# Patient Record
Sex: Female | Born: 1935 | Race: Black or African American | Hispanic: No | Marital: Married | Smoking: Never smoker
Health system: Southern US, Community
[De-identification: ages and names within clinical notes are randomized; demographics above are authoritative.]

## PROBLEM LIST (undated history)

## (undated) DIAGNOSIS — D649 Anemia, unspecified: Secondary | ICD-10-CM

## (undated) DIAGNOSIS — I10 Essential (primary) hypertension: Secondary | ICD-10-CM

## (undated) DIAGNOSIS — N289 Disorder of kidney and ureter, unspecified: Secondary | ICD-10-CM

## (undated) DIAGNOSIS — K219 Gastro-esophageal reflux disease without esophagitis: Secondary | ICD-10-CM

## (undated) DIAGNOSIS — E7800 Pure hypercholesterolemia, unspecified: Secondary | ICD-10-CM

## (undated) HISTORY — PX: TUBAL LIGATION: SHX77

## (undated) HISTORY — DX: Pure hypercholesterolemia, unspecified: E78.00

## (undated) HISTORY — DX: Anemia, unspecified: D64.9

## (undated) HISTORY — PX: TONSILLECTOMY: SUR1361

## (undated) HISTORY — DX: Gastro-esophageal reflux disease without esophagitis: K21.9

## (undated) HISTORY — DX: Disorder of kidney and ureter, unspecified: N28.9

---

## 2009-07-24 ENCOUNTER — Ambulatory Visit: Payer: Self-pay | Admitting: Family Medicine

## 2015-02-02 ENCOUNTER — Encounter: Payer: Self-pay | Admitting: Emergency Medicine

## 2015-02-02 ENCOUNTER — Ambulatory Visit
Admission: EM | Admit: 2015-02-02 | Discharge: 2015-02-02 | Disposition: A | Discharge status: Home / Self Care | Payer: Medicare HMO | Attending: Internal Medicine | Admitting: Internal Medicine

## 2015-02-02 DIAGNOSIS — B372 Candidiasis of skin and nail: Secondary | ICD-10-CM

## 2015-02-02 DIAGNOSIS — J069 Acute upper respiratory infection, unspecified: Secondary | ICD-10-CM | POA: Diagnosis not present

## 2015-02-02 HISTORY — DX: Essential (primary) hypertension: I10

## 2015-02-02 MED ORDER — CLOTRIMAZOLE 1 % EX CREA: TOPICAL_CREAM | CUTANEOUS | Status: DC

## 2015-02-02 MED ORDER — TRIAMCINOLONE ACETONIDE 55 MCG/ACT NA AERO: 2.0000 | INHALATION_SPRAY | Freq: Every day | NASAL | Status: DC

## 2015-02-02 NOTE — Discharge Instructions (Signed)
Prescription for nasacort spray (to help with congestion) and clotrimazole cream (to help with itching/redness under R breast) [facility] [place]. Cough should slowly improve over the next 2 weeks or so. Please be rechecked for new fever >100.5, increasing phlegm production. Rest and push fluids.    Upper Respiratory Infection, Adult An upper respiratory infection (URI) is also known as the common cold. It is often caused by a type of germ (virus). Colds are easily spread (contagious). You can pass it to others by kissing, coughing, sneezing, or drinking out of the same glass. Usually, you get better in 1 or 2 weeks.  HOME CARE   Only take medicine as told by your doctor.  Use a warm mist humidifier or breathe in steam from a hot shower.  Drink enough water and fluids to keep your pee (urine) clear or pale yellow.  Get plenty of rest.  Return to work when your temperature is back to normal or as told by your doctor. You may use a face mask and wash your hands to stop your cold from spreading. GET HELP RIGHT AWAY IF:   After the first few days, you feel you are getting worse.  You have questions about your medicine.  You have chills, shortness of breath, or brown or red spit (mucus).  You have yellow or brown snot (nasal discharge) or pain in the face, especially when you bend forward.  You have a fever, puffy (swollen) neck, pain when you swallow, or white spots in the back of your throat.  You have a bad headache, ear pain, sinus pain, or chest pain.  You have a high-pitched whistling sound when you breathe in and out (wheezing).  You have a lasting cough or cough up blood.  You have sore muscles or a stiff neck. MAKE SURE YOU:   Understand these instructions.  Will watch your condition.  Will get help right away if you are not doing well or get worse. Document Released: 12/31/2007 Document Revised: 10/06/2011 Document Reviewed: 10/19/2013 [facility] Patient  Information 2015 [place], [place]. This information is not intended to replace advice given to you by your health care provider. Make sure you discuss any questions you have with your health care provider.

## 2015-02-02 NOTE — ED Notes (Signed)
Patient c/o cough and chest congestion since Monday.  

## 2015-02-02 NOTE — ED Provider Notes (Signed)
CSN: [id number]     Arrival date & time 02/02/15  5993 History   First MD Initiated Contact with Patient 02/02/15 [phone]     Chief Complaint  Patient presents with  . Cough   HPI  Patient is a 81 year old lady who presents with a 4 day history of cough, runny/congested nose. Cough is occasionally productive, and seems to be improving. She is taking Delsym cough syrup. She has malaise. No nausea, no vomiting. No diarrhea. Not achy. Just doesn't feel good. No smoking history.  Past Medical History  Diagnosis Date  . Hypertension    Past Surgical History  Procedure Laterality Date  . Tubal ligation    . Tonsillectomy     History reviewed. No pertinent family history. History  Substance Use Topics  . Smoking status: Never Smoker   . Smokeless tobacco: Never Used  . Alcohol Use: No    Review of Systems  All other systems reviewed and are negative.   Allergies  Review of patient's allergies indicates no known allergies.  Home Medications   Prior to Admission medications   Medication Sig Start Date End Date Taking? Authorizing Provider  clonazePAM (KLONOPIN) 0.5 MG tablet Take 0.5 mg by mouth 2 (two) times daily as needed for anxiety.   Yes Historical Provider, MD  fenofibrate (TRICOR) 48 MG tablet Take 48 mg by mouth daily.   Yes Historical Provider, MD  hydrochlorothiazide (MICROZIDE) 12.5 MG capsule Take 12.5 mg by mouth daily.   Yes Historical Provider, MD  lisinopril (PRINIVIL,ZESTRIL) 10 MG tablet Take 10 mg by mouth daily.   Yes Historical Provider, MD  Multiple Vitamin (MULTIVITAMIN) tablet Take 1 tablet by mouth daily.   Yes Historical Provider, MD                 BP 159/79 mmHg  Pulse 82  Temp(Src) 96.9 F (36.1 C) (Tympanic)  Resp 16  Ht 5\' 3"  (1.6 m)  Wt 147 lb (66.679 kg)  BMI 26.05 kg/m2  SpO2 100% Physical Exam  Constitutional: She is oriented to person, place, and time. No distress.  Alert, nicely groomed  HENT:  Head: Atraumatic.  Bilateral TMs are  moderately dull, right ear canal is waxy. Marked nasal congestion on the left, and moderate congestion on the right. Throat is little bit red, with postnasal drainage evident  Eyes:  Conjugate gaze, no eye redness/drainage   Neck: Neck supple.  Cardiovascular: Normal rate and regular rhythm.   Pulmonary/Chest: Effort normal. No respiratory distress. She has no wheezes. She has no rales.  Breath sounds slightly coarse, symmetric breath sounds  Abdominal: She exhibits no distension.  Musculoskeletal: Normal range of motion.  No leg swelling  Neurological: She is alert and oriented to person, place, and time.  Skin: Skin is warm and dry.  No cyanosis Patient calls to my attention an itchy red patch, slightly indurated, about 4 x 6", under the right breast. She says it's been there a couple of weeks, and has been expanding since she started using hydrocortisone cream on it. It is not sore. There are some tiny satellite papules, no vesicles.  Nursing note and vitals reviewed.   ED Course  Procedures  No procedures at the urgent care today  MDM   1. Upper respiratory infection   2. Candidal intertrigo    Continue Delsym. [facility] [place] Anticipate gradual improvement in the cough over the next couple of weeks, patient to recheck if she  is having increasing phlegm production, or new fever greater than 100.5. Prescription for clotrimazole cream was sent to the pharmacy for Candida under the right breast.    Sherlene Shams, MD 02/02/15 [phone]

## 2015-11-17 DIAGNOSIS — F419 Anxiety disorder, unspecified: Secondary | ICD-10-CM | POA: Insufficient documentation

## 2015-11-17 DIAGNOSIS — E7800 Pure hypercholesterolemia, unspecified: Secondary | ICD-10-CM | POA: Insufficient documentation

## 2015-11-17 DIAGNOSIS — K219 Gastro-esophageal reflux disease without esophagitis: Secondary | ICD-10-CM | POA: Insufficient documentation

## 2015-11-17 DIAGNOSIS — R0602 Shortness of breath: Secondary | ICD-10-CM | POA: Insufficient documentation

## 2015-11-17 DIAGNOSIS — I10 Essential (primary) hypertension: Secondary | ICD-10-CM | POA: Insufficient documentation

## 2018-08-28 DIAGNOSIS — N289 Disorder of kidney and ureter, unspecified: Secondary | ICD-10-CM | POA: Insufficient documentation

## 2020-09-25 DIAGNOSIS — Z20822 Contact with and (suspected) exposure to covid-19: Secondary | ICD-10-CM | POA: Diagnosis not present

## 2020-09-25 DIAGNOSIS — Z03818 Encounter for observation for suspected exposure to other biological agents ruled out: Secondary | ICD-10-CM | POA: Diagnosis not present

## 2020-09-26 DIAGNOSIS — Z20822 Contact with and (suspected) exposure to covid-19: Secondary | ICD-10-CM | POA: Diagnosis not present

## 2020-09-26 DIAGNOSIS — Z03818 Encounter for observation for suspected exposure to other biological agents ruled out: Secondary | ICD-10-CM | POA: Diagnosis not present

## 2020-10-08 DIAGNOSIS — E538 Deficiency of other specified B group vitamins: Secondary | ICD-10-CM | POA: Diagnosis not present

## 2020-10-08 DIAGNOSIS — J309 Allergic rhinitis, unspecified: Secondary | ICD-10-CM | POA: Diagnosis not present

## 2020-10-08 DIAGNOSIS — Z0001 Encounter for general adult medical examination with abnormal findings: Secondary | ICD-10-CM | POA: Diagnosis not present

## 2020-10-08 DIAGNOSIS — N289 Disorder of kidney and ureter, unspecified: Secondary | ICD-10-CM | POA: Diagnosis not present

## 2020-10-08 DIAGNOSIS — Z13220 Encounter for screening for lipoid disorders: Secondary | ICD-10-CM | POA: Diagnosis not present

## 2020-10-08 DIAGNOSIS — E039 Hypothyroidism, unspecified: Secondary | ICD-10-CM | POA: Diagnosis not present

## 2020-10-08 DIAGNOSIS — R4589 Other symptoms and signs involving emotional state: Secondary | ICD-10-CM | POA: Diagnosis not present

## 2020-10-08 DIAGNOSIS — I10 Essential (primary) hypertension: Secondary | ICD-10-CM | POA: Diagnosis not present

## 2020-11-26 DIAGNOSIS — H40003 Preglaucoma, unspecified, bilateral: Secondary | ICD-10-CM | POA: Diagnosis not present

## 2020-11-26 DIAGNOSIS — H2513 Age-related nuclear cataract, bilateral: Secondary | ICD-10-CM | POA: Diagnosis not present

## 2021-06-24 ENCOUNTER — Ambulatory Visit (INDEPENDENT_AMBULATORY_CARE_PROVIDER_SITE_OTHER): Payer: Medicare Other | Admitting: Internal Medicine

## 2021-06-24 ENCOUNTER — Other Ambulatory Visit: Payer: Self-pay

## 2021-06-24 ENCOUNTER — Encounter: Payer: Self-pay | Admitting: Internal Medicine

## 2021-06-24 VITALS — BP 138/84 | HR 66 | Temp 97.5°F | Resp 16 | Ht 61.5 in | Wt 153.0 lb

## 2021-06-24 DIAGNOSIS — M545 Low back pain, unspecified: Secondary | ICD-10-CM

## 2021-06-24 DIAGNOSIS — E7800 Pure hypercholesterolemia, unspecified: Secondary | ICD-10-CM

## 2021-06-24 DIAGNOSIS — Z86010 Personal history of colonic polyps: Secondary | ICD-10-CM

## 2021-06-24 DIAGNOSIS — I10 Essential (primary) hypertension: Secondary | ICD-10-CM

## 2021-06-24 DIAGNOSIS — D649 Anemia, unspecified: Secondary | ICD-10-CM

## 2021-06-24 DIAGNOSIS — Z862 Personal history of diseases of the blood and blood-forming organs and certain disorders involving the immune mechanism: Secondary | ICD-10-CM | POA: Diagnosis not present

## 2021-06-24 DIAGNOSIS — Z8659 Personal history of other mental and behavioral disorders: Secondary | ICD-10-CM

## 2021-06-24 NOTE — Progress Notes (Addendum)
Patient ID: MACKAYLA MULLINS, female   DOB: 11-14-1933, 87 y.o.   MRN: [id number]   Subjective:    Patient ID: Arnaldo Natal, female    DOB: 1934-07-21, 87 y.o.   MRN: [id number]  This visit occurred during the SARS-CoV-2 public health emergency.  Safety protocols were in place, including screening questions prior to the visit, additional usage of staff PPE, and extensive cleaning of exam room while observing appropriate contact time as indicated for disinfecting solutions.   Patient here for to establish care.    Chief Complaint  Patient presents with   Establish Care   .   HPI She is accompanied by her daughter Sharyn Lull.  History obtained from both of them.  Has been seeing Dr Maeola Sarah.  Lives with another daughter.  Able to do her ADLs. Does report low back discomfort - arthritis.  No chest pain reported.  Breathing stable.  No abdominal pain.  Blood pressures 130s/80s.     Past Medical History:  Diagnosis Date   Anemia    GERD (gastroesophageal reflux disease)    Hypercholesterolemia    Hypertension    Renal insufficiency    Past Surgical History:  Procedure Laterality Date   TONSILLECTOMY     TUBAL LIGATION     Family History  Problem Relation Age of Onset   Hypertension Mother    Arthritis Mother    Social History   Socioeconomic History   Marital status: Married    Spouse name: Not on file   Number of children: Not on file   Years of education: Not on file   Highest education level: Not on file  Occupational History   Not on file  Tobacco Use   Smoking status: Never   Smokeless tobacco: Never  Substance and Sexual Activity   Alcohol use: No   Drug use: No   Sexual activity: Not on file  Other Topics Concern   Not on file  Social History Narrative   Not on file   Social Determinants of Health   Financial Resource Strain: Not on file  Food Insecurity: Not on file  Transportation Needs: Not on file  Physical Activity: Not on file  Stress: Not on file   Social Connections: Not on file     Review of Systems  Constitutional:  Negative for appetite change and unexpected weight change.  HENT:  Negative for congestion and sinus pressure.   Respiratory:  Negative for cough and chest tightness.        Breathing stable.   Cardiovascular:  Negative for chest pain, palpitations and leg swelling.  Gastrointestinal:  Negative for abdominal pain, diarrhea, nausea and vomiting.  Genitourinary:  Negative for difficulty urinating and dysuria.  Musculoskeletal:  Positive for back pain. Negative for joint swelling and myalgias.  Skin:  Negative for color change and rash.  Neurological:  Negative for dizziness, light-headedness and headaches.  Psychiatric/Behavioral:  Negative for agitation and dysphoric mood.       Objective:     BP 138/84   Pulse 66   Temp (!) 97.5 F (36.4 C)   Resp 16   Ht 5' 1.5" (1.562 m)   Wt 153 lb (69.4 kg)   SpO2 98%   BMI 28.44 kg/m  Wt Readings from Last 3 Encounters:  06/24/21 153 lb (69.4 kg)  02/02/15 147 lb (66.7 kg)    Physical Exam Vitals reviewed.  Constitutional:      General: She is not in acute distress.  Appearance: Normal appearance.  HENT:     Head: Normocephalic and atraumatic.     Right Ear: External ear normal.     Left Ear: External ear normal.  Eyes:     General: No scleral icterus.       Right eye: No discharge.        Left eye: No discharge.     Conjunctiva/sclera: Conjunctivae normal.  Neck:     Thyroid: No thyromegaly.  Cardiovascular:     Rate and Rhythm: Normal rate and regular rhythm.  Pulmonary:     Effort: No respiratory distress.     Breath sounds: Normal breath sounds. No wheezing.  Abdominal:     General: Bowel sounds are normal.     Palpations: Abdomen is soft.     Tenderness: There is no abdominal tenderness.  Musculoskeletal:        General: No swelling or tenderness.     Cervical back: Neck supple. No tenderness.  Lymphadenopathy:     Cervical: No  cervical adenopathy.  Skin:    Findings: No erythema or rash.  Neurological:     Mental Status: She is alert.  Psychiatric:        Mood and Affect: Mood normal.        Behavior: Behavior normal.     Outpatient Encounter Medications as of 06/24/2021  Medication Sig   amLODipine (NORVASC) 2.5 MG tablet Take 10 mg by mouth daily.   ascorbic acid (VITAMIN C) 500 MG tablet Take 500 mg by mouth daily.   aspirin 81 MG EC tablet Take 81 mg by mouth daily.   Calcium Carbonate-Vitamin D (OYSTER SHELL CALCIUM/D) 500-5 MG-MCG TABS Take by mouth.   clonazePAM (KLONOPIN) 0.5 MG tablet Take 0.5 mg by mouth 2 (two) times daily as needed for anxiety.   clotrimazole (LOTRIMIN) 1 % cream Apply to affected area 2 times daily   donepezil (ARICEPT) 10 MG tablet Take 10 mg by mouth daily.   fenofibrate (TRICOR) 48 MG tablet Take 48 mg by mouth daily.   ferrous gluconate (FERGON) 324 MG tablet ferrous gluconate 324 mg (38 mg iron) tablet  TAKE 1 TABLET BY MOUTH THREE TIMES A DAY AS DIRECTED   fexofenadine (ALLEGRA) 180 MG tablet Take by mouth.   hydrochlorothiazide (MICROZIDE) 12.5 MG capsule Take 12.5 mg by mouth daily.   losartan (COZAAR) 100 MG tablet Take 100 mg by mouth daily.   memantine (NAMENDA) 5 MG tablet Take 10 mg by mouth 2 (two) times daily.   metoprolol succinate (TOPROL-XL) 25 MG 24 hr tablet Take 50 mg by mouth daily.   Multiple Vitamin (MULTIVITAMIN) tablet Take 1 tablet by mouth daily.   omeprazole (PRILOSEC) 20 MG capsule Take 20 mg by mouth daily.   [DISCONTINUED] lisinopril (PRINIVIL,ZESTRIL) 10 MG tablet Take 10 mg by mouth daily.   [DISCONTINUED] triamcinolone (NASACORT AQ) 55 MCG/ACT AERO nasal inhaler Place 2 sprays into the nose daily.   No facility-administered encounter medications on file as of 06/24/2021.     No results found.     Assessment & Plan:   Problem List Items Addressed This Visit     History of anemia    Check cbc today.  Recent labs revealed decreased  hgb.        Relevant Orders   CBC with Differential/Platelet (Completed)   Basic Metabolic Panel (BMET) (Completed)   History of colon polyps    Has had colonoscopy.  States was told did not need f/u colonoscopy.  History of dementia    On aricept and namenda.  Daughter - request neurology evaluation (formal evaluation).        Relevant Orders   Ambulatory referral to Neurology   Hypercholesterolemia    On tricor.  Check lipid panel.        Relevant Medications   amLODipine (NORVASC) 2.5 MG tablet   aspirin 81 MG EC tablet   losartan (COZAAR) 100 MG tablet   metoprolol succinate (TOPROL-XL) 25 MG 24 hr tablet   Other Relevant Orders   Basic Metabolic Panel (BMET) (Completed)   Lipid panel (Completed)   TSH (Completed)   Hepatic function panel (Completed)   Hypertension, essential    Blood pressure as outlined - outside readings averaging 130s/80s.  Continue toprol, amlodipine, microzide and losartan.  Continue current medication regimen.  Follow pressures.  Follow metabolic panel.       Relevant Medications   amLODipine (NORVASC) 2.5 MG tablet   aspirin 81 MG EC tablet   losartan (COZAAR) 100 MG tablet   metoprolol succinate (TOPROL-XL) 25 MG 24 hr tablet   Other Relevant Orders   Basic Metabolic Panel (BMET) (Completed)   Low back pain    Low back pain as outlined. Appears to be worse when first gets up.  Better as moving.  Follow.       Relevant Medications   aspirin 81 MG EC tablet   Other Visit Diagnoses     Anemia, unspecified type    -  Primary   Relevant Medications   ferrous gluconate (FERGON) 324 MG tablet   Other Relevant Orders   IBC + Ferritin   Basic Metabolic Panel (BMET) (Completed)   Vitamin B12 (Completed)   Ferritin (Completed)   Iron Binding Cap (TIBC)([facility]) (Completed)        Einar Pheasant, MD

## 2021-06-25 LAB — CBC WITH DIFFERENTIAL/PLATELET
Basophils Absolute: 0 10*3/uL (ref 0.0–0.1)
Basophils Relative: 1 % (ref 0.0–3.0)
Eosinophils Absolute: 0.1 10*3/uL (ref 0.0–0.7)
Eosinophils Relative: 1.4 % (ref 0.0–5.0)
HCT: 24.4 % — ABNORMAL LOW (ref 36.0–46.0)
Hemoglobin: 8.1 g/dL — ABNORMAL LOW (ref 12.0–15.0)
Lymphocytes Relative: 31.1 % (ref 12.0–46.0)
Lymphs Abs: 1.3 10*3/uL (ref 0.7–4.0)
MCHC: 33.4 g/dL (ref 30.0–36.0)
MCV: 95.9 fl (ref 78.0–100.0)
Monocytes Absolute: 0.3 10*3/uL (ref 0.1–1.0)
Monocytes Relative: 7.5 % (ref 3.0–12.0)
Neutro Abs: 2.5 10*3/uL (ref 1.4–7.7)
Neutrophils Relative %: 59 % (ref 43.0–77.0)
Platelets: 191 10*3/uL (ref 150.0–400.0)
RBC: 2.55 Mil/uL — ABNORMAL LOW (ref 3.87–5.11)
RDW: 15.7 % — ABNORMAL HIGH (ref 11.5–15.5)
WBC: 4.2 10*3/uL (ref 4.0–10.5)

## 2021-06-26 ENCOUNTER — Telehealth: Payer: Self-pay | Admitting: *Deleted

## 2021-06-26 NOTE — Telephone Encounter (Signed)
My apologies for the late message, We received a call from [facility] lab notifying us that there was a clotting/coagulation issue with her serum & that she needs to be recollected.   I have corrected the labs so that the patient can have them drawn at her nearest outpatient [place] location tomorrow.

## 2021-06-27 ENCOUNTER — Other Ambulatory Visit
Admission: RE | Admit: 2021-06-27 | Discharge: 2021-06-27 | Disposition: A | Discharge status: Home / Self Care | Payer: Medicare Other | Attending: Internal Medicine | Admitting: Internal Medicine

## 2021-06-27 ENCOUNTER — Other Ambulatory Visit: Payer: Self-pay

## 2021-06-27 DIAGNOSIS — E7800 Pure hypercholesterolemia, unspecified: Secondary | ICD-10-CM | POA: Diagnosis not present

## 2021-06-27 DIAGNOSIS — Z862 Personal history of diseases of the blood and blood-forming organs and certain disorders involving the immune mechanism: Secondary | ICD-10-CM | POA: Diagnosis not present

## 2021-06-27 DIAGNOSIS — I10 Essential (primary) hypertension: Secondary | ICD-10-CM | POA: Diagnosis not present

## 2021-06-27 DIAGNOSIS — D649 Anemia, unspecified: Secondary | ICD-10-CM | POA: Insufficient documentation

## 2021-06-27 LAB — HEPATIC FUNCTION PANEL
ALT: 13 U/L (ref 0–44)
AST: 20 U/L (ref 15–41)
Albumin: 2.8 g/dL — ABNORMAL LOW (ref 3.5–5.0)
Alkaline Phosphatase: 39 U/L (ref 38–126)
Bilirubin, Direct: <0.1 mg/dL (ref 0.0–0.2)
Total Bilirubin: 0.5 mg/dL (ref 0.3–1.2)
Total Protein: 9.8 g/dL — ABNORMAL HIGH (ref 6.5–8.1)

## 2021-06-27 LAB — IRON AND TIBC
Iron: 58 ug/dL (ref 28–170)
Saturation Ratios: 18 % (ref 10.4–31.8)
TIBC: 321 ug/dL (ref 250–450)
UIBC: 263 ug/dL

## 2021-06-27 LAB — LIPID PANEL
Cholesterol: 115 mg/dL (ref 0–200)
HDL: 51 mg/dL (ref >40)
LDL Cholesterol: 57 mg/dL (ref 0–99)
Total CHOL/HDL Ratio: 2.3 RATIO
Triglycerides: 34 mg/dL (ref <150)
VLDL: 7 mg/dL (ref 0–40)

## 2021-06-27 LAB — BASIC METABOLIC PANEL
Anion gap: 11 (ref 5–15)
BUN: 19 mg/dL (ref 8–23)
CO2: 25 mmol/L (ref 22–32)
Calcium: 10.1 mg/dL (ref 8.9–10.3)
Chloride: 106 mmol/L (ref 98–111)
Creatinine, Ser: 1.2 mg/dL — ABNORMAL HIGH (ref 0.44–1.00)
GFR, Estimated: 44 mL/min — ABNORMAL LOW (ref >60)
Glucose, Bld: 90 mg/dL (ref 70–99)
Potassium: 3.8 mmol/L (ref 3.5–5.1)
Sodium: 142 mmol/L (ref 135–145)

## 2021-06-27 LAB — FERRITIN: Ferritin: 93 ng/mL (ref 11–307)

## 2021-06-27 LAB — VITAMIN B12: Vitamin B-12: 307 pg/mL (ref 180–914)

## 2021-06-27 LAB — TSH: TSH: 0.784 u[IU]/mL (ref 0.350–4.500)

## 2021-06-29 ENCOUNTER — Encounter: Payer: Self-pay | Admitting: Internal Medicine

## 2021-06-29 DIAGNOSIS — M545 Low back pain, unspecified: Secondary | ICD-10-CM | POA: Insufficient documentation

## 2021-06-29 DIAGNOSIS — Z8659 Personal history of other mental and behavioral disorders: Secondary | ICD-10-CM | POA: Insufficient documentation

## 2021-06-29 DIAGNOSIS — Z86010 Personal history of colonic polyps: Secondary | ICD-10-CM | POA: Insufficient documentation

## 2021-06-29 NOTE — Assessment & Plan Note (Signed)
Check cbc today.  Recent labs revealed decreased hgb.

## 2021-06-29 NOTE — Assessment & Plan Note (Signed)
Blood pressure as outlined - outside readings averaging 130s/80s.  Continue toprol, amlodipine, microzide and losartan.  Continue current medication regimen.  Follow pressures.  Follow metabolic panel.

## 2021-06-29 NOTE — Assessment & Plan Note (Signed)
On tricor.  Check lipid panel.   

## 2021-06-29 NOTE — Assessment & Plan Note (Signed)
On aricept and namenda.  Daughter - request neurology evaluation (formal evaluation).

## 2021-06-29 NOTE — Assessment & Plan Note (Signed)
Low back pain as outlined. Appears to be worse when first gets up.  Better as moving.  Follow.

## 2021-06-29 NOTE — Addendum Note (Signed)
Addended by: Alisa Graff on: 06/29/2021 08:04 PM   Modules accepted: Orders

## 2021-06-29 NOTE — Assessment & Plan Note (Signed)
Has had colonoscopy.  States was told did not need f/u colonoscopy.

## 2021-07-01 ENCOUNTER — Other Ambulatory Visit: Payer: Medicare Other

## 2021-07-04 ENCOUNTER — Other Ambulatory Visit: Payer: Medicare Other

## 2021-07-10 ENCOUNTER — Other Ambulatory Visit: Payer: Self-pay | Admitting: Internal Medicine

## 2021-07-10 DIAGNOSIS — D649 Anemia, unspecified: Secondary | ICD-10-CM

## 2021-07-10 DIAGNOSIS — R944 Abnormal results of kidney function studies: Secondary | ICD-10-CM

## 2021-07-10 NOTE — Progress Notes (Signed)
Order placed for f/u lab.   

## 2021-07-15 ENCOUNTER — Telehealth: Payer: Self-pay | Admitting: *Deleted

## 2021-07-15 ENCOUNTER — Other Ambulatory Visit: Payer: Self-pay | Admitting: Internal Medicine

## 2021-07-15 ENCOUNTER — Telehealth: Payer: Self-pay | Admitting: Internal Medicine

## 2021-07-15 ENCOUNTER — Other Ambulatory Visit (INDEPENDENT_AMBULATORY_CARE_PROVIDER_SITE_OTHER): Payer: Medicare Other

## 2021-07-15 ENCOUNTER — Other Ambulatory Visit: Payer: Self-pay

## 2021-07-15 DIAGNOSIS — D649 Anemia, unspecified: Secondary | ICD-10-CM | POA: Diagnosis not present

## 2021-07-15 DIAGNOSIS — R944 Abnormal results of kidney function studies: Secondary | ICD-10-CM | POA: Diagnosis not present

## 2021-07-15 DIAGNOSIS — N289 Disorder of kidney and ureter, unspecified: Secondary | ICD-10-CM

## 2021-07-15 LAB — BASIC METABOLIC PANEL
BUN: 16 mg/dL (ref 6–23)
CO2: 24 mEq/L (ref 19–32)
Calcium: 10.2 mg/dL (ref 8.4–10.5)
Chloride: 103 mEq/L (ref 96–112)
Creatinine, Ser: 1.09 mg/dL (ref 0.40–1.20)
GFR: 46.32 mL/min — ABNORMAL LOW (ref >60.00)
Glucose, Bld: 84 mg/dL (ref 70–99)
Potassium: 3.6 mEq/L (ref 3.5–5.1)
Sodium: 143 mEq/L (ref 135–145)

## 2021-07-15 LAB — CBC WITH DIFFERENTIAL/PLATELET
Basophils Absolute: 0 10*3/uL (ref 0.0–0.1)
Basophils Relative: 0.8 % (ref 0.0–3.0)
Eosinophils Absolute: 0.1 10*3/uL (ref 0.0–0.7)
Eosinophils Relative: 2 % (ref 0.0–5.0)
HCT: 22.9 % — CL (ref 36.0–46.0)
Hemoglobin: 7.7 g/dL — CL (ref 12.0–15.0)
Lymphocytes Relative: 33.6 % (ref 12.0–46.0)
Lymphs Abs: 1.2 10*3/uL (ref 0.7–4.0)
MCHC: 33.5 g/dL (ref 30.0–36.0)
MCV: 96.8 fl (ref 78.0–100.0)
Monocytes Absolute: 0.3 10*3/uL (ref 0.1–1.0)
Monocytes Relative: 8.1 % (ref 3.0–12.0)
Neutro Abs: 2 10*3/uL (ref 1.4–7.7)
Neutrophils Relative %: 55.5 % (ref 43.0–77.0)
Platelets: 166 10*3/uL (ref 150.0–400.0)
RBC: 2.37 Mil/uL — ABNORMAL LOW (ref 3.87–5.11)
RDW: 16.5 % — ABNORMAL HIGH (ref 11.5–15.5)
WBC: 3.6 10*3/uL — ABNORMAL LOW (ref 4.0–10.5)

## 2021-07-15 NOTE — Telephone Encounter (Signed)
Called and spoke to Ms Castello and her daughter Levada Dy.  Notified of lab results and need for further w/up and evaluation.  Discussed the need for hematology referral.  Agreeable to referral.  Denies any acute symptoms.  Specifically denies any sob, chest pain, or other acute symptoms.  Order placed for referral.  Hematology to contact for work in appt.

## 2021-07-15 NOTE — Telephone Encounter (Signed)
CRITICAL VALUE STICKER  CRITICAL VALUE: HGB 7.7  RECEIVER (on-site recipient of call): Sharee Pimple  DATE & TIME NOTIFIED: 07/15/2021 2:29pm  MESSENGER (representative from lab): Santiago Glad  MD NOTIFIED: Dr.Scott  TIME OF NOTIFICATION: 2:30pm  RESPONSE:

## 2021-07-15 NOTE — Telephone Encounter (Signed)
Lidia Collum to Ms Bossard and her daughter Levada Dy.  Discussed labs.  Explained need for further w/up.  Discussed referral to hematology.  They are agreeable.  Order placed for referral.  Discussed with Dr Tasia Catchings.  Hematology to call pt with appt.  They will follow up with labs through hematology.  No need to reschedule here.

## 2021-07-15 NOTE — Addendum Note (Signed)
Addended by: Leeanne Rio on: 07/15/2021 01:31 PM   Modules accepted: Orders

## 2021-07-15 NOTE — Progress Notes (Signed)
Order placed for hematology referral.  

## 2021-07-15 NOTE — Telephone Encounter (Signed)
Per reviewing chart, do you want to reorder everything that we drew here since was abnormal?

## 2021-07-15 NOTE — Telephone Encounter (Signed)
Pt came in this morning for labs. Three tubes were collected however, the tube for the Multiple Myeloma will not separate the serum after three attempts. Her blood hemolyzed and will need to be recollected. The other two test will be ran at [facility] and should result today if they have no problems.

## 2021-07-15 NOTE — Telephone Encounter (Signed)
Will need to be recollected.  See me if any problems.

## 2021-07-16 NOTE — Telephone Encounter (Signed)
Noted  

## 2021-07-24 ENCOUNTER — Inpatient Hospital Stay: Payer: Medicare Other | Attending: Oncology | Admitting: Oncology

## 2021-07-24 ENCOUNTER — Other Ambulatory Visit: Payer: Self-pay

## 2021-07-24 ENCOUNTER — Encounter: Payer: Self-pay | Admitting: Oncology

## 2021-07-24 ENCOUNTER — Inpatient Hospital Stay (HOSPITAL_BASED_OUTPATIENT_CLINIC_OR_DEPARTMENT_OTHER): Payer: Medicare Other | Admitting: Oncology

## 2021-07-24 VITALS — BP 160/72 | HR 68 | Temp 98.3°F | Resp 18 | Wt 147.0 lb

## 2021-07-24 DIAGNOSIS — D631 Anemia in chronic kidney disease: Secondary | ICD-10-CM | POA: Diagnosis not present

## 2021-07-24 DIAGNOSIS — D649 Anemia, unspecified: Secondary | ICD-10-CM

## 2021-07-24 DIAGNOSIS — E7800 Pure hypercholesterolemia, unspecified: Secondary | ICD-10-CM | POA: Diagnosis not present

## 2021-07-24 DIAGNOSIS — Z823 Family history of stroke: Secondary | ICD-10-CM

## 2021-07-24 DIAGNOSIS — Z79899 Other long term (current) drug therapy: Secondary | ICD-10-CM | POA: Diagnosis not present

## 2021-07-24 DIAGNOSIS — Z803 Family history of malignant neoplasm of breast: Secondary | ICD-10-CM

## 2021-07-24 DIAGNOSIS — R779 Abnormality of plasma protein, unspecified: Secondary | ICD-10-CM

## 2021-07-24 DIAGNOSIS — Z8261 Family history of arthritis: Secondary | ICD-10-CM

## 2021-07-24 DIAGNOSIS — I129 Hypertensive chronic kidney disease with stage 1 through stage 4 chronic kidney disease, or unspecified chronic kidney disease: Secondary | ICD-10-CM | POA: Insufficient documentation

## 2021-07-24 DIAGNOSIS — Z8249 Family history of ischemic heart disease and other diseases of the circulatory system: Secondary | ICD-10-CM | POA: Diagnosis not present

## 2021-07-24 DIAGNOSIS — N1831 Chronic kidney disease, stage 3a: Secondary | ICD-10-CM | POA: Diagnosis not present

## 2021-07-24 DIAGNOSIS — R778 Other specified abnormalities of plasma proteins: Secondary | ICD-10-CM

## 2021-07-24 LAB — COMPREHENSIVE METABOLIC PANEL
ALT: 11 U/L (ref 0–44)
AST: 20 U/L (ref 15–41)
Albumin: 2.8 g/dL — ABNORMAL LOW (ref 3.5–5.0)
Alkaline Phosphatase: 51 U/L (ref 38–126)
Anion gap: 17 — ABNORMAL HIGH (ref 5–15)
BUN: 23 mg/dL (ref 8–23)
CO2: 23 mmol/L (ref 22–32)
Calcium: 10.2 mg/dL (ref 8.9–10.3)
Chloride: 101 mmol/L (ref 98–111)
Creatinine, Ser: 1.13 mg/dL — ABNORMAL HIGH (ref 0.44–1.00)
GFR, Estimated: 48 mL/min — ABNORMAL LOW (ref >60)
Glucose, Bld: 88 mg/dL (ref 70–99)
Potassium: 3.5 mmol/L (ref 3.5–5.1)
Sodium: 141 mmol/L (ref 135–145)
Total Bilirubin: 0.2 mg/dL — ABNORMAL LOW (ref 0.3–1.2)
Total Protein: 10.2 g/dL — ABNORMAL HIGH (ref 6.5–8.1)

## 2021-07-24 LAB — CBC WITH DIFFERENTIAL/PLATELET
Abs Immature Granulocytes: 0.01 10*3/uL (ref 0.00–0.07)
Basophils Absolute: 0 10*3/uL (ref 0.0–0.1)
Basophils Relative: 1 %
Eosinophils Absolute: 0.1 10*3/uL (ref 0.0–0.5)
Eosinophils Relative: 2 %
HCT: 23.5 % — ABNORMAL LOW (ref 36.0–46.0)
Hemoglobin: 7.4 g/dL — ABNORMAL LOW (ref 12.0–15.0)
Immature Granulocytes: 0 %
Lymphocytes Relative: 31 %
Lymphs Abs: 1.3 10*3/uL (ref 0.7–4.0)
MCH: 31 pg (ref 26.0–34.0)
MCHC: 31.5 g/dL (ref 30.0–36.0)
MCV: 98.3 fL (ref 80.0–100.0)
Monocytes Absolute: 0.3 10*3/uL (ref 0.1–1.0)
Monocytes Relative: 7 %
Neutro Abs: 2.3 10*3/uL (ref 1.7–7.7)
Neutrophils Relative %: 59 %
Platelets: 175 10*3/uL (ref 150–400)
RBC: 2.39 MIL/uL — ABNORMAL LOW (ref 3.87–5.11)
RDW: 16.4 % — ABNORMAL HIGH (ref 11.5–15.5)
WBC: 4 10*3/uL (ref 4.0–10.5)
nRBC: 0 % (ref 0.0–0.2)

## 2021-07-24 LAB — IRON AND TIBC
Iron: 54 ug/dL (ref 28–170)
Saturation Ratios: 20 % (ref 10.4–31.8)
TIBC: 277 ug/dL (ref 250–450)
UIBC: 223 ug/dL

## 2021-07-24 LAB — RETIC PANEL
Immature Retic Fract: 11.1 % (ref 2.3–15.9)
RBC.: 2.39 MIL/uL — ABNORMAL LOW (ref 3.87–5.11)
Retic Count, Absolute: 19.8 10*3/uL (ref 19.0–186.0)
Retic Ct Pct: 0.8 % (ref 0.4–3.1)
Reticulocyte Hemoglobin: 33.6 pg (ref >27.9)

## 2021-07-24 LAB — SAMPLE TO BLOOD BANK

## 2021-07-24 LAB — PATHOLOGIST SMEAR REVIEW

## 2021-07-24 LAB — LACTATE DEHYDROGENASE: LDH: 118 U/L (ref 98–192)

## 2021-07-24 LAB — FOLATE: Folate: 16.8 ng/mL (ref >5.9)

## 2021-07-24 LAB — TSH: TSH: 0.346 u[IU]/mL — ABNORMAL LOW (ref 0.350–4.500)

## 2021-07-24 LAB — FERRITIN: Ferritin: 93 ng/mL (ref 11–307)

## 2021-07-24 LAB — ABO/RH: ABO/RH(D): O POS

## 2021-07-24 NOTE — Progress Notes (Signed)
Hematology/Oncology Consult note [phone] [phone]      Patient Care Team: Einar Pheasant, MD as PCP - General (Internal Medicine)  REFERRING PROVIDER: Einar Pheasant, MD  CHIEF COMPLAINTS/REASON FOR VISIT:  Evaluation of anemia  HISTORY OF PRESENTING ILLNESS:   Stephanie Delacruz is a  87 y.o.  female with PMH listed below was seen in consultation at the request of  Einar Pheasant, MD  for evaluation of anemia 07/15/2021, patient had CBC checked at the primary care provider's office.  CBC showed hemoglobin 7.7, hematocrit 23.9, white count 3.6, RDW 16.5.  Platelet count 166. Reviewed previous lab records.  Patient has chronic anemia dating back at least 2016. Her baseline prior to 2018 was about 10.   Hemoglobin was 9.1 in March 2022, and further dropped to 8.1 in October 2022, then November dropped to 7.7. Patient denies being significantly affected, tired, lightheadedness, shortness of breath.  Appetite is fair.  Denies any black or bloody stool.   Denies any history of hemoglobinopathy  Review of Systems  Constitutional:  Negative for appetite change, chills, fatigue and fever.  HENT:   Negative for hearing loss and voice change.   Eyes:  Negative for eye problems.  Respiratory:  Negative for chest tightness and cough.   Cardiovascular:  Negative for chest pain.  Gastrointestinal:  Negative for abdominal distention, abdominal pain and blood in stool.  Endocrine: Negative for hot flashes.  Genitourinary:  Negative for difficulty urinating and frequency.   Musculoskeletal:  Negative for arthralgias.  Skin:  Negative for itching and rash.  Neurological:  Negative for extremity weakness.  Hematological:  Negative for adenopathy.  Psychiatric/Behavioral:  Negative for confusion.    MEDICAL HISTORY:  Past Medical History:  Diagnosis Date   Anemia    GERD (gastroesophageal reflux disease)    Hypercholesterolemia    Hypertension    Renal insufficiency      SURGICAL HISTORY: Past Surgical History:  Procedure Laterality Date   TONSILLECTOMY     TUBAL LIGATION      SOCIAL HISTORY: Social History   Socioeconomic History   Marital status: Married    Spouse name: Not on file   Number of children: Not on file   Years of education: Not on file   Highest education level: Not on file  Occupational History   Not on file  Tobacco Use   Smoking status: Never   Smokeless tobacco: Never  Substance and Sexual Activity   Alcohol use: No   Drug use: No   Sexual activity: Not on file  Other Topics Concern   Not on file  Social History Narrative   Not on file   Social Determinants of Health   Financial Resource Strain: Not on file  Food Insecurity: Not on file  Transportation Needs: Not on file  Physical Activity: Not on file  Stress: Not on file  Social Connections: Not on file  Intimate Partner Violence: Not on file    FAMILY HISTORY: Family History  Problem Relation Age of Onset   Hypertension Mother    Arthritis Mother    Hypertension Father    Stroke Father    Breast cancer Niece     ALLERGIES:  has No Known Allergies.  MEDICATIONS:  Current Outpatient Medications  Medication Sig Dispense Refill   amLODipine (NORVASC) 2.5 MG tablet Take 10 mg by mouth daily.     ascorbic acid (VITAMIN C) 500 MG tablet Take 500 mg by mouth daily.  aspirin 81 MG EC tablet Take 81 mg by mouth daily.     Calcium Carbonate-Vitamin D (OYSTER SHELL CALCIUM/D) 500-5 MG-MCG TABS Take by mouth.     clonazePAM (KLONOPIN) 0.5 MG tablet Take 0.5 mg by mouth 2 (two) times daily as needed for anxiety.     donepezil (ARICEPT) 10 MG tablet Take 10 mg by mouth daily.     fenofibrate (TRICOR) 48 MG tablet Take 48 mg by mouth daily.     ferrous gluconate (FERGON) 324 MG tablet ferrous gluconate 324 mg (38 mg iron) tablet  TAKE 1 TABLET BY MOUTH THREE TIMES A DAY AS DIRECTED     fexofenadine (ALLEGRA) 180 MG tablet Take by mouth.     fluticasone  (FLONASE) 50 MCG/ACT nasal spray Place into both nostrils daily.     hydrochlorothiazide (MICROZIDE) 12.5 MG capsule Take 12.5 mg by mouth daily.     losartan (COZAAR) 100 MG tablet Take 100 mg by mouth daily.     memantine (NAMENDA) 5 MG tablet Take 10 mg by mouth 2 (two) times daily.     metoprolol succinate (TOPROL-XL) 25 MG 24 hr tablet Take 50 mg by mouth daily.     Multiple Vitamin (MULTIVITAMIN) tablet Take 1 tablet by mouth daily.     omeprazole (PRILOSEC) 20 MG capsule Take 20 mg by mouth daily.     No current facility-administered medications for this visit.     PHYSICAL EXAMINATION: ECOG PERFORMANCE STATUS: 1 - Symptomatic but completely ambulatory Vitals:   07/24/21 1115  BP: (!) 160/72  Pulse: 68  Resp: 18  Temp: 98.3 F (36.8 C)   Filed Weights   07/24/21 1115  Weight: 147 lb (66.7 kg)    Physical Exam Constitutional:      General: She is not in acute distress. HENT:     Head: Normocephalic and atraumatic.  Eyes:     General: No scleral icterus. Cardiovascular:     Rate and Rhythm: Normal rate and regular rhythm.     Heart sounds: Normal heart sounds.  Pulmonary:     Effort: Pulmonary effort is normal. No respiratory distress.     Breath sounds: No wheezing.  Abdominal:     General: Bowel sounds are normal. There is no distension.     Palpations: Abdomen is soft.  Musculoskeletal:        General: No deformity. Normal range of motion.     Cervical back: Normal range of motion and neck supple.  Skin:    General: Skin is warm and dry.     Findings: No erythema or rash.  Neurological:     Mental Status: She is alert and oriented to person, place, and time. Mental status is at baseline.     Cranial Nerves: No cranial nerve deficit.     Coordination: Coordination normal.  Psychiatric:        Mood and Affect: Mood normal.    LABORATORY DATA:  I have reviewed the data as listed Lab Results  Component Value Date   WBC 4.0 07/24/2021   HGB 7.4 (L)  07/24/2021   HCT 23.5 (L) 07/24/2021   MCV 98.3 07/24/2021   PLT 175 07/24/2021   Recent Labs    06/27/21 0940 07/15/21 1117 07/24/21 1212  NA 142 143 141  K 3.8 3.6 3.5  CL 106 103 101  CO2 '25 24 23  ' GLUCOSE 90 84 88  BUN '19 16 23  ' CREATININE 1.20* 1.09 1.13*  CALCIUM 10.1 10.2 10.2  GFRNONAA 44*  --  48*  PROT 9.8*  --  10.2*  ALBUMIN 2.8*  --  2.8*  AST 20  --  20  ALT 13  --  11  ALKPHOS 39  --  51  BILITOT 0.5  --  0.2*  BILIDIR <0.1  --   --   IBILI NOT CALCULATED  --   --    Iron/TIBC/Ferritin/ %Sat    Component Value Date/Time   IRON 54 07/24/2021 1212   TIBC 277 07/24/2021 1212   FERRITIN 93 07/24/2021 1212   IRONPCTSAT 20 07/24/2021 1212      RADIOGRAPHIC STUDIES: I have personally reviewed the radiological images as listed and agreed with the findings in the report. No results found.    ASSESSMENT & PLAN:  1. Normocytic anemia   2. Elevated total protein   3. Anemia in stage 3a chronic kidney disease ([place])   4. Hypercalcemia    #Acute on chronic anemia. Labs are reviewed and discussed with patient. Recommend to check anemia work-up. CBC, reticulocyte panel, pathological smear review, hold tube, haptoglobin, MMA, LDH, multiple myeloma panel, light chain ratio, TSH, iron TIBC ferritin folate, CMP  Some labs are resulted at the time of dictation.  Patient has hemoglobin of 7.7, creatinine 1.13, EGFR 48, consistent with chronic kidney disease stage III.  Increased total protein level. Pathology review showed marked rouleaux formation, no immature granulocyte and blood*identified. Multiple myeloma work-up is pending Calcium level is at 10.2, patient has a decreased albumin level.  Corrected calcium is 11.2.  Hypercalcemia. Will advise patient to stop calcium supplementation.  Suspect plasma cell disorders. We will check skeletal survey.  Orders Placed This Encounter  Procedures   CBC with Differential/Platelet    Standing Status:   Future     Number of Occurrences:   1    Standing Expiration Date:   07/24/2022   Comprehensive metabolic panel    Standing Status:   Future    Number of Occurrences:   1    Standing Expiration Date:   07/24/2022   Folate    Standing Status:   Future    Number of Occurrences:   1    Standing Expiration Date:   07/24/2022   Ferritin    Standing Status:   Future    Number of Occurrences:   1    Standing Expiration Date:   01/22/2022   Iron and TIBC    Standing Status:   Future    Number of Occurrences:   1    Standing Expiration Date:   07/24/2022   TSH    Standing Status:   Future    Number of Occurrences:   1    Standing Expiration Date:   07/24/2022   Pathologist smear review    Standing Status:   Future    Number of Occurrences:   1    Standing Expiration Date:   07/24/2022   Retic Panel    Standing Status:   Future    Number of Occurrences:   1    Standing Expiration Date:   07/24/2022   Multiple Myeloma Panel (SPEP&IFE w/QIG)    Standing Status:   Future    Number of Occurrences:   1    Standing Expiration Date:   07/24/2022   Kappa/lambda light chains    Standing Status:   Future    Number of Occurrences:   1    Standing Expiration Date:   07/24/2022   Lactate  dehydrogenase    Standing Status:   Future    Number of Occurrences:   1    Standing Expiration Date:   07/24/2022   Methylmalonic acid, serum    Standing Status:   Future    Number of Occurrences:   1    Standing Expiration Date:   07/24/2022   Haptoglobin    Standing Status:   Future    Number of Occurrences:   1    Standing Expiration Date:   07/24/2022   Hold Tube- Blood Bank    Standing Status:   Future    Number of Occurrences:   1    Standing Expiration Date:   07/24/2022   ABO/Rh    Standing Status:   Future    Number of Occurrences:   1    Standing Expiration Date:   07/24/2022    All questions were answered. The patient knows to call the clinic with any problems questions or concerns.  cc Einar Pheasant, MD    Return of visit: January 2023 to go over results. Thank you for this kind referral and the opportunity to participate in the care of this patient. A copy of today's note is routed to referring provider   Earlie Server, MD, PhD [facility] Hematology Oncology 07/24/2021

## 2021-07-24 NOTE — Progress Notes (Signed)
Pt here to establish care for anemia.

## 2021-07-25 ENCOUNTER — Telehealth: Payer: Self-pay

## 2021-07-25 LAB — KAPPA/LAMBDA LIGHT CHAINS
Kappa free light chain: 89.2 mg/L — ABNORMAL HIGH (ref 3.3–19.4)
Kappa, lambda light chain ratio: 13.31 — ABNORMAL HIGH (ref 0.26–1.65)
Lambda free light chains: 6.7 mg/L (ref 5.7–26.3)

## 2021-07-25 LAB — HAPTOGLOBIN: Haptoglobin: 119 mg/dL (ref 41–333)

## 2021-07-25 NOTE — Telephone Encounter (Signed)
-----   Message from Earlie Server, MD sent at 07/24/2021 10:05 PM EST ----- Please let patient and her daughter know that her calculated calcium level is high.  I recommend patient to stop calcium supplementation.  Please arrange patient to get skeletal survey. Many labs are still pending at this point.  I will keep them updated if any immediate intervention is needed.  Otherwise I will discuss results at the next visit.

## 2021-07-25 NOTE — Telephone Encounter (Signed)
Spoke to patient and her daughter Levada Dy regarding MD recommendations. They voiced understanding. Order for skeletal survey entered.

## 2021-07-26 ENCOUNTER — Ambulatory Visit
Admission: RE | Admit: 2021-07-26 | Discharge: 2021-07-26 | Disposition: A | Discharge status: Home / Self Care | Payer: Medicare Other | Attending: Oncology | Admitting: Oncology

## 2021-07-26 ENCOUNTER — Ambulatory Visit
Admission: RE | Admit: 2021-07-26 | Discharge: 2021-07-26 | Disposition: A | Discharge status: Home / Self Care | Payer: Medicare Other | Source: Ambulatory Visit | Attending: Oncology | Admitting: Oncology

## 2021-07-26 LAB — METHYLMALONIC ACID, SERUM: Methylmalonic Acid, Quantitative: 137 nmol/L (ref 0–378)

## 2021-07-30 LAB — MULTIPLE MYELOMA PANEL, SERUM
Albumin SerPl Elph-Mcnc: 3.3 g/dL (ref 2.9–4.4)
Albumin/Glob SerPl: 0.5 — ABNORMAL LOW (ref 0.7–1.7)
Alpha 1: 0.3 g/dL (ref 0.0–0.4)
Alpha2 Glob SerPl Elph-Mcnc: 0.7 g/dL (ref 0.4–1.0)
B-Globulin SerPl Elph-Mcnc: 5.4 g/dL — ABNORMAL HIGH (ref 0.7–1.3)
Gamma Glob SerPl Elph-Mcnc: 0.6 g/dL (ref 0.4–1.8)
Globulin, Total: 6.9 g/dL — ABNORMAL HIGH (ref 2.2–3.9)
IgA: 4327 mg/dL — ABNORMAL HIGH (ref 64–422)
IgG (Immunoglobin G), Serum: 478 mg/dL — ABNORMAL LOW (ref 586–1602)
IgM (Immunoglobulin M), Srm: 11 mg/dL — ABNORMAL LOW (ref 26–217)
M Protein SerPl Elph-Mcnc: 5.2 g/dL — ABNORMAL HIGH
Total Protein ELP: 10.2 g/dL — ABNORMAL HIGH (ref 6.0–8.5)

## 2021-08-06 ENCOUNTER — Telehealth: Payer: Self-pay

## 2021-08-06 DIAGNOSIS — C9000 Multiple myeloma not having achieved remission: Secondary | ICD-10-CM

## 2021-08-06 NOTE — Telephone Encounter (Signed)
-----  Message from Earlie Server, MD sent at 08/05/2021 10:29 PM EST ----- Please arrange him to get STAT bone marrow biopsy, reason is multiple myeloma.  Also get PET scan whole body ASAP, same reason. Keep same follow up appt.

## 2021-08-06 NOTE — Telephone Encounter (Signed)
Pt scheduled for Bmbx on 1/12 @ 9am. Pt and daughter Levada Dy informed of Bmbx and PET scan appts.

## 2021-08-06 NOTE — Telephone Encounter (Signed)
Orders in for PET scan and BMB. Faxed BMB form over to get patient scheduled stat.   A[place], please schedule patient PET scan asap. Thanks

## 2021-08-06 NOTE — Progress Notes (Signed)
Phone note created 

## 2021-08-07 ENCOUNTER — Other Ambulatory Visit: Payer: Self-pay | Admitting: Radiology

## 2021-08-07 NOTE — Progress Notes (Signed)
Called patient at [phone] on 08/07/2021 at 2:25pm and spoke with patient and daughter, Levada Dy regarding scheduled procedure of bone marrow biopsy Thursday 08/08/2021. Discussed: Arrival time of 0800 to [facility] registration in the [place] and patient and daughter verbalized arrival time of 0800; Medication details: patient states she takes all her medications in the evening and will take them tonight, NPO after midnight tonight 08/07/2021, Need of responsible driver to drive home (driver will be daughter Levada Dy) and husband is also accompanying her, No valuables, Comfortable clothing and patient verbalized again the arrival time of 0800, and verbalized understanding of instructions.  Questions answered.

## 2021-08-08 ENCOUNTER — Ambulatory Visit
Admission: RE | Admit: 2021-08-08 | Discharge: 2021-08-08 | Disposition: A | Discharge status: Home / Self Care | Payer: Medicare Other | Source: Ambulatory Visit | Attending: Oncology | Admitting: Oncology

## 2021-08-08 ENCOUNTER — Other Ambulatory Visit: Payer: Self-pay

## 2021-08-08 ENCOUNTER — Telehealth: Payer: Self-pay | Admitting: *Deleted

## 2021-08-08 DIAGNOSIS — F0390 Unspecified dementia without behavioral disturbance: Secondary | ICD-10-CM | POA: Insufficient documentation

## 2021-08-08 DIAGNOSIS — D649 Anemia, unspecified: Secondary | ICD-10-CM | POA: Insufficient documentation

## 2021-08-08 DIAGNOSIS — N289 Disorder of kidney and ureter, unspecified: Secondary | ICD-10-CM | POA: Diagnosis not present

## 2021-08-08 DIAGNOSIS — D72819 Decreased white blood cell count, unspecified: Secondary | ICD-10-CM | POA: Diagnosis not present

## 2021-08-08 DIAGNOSIS — Q973 Female with 46, XY karyotype: Secondary | ICD-10-CM | POA: Insufficient documentation

## 2021-08-08 DIAGNOSIS — I10 Essential (primary) hypertension: Secondary | ICD-10-CM | POA: Insufficient documentation

## 2021-08-08 DIAGNOSIS — C9000 Multiple myeloma not having achieved remission: Secondary | ICD-10-CM | POA: Insufficient documentation

## 2021-08-08 LAB — CBC WITH DIFFERENTIAL/PLATELET
Abs Immature Granulocytes: 0 10*3/uL (ref 0.00–0.07)
Basophils Absolute: 0 10*3/uL (ref 0.0–0.1)
Basophils Relative: 1 %
Eosinophils Absolute: 0.1 10*3/uL (ref 0.0–0.5)
Eosinophils Relative: 3 %
HCT: 25.6 % — ABNORMAL LOW (ref 36.0–46.0)
Hemoglobin: 8.1 g/dL — ABNORMAL LOW (ref 12.0–15.0)
Immature Granulocytes: 0 %
Lymphocytes Relative: 33 %
Lymphs Abs: 1.3 10*3/uL (ref 0.7–4.0)
MCH: 30.9 pg (ref 26.0–34.0)
MCHC: 31.6 g/dL (ref 30.0–36.0)
MCV: 97.7 fL (ref 80.0–100.0)
Monocytes Absolute: 0.3 10*3/uL (ref 0.1–1.0)
Monocytes Relative: 7 %
Neutro Abs: 2.2 10*3/uL (ref 1.7–7.7)
Neutrophils Relative %: 56 %
Platelets: 196 10*3/uL (ref 150–400)
RBC: 2.62 MIL/uL — ABNORMAL LOW (ref 3.87–5.11)
RDW: 16.7 % — ABNORMAL HIGH (ref 11.5–15.5)
WBC: 3.8 10*3/uL — ABNORMAL LOW (ref 4.0–10.5)
nRBC: 0 % (ref 0.0–0.2)

## 2021-08-08 MED ORDER — FENTANYL CITRATE (PF) 100 MCG/2ML IJ SOLN
INTRAMUSCULAR | Status: AC | PRN
Start: 2021-08-08 — End: 2021-08-08
  Administered 2021-08-08: 25 ug via INTRAVENOUS

## 2021-08-08 MED ORDER — MIDAZOLAM HCL 2 MG/2ML IJ SOLN
INTRAMUSCULAR | Status: AC
  Filled 2021-08-08: qty 2

## 2021-08-08 MED ORDER — HEPARIN SOD (PORK) LOCK FLUSH 100 UNIT/ML IV SOLN
INTRAVENOUS | Status: AC
  Filled 2021-08-08: qty 5

## 2021-08-08 MED ORDER — SODIUM CHLORIDE 0.9 % IV SOLN: INTRAVENOUS | Status: DC

## 2021-08-08 MED ORDER — MIDAZOLAM HCL 2 MG/2ML IJ SOLN
INTRAMUSCULAR | Status: AC | PRN
Start: 2021-08-08 — End: 2021-08-08
  Administered 2021-08-08: .5 mg via INTRAVENOUS

## 2021-08-08 MED ORDER — FENTANYL CITRATE (PF) 100 MCG/2ML IJ SOLN
INTRAMUSCULAR | Status: AC
  Filled 2021-08-08: qty 2

## 2021-08-08 NOTE — Progress Notes (Signed)
Patient clinically stable post BMB per Dr Anselm Pancoast, tolerated well. Vitals stable pre and post procedure. Report given to [facility] [place] post procedure in specials. Received Versed 0.5 mg along with Fentanyl 25 mcg IV for procedure.

## 2021-08-08 NOTE — H&P (Signed)
Chief Complaint: Patient was seen in consultation today for bone marrow biopsy with aspiration   Referring Physician(s): Yu,Zhou  Supervising Physician: Markus Daft  Patient Status: [facility] - Out-pt  History of Present Illness: Stephanie Delacruz is an 88 y.o. female with a medical history significant for HTN, renal insufficiency, dementia and chronic anemia with a baseline hemoglobin of 10. Lab work obtained over the past few months has shown a gradual decline in this value and the patient was referred to oncology for further work up. There is concern for multiple myeloma and Interventional Radiology has been asked to perform a bone marrow biopsy with aspiration for further evaluation.    Past Medical History:  Diagnosis Date   Anemia    GERD (gastroesophageal reflux disease)    Hypercholesterolemia    Hypertension    Renal insufficiency     Past Surgical History:  Procedure Laterality Date   TONSILLECTOMY     TUBAL LIGATION      Allergies: Patient has no known allergies.  Medications: Prior to Admission medications   Medication Sig Start Date End Date Taking? Authorizing Provider  amLODipine (NORVASC) 2.5 MG tablet Take 10 mg by mouth daily. 05/17/21  Yes [provider]  ascorbic acid (VITAMIN C) 500 MG tablet Take 500 mg by mouth daily.   Yes [provider]  aspirin 81 MG EC tablet Take 81 mg by mouth daily.   Yes [provider]  donepezil (ARICEPT) 10 MG tablet Take 10 mg by mouth daily. 05/04/21  Yes [provider]  fenofibrate (TRICOR) 48 MG tablet Take 48 mg by mouth daily.   Yes [provider]  ferrous gluconate (FERGON) 324 MG tablet ferrous gluconate 324 mg (38 mg iron) tablet  TAKE 1 TABLET BY MOUTH THREE TIMES A DAY AS DIRECTED   Yes [provider]  fexofenadine (ALLEGRA) 180 MG tablet Take by mouth.   Yes [provider]  fluticasone (FLONASE) 50 MCG/ACT nasal spray Place into both nostrils daily.    Yes [provider]  hydrochlorothiazide (MICROZIDE) 12.5 MG capsule Take 12.5 mg by mouth daily.   Yes [provider]  losartan (COZAAR) 100 MG tablet Take 100 mg by mouth daily. 05/08/21  Yes [provider]  memantine (NAMENDA) 5 MG tablet Take 10 mg by mouth 2 (two) times daily. 05/07/21  Yes [provider]  metoprolol succinate (TOPROL-XL) 25 MG 24 hr tablet Take 50 mg by mouth daily. 03/30/21  Yes [provider]  Multiple Vitamin (MULTIVITAMIN) tablet Take 1 tablet by mouth daily.   Yes [provider]  omeprazole (PRILOSEC) 20 MG capsule Take 20 mg by mouth daily. 05/31/21  Yes [provider]  Calcium Carbonate-Vitamin D (OYSTER SHELL CALCIUM/D) 500-5 MG-MCG TABS Take by mouth.    [provider]  clonazePAM (KLONOPIN) 0.5 MG tablet Take 0.5 mg by mouth 2 (two) times daily as needed for anxiety.    [provider]     Family History  Problem Relation Age of Onset   Hypertension Mother    Arthritis Mother    Hypertension Father    Stroke Father    Breast cancer Niece     Social History   Socioeconomic History   Marital status: Married    Spouse name: Not on file   Number of children: Not on file   Years of education: Not on file   Highest education level: Not on file  Occupational History   Not on  file  Tobacco Use   Smoking status: Never   Smokeless tobacco: Never  Substance and Sexual Activity   Alcohol use: No   Drug use: No   Sexual activity: Not on file  Other Topics Concern   Not on file  Social History Narrative   Not on file   Social Determinants of Health   Financial Resource Strain: Not on file  Food Insecurity: Not on file  Transportation Needs: Not on file  Physical Activity: Not on file  Stress: Not on file  Social Connections: Not on file    Review of Systems: A 12 point ROS discussed and pertinent positives are indicated in the HPI above.  All other systems are  negative.  Review of Systems  Constitutional:  Negative for appetite change and fatigue.  Respiratory:  Negative for cough and shortness of breath.   Cardiovascular:  Positive for leg swelling. Negative for chest pain.  Gastrointestinal:  Negative for diarrhea, nausea and vomiting.  Neurological:  Negative for dizziness and headaches.  Hematological:  Does not bruise/bleed easily.   Vital Signs: BP (!) 159/82    Pulse 66    Temp 97.9 F (36.6 C)    Resp 18    Ht _0  (1.626 m)    Wt 156 lb (70.8 kg)    SpO2 100%    BMI 26.78 kg/m   Physical Exam Constitutional:      General: She is not in acute distress.    Appearance: She is not ill-appearing.  HENT:     Mouth/Throat:     Mouth: Mucous membranes are moist.     Pharynx: Oropharynx is clear.  Cardiovascular:     Rate and Rhythm: Normal rate and regular rhythm.     Pulses: Normal pulses.     Heart sounds: Normal heart sounds.  Pulmonary:     Effort: Pulmonary effort is normal.     Breath sounds: Normal breath sounds.  Abdominal:     General: Bowel sounds are normal.     Palpations: Abdomen is soft.  Musculoskeletal:     Right lower leg: No edema.     Left lower leg: No edema.  Skin:    General: Skin is warm and dry.  Neurological:     Mental Status: She is alert.     Comments: Oriented to person, place and situation but not time.     Imaging: DG Bone Survey Met  Result Date: 07/29/2021 CLINICAL DATA:  Hypercalcemia, screening for metastatic disease EXAM: METASTATIC BONE SURVEY COMPARISON:  None. FINDINGS: No focal lytic lesions are seen in the bony structures. Osteopenia is seen in bony structures. Degenerative changes are noted in thoracic and lumbar spine. Dextroscoliosis is seen in the thoracic spine. There is decrease in height of few lower thoracic vertebral bodies. Levoscoliosis is seen in the lumbar spine. There is decrease in height of few lumbar vertebral bodies. Degenerative changes are noted in cervical spine.  Transverse diameter of heart is increased. No focal pulmonary infiltrates are seen. IMPRESSION: No focal lytic lesions are seen. Osteopenia. Decrease in height of few thoracic and lumbar vertebral bodies may be residual from previous injury. Other findings as described in the body of the report. Electronically Signed   By: Elmer Picker M.D.   On: 07/29/2021 14:35    Labs:  CBC: Recent Labs    06/24/21 1509 07/15/21 1117 07/24/21 1212 08/08/21 0840  WBC 4.2 3.6* 4.0 3.8*  HGB 8.1* 7.7 Repeated and verified X2.* 7.4*  8.1*  HCT 24.4* 22.9 Repeated and verified X2.* 23.5* 25.6*  PLT 191.0 166.0 175 196    COAGS: No results for input(s): INR, APTT in the last 8760 hours.  BMP: Recent Labs    06/27/21 0940 07/15/21 1117 07/24/21 1212  NA 142 143 141  K 3.8 3.6 3.5  CL 106 103 101  CO2 _0 GLUCOSE 90 84 88  BUN _1 CALCIUM 10.1 10.2 10.2  CREATININE 1.20* 1.09 1.13*  GFRNONAA 44*  --  48*    LIVER FUNCTION TESTS: Recent Labs    06/27/21 0940 07/24/21 1212  BILITOT 0.5 0.2*  AST 20 20  ALT 13 11  ALKPHOS 39 51  PROT 9.8* 10.2*  ALBUMIN 2.8* 2.8*    TUMOR MARKERS: No results for input(s): AFPTM, CEA, CA199, CHROMGRNA in the last 8760 hours.  Assessment and Plan:  Anemia; suspected multiple myeloma: Kaydie L. Buckalew, 88 year old female, presents today to the [facility] Interventional Radiology department for an image-guided bone marrow biopsy with aspiration.  Risks and benefits of this procedure were discussed with the patient and/or patient's family including, but not limited to bleeding, infection, damage to adjacent structures or low yield requiring additional tests.  All of the questions were answered and there is agreement to proceed. She has been NPO. Labs and vitals have been reviewed.   Consent signed and in chart.  Thank you for this interesting consult.  I greatly enjoyed meeting Jasiya L Madera and look forward to  participating in their care.  A copy of this report was sent to the requesting provider on this date.  Electronically Signed: Soyla Dryer, AGACNP-BC [phone] 08/08/2021, 8:55 AM   I spent a total of  30 Minutes   in face to face in clinical consultation, greater than 50% of which was counseling/coordinating care for bone marrow biopsy with aspiration.

## 2021-08-08 NOTE — Telephone Encounter (Signed)
Called report  Study Result  Narrative & Impression  INDICATION: Anemia and myeloma workup.   EXAM: CT GUIDED BONE MARROW ASPIRATES AND BIOPSY   Physician: Stephan Minister. Henn, MD   MEDICATIONS: Moderate sedation   ANESTHESIA/SEDATION: Moderate (conscious) sedation was employed during this procedure. A total of Versed 0.$RemoveBef'5mg'[id number]$  and fentanyl 25 mcg was administered intravenously at the order of the provider performing the procedure.   Total intra-service moderate sedation time: 12 minutes.   Patient's level of consciousness and vital signs were monitored continuously by radiology nurse throughout the procedure under the supervision of the provider performing the procedure.   COMPLICATIONS: None immediate.   PROCEDURE: The procedure was explained to the patient and patient's family. The risks and benefits of the procedure were discussed and the patient's questions were addressed. Informed consent was obtained from the patient. The patient was placed prone on CT table. Images of the pelvis were obtained. The right side of back was prepped and draped in sterile fashion. The skin and right posterior ilium were anesthetized with 1% lidocaine. 11 gauge bone needle was directed into the right ilium with CT guidance. Two aspirates and one core biopsy were obtained. Bandage placed over the puncture site.   FINDINGS: CT imaging through the pelvis demonstrates a low-density and slightly expansile lesion at the left iliac wing that measures up to 2.0 cm. This is seen on sequence 2 image 10.   Biopsy needle was directed in the posterior right ilium.   IMPRESSION: 1. CT-guided bone marrow biopsy and aspiration. 2. Suspicious bone lesion in the left iliac wing. This lesion could be associated with myeloma.   These results will be called to the ordering clinician or representative by the Radiologist Assistant, and communication documented in the PACS or [employer].      Electronically Signed   By: Markus Daft M.D.   On: 08/08/2021 13:19

## 2021-08-08 NOTE — Procedures (Signed)
Interventional Radiology Procedure:   Indications: Anemia and myeloma work up.  Procedure: CT guided bone marrow biopsy  Findings: 2 aspirates and 1 core from right ilium  Complications: None     EBL: Minimal, less than 10 ml  Plan: Discharge to home in one hour.   Salimatou Simone R. Anselm Pancoast, MD  Pager: [phone]

## 2021-08-12 LAB — SURGICAL PATHOLOGY

## 2021-08-20 ENCOUNTER — Encounter (HOSPITAL_COMMUNITY): Payer: Self-pay | Admitting: Oncology

## 2021-08-21 ENCOUNTER — Other Ambulatory Visit: Payer: Medicare Other

## 2021-08-22 ENCOUNTER — Encounter: Payer: Self-pay | Admitting: Oncology

## 2021-08-22 ENCOUNTER — Other Ambulatory Visit: Payer: Self-pay

## 2021-08-22 ENCOUNTER — Other Ambulatory Visit: Payer: Self-pay | Admitting: Oncology

## 2021-08-22 ENCOUNTER — Telehealth: Payer: Self-pay | Admitting: *Deleted

## 2021-08-22 ENCOUNTER — Inpatient Hospital Stay: Payer: Medicare Other | Attending: Oncology | Admitting: Oncology

## 2021-08-22 VITALS — BP 156/71 | HR 62 | Temp 98.0°F | Resp 16 | Wt 142.0 lb

## 2021-08-22 DIAGNOSIS — Z7952 Long term (current) use of systemic steroids: Secondary | ICD-10-CM | POA: Insufficient documentation

## 2021-08-22 DIAGNOSIS — E7800 Pure hypercholesterolemia, unspecified: Secondary | ICD-10-CM | POA: Diagnosis not present

## 2021-08-22 DIAGNOSIS — M8580 Other specified disorders of bone density and structure, unspecified site: Secondary | ICD-10-CM | POA: Diagnosis not present

## 2021-08-22 DIAGNOSIS — Z8261 Family history of arthritis: Secondary | ICD-10-CM | POA: Insufficient documentation

## 2021-08-22 DIAGNOSIS — Z823 Family history of stroke: Secondary | ICD-10-CM | POA: Insufficient documentation

## 2021-08-22 DIAGNOSIS — C9000 Multiple myeloma not having achieved remission: Secondary | ICD-10-CM | POA: Insufficient documentation

## 2021-08-22 DIAGNOSIS — Z8249 Family history of ischemic heart disease and other diseases of the circulatory system: Secondary | ICD-10-CM | POA: Diagnosis not present

## 2021-08-22 DIAGNOSIS — N189 Chronic kidney disease, unspecified: Secondary | ICD-10-CM | POA: Insufficient documentation

## 2021-08-22 DIAGNOSIS — D649 Anemia, unspecified: Secondary | ICD-10-CM

## 2021-08-22 DIAGNOSIS — Z79899 Other long term (current) drug therapy: Secondary | ICD-10-CM | POA: Diagnosis not present

## 2021-08-22 DIAGNOSIS — D638 Anemia in other chronic diseases classified elsewhere: Secondary | ICD-10-CM | POA: Insufficient documentation

## 2021-08-22 DIAGNOSIS — M899 Disorder of bone, unspecified: Secondary | ICD-10-CM | POA: Insufficient documentation

## 2021-08-22 DIAGNOSIS — Z7189 Other specified counseling: Secondary | ICD-10-CM | POA: Diagnosis not present

## 2021-08-22 DIAGNOSIS — M47812 Spondylosis without myelopathy or radiculopathy, cervical region: Secondary | ICD-10-CM | POA: Insufficient documentation

## 2021-08-22 DIAGNOSIS — R5383 Other fatigue: Secondary | ICD-10-CM | POA: Insufficient documentation

## 2021-08-22 DIAGNOSIS — M47816 Spondylosis without myelopathy or radiculopathy, lumbar region: Secondary | ICD-10-CM | POA: Insufficient documentation

## 2021-08-22 DIAGNOSIS — Z803 Family history of malignant neoplasm of breast: Secondary | ICD-10-CM | POA: Insufficient documentation

## 2021-08-22 DIAGNOSIS — I129 Hypertensive chronic kidney disease with stage 1 through stage 4 chronic kidney disease, or unspecified chronic kidney disease: Secondary | ICD-10-CM | POA: Insufficient documentation

## 2021-08-22 DIAGNOSIS — M47814 Spondylosis without myelopathy or radiculopathy, thoracic region: Secondary | ICD-10-CM | POA: Insufficient documentation

## 2021-08-22 MED ORDER — PROCHLORPERAZINE MALEATE 10 MG PO TABS: 10.0000 mg | ORAL_TABLET | Freq: Four times a day (QID) | ORAL | 1 refills | Status: DC | PRN

## 2021-08-22 MED ORDER — ACYCLOVIR 400 MG PO TABS: 400.0000 mg | ORAL_TABLET | Freq: Two times a day (BID) | ORAL | 11 refills | Status: DC

## 2021-08-22 MED ORDER — DEXAMETHASONE 4 MG PO TABS: 20.0000 mg | ORAL_TABLET | Freq: Two times a day (BID) | ORAL | 0 refills | Status: DC

## 2021-08-22 NOTE — Progress Notes (Signed)
Pt in for follow up, denies any difficulties or concerns today. 

## 2021-08-22 NOTE — Telephone Encounter (Signed)
[facility] was sent to [facility] instead of preferred pharmacy, [facility] in [place]. Called [facility] and cancelled, reordered and sent to [facility].

## 2021-08-22 NOTE — Progress Notes (Signed)
START ON PATHWAY REGIMEN - Multiple Myeloma and Other Plasma Cell Dyscrasias     Cycle 1 and 2: A cycle is every 28 days:     Lenalidomide      Dexamethasone      Daratumumab    Cycles 3 through 6: A cycle is every 28 days:     Lenalidomide      Dexamethasone      Daratumumab    Cycles 7 and beyond: A cycle is every 28 days:     Lenalidomide      Dexamethasone      Daratumumab   **Always confirm dose/schedule in your pharmacy ordering system**  Patient Characteristics: Multiple Myeloma, Newly Diagnosed, Transplant Ineligible or Refused, High Risk Disease Classification: Multiple Myeloma R-ISS Staging: Unknown Therapeutic Status: Newly Diagnosed Is Patient Eligible for Transplant<= Transplant Ineligible or Refused Risk Status: High Risk Intent of Therapy: Non-Curative / Palliative Intent, Discussed with Patient

## 2021-08-22 NOTE — Progress Notes (Signed)
Hematology/Oncology Progress note [phone] [phone]      Patient Care Team: Einar Pheasant, MD as PCP - General (Internal Medicine)  REFERRING PROVIDER: Einar Pheasant, MD  CHIEF COMPLAINTS/REASON FOR VISIT:  Follow-up for multiple myeloma  HISTORY OF PRESENTING ILLNESS:   Stephanie Delacruz is a  88 y.o.  female with PMH listed below was seen in consultation at the request of  Einar Pheasant, MD  for evaluation of anemia 07/15/2021, patient had CBC checked at the primary care provider's office.  CBC showed hemoglobin 7.7, hematocrit 23.9, white count 3.6, RDW 16.5.  Platelet count 166. Reviewed previous lab records.  Patient has chronic anemia dating back at least 2016. Her baseline prior to 2018 was about 10.   Hemoglobin was 9.1 in March 2022, and further dropped to 8.1 in October 2022, then November dropped to 7.7. Patient denies being significantly affected, tired, lightheadedness, shortness of breath.  Appetite is fair.  Denies any black or bloody stool.   Denies any history of hemoglobinopathy  INTERVAL HISTORY Stephanie Delacruz is a 88 y.o. female who has above history reviewed by me today presents for follow up visit for management of multiple myeloma 08/26/2021 bone marrow biopsy showed hypercellular marrow with plasma cell neoplasm.  67% plasma cells in the aspirate.  Cytogenetics showed complex hyperplasia, gain of 1 q. Patient was accompanied by her daughter Helene Kelp to review results and management plan.  Review of Systems  Constitutional:  Positive for fatigue. Negative for appetite change, chills and fever.  HENT:   Negative for hearing loss and voice change.   Eyes:  Negative for eye problems.  Respiratory:  Negative for chest tightness and cough.   Cardiovascular:  Negative for chest pain.  Gastrointestinal:  Negative for abdominal distention, abdominal pain and blood in stool.  Endocrine: Negative for hot flashes.  Genitourinary:  Negative for  difficulty urinating and frequency.   Musculoskeletal:  Negative for arthralgias.  Skin:  Negative for itching and rash.  Neurological:  Negative for extremity weakness.  Hematological:  Negative for adenopathy.  Psychiatric/Behavioral:  Negative for confusion.    MEDICAL HISTORY:  Past Medical History:  Diagnosis Date   Anemia    GERD (gastroesophageal reflux disease)    Hypercholesterolemia    Hypertension    Renal insufficiency     SURGICAL HISTORY: Past Surgical History:  Procedure Laterality Date   TONSILLECTOMY     TUBAL LIGATION      SOCIAL HISTORY: Social History   Socioeconomic History   Marital status: Married    Spouse name: Not on file   Number of children: Not on file   Years of education: Not on file   Highest education level: Not on file  Occupational History   Not on file  Tobacco Use   Smoking status: Never   Smokeless tobacco: Never  Substance and Sexual Activity   Alcohol use: No   Drug use: No   Sexual activity: Not on file  Other Topics Concern   Not on file  Social History Narrative   Not on file   Social Determinants of Health   Financial Resource Strain: Not on file  Food Insecurity: Not on file  Transportation Needs: Not on file  Physical Activity: Not on file  Stress: Not on file  Social Connections: Not on file  Intimate Partner Violence: Not on file    FAMILY HISTORY: Family History  Problem Relation Age of Onset   Hypertension Mother    Arthritis  Mother    Hypertension Father    Stroke Father    Breast cancer Niece     ALLERGIES:  has No Known Allergies.  MEDICATIONS:  Current Outpatient Medications  Medication Sig Dispense Refill   amLODipine (NORVASC) 2.5 MG tablet Take 10 mg by mouth daily.     ascorbic acid (VITAMIN C) 500 MG tablet Take 500 mg by mouth daily.     aspirin 81 MG EC tablet Take 81 mg by mouth daily.     Calcium Carbonate-Vitamin D (OYSTER SHELL CALCIUM/D) 500-5 MG-MCG TABS Take by mouth.      donepezil (ARICEPT) 10 MG tablet Take 10 mg by mouth daily.     fenofibrate (TRICOR) 48 MG tablet Take 48 mg by mouth daily.     hydrochlorothiazide (MICROZIDE) 12.5 MG capsule Take 12.5 mg by mouth daily.     losartan (COZAAR) 100 MG tablet Take 100 mg by mouth daily.     memantine (NAMENDA) 5 MG tablet Take 10 mg by mouth 2 (two) times daily.     metoprolol succinate (TOPROL-XL) 25 MG 24 hr tablet Take 50 mg by mouth daily.     Multiple Vitamin (MULTIVITAMIN) tablet Take 1 tablet by mouth daily.     omeprazole (PRILOSEC) 20 MG capsule Take 20 mg by mouth daily.     clonazePAM (KLONOPIN) 0.5 MG tablet Take 0.5 mg by mouth 2 (two) times daily as needed for anxiety. (Patient not taking: Reported on 08/22/2021)     dexamethasone (DECADRON) 4 MG tablet Take 5 tablets (20 mg total) by mouth 2 (two) times daily with a meal. 5 tablet 0   fexofenadine (ALLEGRA) 180 MG tablet Take by mouth. (Patient not taking: Reported on 08/22/2021)     fluticasone (FLONASE) 50 MCG/ACT nasal spray Place into both nostrils daily. (Patient not taking: Reported on 08/22/2021)     No current facility-administered medications for this visit.     PHYSICAL EXAMINATION: ECOG PERFORMANCE STATUS: 1 - Symptomatic but completely ambulatory Vitals:   08/22/21 1041  BP: (!) 156/71  Pulse: 62  Resp: 16  Temp: 98 F (36.7 C)  SpO2: 100%   Filed Weights   08/22/21 1041  Weight: 142 lb (64.4 kg)    Physical Exam Constitutional:      General: She is not in acute distress. HENT:     Head: Normocephalic and atraumatic.  Eyes:     General: No scleral icterus. Cardiovascular:     Rate and Rhythm: Normal rate and regular rhythm.     Heart sounds: Normal heart sounds.  Pulmonary:     Effort: Pulmonary effort is normal. No respiratory distress.     Breath sounds: No wheezing.  Abdominal:     General: Bowel sounds are normal. There is no distension.     Palpations: Abdomen is soft.  Musculoskeletal:        General: No  deformity. Normal range of motion.     Cervical back: Normal range of motion and neck supple.  Skin:    General: Skin is warm and dry.     Findings: No erythema or rash.  Neurological:     Mental Status: She is alert and oriented to person, place, and time. Mental status is at baseline.     Cranial Nerves: No cranial nerve deficit.     Coordination: Coordination normal.  Psychiatric:        Mood and Affect: Mood normal.    LABORATORY DATA:  I have reviewed the data  as listed Lab Results  Component Value Date   WBC 3.8 (L) 08/08/2021   HGB 8.1 (L) 08/08/2021   HCT 25.6 (L) 08/08/2021   MCV 97.7 08/08/2021   PLT 196 08/08/2021   Recent Labs    06/27/21 0940 07/15/21 1117 07/24/21 1212  NA 142 143 141  K 3.8 3.6 3.5  CL 106 103 101  CO2 _0 GLUCOSE 90 84 88  BUN _1 CREATININE 1.20* 1.09 1.13*  CALCIUM 10.1 10.2 10.2  GFRNONAA 44*  --  48*  PROT 9.8*  --  10.2*  ALBUMIN 2.8*  --  2.8*  AST 20  --  20  ALT 13  --  11  ALKPHOS 39  --  51  BILITOT 0.5  --  0.2*  BILIDIR <0.1  --   --   IBILI NOT CALCULATED  --   --     Iron/TIBC/Ferritin/ %Sat    Component Value Date/Time   IRON 54 07/24/2021 1212   TIBC 277 07/24/2021 1212   FERRITIN 93 07/24/2021 1212   IRONPCTSAT 20 07/24/2021 1212       RADIOGRAPHIC STUDIES: I have personally reviewed the radiological images as listed and agreed with the findings in the report. DG Bone Survey Met  Result Date: 07/29/2021 CLINICAL DATA:  Hypercalcemia, screening for metastatic disease EXAM: METASTATIC BONE SURVEY COMPARISON:  None. FINDINGS: No focal lytic lesions are seen in the bony structures. Osteopenia is seen in bony structures. Degenerative changes are noted in thoracic and lumbar spine. Dextroscoliosis is seen in the thoracic spine. There is decrease in height of few lower thoracic vertebral bodies. Levoscoliosis is seen in the lumbar spine. There is decrease in height of few lumbar vertebral bodies.  Degenerative changes are noted in cervical spine. Transverse diameter of heart is increased. No focal pulmonary infiltrates are seen. IMPRESSION: No focal lytic lesions are seen. Osteopenia. Decrease in height of few thoracic and lumbar vertebral bodies may be residual from previous injury. Other findings as described in the body of the report. Electronically Signed   By: Elmer Picker M.D.   On: 07/29/2021 14:35   CT BONE MARROW BIOPSY & ASPIRATION  Result Date: 08/08/2021 INDICATION: Anemia and myeloma workup. EXAM: CT GUIDED BONE MARROW ASPIRATES AND BIOPSY Physician: Stephan Minister. Henn, MD MEDICATIONS: Moderate sedation ANESTHESIA/SEDATION: Moderate (conscious) sedation was employed during this procedure. A total of Versed 0.91m and fentanyl 25 mcg was administered intravenously at the order of the provider performing the procedure. Total intra-service moderate sedation time: 12 minutes. Patient's level of consciousness and vital signs were monitored continuously by radiology nurse throughout the procedure under the supervision of the provider performing the procedure. COMPLICATIONS: None immediate. PROCEDURE: The procedure was explained to the patient and patient's family. The risks and benefits of the procedure were discussed and the patient's questions were addressed. Informed consent was obtained from the patient. The patient was placed prone on CT table. Images of the pelvis were obtained. The right side of back was prepped and draped in sterile fashion. The skin and right posterior ilium were anesthetized with 1% lidocaine. 11 gauge bone needle was directed into the right ilium with CT guidance. Two aspirates and one core biopsy were obtained. Bandage placed over the puncture site. FINDINGS: CT imaging through the pelvis demonstrates a low-density and slightly expansile lesion at the left iliac wing that measures up to 2.0 cm. This is seen on sequence 2 image 10. Biopsy needle was directed  in the  posterior right ilium. IMPRESSION: 1. CT-guided bone marrow biopsy and aspiration. 2. Suspicious bone lesion in the left iliac wing. This lesion could be associated with myeloma. These results will be called to the ordering clinician or representative by the Radiologist Assistant, and communication documented in the PACS or [employer]. Electronically Signed   By: Markus Daft M.D.   On: 08/08/2021 13:19      ASSESSMENT & PLAN:  1. Multiple myeloma not having achieved remission ([place])   2. Goals of care, counseling/discussion   3. Bone lesion   4. Normocytic anemia    Cancer Staging  Multiple myeloma ([place]) Staging form: Plasma Cell Myeloma and Plasma Cell Disorders, AJCC 8th Edition - Clinical stage from 08/22/2021: Albumin (g/dL): 2.8, ISS: Stage II, High-risk cytogenetics: Absent, LDH: Normal - Signed by Earlie Server, MD on 08/22/2021    #Multiple myeloma, IgA kappa showed M protein of 5.2, corrected calcium of 11.2, severe anemia 7.7.  Creatinine 1.13. Bone marrow biopsy results was reviewed and discussed with patient. Consistent with multiple myeloma diagnosis. The diagnosis and care plan were discussed with patient in detail.    The goal of treatment which is to palliate disease, disease related symptoms, improve quality of life and hopefully prolong life was highlighted in our discussion.  I explained to the patient the risks and benefits of chemotherapy Daratumumab/Revlimid/dexamethasone including all but not limited to hair loss, mouth sore, nausea, vomiting, diarrhea, low blood counts, bleeding, neuropathy and risk of life threatening infection and even death, secondary malignancy etc.  . Patient voices understanding and willing to proceed chemotherapy.  While working on getting chemotherapy regimen approved, recommend patient to take dexamethasone 20 mg x 1 today.  Prescription sent to pharmacy.  # Chemotherapy education; Antiemetics-Compazine sent to pharmacy, acyclovir 400 mg twice  daily. #Bone lesion obtain PET scan for evaluation.  Patient has bone lesion on CT.  Recommend patient to get a dental clearance for future bisphosphonate treatments.  #Anemia, secondary to multiple myeloma. Most recent hemoglobin 8.1.  Close monitor. #Chronic kidney disease, recommend patient to avoid nephrotoxin.  Encourage oral hydration.  Supportive care measures are necessary for patient well-being and will be provided as necessary. We spent sufficient time to discuss many aspect of care, questions were answered to patient's satisfaction.     All questions were answered. The patient knows to call the clinic with any problems questions or concerns.  cc Einar Pheasant, MD    Return of visit: 1 to 2 weeks to start treatment.    Earlie Server, MD, PhD [facility] Hematology Oncology 08/22/2021

## 2021-08-22 NOTE — Telephone Encounter (Signed)
Pharmacy called asking for clarification as directions do no match quantity ordered on Dexamethasone prescription sent. Please return call to [place]

## 2021-08-22 NOTE — Progress Notes (Signed)
DISCONTINUE ON PATHWAY REGIMEN - Multiple Myeloma and Other Plasma Cell Dyscrasias     Cycle 1 and 2: A cycle is every 28 days:     Lenalidomide      Dexamethasone      Daratumumab    Cycles 3 through 6: A cycle is every 28 days:     Lenalidomide      Dexamethasone      Daratumumab    Cycles 7 and beyond: A cycle is every 28 days:     Lenalidomide      Dexamethasone      Daratumumab   **Always confirm dose/schedule in your pharmacy ordering system**  REASON: Other Reason PRIOR TREATMENT: [id number]: DaraRd (Daratumumab 16 mg/kg IV + Lenalidomide + Dexamethasone 40 mg PO/IV) q28 Days Until Progression or Unacceptable Toxicity TREATMENT RESPONSE: Unable to Evaluate  START ON PATHWAY REGIMEN - Multiple Myeloma and Other Plasma Cell Dyscrasias     Cycles 1 and 2: A cycle is every 28 days:     Lenalidomide      Dexamethasone      Daratumumab and hyaluronidase-fihj    Cycles 3 through 6: A cycle is every 28 days:     Lenalidomide      Dexamethasone      Daratumumab and hyaluronidase-fihj    Cycles 7 and beyond: A cycle is every 28 days:     Lenalidomide      Dexamethasone      Daratumumab and hyaluronidase-fihj   **Always confirm dose/schedule in your pharmacy ordering system**  Patient Characteristics: Multiple Myeloma, Newly Diagnosed, Transplant Ineligible or Refused, High Risk Disease Classification: Multiple Myeloma R-ISS Staging: II Therapeutic Status: Newly Diagnosed Is Patient Eligible for Transplant<= Transplant Ineligible or Refused Risk Status: High Risk Intent of Therapy: Non-Curative / Palliative Intent, Discussed with Patient

## 2021-08-22 NOTE — Progress Notes (Signed)
Patient on plan of care prior to pathways. 

## 2021-08-23 ENCOUNTER — Encounter: Payer: Self-pay | Admitting: Oncology

## 2021-08-23 ENCOUNTER — Telehealth: Payer: Self-pay | Admitting: *Deleted

## 2021-08-23 ENCOUNTER — Telehealth: Payer: Self-pay

## 2021-08-23 ENCOUNTER — Other Ambulatory Visit: Payer: Self-pay | Admitting: Oncology

## 2021-08-23 MED ORDER — DEXAMETHASONE 4 MG PO TABS: 20.0000 mg | ORAL_TABLET | Freq: Once | ORAL | 0 refills | Status: AC

## 2021-08-23 NOTE — Telephone Encounter (Signed)
Spoke with pharmacy to clarify dosing. Dr. Tasia Catchings had entered dosing wrong. Patient to take 1 tablet daily for 5 days. Spoke with pharmacy to clear up confusion. Also, called pharmacy to clarify. Informed patient's daughter, Levada Dy to inform her prescription has been fixed and will be ready for pickup today. Levada Dy verbalized understanding.

## 2021-08-23 NOTE — Telephone Encounter (Signed)
-----   Message from Earlie Server, MD sent at 08/22/2021  8:50 PM EST ----- [name] Please set patient up for chemotherapy class for Daratumumab subcutaneous. Please arrange patient to get lab MD Daratumumab subcutaneous injection next week.  Alyson Angiulli, I plan to start patient on daratumumab/Revlimid/dexamethasone.  Please check Revlimid 10 mg 21/28 schedule coverage and if covered, please deliver.  I plan to start daratumumab treatment first while waiting for Revlimid coverage. Thank you

## 2021-08-23 NOTE — Telephone Encounter (Signed)
Please fax the dental clearance request form to their office [phone]

## 2021-08-23 NOTE — Progress Notes (Signed)
Pharmacist Chemotherapy Monitoring - Initial Assessment    Anticipated start date: 08/30/21   The following has been reviewed per standard work regarding the patient's treatment regimen: The patient's diagnosis, treatment plan and drug doses, and organ/hematologic function Lab orders and baseline tests specific to treatment regimen  The treatment plan start date, drug sequencing, and pre-medications Prior authorization status  Patient's documented medication list, including drug-drug interaction screen and prescriptions for anti-emetics and supportive care specific to the treatment regimen The drug concentrations, fluid compatibility, administration routes, and timing of the medications to be used The patient's access for treatment and lifetime cumulative dose history, if applicable  The patient's medication allergies and previous infusion related reactions, if applicable   Changes made to treatment plan:  treatment plan date  Follow up needed:  signing treatment plan   Adelina Mings, [place], 08/23/2021  10:06 AM

## 2021-08-23 NOTE — Telephone Encounter (Signed)
[place], please add Daratumumab subq to chemo class appt already scheduled.  Lab/MD/Dara injection next week. Please notify patient of appt. Thanks

## 2021-08-26 ENCOUNTER — Encounter: Payer: Self-pay | Admitting: Internal Medicine

## 2021-08-26 ENCOUNTER — Other Ambulatory Visit (HOSPITAL_COMMUNITY): Payer: Self-pay

## 2021-08-26 ENCOUNTER — Telehealth: Payer: Self-pay | Admitting: Pharmacist

## 2021-08-26 ENCOUNTER — Other Ambulatory Visit: Payer: Self-pay

## 2021-08-26 ENCOUNTER — Encounter: Payer: Self-pay | Admitting: Oncology

## 2021-08-26 ENCOUNTER — Ambulatory Visit (INDEPENDENT_AMBULATORY_CARE_PROVIDER_SITE_OTHER): Payer: Medicare Other | Admitting: Internal Medicine

## 2021-08-26 ENCOUNTER — Telehealth: Payer: Self-pay | Admitting: Pharmacy Technician

## 2021-08-26 VITALS — BP 132/70 | HR 60 | Temp 97.7°F | Resp 16 | Ht 62.0 in | Wt 145.4 lb

## 2021-08-26 DIAGNOSIS — R051 Acute cough: Secondary | ICD-10-CM | POA: Diagnosis not present

## 2021-08-26 DIAGNOSIS — I10 Essential (primary) hypertension: Secondary | ICD-10-CM | POA: Diagnosis not present

## 2021-08-26 DIAGNOSIS — N1831 Chronic kidney disease, stage 3a: Secondary | ICD-10-CM

## 2021-08-26 DIAGNOSIS — C9000 Multiple myeloma not having achieved remission: Secondary | ICD-10-CM | POA: Diagnosis not present

## 2021-08-26 DIAGNOSIS — Z8659 Personal history of other mental and behavioral disorders: Secondary | ICD-10-CM

## 2021-08-26 DIAGNOSIS — E7800 Pure hypercholesterolemia, unspecified: Secondary | ICD-10-CM

## 2021-08-26 DIAGNOSIS — Z23 Encounter for immunization: Secondary | ICD-10-CM | POA: Diagnosis not present

## 2021-08-26 DIAGNOSIS — R059 Cough, unspecified: Secondary | ICD-10-CM | POA: Insufficient documentation

## 2021-08-26 MED ORDER — LENALIDOMIDE 10 MG PO CAPS: 10.0000 mg | ORAL_CAPSULE | Freq: Every day | ORAL | 0 refills | Status: DC

## 2021-08-26 MED ORDER — CEFDINIR 300 MG PO CAPS: 300.0000 mg | ORAL_CAPSULE | Freq: Two times a day (BID) | ORAL | 0 refills | Status: DC

## 2021-08-26 NOTE — Telephone Encounter (Signed)
Oral Oncology Patient Advocate Encounter  Prior Authorization for Revlimid has been approved.    PA# [id number] Effective dates: 08/26/21 through 08/26/22  Patients co-pay is $3342.70  Oral Oncology Clinic will continue to follow.   [place] Patient [place] Phone [phone] Fax [phone] 08/26/2021 2:09 PM

## 2021-08-26 NOTE — Telephone Encounter (Signed)
Stephanie Delacruz will be *new*

## 2021-08-26 NOTE — Telephone Encounter (Signed)
Oral Oncology Patient Advocate Encounter   Received notification from [facility] that prior authorization for Revlimid is required.   PA submitted on CoverMyMeds Key [id number] Status is pending   Oral Oncology Clinic will continue to follow.  [place] Patient [place] Phone [phone] Fax [phone] 08/26/2021 12:23 PM

## 2021-08-26 NOTE — Telephone Encounter (Signed)
Oral Oncology Pharmacist Encounter  Received new prescription for Revlimid (lenalidomide) for the treatment of newly diagnosed multiple myeloma in conjunction with daratumumab and dexamethasone, planned duration until disease control or unacceptable drug toxicity.  CMP from 07/24/22 assessed, SCr slightly elevated at 1.13, CrCl ~37 mL/min. Prescription dose and frequency assessed. Lenalidomide dose appropriately renally dose adjusted.   Current medication list in Epic reviewed, no DDIs with lenalidomide identified.  Evaluated chart and no patient barriers to medication adherence identified.   Prescription has been e-scribed to the [facility] for benefits analysis and approval.  Oral Oncology Clinic will continue to follow for insurance authorization, copayment issues, initial counseling and start date.   Darl Pikes, PharmD, BCPS, BCOP, CPP Hematology/Oncology Clinical Pharmacist Practitioner [place]/AP [facility] [phone]  08/26/2021 12:03 PM

## 2021-08-26 NOTE — Telephone Encounter (Signed)
Dental clearance form faxed.

## 2021-08-26 NOTE — Patient Instructions (Signed)
Robitussin DM - twice a day as needed for cough and congestion.   Saline nasal spray - flush nose at least 2x/day  Probiotic (or yogurt) - daily while on the antibiotic and for two weeks after completing the antibiotic.   Examples of probiotics:  align, culturelle or florastor.

## 2021-08-26 NOTE — Progress Notes (Signed)
Patient ID: Stephanie Delacruz, female   DOB: [birth year], 88 y.o.   MRN: [id number]   Subjective:    Patient ID: Stephanie Delacruz, female    DOB: Jan 09, 1934, 88 y.o.   MRN: [id number]  This visit occurred during the SARS-CoV-2 public health emergency.  Safety protocols were in place, including screening questions prior to the visit, additional usage of staff PPE, and extensive cleaning of exam room while observing appropriate contact time as indicated for disinfecting solutions.   Patient here for a scheduled follow up.   Chief Complaint  Patient presents with   Hypertension   .   HPI She is accompanied by her daughter.  History obtained from both of them.  Recently diagnosed with multiple myeloma.  Seeing hematology - see note for treatment plan.  Reports noticing incresed sinus/nasal congestion and cough.  Mostly clear mucus, but persistent symptoms.  Daughter (she lives with) - similar symptoms.  Eating.  No nausea or vomiting.  No abdominal pain.  Fatigue.     Past Medical History:  Diagnosis Date   Anemia    GERD (gastroesophageal reflux disease)    Hypercholesterolemia    Hypertension    Renal insufficiency    Past Surgical History:  Procedure Laterality Date   TONSILLECTOMY     TUBAL LIGATION     Family History  Problem Relation Age of Onset   Hypertension Mother    Arthritis Mother    Hypertension Father    Stroke Father    Breast cancer Niece    Social History   Socioeconomic History   Marital status: Married    Spouse name: Not on file   Number of children: Not on file   Years of education: Not on file   Highest education level: Not on file  Occupational History   Not on file  Tobacco Use   Smoking status: Never   Smokeless tobacco: Never  Substance and Sexual Activity   Alcohol use: No   Drug use: No   Sexual activity: Not on file  Other Topics Concern   Not on file  Social History Narrative   Not on file   Social Determinants of Health   Financial Resource  Strain: Not on file  Food Insecurity: Not on file  Transportation Needs: Not on file  Physical Activity: Not on file  Stress: Not on file  Social Connections: Not on file     Review of Systems  Constitutional:  Positive for fatigue. Negative for appetite change and unexpected weight change.  HENT:  Positive for congestion and sinus pressure.   Respiratory:  Positive for cough. Negative for chest tightness and shortness of breath.   Cardiovascular:  Negative for chest pain, palpitations and leg swelling.  Gastrointestinal:  Negative for abdominal pain, diarrhea, nausea and vomiting.  Genitourinary:  Negative for difficulty urinating and dysuria.  Musculoskeletal:  Negative for joint swelling and myalgias.  Skin:  Negative for color change and rash.  Neurological:  Negative for dizziness, light-headedness and headaches.  Psychiatric/Behavioral:  Negative for agitation and dysphoric mood.       Objective:     BP 132/70    Pulse 60    Temp 97.7 F (36.5 C)    Resp 16    Ht $R'5\' 2"'DQ$  (1.575 m)    Wt 145 lb 6.4 oz (66 kg)    SpO2 99%    BMI 26.59 kg/m  Wt Readings from Last 3 Encounters:  09/06/21 143 lb 9.6 oz (65.1  kg)  08/30/21 143 lb (64.9 kg)  08/26/21 145 lb 6.4 oz (66 kg)    Physical Exam Vitals reviewed.  Constitutional:      General: She is not in acute distress.    Appearance: Normal appearance.  HENT:     Head: Normocephalic and atraumatic.     Right Ear: External ear normal.     Left Ear: External ear normal.  Eyes:     General: No scleral icterus.       Right eye: No discharge.        Left eye: No discharge.     Conjunctiva/sclera: Conjunctivae normal.  Neck:     Thyroid: No thyromegaly.  Cardiovascular:     Rate and Rhythm: Normal rate and regular rhythm.  Pulmonary:     Effort: No respiratory distress.     Breath sounds: Normal breath sounds. No wheezing.  Abdominal:     General: Bowel sounds are normal.     Palpations: Abdomen is soft.     Tenderness:  There is no abdominal tenderness.  Musculoskeletal:        General: No swelling or tenderness.     Cervical back: Neck supple. No tenderness.  Lymphadenopathy:     Cervical: No cervical adenopathy.  Skin:    Findings: No erythema or rash.  Neurological:     Mental Status: She is alert.  Psychiatric:        Mood and Affect: Mood normal.        Behavior: Behavior normal.     Outpatient Encounter Medications as of 08/26/2021  Medication Sig   [DISCONTINUED] cefdinir (OMNICEF) 300 MG capsule Take 1 capsule (300 mg total) by mouth 2 (two) times daily.   acyclovir (ZOVIRAX) 400 MG tablet Take 1 tablet (400 mg total) by mouth 2 (two) times daily.   amLODipine (NORVASC) 2.5 MG tablet Take 10 mg by mouth daily.   ascorbic acid (VITAMIN C) 500 MG tablet Take 500 mg by mouth daily.   aspirin 81 MG EC tablet Take 81 mg by mouth daily.   donepezil (ARICEPT) 10 MG tablet Take 10 mg by mouth daily.   fenofibrate (TRICOR) 48 MG tablet Take 48 mg by mouth daily.   fexofenadine (ALLEGRA) 180 MG tablet Take by mouth.   hydrochlorothiazide (MICROZIDE) 12.5 MG capsule Take 12.5 mg by mouth daily.   losartan (COZAAR) 100 MG tablet Take 100 mg by mouth daily.   memantine (NAMENDA) 5 MG tablet Take 10 mg by mouth 2 (two) times daily.   metoprolol succinate (TOPROL-XL) 25 MG 24 hr tablet Take 50 mg by mouth daily.   omeprazole (PRILOSEC) 20 MG capsule Take 20 mg by mouth daily.   prochlorperazine (COMPAZINE) 10 MG tablet Take 1 tablet (10 mg total) by mouth every 6 (six) hours as needed (Nausea or vomiting).   [DISCONTINUED] Calcium Carbonate-Vitamin D (OYSTER SHELL CALCIUM/D) 500-5 MG-MCG TABS Take by mouth.   [DISCONTINUED] clonazePAM (KLONOPIN) 0.5 MG tablet Take 0.5 mg by mouth 2 (two) times daily as needed for anxiety. (Patient not taking: Reported on 08/22/2021)   [DISCONTINUED] fluticasone (FLONASE) 50 MCG/ACT nasal spray Place into both nostrils daily. (Patient not taking: Reported on 08/22/2021)    [DISCONTINUED] Multiple Vitamin (MULTIVITAMIN) tablet Take 1 tablet by mouth daily.   No facility-administered encounter medications on file as of 08/26/2021.     Lab Results  Component Value Date   WBC 3.6 (L) 09/06/2021   HGB 7.8 (L) 09/06/2021   HCT 25.3 (L) 09/06/2021  PLT 182 09/06/2021   GLUCOSE 90 09/06/2021   CHOL 115 06/27/2021   TRIG 34 06/27/2021   HDL 51 06/27/2021   LDLCALC 57 06/27/2021   ALT 15 09/06/2021   AST 24 09/06/2021   NA 139 09/06/2021   K 4.0 09/06/2021   CL 104 09/06/2021   CREATININE 1.15 (H) 09/06/2021   BUN 23 09/06/2021   CO2 23 09/06/2021   TSH 0.346 (L) 07/24/2021    CT BONE MARROW BIOPSY & ASPIRATION  Result Date: 08/08/2021 INDICATION: Anemia and myeloma workup. EXAM: CT GUIDED BONE MARROW ASPIRATES AND BIOPSY Physician: Stephan Minister. Henn, MD MEDICATIONS: Moderate sedation ANESTHESIA/SEDATION: Moderate (conscious) sedation was employed during this procedure. A total of Versed 0.$RemoveBef'5mg'[id number]$  and fentanyl 25 mcg was administered intravenously at the order of the provider performing the procedure. Total intra-service moderate sedation time: 12 minutes. Patient's level of consciousness and vital signs were monitored continuously by radiology nurse throughout the procedure under the supervision of the provider performing the procedure. COMPLICATIONS: None immediate. PROCEDURE: The procedure was explained to the patient and patient's family. The risks and benefits of the procedure were discussed and the patient's questions were addressed. Informed consent was obtained from the patient. The patient was placed prone on CT table. Images of the pelvis were obtained. The right side of back was prepped and draped in sterile fashion. The skin and right posterior ilium were anesthetized with 1% lidocaine. 11 gauge bone needle was directed into the right ilium with CT guidance. Two aspirates and one core biopsy were obtained. Bandage placed over the puncture site. FINDINGS: CT  imaging through the pelvis demonstrates a low-density and slightly expansile lesion at the left iliac wing that measures up to 2.0 cm. This is seen on sequence 2 image 10. Biopsy needle was directed in the posterior right ilium. IMPRESSION: 1. CT-guided bone marrow biopsy and aspiration. 2. Suspicious bone lesion in the left iliac wing. This lesion could be associated with myeloma. These results will be called to the ordering clinician or representative by the Radiologist Assistant, and communication documented in the PACS or [employer]. Electronically Signed   By: Markus Daft M.D.   On: 08/08/2021 13:19       Assessment & Plan:   Problem List Items Addressed This Visit     Cough    Cough and congestion as outlined.  omnicef as directed.  Saline nasal spray and steroid nasal spray as directed.  Robitussin DM as directed.        Relevant Orders   COVID-19, Flu A+B and RSV (Completed)   History of dementia    On aricept and namenda.  Daughter had requested referral to neurology - appt has been scheduled.        Hypercholesterolemia    On tricor.  Check lipid panel.        Hypertension, essential    Blood pressure as outlined - outside readings averaging 130s/80s.  Continue toprol, amlodipine, microzide and losartan.  Continue current medication regimen.  Follow pressures.  Follow metabolic panel.       Multiple myeloma ([place])    Followed by hematology.  Treatment planned - per hematology       Stage 3a chronic kidney disease (HCC)    Avoid antiinflammatories.  Stay hydrated.  Follow renal function. Continue losartan.       Other Visit Diagnoses     Encounter for immunization    -  Primary   Relevant Orders   Flu Vaccine MDCK  QUAD PF (Completed)        Einar Pheasant, MD

## 2021-08-27 ENCOUNTER — Encounter: Payer: Self-pay | Admitting: Oncology

## 2021-08-27 ENCOUNTER — Telehealth: Payer: Self-pay | Admitting: Pharmacy Technician

## 2021-08-27 ENCOUNTER — Ambulatory Visit
Admission: RE | Admit: 2021-08-27 | Discharge: 2021-08-27 | Disposition: A | Discharge status: Home / Self Care | Payer: Medicare Other | Source: Ambulatory Visit | Attending: Oncology | Admitting: Oncology

## 2021-08-27 DIAGNOSIS — Z131 Encounter for screening for diabetes mellitus: Secondary | ICD-10-CM | POA: Diagnosis not present

## 2021-08-27 DIAGNOSIS — M8458XA Pathological fracture in neoplastic disease, other specified site, initial encounter for fracture: Secondary | ICD-10-CM | POA: Diagnosis not present

## 2021-08-27 DIAGNOSIS — C9000 Multiple myeloma not having achieved remission: Secondary | ICD-10-CM | POA: Insufficient documentation

## 2021-08-27 LAB — GLUCOSE, CAPILLARY: Glucose-Capillary: 79 mg/dL (ref 70–99)

## 2021-08-27 MED ORDER — LENALIDOMIDE 10 MG PO CAPS: 10.0000 mg | ORAL_CAPSULE | Freq: Every day | ORAL | 0 refills | Status: DC

## 2021-08-27 MED ORDER — FLUDEOXYGLUCOSE F - 18 (FDG) INJECTION
7.5000 | Freq: Once | INTRAVENOUS | Status: AC | PRN
  Administered 2021-08-27: 7.88 via INTRAVENOUS

## 2021-08-28 ENCOUNTER — Telehealth: Payer: Self-pay | Admitting: *Deleted

## 2021-08-28 LAB — COVID-19, FLU A+B AND RSV
Influenza A, NAA: NOT DETECTED
Influenza B, NAA: NOT DETECTED
RSV, NAA: NOT DETECTED
SARS-CoV-2, NAA: NOT DETECTED

## 2021-08-28 NOTE — Telephone Encounter (Signed)
This maybe an old message, I sent this on Friday and we already received their response. Document scanned in Media.

## 2021-08-28 NOTE — Telephone Encounter (Signed)
-----   Message from Einar Pheasant, MD sent at 08/28/2021  5:54 AM EST ----- Please call and notify nasal swab negative for flu, covid and RSV.

## 2021-08-28 NOTE — Telephone Encounter (Signed)
Patient daughter Teddie Curd called in. I advised what the notes stated that   nasal swab negative for flu, covid and RSV

## 2021-08-28 NOTE — Telephone Encounter (Signed)
Call from [facility] at Dr Rosana Hoes office asking for a clearance form be faxed to them for what we are asking for [phone]

## 2021-08-28 NOTE — Telephone Encounter (Signed)
Please advised of below message.

## 2021-08-29 ENCOUNTER — Other Ambulatory Visit (HOSPITAL_COMMUNITY): Payer: Self-pay

## 2021-08-29 ENCOUNTER — Inpatient Hospital Stay: Payer: Medicare Other | Attending: Oncology

## 2021-08-29 ENCOUNTER — Other Ambulatory Visit: Payer: Self-pay

## 2021-08-29 ENCOUNTER — Inpatient Hospital Stay: Payer: Medicare Other | Admitting: Pharmacist

## 2021-08-29 DIAGNOSIS — K5730 Diverticulosis of large intestine without perforation or abscess without bleeding: Secondary | ICD-10-CM | POA: Insufficient documentation

## 2021-08-29 DIAGNOSIS — Z79899 Other long term (current) drug therapy: Secondary | ICD-10-CM | POA: Insufficient documentation

## 2021-08-29 DIAGNOSIS — R5383 Other fatigue: Secondary | ICD-10-CM | POA: Insufficient documentation

## 2021-08-29 DIAGNOSIS — Z5112 Encounter for antineoplastic immunotherapy: Secondary | ICD-10-CM | POA: Insufficient documentation

## 2021-08-29 DIAGNOSIS — I700 Atherosclerosis of aorta: Secondary | ICD-10-CM | POA: Insufficient documentation

## 2021-08-29 DIAGNOSIS — Z8261 Family history of arthritis: Secondary | ICD-10-CM | POA: Insufficient documentation

## 2021-08-29 DIAGNOSIS — I129 Hypertensive chronic kidney disease with stage 1 through stage 4 chronic kidney disease, or unspecified chronic kidney disease: Secondary | ICD-10-CM | POA: Insufficient documentation

## 2021-08-29 DIAGNOSIS — N1831 Chronic kidney disease, stage 3a: Secondary | ICD-10-CM | POA: Insufficient documentation

## 2021-08-29 DIAGNOSIS — Z7961 Long term (current) use of immunomodulator: Secondary | ICD-10-CM | POA: Insufficient documentation

## 2021-08-29 DIAGNOSIS — C9000 Multiple myeloma not having achieved remission: Secondary | ICD-10-CM

## 2021-08-29 DIAGNOSIS — Z823 Family history of stroke: Secondary | ICD-10-CM | POA: Insufficient documentation

## 2021-08-29 DIAGNOSIS — E7800 Pure hypercholesterolemia, unspecified: Secondary | ICD-10-CM | POA: Insufficient documentation

## 2021-08-29 DIAGNOSIS — E46 Unspecified protein-calorie malnutrition: Secondary | ICD-10-CM | POA: Insufficient documentation

## 2021-08-29 DIAGNOSIS — Z8249 Family history of ischemic heart disease and other diseases of the circulatory system: Secondary | ICD-10-CM | POA: Insufficient documentation

## 2021-08-29 DIAGNOSIS — D649 Anemia, unspecified: Secondary | ICD-10-CM | POA: Insufficient documentation

## 2021-08-29 DIAGNOSIS — E8809 Other disorders of plasma-protein metabolism, not elsewhere classified: Secondary | ICD-10-CM | POA: Insufficient documentation

## 2021-08-29 DIAGNOSIS — Z803 Family history of malignant neoplasm of breast: Secondary | ICD-10-CM | POA: Insufficient documentation

## 2021-08-29 DIAGNOSIS — R0981 Nasal congestion: Secondary | ICD-10-CM | POA: Insufficient documentation

## 2021-08-29 NOTE — Progress Notes (Signed)
[place]  [phone] [phone]  Patient Care Team: Einar Pheasant, MD as PCP - General (Internal Medicine)   Name of the patient: Stephanie Delacruz  [id number]  07/22/1934   Date of visit: 08/29/21  HPI: Patient is a 88 y.o. female with newly diagnosed multiple myeloma. Planned treatment with daratumumab, lenalidomide (Revlimid), and dexamethasone. Treatment to be started on 08/30/21.  Reason for Consult: Lenalidomide oral chemotherapy education.   PAST MEDICAL HISTORY: Past Medical History:  Diagnosis Date   Anemia    GERD (gastroesophageal reflux disease)    Hypercholesterolemia    Hypertension    Renal insufficiency     HEMATOLOGY/ONCOLOGY HISTORY:  Oncology History  Multiple myeloma ([place])  08/22/2021 Initial Diagnosis   Multiple myeloma ([place])   08/22/2021 - 08/22/2021 Chemotherapy   Patient is on Treatment Plan : MYELOMA NEWLY DIAGNOSED Daratumumab + Lenalidomide + Dexamethasone Weekly (DaraRd) q28d     08/22/2021 Cancer Staging   Staging form: Plasma Cell Myeloma and Plasma Cell Disorders, AJCC 8th Edition - Clinical stage from 08/22/2021: Albumin (g/dL): 2.8, ISS: Stage II, High-risk cytogenetics: Absent, LDH: Normal - Signed by Earlie Server, MD on 08/22/2021 Stage prefix: Initial diagnosis Albumin range (g/dL): Less than 3.5 Cytogenetics: 1q addition    08/30/2021 -  Chemotherapy   Patient is on Treatment Plan : MYELOMA Daratumumab and Hyaluronidase-fihj SQ q28d       ALLERGIES:  has No Known Allergies.  MEDICATIONS:  Current Outpatient Medications  Medication Sig Dispense Refill   acyclovir (ZOVIRAX) 400 MG tablet Take 1 tablet (400 mg total) by mouth 2 (two) times daily. 60 tablet 11   amLODipine (NORVASC) 2.5 MG tablet Take 10 mg by mouth daily.     ascorbic acid (VITAMIN C) 500 MG tablet Take 500 mg by mouth daily.     aspirin 81 MG EC tablet Take 81 mg by mouth daily.     cefdinir (OMNICEF) 300 MG  capsule Take 1 capsule (300 mg total) by mouth 2 (two) times daily. 20 capsule 0   donepezil (ARICEPT) 10 MG tablet Take 10 mg by mouth daily.     fenofibrate (TRICOR) 48 MG tablet Take 48 mg by mouth daily.     fexofenadine (ALLEGRA) 180 MG tablet Take by mouth. (Patient not taking: Reported on 08/22/2021)     hydrochlorothiazide (MICROZIDE) 12.5 MG capsule Take 12.5 mg by mouth daily.     lenalidomide (REVLIMID) 10 MG capsule Take 1 capsule (10 mg total) by mouth daily. Take for 21 days, then hold for 7 days. Repeat every 28 days. 21 capsule 0   losartan (COZAAR) 100 MG tablet Take 100 mg by mouth daily.     memantine (NAMENDA) 5 MG tablet Take 10 mg by mouth 2 (two) times daily.     metoprolol succinate (TOPROL-XL) 25 MG 24 hr tablet Take 50 mg by mouth daily.     Multiple Vitamin (MULTIVITAMIN) tablet Take 1 tablet by mouth daily.     omeprazole (PRILOSEC) 20 MG capsule Take 20 mg by mouth daily.     prochlorperazine (COMPAZINE) 10 MG tablet Take 1 tablet (10 mg total) by mouth every 6 (six) hours as needed (Nausea or vomiting). 30 tablet 1   No current facility-administered medications for this visit.    VITAL SIGNS: There were no vitals taken for this visit. There were no vitals filed for this visit.  Estimated body mass index is 26.59 kg/m as calculated from the following:  Height as of 08/26/21: _0  (1.575 m).   Weight as of 08/26/21: 66 kg (145 lb 6.4 oz).  LABS: CBC:    Component Value Date/Time   WBC 3.8 (L) 08/08/2021 0840   HGB 8.1 (L) 08/08/2021 0840   HCT 25.6 (L) 08/08/2021 0840   PLT 196 08/08/2021 0840   MCV 97.7 08/08/2021 0840   NEUTROABS 2.2 08/08/2021 0840   LYMPHSABS 1.3 08/08/2021 0840   MONOABS 0.3 08/08/2021 0840   EOSABS 0.1 08/08/2021 0840   BASOSABS 0.0 08/08/2021 0840   Comprehensive Metabolic Panel:    Component Value Date/Time   NA 141 07/24/2021 1212   K 3.5 07/24/2021 1212   CL 101 07/24/2021 1212   CO2 23 07/24/2021 1212   BUN 23  07/24/2021 1212   CREATININE 1.13 (H) 07/24/2021 1212   GLUCOSE 88 07/24/2021 1212   CALCIUM 10.2 07/24/2021 1212   AST 20 07/24/2021 1212   ALT 11 07/24/2021 1212   ALKPHOS 51 07/24/2021 1212   BILITOT 0.2 (L) 07/24/2021 1212   PROT 10.2 (H) 07/24/2021 1212   ALBUMIN 2.8 (L) 07/24/2021 1212     Present during today's visit: patient's daughters Clarene Critchley and Levada Dy  Lenalidomide start plan: Planned start on 08/30/21, pending lenalidomide delivery   Patient Education I spoke with patient for overview of new oral chemotherapy medication: lenalidomide   Administration: Counseled patient on administration, dosing, side effects, monitoring, drug-food interactions, safe handling, storage, and disposal. Patient will take lenalidomide 1 capsule (10 mg total) by mouth daily. Take for 21 days, then hold for 7 days. Repeat every 28 days.  She will also be taking:  Aspirin: Take 1 tablet (81 mg) by mouth daily. Acyclovir: Take 1 tablet (400 mg total) by mouth 2 (two) times daily. Prochlorperazine: 1 tablet (10 mg total) by mouth every 6 (six) hours as needed (Nausea or vomiting).  Side Effects: Side effects include but not limited to: rash, constipation or diarrhea, decreased wbc/hgb/plt, nausea.    Drug-drug Interactions (DDI): No current DDIs  Adherence: After discussion with patient no patient barriers to medication adherence identified.  Reviewed with patient importance of keeping a medication schedule and plan for any missed doses.  Clarene Critchley and Levada Dy voiced understanding and appreciation. All questions answered. Medication handout provided.  Provided patient with [facility] phone number. Patient knows to call the office with questions or concerns. [facility] will continue to follow.  Patient expressed understanding and was in agreement with this plan. She also understands that She can call clinic at any time with any questions,  concerns, or complaints.   Medication Access Issues: Pending [employer] delivery  Follow-up plan: RTC tomorrow  Thank you for allowing me to participate in the care of this patient.   Time Total: 30 mins  Visit consisted of counseling and education on dealing with issues of symptom management in the setting of serious and potentially life-threatening illness.Greater than 50%  of this time was spent counseling and coordinating care related to the above assessment and plan.  Signed by: Darl Pikes, PharmD, BCPS, Salley Slaughter, CPP Hematology/Oncology Clinical Pharmacist Practitioner [facility]/DB/AP [facility] [phone]  08/29/2021 11:35 AM

## 2021-08-30 ENCOUNTER — Inpatient Hospital Stay (HOSPITAL_BASED_OUTPATIENT_CLINIC_OR_DEPARTMENT_OTHER): Payer: Medicare Other | Admitting: Oncology

## 2021-08-30 ENCOUNTER — Other Ambulatory Visit: Payer: Self-pay

## 2021-08-30 ENCOUNTER — Inpatient Hospital Stay: Payer: Medicare Other

## 2021-08-30 ENCOUNTER — Encounter: Payer: Self-pay | Admitting: Oncology

## 2021-08-30 VITALS — BP 164/68 | HR 59 | Temp 97.4°F | Resp 18 | Wt 143.0 lb

## 2021-08-30 DIAGNOSIS — D649 Anemia, unspecified: Secondary | ICD-10-CM | POA: Insufficient documentation

## 2021-08-30 DIAGNOSIS — E8809 Other disorders of plasma-protein metabolism, not elsewhere classified: Secondary | ICD-10-CM | POA: Diagnosis not present

## 2021-08-30 DIAGNOSIS — C9000 Multiple myeloma not having achieved remission: Secondary | ICD-10-CM

## 2021-08-30 DIAGNOSIS — Z7961 Long term (current) use of immunomodulator: Secondary | ICD-10-CM | POA: Diagnosis not present

## 2021-08-30 DIAGNOSIS — N1831 Chronic kidney disease, stage 3a: Secondary | ICD-10-CM | POA: Diagnosis not present

## 2021-08-30 DIAGNOSIS — K5730 Diverticulosis of large intestine without perforation or abscess without bleeding: Secondary | ICD-10-CM | POA: Diagnosis not present

## 2021-08-30 DIAGNOSIS — Z5111 Encounter for antineoplastic chemotherapy: Secondary | ICD-10-CM | POA: Insufficient documentation

## 2021-08-30 DIAGNOSIS — Z5112 Encounter for antineoplastic immunotherapy: Secondary | ICD-10-CM | POA: Diagnosis not present

## 2021-08-30 DIAGNOSIS — E46 Unspecified protein-calorie malnutrition: Secondary | ICD-10-CM | POA: Diagnosis not present

## 2021-08-30 DIAGNOSIS — Z79899 Other long term (current) drug therapy: Secondary | ICD-10-CM | POA: Diagnosis not present

## 2021-08-30 DIAGNOSIS — Z8261 Family history of arthritis: Secondary | ICD-10-CM | POA: Diagnosis not present

## 2021-08-30 DIAGNOSIS — Z8249 Family history of ischemic heart disease and other diseases of the circulatory system: Secondary | ICD-10-CM | POA: Diagnosis not present

## 2021-08-30 DIAGNOSIS — E7800 Pure hypercholesterolemia, unspecified: Secondary | ICD-10-CM | POA: Diagnosis not present

## 2021-08-30 DIAGNOSIS — I129 Hypertensive chronic kidney disease with stage 1 through stage 4 chronic kidney disease, or unspecified chronic kidney disease: Secondary | ICD-10-CM | POA: Diagnosis not present

## 2021-08-30 DIAGNOSIS — I700 Atherosclerosis of aorta: Secondary | ICD-10-CM | POA: Diagnosis not present

## 2021-08-30 DIAGNOSIS — Z823 Family history of stroke: Secondary | ICD-10-CM | POA: Diagnosis not present

## 2021-08-30 DIAGNOSIS — Z803 Family history of malignant neoplasm of breast: Secondary | ICD-10-CM | POA: Diagnosis not present

## 2021-08-30 DIAGNOSIS — R5383 Other fatigue: Secondary | ICD-10-CM | POA: Diagnosis not present

## 2021-08-30 DIAGNOSIS — R0981 Nasal congestion: Secondary | ICD-10-CM | POA: Diagnosis not present

## 2021-08-30 LAB — TYPE AND SCREEN
ABO/RH(D): O POS
Antibody Screen: NEGATIVE

## 2021-08-30 LAB — COMPREHENSIVE METABOLIC PANEL
ALT: 12 U/L (ref 0–44)
AST: 20 U/L (ref 15–41)
Albumin: 2.8 g/dL — ABNORMAL LOW (ref 3.5–5.0)
Alkaline Phosphatase: 39 U/L (ref 38–126)
Anion gap: 14 (ref 5–15)
BUN: 21 mg/dL (ref 8–23)
CO2: 24 mmol/L (ref 22–32)
Calcium: 9.8 mg/dL (ref 8.9–10.3)
Chloride: 101 mmol/L (ref 98–111)
Creatinine, Ser: 1.19 mg/dL — ABNORMAL HIGH (ref 0.44–1.00)
GFR, Estimated: 45 mL/min — ABNORMAL LOW (ref >60)
Glucose, Bld: 113 mg/dL — ABNORMAL HIGH (ref 70–99)
Potassium: 3.3 mmol/L — ABNORMAL LOW (ref 3.5–5.1)
Sodium: 139 mmol/L (ref 135–145)
Total Bilirubin: 0.3 mg/dL (ref 0.3–1.2)
Total Protein: 10.1 g/dL — ABNORMAL HIGH (ref 6.5–8.1)

## 2021-08-30 LAB — PRETREATMENT RBC PHENOTYPE: DAT, IgG: NEGATIVE

## 2021-08-30 LAB — CBC WITH DIFFERENTIAL/PLATELET
Abs Immature Granulocytes: 0.01 10*3/uL (ref 0.00–0.07)
Basophils Absolute: 0 10*3/uL (ref 0.0–0.1)
Basophils Relative: 1 %
Eosinophils Absolute: 0.1 10*3/uL (ref 0.0–0.5)
Eosinophils Relative: 3 %
HCT: 24.7 % — ABNORMAL LOW (ref 36.0–46.0)
Hemoglobin: 7.7 g/dL — ABNORMAL LOW (ref 12.0–15.0)
Immature Granulocytes: 0 %
Lymphocytes Relative: 21 %
Lymphs Abs: 0.9 10*3/uL (ref 0.7–4.0)
MCH: 30.8 pg (ref 26.0–34.0)
MCHC: 31.2 g/dL (ref 30.0–36.0)
MCV: 98.8 fL (ref 80.0–100.0)
Monocytes Absolute: 0.3 10*3/uL (ref 0.1–1.0)
Monocytes Relative: 6 %
Neutro Abs: 3 10*3/uL (ref 1.7–7.7)
Neutrophils Relative %: 69 %
Platelets: 219 10*3/uL (ref 150–400)
RBC: 2.5 MIL/uL — ABNORMAL LOW (ref 3.87–5.11)
RDW: 17.2 % — ABNORMAL HIGH (ref 11.5–15.5)
WBC: 4.3 10*3/uL (ref 4.0–10.5)
nRBC: 0 % (ref 0.0–0.2)

## 2021-08-30 MED ORDER — DARATUMUMAB-HYALURONIDASE-FIHJ 1800-30000 MG-UT/15ML ~~LOC~~ SOLN
1800.0000 mg | Freq: Once | SUBCUTANEOUS | Status: AC
  Administered 2021-08-30: 1800 mg via SUBCUTANEOUS
  Filled 2021-08-30: qty 15

## 2021-08-30 MED ORDER — ACETAMINOPHEN 325 MG PO TABS
650.0000 mg | ORAL_TABLET | Freq: Once | ORAL | Status: AC
  Administered 2021-08-30: 650 mg via ORAL
  Filled 2021-08-30: qty 2

## 2021-08-30 MED ORDER — DIPHENHYDRAMINE HCL 25 MG PO CAPS
50.0000 mg | ORAL_CAPSULE | Freq: Once | ORAL | Status: AC
  Administered 2021-08-30: 50 mg via ORAL
  Filled 2021-08-30: qty 2

## 2021-08-30 MED ORDER — POTASSIUM CHLORIDE CRYS ER 10 MEQ PO TBCR: 10.0000 meq | EXTENDED_RELEASE_TABLET | Freq: Every day | ORAL | 0 refills | Status: DC

## 2021-08-30 MED ORDER — MONTELUKAST SODIUM 10 MG PO TABS
10.0000 mg | ORAL_TABLET | Freq: Once | ORAL | Status: AC
  Administered 2021-08-30: 10 mg via ORAL
  Filled 2021-08-30: qty 1

## 2021-08-30 MED ORDER — DEXAMETHASONE 4 MG PO TABS
20.0000 mg | ORAL_TABLET | Freq: Once | ORAL | Status: AC
  Administered 2021-08-30: 20 mg via ORAL
  Filled 2021-08-30: qty 5

## 2021-08-30 MED ORDER — MONTELUKAST SODIUM 10 MG PO TABS: 10.0000 mg | ORAL_TABLET | Freq: Every day | ORAL | 0 refills | Status: DC

## 2021-08-30 NOTE — Progress Notes (Signed)
Hematology/Oncology Progress note [phone] [phone]      Patient Care Team: Einar Pheasant, MD as PCP - General (Internal Medicine)  REFERRING PROVIDER: Einar Pheasant, MD  CHIEF COMPLAINTS/REASON FOR VISIT:  Follow-up for multiple myeloma  HISTORY OF PRESENTING ILLNESS:   Stephanie Delacruz is a  88 y.o.  female with PMH listed below was seen in consultation at the request of  Einar Pheasant, MD  for evaluation of anemia 07/15/2021, patient had CBC checked at the primary care provider's office.  CBC showed hemoglobin 7.7, hematocrit 23.9, white count 3.6, RDW 16.5.  Platelet count 166. Reviewed previous lab records.  Patient has chronic anemia dating back at least 2016. Her baseline prior to 2018 was about 10.   Hemoglobin was 9.1 in March 2022, and further dropped to 8.1 in October 2022, then November dropped to 7.7. Patient denies being significantly affected, tired, lightheadedness, shortness of breath.  Appetite is fair.  Denies any black or bloody stool.   Denies any history of hemoglobinopathy  08/26/2021 bone marrow biopsy showed hypercellular marrow with plasma cell neoplasm.  67% plasma cells in the aspirate.  Cytogenetics showed complex hyperplasia, gain of 1 q.  INTERVAL HISTORY Stephanie Delacruz is a 88 y.o. female who has above history reviewed by me today presents for follow up visit for management of multiple myeloma Patient took a dose of dexamethasone 20 mg last week.  Tolerates well. Patient was accompanied by her daughter Helene Kelp for evaluation of first cycle of daratumumab. Revlimid was approved and will be delivered to her today. Patient also had nasal congestion, mild nonproductive cough about 1-1/2 weeks ago.  She was seen by primary care provider and was prescribed an empiric course of Omnicef.  No fever, chills, nausea vomiting diarrhea. Chronic fatigue, not significantly worse.  Review of Systems  Constitutional:  Positive for fatigue.  Negative for appetite change, chills and fever.  HENT:   Negative for hearing loss and voice change.   Eyes:  Negative for eye problems.  Respiratory:  Negative for chest tightness and cough.   Cardiovascular:  Negative for chest pain.  Gastrointestinal:  Negative for abdominal distention, abdominal pain and blood in stool.  Endocrine: Negative for hot flashes.  Genitourinary:  Negative for difficulty urinating and frequency.   Musculoskeletal:  Negative for arthralgias.  Skin:  Negative for itching and rash.  Neurological:  Negative for extremity weakness.  Hematological:  Negative for adenopathy.  Psychiatric/Behavioral:  Negative for confusion.    MEDICAL HISTORY:  Past Medical History:  Diagnosis Date   Anemia    GERD (gastroesophageal reflux disease)    Hypercholesterolemia    Hypertension    Renal insufficiency     SURGICAL HISTORY: Past Surgical History:  Procedure Laterality Date   TONSILLECTOMY     TUBAL LIGATION      SOCIAL HISTORY: Social History   Socioeconomic History   Marital status: Married    Spouse name: Not on file   Number of children: Not on file   Years of education: Not on file   Highest education level: Not on file  Occupational History   Not on file  Tobacco Use   Smoking status: Never   Smokeless tobacco: Never  Substance and Sexual Activity   Alcohol use: No   Drug use: No   Sexual activity: Not on file  Other Topics Concern   Not on file  Social History Narrative   Not on file   Social Determinants of Health  Financial Resource Strain: Not on file  Food Insecurity: Not on file  Transportation Needs: Not on file  Physical Activity: Not on file  Stress: Not on file  Social Connections: Not on file  Intimate Partner Violence: Not on file    FAMILY HISTORY: Family History  Problem Relation Age of Onset   Hypertension Mother    Arthritis Mother    Hypertension Father    Stroke Father    Breast cancer Niece      ALLERGIES:  has No Known Allergies.  MEDICATIONS:  Current Outpatient Medications  Medication Sig Dispense Refill   amLODipine (NORVASC) 2.5 MG tablet Take 10 mg by mouth daily.     ascorbic acid (VITAMIN C) 500 MG tablet Take 500 mg by mouth daily.     aspirin 81 MG EC tablet Take 81 mg by mouth daily.     cefdinir (OMNICEF) 300 MG capsule Take 1 capsule (300 mg total) by mouth 2 (two) times daily. 20 capsule 0   donepezil (ARICEPT) 10 MG tablet Take 10 mg by mouth daily.     fenofibrate (TRICOR) 48 MG tablet Take 48 mg by mouth daily.     fexofenadine (ALLEGRA) 180 MG tablet Take by mouth.     hydrochlorothiazide (MICROZIDE) 12.5 MG capsule Take 12.5 mg by mouth daily.     losartan (COZAAR) 100 MG tablet Take 100 mg by mouth daily.     memantine (NAMENDA) 5 MG tablet Take 10 mg by mouth 2 (two) times daily.     metoprolol succinate (TOPROL-XL) 25 MG 24 hr tablet Take 50 mg by mouth daily.     montelukast (SINGULAIR) 10 MG tablet Take 1 tablet (10 mg total) by mouth daily. 30 tablet 0   Multiple Vitamin (MULTIVITAMIN) tablet Take 1 tablet by mouth daily.     omeprazole (PRILOSEC) 20 MG capsule Take 20 mg by mouth daily.     potassium chloride (KLOR-CON M) 10 MEQ tablet Take 1 tablet (10 mEq total) by mouth daily. 7 tablet 0   acyclovir (ZOVIRAX) 400 MG tablet Take 1 tablet (400 mg total) by mouth 2 (two) times daily. (Patient not taking: Reported on 08/30/2021) 60 tablet 11   lenalidomide (REVLIMID) 10 MG capsule Take 1 capsule (10 mg total) by mouth daily. Take for 21 days, then hold for 7 days. Repeat every 28 days. (Patient not taking: Reported on 08/30/2021) 21 capsule 0   prochlorperazine (COMPAZINE) 10 MG tablet Take 1 tablet (10 mg total) by mouth every 6 (six) hours as needed (Nausea or vomiting). (Patient not taking: Reported on 08/30/2021) 30 tablet 1   No current facility-administered medications for this visit.     PHYSICAL EXAMINATION: ECOG PERFORMANCE STATUS: 1 -  Symptomatic but completely ambulatory Vitals:   08/30/21 0932  BP: (!) 164/68  Pulse: (!) 59  Resp: 18  Temp: (!) 97.4 F (36.3 C)   Filed Weights   08/30/21 0932  Weight: 143 lb (64.9 kg)    Physical Exam Constitutional:      General: She is not in acute distress. HENT:     Head: Normocephalic and atraumatic.  Eyes:     General: No scleral icterus. Cardiovascular:     Rate and Rhythm: Normal rate and regular rhythm.     Heart sounds: Normal heart sounds.  Pulmonary:     Effort: Pulmonary effort is normal. No respiratory distress.     Breath sounds: No wheezing.  Abdominal:     General: Bowel sounds are  normal. There is no distension.     Palpations: Abdomen is soft.  Musculoskeletal:        General: No deformity. Normal range of motion.     Cervical back: Normal range of motion and neck supple.  Skin:    General: Skin is warm and dry.     Findings: No erythema or rash.  Neurological:     Mental Status: She is alert and oriented to person, place, and time. Mental status is at baseline.     Cranial Nerves: No cranial nerve deficit.     Coordination: Coordination normal.  Psychiatric:        Mood and Affect: Mood normal.    LABORATORY DATA:  I have reviewed the data as listed Lab Results  Component Value Date   WBC 4.3 08/30/2021   HGB 7.7 (L) 08/30/2021   HCT 24.7 (L) 08/30/2021   MCV 98.8 08/30/2021   PLT 219 08/30/2021   Recent Labs    06/27/21 0940 07/15/21 1117 07/24/21 1212 08/30/21 0901  NA 142 143 141 139  K 3.8 3.6 3.5 3.3*  CL 106 103 101 101  CO2 _0 GLUCOSE 90 84 88 113*  BUN _1 CREATININE 1.20* 1.09 1.13* 1.19*  CALCIUM 10.1 10.2 10.2 9.8  GFRNONAA 44*  --  48* 45*  PROT 9.8*  --  10.2* 10.1*  ALBUMIN 2.8*  --  2.8* 2.8*  AST 20  --  20 20  ALT 13  --  11 12  ALKPHOS 39  --  51 39  BILITOT 0.5  --  0.2* 0.3  BILIDIR <0.1  --   --   --   IBILI NOT CALCULATED  --   --   --     Iron/TIBC/Ferritin/ %Sat     Component Value Date/Time   IRON 54 07/24/2021 1212   TIBC 277 07/24/2021 1212   FERRITIN 93 07/24/2021 1212   IRONPCTSAT 20 07/24/2021 1212       RADIOGRAPHIC STUDIES: I have personally reviewed the radiological images as listed and agreed with the findings in the report. NM PET Image Initial (PI) Whole Body  Result Date: 08/28/2021 CLINICAL DATA:  Initial treatment strategy for multiple myeloma. EXAM: NUCLEAR MEDICINE PET WHOLE BODY TECHNIQUE: 7.88 mCi F-18 FDG was injected intravenously. Full-ring PET imaging was performed from the head to foot after the radiotracer. CT data was obtained and used for attenuation correction and anatomic localization. Fasting blood glucose: 79 mg/dl COMPARISON:  None. FINDINGS: Mediastinal blood pool activity: SUV max 2.49 HEAD/NECK: No hypermetabolic activity in the scalp. No hypermetabolic cervical lymph nodes. Incidental CT findings: none CHEST: No tracer avid supraclavicular axillary, mediastinal, or hilar lymph nodes. No suspicious pulmonary nodule. Incidental CT findings: Aortic atherosclerosis and coronary artery calcifications. Mild cardiac enlargement. ABDOMEN/PELVIS: No abnormal hypermetabolic activity within the liver, pancreas, adrenal glands, or spleen. No hypermetabolic lymph nodes in the abdomen or pelvis. Incidental CT findings: Aortic atherosclerosis. No aneurysm. Sigmoid diverticulosis. No signs of acute diverticulitis. SKELETON: Multifocal tracer avid lucent bone lesions are identified within the axial and proximal appendicular skeleton compatible with metabolically active lesions of multiple myeloma: -Index lesion within the L3 vertebral body has an SUV max 6.68. -Lesion with pathologic fracture involving the T11 vertebra has an SUV max of 4.92. -2 cm lucent lesion within the left iliac wing has an SUV max of 3.42, image 212/4. Incidental CT findings: none EXTREMITIES: -Tracer avid lesion within the proximal right  femur at the level of the lesser  trochanter has an SUV max of 4.63, image 266/4. -mild focal tracer uptake is identified within the proximal diaphysis of the left humerus with SUV max of 2.3. Incidental CT findings: none IMPRESSION: 1. Examination is positive for multifocal tracer avid bone lesions compatible with metabolically active lesions of multiple myeloma. 2. No signs of tracer avid adenopathy or mass. Electronically Signed   By: Kerby Moors M.D.   On: 08/28/2021 14:54   CT BONE MARROW BIOPSY & ASPIRATION  Result Date: 08/08/2021 INDICATION: Anemia and myeloma workup. EXAM: CT GUIDED BONE MARROW ASPIRATES AND BIOPSY Physician: Stephan Minister. Henn, MD MEDICATIONS: Moderate sedation ANESTHESIA/SEDATION: Moderate (conscious) sedation was employed during this procedure. A total of Versed 0.55m and fentanyl 25 mcg was administered intravenously at the order of the provider performing the procedure. Total intra-service moderate sedation time: 12 minutes. Patient's level of consciousness and vital signs were monitored continuously by radiology nurse throughout the procedure under the supervision of the provider performing the procedure. COMPLICATIONS: None immediate. PROCEDURE: The procedure was explained to the patient and patient's family. The risks and benefits of the procedure were discussed and the patient's questions were addressed. Informed consent was obtained from the patient. The patient was placed prone on CT table. Images of the pelvis were obtained. The right side of back was prepped and draped in sterile fashion. The skin and right posterior ilium were anesthetized with 1% lidocaine. 11 gauge bone needle was directed into the right ilium with CT guidance. Two aspirates and one core biopsy were obtained. Bandage placed over the puncture site. FINDINGS: CT imaging through the pelvis demonstrates a low-density and slightly expansile lesion at the left iliac wing that measures up to 2.0 cm. This is seen on sequence 2 image 10. Biopsy  needle was directed in the posterior right ilium. IMPRESSION: 1. CT-guided bone marrow biopsy and aspiration. 2. Suspicious bone lesion in the left iliac wing. This lesion could be associated with myeloma. These results will be called to the ordering clinician or representative by the Radiologist Assistant, and communication documented in the PACS or [employer] Electronically Signed   By: AMarkus DaftM.D.   On: 08/08/2021 13:19      ASSESSMENT & PLAN:  1. Multiple [place] not having achieved remission ([place]   2. Normocytic anemia   3. Stage 3a chronic kidney disease ([place]   4. Encounter for antineoplastic chemotherapy    Cancer Staging  [place] Staging form: Plasma Cell Myeloma and Plasma Cell Disorders, AJCC 8th Edition - Clinical stage from 08/22/2021: Albumin (g/dL): 2.8, ISS: Stage II, High-risk cytogenetics: Absent, LDH: Normal - Signed by YEarlie Server MD on 08/22/2021    #Multiple myeloma, IgA kappa M protein of 5.2, corrected calcium of 11.2, severe anemia 7.7.  Creatinine 1.13. Labs reviewed and discussed with patient. Proceed with cycle 1 day 1 daratumumab injection.  Dexamethasone 20 mg x 1 today. Patient to take Singulair 10 mg daily to next visit. Revlimid will be delivered today.  I asked patient to hold off starting due to cytopenia.  #Multiple bone lesions identified on PET scan.  No signs of adenopathy or mass. #Anemia, secondary to multiple myeloma. Patient does not feel more fatigued than her chronic baseline.  Close monitor.  #Chronic kidney disease, recommend patient to avoid nephrotoxin.  Encourage oral hydration.  Supportive care measures are necessary for patient well-being and will be provided as necessary. We spent sufficient time to discuss many  aspect of care, questions were answered to patient's satisfaction.     All questions were answered. The patient knows to call the clinic with any problems questions or concerns.  cc Einar Pheasant,  MD    Return of visit: 1week for lab md chemo  Earlie Server, MD, PhD [facility] Hematology Oncology 08/30/2021

## 2021-08-30 NOTE — Patient Instructions (Signed)
[facility]  Discharge Instructions: ?Thank you for choosing [facility] to provide your oncology and hematology care.  ?If you have a lab appointment with the Cancer Center, please go directly to the Cancer Center and check in at the registration area. ? ?Wear comfortable clothing and clothing appropriate for easy access to any Portacath or PICC line.  ? ?We strive to give you quality time with your provider. You may need to reschedule your appointment if you arrive late (15 or more minutes).  Arriving late affects you and other patients whose appointments are after yours.  Also, if you miss three or more appointments without notifying the office, you may be dismissed from the clinic at the provider?s discretion.    ?  ?For prescription refill requests, have your pharmacy contact our office and allow 72 hours for refills to be completed.   ? ?Today you received the following chemotherapy and/or immunotherapy agents DARZALEX    ?  ?To help prevent nausea and vomiting after your treatment, we encourage you to take your nausea medication as directed. ? ?BELOW ARE SYMPTOMS THAT SHOULD BE REPORTED IMMEDIATELY: ?*FEVER GREATER THAN 100.4 F (38 ?C) OR HIGHER ?*CHILLS OR SWEATING ?*NAUSEA AND VOMITING THAT IS NOT CONTROLLED WITH YOUR NAUSEA MEDICATION ?*UNUSUAL SHORTNESS OF BREATH ?*UNUSUAL BRUISING OR BLEEDING ?*URINARY PROBLEMS (pain or burning when urinating, or frequent urination) ?*BOWEL PROBLEMS (unusual diarrhea, constipation, pain near the anus) ?TENDERNESS IN MOUTH AND THROAT WITH OR WITHOUT PRESENCE OF ULCERS (sore throat, sores in mouth, or a toothache) ?UNUSUAL RASH, SWELLING OR PAIN  ?UNUSUAL VAGINAL DISCHARGE OR ITCHING  ? ?Items with * indicate a potential emergency and should be followed up as soon as possible or go to the Emergency Department if any problems should occur. ? ?Please show the CHEMOTHERAPY ALERT CARD or IMMUNOTHERAPY ALERT CARD at check-in to  the Emergency Department and triage nurse. ? ?Should you have questions after your visit or need to cancel or reschedule your appointment, please contact [facility]  [phone] and follow the prompts.  Office hours are 8:00 a.m. to 4:30 p.m. Monday - Friday. Please note that voicemails left after 4:00 p.m. may not be returned until the following business day.  We are closed weekends and major holidays. You have access to a nurse at all times for urgent questions. Please call the main number to the clinic [phone] and follow the prompts. ? ?For any non-urgent questions, you may also contact your provider using MyChart. We now offer e-Visits for anyone 18 and older to request care online for non-urgent symptoms. For details visit [email or web address]. ?  ?Also download the MyChart app! Go to the app store, search "MyChart", open the app, select , and log in with your MyChart username and password. ? ?Due to Covid, a mask is required upon entering the hospital/clinic. If you do not have a mask, one will be given to you upon arrival. For doctor visits, patients may have 1 support person aged 18 or older with them. For treatment visits, patients cannot have anyone with them due to current Covid guidelines and our immunocompromised population.  ? ?Daratumumab injection ?What is this medication? ?DARATUMUMAB (dar a toom ue mab) is a monoclonal antibody. It is used to treat multiple myeloma. ?This medicine may be used for other purposes; ask your health care provider or pharmacist if you have questions. ?COMMON BRAND NAME(S): DARZALEX ?What should I tell my care team before I   take this medication? ?They need to know if you have any of these conditions: ?hereditary fructose intolerance ?infection (especially a virus infection such as chickenpox, herpes, or hepatitis B virus) ?lung or breathing disease (asthma, COPD) ?an unusual or allergic reaction to daratumumab,  sorbitol, other medicines, foods, dyes, or preservatives ?pregnant or trying to get pregnant ?breast-feeding ?How should I use this medication? ?This medicine is for infusion into a vein. It is given by a health care professional in a hospital or clinic setting. ?Talk to your pediatrician regarding the use of this medicine in children. Special care may be needed. ?Overdosage: If you think you have taken too much of this medicine contact a poison control center or emergency room at once. ?NOTE: This medicine is only for you. Do not share this medicine with others. ?What if I miss a dose? ?Keep appointments for follow-up doses as directed. It is important not to miss your dose. Call your doctor or health care professional if you are unable to keep an appointment. ?What may interact with this medication? ?Interactions have not been studied. ?This list may not describe all possible interactions. Give your health care provider a list of all the medicines, herbs, non-prescription drugs, or dietary supplements you use. Also tell them if you smoke, drink alcohol, or use illegal drugs. Some items may interact with your medicine. ?What should I watch for while using this medication? ?Your condition will be monitored carefully while you are receiving this medicine. ?This medicine can cause serious allergic reactions. To reduce your risk, your health care provider may give you other medicine to take before receiving this one. Be sure to follow the directions from your health care provider. ?This medicine can affect the results of blood tests to match your blood type. These changes can last for up to 6 months after the final dose. Your healthcare provider will do blood tests to match your blood type before you start treatment. Tell all of your healthcare providers that you are being treated with this medicine before receiving a blood transfusion. ?This medicine can affect the results of some tests used to determine treatment  response; extra tests may be needed to evaluate response. ?Do not become pregnant while taking this medicine or for 3 months after stopping it. Women should inform their health care provider if they wish to become pregnant or think they might be pregnant. There is a potential for serious side effects to an unborn child. Talk to your health care provider for more information. Do not breast-feed an infant while taking this medicine. ?What side effects may I notice from receiving this medication? ?Side effects that you should report to your care team as soon as possible: ?Allergic reactions--skin rash, itching or hives, swelling of the face, lips, or tongue ?Blurred vision ?Infection--fever, chills, cough, sore throat, pain or trouble passing urine ?Infusion reactions--dizziness, fast heartbeat, feeling faint or lightheaded, falls, headache, increase in blood pressure, nausea, vomiting, or wheezing or trouble breathing with loud or whistling sounds ?Unusual bleeding or bruising ?Side effects that usually do not require medical attention (report to your care team if they continue or are bothersome): ?Constipation ?Diarrhea ?Pain, tingling, numbness in the hands or feet ?Swelling of the ankles, feet, hands ?Tiredness ?This list may not describe all possible side effects. Call your doctor for medical advice about side effects. You may report side effects to FDA at 1-800-FDA-1088. ?Where should I keep my medication? ?This drug is given in a hospital or clinic and will not   be stored at home. ?NOTE: This sheet is a summary. It may not cover all possible information. If you have questions about this medicine, talk to your doctor, pharmacist, or health care provider. ?? 2022 Elsevier/Gold Standard (2020-12-20 00:00:00) ? ?

## 2021-08-30 NOTE — Progress Notes (Signed)
Patient here for new tx Sub Q Dara. Pt was started on antibiotics earlier this week for URI, antibiotic prescribed for 10 days. Per Helene Kelp, daughter, patient was tested for flu, RSV and covid and results were negative.

## 2021-08-31 ENCOUNTER — Encounter: Payer: Self-pay | Admitting: Oncology

## 2021-08-31 LAB — BETA 2 MICROGLOBULIN, SERUM: Beta-2 Microglobulin: 4.1 mg/L — ABNORMAL HIGH (ref 0.6–2.4)

## 2021-09-02 LAB — KAPPA/LAMBDA LIGHT CHAINS
Kappa free light chain: 93.4 mg/L — ABNORMAL HIGH (ref 3.3–19.4)
Kappa, lambda light chain ratio: 16.1 — ABNORMAL HIGH (ref 0.26–1.65)
Lambda free light chains: 5.8 mg/L (ref 5.7–26.3)

## 2021-09-02 NOTE — Telephone Encounter (Signed)
Oral Oncology Patient Advocate Encounter  Was successful in securing patient a $12000 grant from [facility] to provide copayment coverage for Revlimid.  This will keep the out of pocket expense at $0.     [employer] ID: [id number]  I have spoken with the patient.   The billing information is as follows and has been shared with [employer].    RxBin: [id number] PCN: [id number] Member ID: [id number] Group ID: [id number] Dates of Eligibility: 07/28/21 through 07/27/22  Fund:  [place] [facility] [place] Phone [phone] Fax [phone] 09/02/2021 1:24 PM

## 2021-09-03 ENCOUNTER — Other Ambulatory Visit: Payer: Self-pay | Admitting: Oncology

## 2021-09-03 LAB — MULTIPLE MYELOMA PANEL, SERUM
Albumin SerPl Elph-Mcnc: 3.2 g/dL (ref 2.9–4.4)
Albumin/Glob SerPl: 0.5 — ABNORMAL LOW (ref 0.7–1.7)
Alpha 1: 0.3 g/dL (ref 0.0–0.4)
Alpha2 Glob SerPl Elph-Mcnc: 0.6 g/dL (ref 0.4–1.0)
B-Globulin SerPl Elph-Mcnc: 5.1 g/dL — ABNORMAL HIGH (ref 0.7–1.3)
Gamma Glob SerPl Elph-Mcnc: 0.6 g/dL (ref 0.4–1.8)
Globulin, Total: 6.5 g/dL — ABNORMAL HIGH (ref 2.2–3.9)
IgA: 4239 mg/dL — ABNORMAL HIGH (ref 64–422)
IgG (Immunoglobin G), Serum: 451 mg/dL — ABNORMAL LOW (ref 586–1602)
IgM (Immunoglobulin M), Srm: 11 mg/dL — ABNORMAL LOW (ref 26–217)
M Protein SerPl Elph-Mcnc: 4.9 g/dL — ABNORMAL HIGH
Total Protein ELP: 9.7 g/dL — ABNORMAL HIGH (ref 6.0–8.5)

## 2021-09-03 NOTE — Telephone Encounter (Signed)
°  Component Ref Range & Units 4 d ago (08/30/21) 1 mo ago (07/24/21) 1 mo ago (07/15/21) 2 mo ago (06/27/21) 2 mo ago (06/27/21)  Potassium 3.5 - 5.1 mmol/L 3.3 Low   3.5  3.6 R  3.8

## 2021-09-06 ENCOUNTER — Inpatient Hospital Stay (HOSPITAL_BASED_OUTPATIENT_CLINIC_OR_DEPARTMENT_OTHER): Payer: Medicare Other | Admitting: Oncology

## 2021-09-06 ENCOUNTER — Other Ambulatory Visit: Payer: Self-pay | Admitting: Oncology

## 2021-09-06 ENCOUNTER — Inpatient Hospital Stay: Payer: Medicare Other

## 2021-09-06 ENCOUNTER — Encounter: Payer: Self-pay | Admitting: Internal Medicine

## 2021-09-06 ENCOUNTER — Encounter: Payer: Self-pay | Admitting: Pharmacist

## 2021-09-06 ENCOUNTER — Encounter: Payer: Self-pay | Admitting: Oncology

## 2021-09-06 ENCOUNTER — Other Ambulatory Visit: Payer: Self-pay

## 2021-09-06 VITALS — BP 158/66 | HR 67 | Temp 97.7°F | Resp 18 | Wt 143.6 lb

## 2021-09-06 VITALS — BP 143/68 | HR 68 | Resp 19

## 2021-09-06 DIAGNOSIS — I129 Hypertensive chronic kidney disease with stage 1 through stage 4 chronic kidney disease, or unspecified chronic kidney disease: Secondary | ICD-10-CM | POA: Diagnosis not present

## 2021-09-06 DIAGNOSIS — C9000 Multiple myeloma not having achieved remission: Secondary | ICD-10-CM | POA: Diagnosis not present

## 2021-09-06 DIAGNOSIS — R0981 Nasal congestion: Secondary | ICD-10-CM | POA: Diagnosis not present

## 2021-09-06 DIAGNOSIS — R5383 Other fatigue: Secondary | ICD-10-CM | POA: Diagnosis not present

## 2021-09-06 DIAGNOSIS — D649 Anemia, unspecified: Secondary | ICD-10-CM

## 2021-09-06 DIAGNOSIS — E7800 Pure hypercholesterolemia, unspecified: Secondary | ICD-10-CM | POA: Diagnosis not present

## 2021-09-06 DIAGNOSIS — K5730 Diverticulosis of large intestine without perforation or abscess without bleeding: Secondary | ICD-10-CM | POA: Diagnosis not present

## 2021-09-06 DIAGNOSIS — I700 Atherosclerosis of aorta: Secondary | ICD-10-CM | POA: Diagnosis not present

## 2021-09-06 DIAGNOSIS — Z5111 Encounter for antineoplastic chemotherapy: Secondary | ICD-10-CM

## 2021-09-06 DIAGNOSIS — Z5112 Encounter for antineoplastic immunotherapy: Secondary | ICD-10-CM | POA: Diagnosis not present

## 2021-09-06 DIAGNOSIS — Z8249 Family history of ischemic heart disease and other diseases of the circulatory system: Secondary | ICD-10-CM | POA: Diagnosis not present

## 2021-09-06 DIAGNOSIS — N1831 Chronic kidney disease, stage 3a: Secondary | ICD-10-CM

## 2021-09-06 DIAGNOSIS — Z803 Family history of malignant neoplasm of breast: Secondary | ICD-10-CM | POA: Diagnosis not present

## 2021-09-06 DIAGNOSIS — E8809 Other disorders of plasma-protein metabolism, not elsewhere classified: Secondary | ICD-10-CM | POA: Diagnosis not present

## 2021-09-06 DIAGNOSIS — Z79899 Other long term (current) drug therapy: Secondary | ICD-10-CM | POA: Diagnosis not present

## 2021-09-06 DIAGNOSIS — Z7961 Long term (current) use of immunomodulator: Secondary | ICD-10-CM | POA: Diagnosis not present

## 2021-09-06 DIAGNOSIS — E46 Unspecified protein-calorie malnutrition: Secondary | ICD-10-CM | POA: Diagnosis not present

## 2021-09-06 LAB — CBC WITH DIFFERENTIAL/PLATELET
Abs Immature Granulocytes: 0.01 10*3/uL (ref 0.00–0.07)
Basophils Absolute: 0 10*3/uL (ref 0.0–0.1)
Basophils Relative: 1 %
Eosinophils Absolute: 0.1 10*3/uL (ref 0.0–0.5)
Eosinophils Relative: 1 %
HCT: 25.3 % — ABNORMAL LOW (ref 36.0–46.0)
Hemoglobin: 7.8 g/dL — ABNORMAL LOW (ref 12.0–15.0)
Immature Granulocytes: 0 %
Lymphocytes Relative: 14 %
Lymphs Abs: 0.5 10*3/uL — ABNORMAL LOW (ref 0.7–4.0)
MCH: 30.7 pg (ref 26.0–34.0)
MCHC: 30.8 g/dL (ref 30.0–36.0)
MCV: 99.6 fL (ref 80.0–100.0)
Monocytes Absolute: 0.4 10*3/uL (ref 0.1–1.0)
Monocytes Relative: 10 %
Neutro Abs: 2.7 10*3/uL (ref 1.7–7.7)
Neutrophils Relative %: 74 %
Platelets: 182 10*3/uL (ref 150–400)
RBC: 2.54 MIL/uL — ABNORMAL LOW (ref 3.87–5.11)
RDW: 17.2 % — ABNORMAL HIGH (ref 11.5–15.5)
WBC: 3.6 10*3/uL — ABNORMAL LOW (ref 4.0–10.5)
nRBC: 0 % (ref 0.0–0.2)

## 2021-09-06 LAB — COMPREHENSIVE METABOLIC PANEL
ALT: 15 U/L (ref 0–44)
AST: 24 U/L (ref 15–41)
Albumin: 2.6 g/dL — ABNORMAL LOW (ref 3.5–5.0)
Alkaline Phosphatase: 62 U/L (ref 38–126)
Anion gap: 12 (ref 5–15)
BUN: 23 mg/dL (ref 8–23)
CO2: 23 mmol/L (ref 22–32)
Calcium: 9.7 mg/dL (ref 8.9–10.3)
Chloride: 104 mmol/L (ref 98–111)
Creatinine, Ser: 1.15 mg/dL — ABNORMAL HIGH (ref 0.44–1.00)
GFR, Estimated: 47 mL/min — ABNORMAL LOW (ref >60)
Glucose, Bld: 90 mg/dL (ref 70–99)
Potassium: 4 mmol/L (ref 3.5–5.1)
Sodium: 139 mmol/L (ref 135–145)
Total Bilirubin: 0.3 mg/dL (ref 0.3–1.2)
Total Protein: 9.5 g/dL — ABNORMAL HIGH (ref 6.5–8.1)

## 2021-09-06 LAB — SAMPLE TO BLOOD BANK

## 2021-09-06 MED ORDER — ACETAMINOPHEN 325 MG PO TABS
650.0000 mg | ORAL_TABLET | Freq: Once | ORAL | Status: AC
  Administered 2021-09-06: 650 mg via ORAL
  Filled 2021-09-06: qty 2

## 2021-09-06 MED ORDER — DARATUMUMAB-HYALURONIDASE-FIHJ 1800-30000 MG-UT/15ML ~~LOC~~ SOLN
1800.0000 mg | Freq: Once | SUBCUTANEOUS | Status: AC
  Administered 2021-09-06: 1800 mg via SUBCUTANEOUS
  Filled 2021-09-06: qty 15

## 2021-09-06 MED ORDER — DEXAMETHASONE 4 MG PO TABS
20.0000 mg | ORAL_TABLET | Freq: Once | ORAL | Status: AC
  Administered 2021-09-06: 20 mg via ORAL
  Filled 2021-09-06: qty 5

## 2021-09-06 MED ORDER — DIPHENHYDRAMINE HCL 25 MG PO CAPS
50.0000 mg | ORAL_CAPSULE | Freq: Once | ORAL | Status: AC
  Administered 2021-09-06: 50 mg via ORAL
  Filled 2021-09-06: qty 2

## 2021-09-06 NOTE — Progress Notes (Signed)
Labs reviewed with MD and treatment team. Per MD to proceed with treatment.   Pt observed for 1 hour post Darzalex injection per protocol. Injection site WDL. VSS. Pt stable for discharge.   [employer]

## 2021-09-06 NOTE — Progress Notes (Signed)
Pt here for follow up. No new concerns voiced.   

## 2021-09-06 NOTE — Progress Notes (Signed)
Hematology/Oncology Progress note [phone] [phone]         Patient Care Team: Einar Pheasant, MD as PCP - General (Internal Medicine)  REFERRING PROVIDER: Einar Pheasant, MD  CHIEF COMPLAINTS/REASON FOR VISIT:  Follow-up for multiple myeloma  HISTORY OF PRESENTING ILLNESS:   Stephanie Delacruz is a  88 y.o.  female with PMH listed below was seen in consultation at the request of  Einar Pheasant, MD  for evaluation of anemia 07/15/2021, patient had CBC checked at the primary care provider's office.  CBC showed hemoglobin 7.7, hematocrit 23.9, white count 3.6, RDW 16.5.  Platelet count 166. Reviewed previous lab records.  Patient has chronic anemia dating back at least 2016. Her baseline prior to 2018 was about 10.   Hemoglobin was 9.1 in March 2022, and further dropped to 8.1 in October 2022, then November dropped to 7.7. Patient denies being significantly affected, tired, lightheadedness, shortness of breath.  Appetite is fair.  Denies any black or bloody stool.   Denies any history of hemoglobinopathy  08/26/2021 bone marrow biopsy showed hypercellular marrow with plasma cell neoplasm.  67% plasma cells in the aspirate.  Cytogenetics showed complex hyperplasia, gain of 1 q.  INTERVAL HISTORY Stephanie Delacruz is a 88 y.o. female who has above history reviewed by me today presents for follow up visit for management of multiple myeloma Status post Daratumumab treatment last week.  She tolerates well.  Patient has taken singular 10 mg today. She was accompanied by her family members.  Patient has no new complaints. Appetite is fair.  Her weight is stable.  Review of Systems  Constitutional:  Positive for fatigue. Negative for appetite change, chills and fever.  HENT:   Negative for hearing loss and voice change.   Eyes:  Negative for eye problems.  Respiratory:  Negative for chest tightness and cough.   Cardiovascular:  Negative for chest pain.   Gastrointestinal:  Negative for abdominal distention, abdominal pain and blood in stool.  Endocrine: Negative for hot flashes.  Genitourinary:  Negative for difficulty urinating and frequency.   Musculoskeletal:  Negative for arthralgias.  Skin:  Negative for itching and rash.  Neurological:  Negative for extremity weakness.  Hematological:  Negative for adenopathy.  Psychiatric/Behavioral:  Negative for confusion.    MEDICAL HISTORY:  Past Medical History:  Diagnosis Date   Anemia    GERD (gastroesophageal reflux disease)    Hypercholesterolemia    Hypertension    Renal insufficiency     SURGICAL HISTORY: Past Surgical History:  Procedure Laterality Date   TONSILLECTOMY     TUBAL LIGATION      SOCIAL HISTORY: Social History   Socioeconomic History   Marital status: Married    Spouse name: Not on file   Number of children: Not on file   Years of education: Not on file   Highest education level: Not on file  Occupational History   Not on file  Tobacco Use   Smoking status: Never   Smokeless tobacco: Never  Substance and Sexual Activity   Alcohol use: No   Drug use: No   Sexual activity: Not on file  Other Topics Concern   Not on file  Social History Narrative   Not on file   Social Determinants of Health   Financial Resource Strain: Not on file  Food Insecurity: Not on file  Transportation Needs: Not on file  Physical Activity: Not on file  Stress: Not on file  Social Connections: Not  on file  Intimate Partner Violence: Not on file    FAMILY HISTORY: Family History  Problem Relation Age of Onset   Hypertension Mother    Arthritis Mother    Hypertension Father    Stroke Father    Breast cancer Niece     ALLERGIES:  has No Known Allergies.  MEDICATIONS:  Current Outpatient Medications  Medication Sig Dispense Refill   acyclovir (ZOVIRAX) 400 MG tablet Take 1 tablet (400 mg total) by mouth 2 (two) times daily. 60 tablet 11   amLODipine  (NORVASC) 2.5 MG tablet Take 10 mg by mouth daily.     ascorbic acid (VITAMIN C) 500 MG tablet Take 500 mg by mouth daily.     aspirin 81 MG EC tablet Take 81 mg by mouth daily.     donepezil (ARICEPT) 10 MG tablet Take 10 mg by mouth daily.     fenofibrate (TRICOR) 48 MG tablet Take 48 mg by mouth daily.     fexofenadine (ALLEGRA) 180 MG tablet Take by mouth.     hydrochlorothiazide (MICROZIDE) 12.5 MG capsule Take 12.5 mg by mouth daily.     losartan (COZAAR) 100 MG tablet Take 100 mg by mouth daily.     memantine (NAMENDA) 5 MG tablet Take 10 mg by mouth 2 (two) times daily.     metoprolol succinate (TOPROL-XL) 25 MG 24 hr tablet Take 50 mg by mouth daily.     montelukast (SINGULAIR) 10 MG tablet Take 1 tablet (10 mg total) by mouth daily. 30 tablet 0   Multiple Vitamins-Minerals (WOMENS MULTIVITAMIN PO) Take 1 tablet by mouth daily.     omeprazole (PRILOSEC) 20 MG capsule Take 20 mg by mouth daily.     potassium chloride (KLOR-CON M) 10 MEQ tablet Take 1 tablet (10 mEq total) by mouth daily. 7 tablet 0   prochlorperazine (COMPAZINE) 10 MG tablet Take 1 tablet (10 mg total) by mouth every 6 (six) hours as needed (Nausea or vomiting). 30 tablet 1   lenalidomide (REVLIMID) 10 MG capsule Take 1 capsule (10 mg total) by mouth daily. Take for 21 days, then hold for 7 days. Repeat every 28 days. (Patient not taking: Reported on 08/30/2021) 21 capsule 0   No current facility-administered medications for this visit.     PHYSICAL EXAMINATION: ECOG PERFORMANCE STATUS: 1 - Symptomatic but completely ambulatory Vitals:   09/06/21 0834  BP: (!) 158/66  Pulse: 67  Resp: 18  Temp: 97.7 F (36.5 C)   Filed Weights   09/06/21 0834  Weight: 143 lb 9.6 oz (65.1 kg)    Physical Exam Constitutional:      General: She is not in acute distress. HENT:     Head: Normocephalic and atraumatic.  Eyes:     General: No scleral icterus. Cardiovascular:     Rate and Rhythm: Normal rate and regular  rhythm.     Heart sounds: Normal heart sounds.  Pulmonary:     Effort: Pulmonary effort is normal. No respiratory distress.     Breath sounds: No wheezing.  Abdominal:     General: Bowel sounds are normal. There is no distension.     Palpations: Abdomen is soft.  Musculoskeletal:        General: No deformity. Normal range of motion.     Cervical back: Normal range of motion and neck supple.  Skin:    General: Skin is warm and dry.     Findings: No erythema or rash.  Neurological:  Mental Status: She is alert and oriented to person, place, and time. Mental status is at baseline.     Cranial Nerves: No cranial nerve deficit.     Coordination: Coordination normal.  Psychiatric:        Mood and Affect: Mood normal.    LABORATORY DATA:  I have reviewed the data as listed Lab Results  Component Value Date   WBC 3.6 (L) 09/06/2021   HGB 7.8 (L) 09/06/2021   HCT 25.3 (L) 09/06/2021   MCV 99.6 09/06/2021   PLT 182 09/06/2021   Recent Labs    06/27/21 0940 07/15/21 1117 07/24/21 1212 08/30/21 0901 09/06/21 0814  NA 142   < > 141 139 139  K 3.8   < > 3.5 3.3* 4.0  CL 106   < > 101 101 104  CO2 25   < > '23 24 23  ' GLUCOSE 90   < > 88 113* 90  BUN 19   < > '23 21 23  ' CREATININE 1.20*   < > 1.13* 1.19* 1.15*  CALCIUM 10.1   < > 10.2 9.8 9.7  GFRNONAA 44*  --  48* 45* 47*  PROT 9.8*  --  10.2* 10.1* 9.5*  ALBUMIN 2.8*  --  2.8* 2.8* 2.6*  AST 20  --  '20 20 24  ' ALT 13  --  '11 12 15  ' ALKPHOS 39  --  51 39 62  BILITOT 0.5  --  0.2* 0.3 0.3  BILIDIR <0.1  --   --   --   --   IBILI NOT CALCULATED  --   --   --   --    < > = values in this interval not displayed.    Iron/TIBC/Ferritin/ %Sat    Component Value Date/Time   IRON 54 07/24/2021 1212   TIBC 277 07/24/2021 1212   FERRITIN 93 07/24/2021 1212   IRONPCTSAT 20 07/24/2021 1212       RADIOGRAPHIC STUDIES: I have personally reviewed the radiological images as listed and agreed with the findings in the report. NM  PET Image Initial (PI) Whole Body  Result Date: 08/28/2021 CLINICAL DATA:  Initial treatment strategy for multiple myeloma. EXAM: NUCLEAR MEDICINE PET WHOLE BODY TECHNIQUE: 7.88 mCi F-18 FDG was injected intravenously. Full-ring PET imaging was performed from the head to foot after the radiotracer. CT data was obtained and used for attenuation correction and anatomic localization. Fasting blood glucose: 79 mg/dl COMPARISON:  None. FINDINGS: Mediastinal blood pool activity: SUV max 2.49 HEAD/NECK: No hypermetabolic activity in the scalp. No hypermetabolic cervical lymph nodes. Incidental CT findings: none CHEST: No tracer avid supraclavicular axillary, mediastinal, or hilar lymph nodes. No suspicious pulmonary nodule. Incidental CT findings: Aortic atherosclerosis and coronary artery calcifications. Mild cardiac enlargement. ABDOMEN/PELVIS: No abnormal hypermetabolic activity within the liver, pancreas, adrenal glands, or spleen. No hypermetabolic lymph nodes in the abdomen or pelvis. Incidental CT findings: Aortic atherosclerosis. No aneurysm. Sigmoid diverticulosis. No signs of acute diverticulitis. SKELETON: Multifocal tracer avid lucent bone lesions are identified within the axial and proximal appendicular skeleton compatible with metabolically active lesions of multiple myeloma: -Index lesion within the L3 vertebral body has an SUV max 6.68. -Lesion with pathologic fracture involving the T11 vertebra has an SUV max of 4.92. -2 cm lucent lesion within the left iliac wing has an SUV max of 3.42, image 212/4. Incidental CT findings: none EXTREMITIES: -Tracer avid lesion within the proximal right femur at the level of the lesser trochanter has  an SUV max of 4.63, image 266/4. -mild focal tracer uptake is identified within the proximal diaphysis of the left humerus with SUV max of 2.3. Incidental CT findings: none IMPRESSION: 1. Examination is positive for multifocal tracer avid bone lesions compatible with  metabolically active lesions of multiple myeloma. 2. No signs of tracer avid adenopathy or mass. Electronically Signed   By: Kerby Moors M.D.   On: 08/28/2021 14:54   CT BONE MARROW BIOPSY & ASPIRATION  Result Date: 08/08/2021 INDICATION: Anemia and myeloma workup. EXAM: CT GUIDED BONE MARROW ASPIRATES AND BIOPSY Physician: Stephan Minister. Henn, MD MEDICATIONS: Moderate sedation ANESTHESIA/SEDATION: Moderate (conscious) sedation was employed during this procedure. A total of Versed 0.46m and fentanyl 25 mcg was administered intravenously at the order of the provider performing the procedure. Total intra-service moderate sedation time: 12 minutes. Patient's level of consciousness and vital signs were monitored continuously by radiology nurse throughout the procedure under the supervision of the provider performing the procedure. COMPLICATIONS: None immediate. PROCEDURE: The procedure was explained to the patient and patient's family. The risks and benefits of the procedure were discussed and the patient's questions were addressed. Informed consent was obtained from the patient. The patient was placed prone on CT table. Images of the pelvis were obtained. The right side of back was prepped and draped in sterile fashion. The skin and right posterior ilium were anesthetized with 1% lidocaine. 11 gauge bone needle was directed into the right ilium with CT guidance. Two aspirates and one core biopsy were obtained. Bandage placed over the puncture site. FINDINGS: CT imaging through the pelvis demonstrates a low-density and slightly expansile lesion at the left iliac wing that measures up to 2.0 cm. This is seen on sequence 2 image 10. Biopsy needle was directed in the posterior right ilium. IMPRESSION: 1. CT-guided bone marrow biopsy and aspiration. 2. Suspicious bone lesion in the left iliac wing. This lesion could be associated with myeloma. These results will be called to the ordering clinician or representative by the  Radiologist Assistant, and communication documented in the PACS or [employer] Electronically Signed   By: AMarkus DaftM.D.   On: 08/08/2021 13:19      ASSESSMENT & PLAN:  1. Multiple myeloma not having achieved remission ([place]   2. Symptomatic anemia   3. Stage 3a chronic kidney disease ([place]   4. Encounter for antineoplastic chemotherapy    Cancer Staging  Multiple myeloma ([place] Staging form: Plasma Cell Myeloma and Plasma Cell Disorders, AJCC 8th Edition - Clinical stage from 08/22/2021: RISS Stage II (Beta-2-microglobulin (mg/L): 4.1, Albumin (g/dL): 2.8, ISS: Stage II, High-risk cytogenetics: Absent, LDH: Normal) - Signed by YEarlie Server MD on 09/06/2021    #Multiple myeloma, IgA kappa Labs are reviewed and discussed with patient. Proceed with cycle 1 D8 Daratumumab.  Dexamethasone 243mx 1.  Patient will take her home supply of [facility]ulair weekly [facility]  Recommend her to start Revlimid 1046m 14 days.  Continue Asprin 36m92mily and Acyclovir 400mg38m.   # Symptomatic anemia, Hb7.8 Discussed with patient about rationale and side effects of blood transfusion. She agrees.  Proceed with 1 unit PRBC transfusion on 09/09/21  #Multiple bone lesions identified on PET scan.  No signs of adenopathy or mass.  Recommend patient to obtain dental clearance.  Plan bisphosphonate.  #Chronic kidney disease, recommend patient to avoid nephrotoxin.  Encourage oral hydration.  Supportive care measures are necessary for patient well-being and will be provided as necessary.  We spent sufficient time to discuss many aspect of care, questions were answered to patient's satisfaction.     All questions were answered. The patient knows to call the clinic with any problems questions or concerns.  cc Einar Pheasant, MD    Return of visit: 1week for lab md chemo  Earlie Server, MD, PhD [facility] Hematology Oncology 09/06/2021

## 2021-09-06 NOTE — Patient Instructions (Signed)
[facility] CANCER CTR AT [place]  Discharge Instructions: Thank you for choosing [place] to provide your oncology and hematology care.  If you have a lab appointment with the [place] [facility], please go directly to the [place] and check in at the registration area.  Wear comfortable clothing and clothing appropriate for easy access to any Portacath or PICC line.   We strive to give you quality time with your provider. You may need to reschedule your appointment if you arrive late (15 or more minutes).  Arriving late affects you and other patients whose appointments are after yours.  Also, if you miss three or more appointments without notifying the office, you may be dismissed from the clinic at the providers discretion.      For prescription refill requests, have your pharmacy contact our office and allow 72 hours for refills to be completed.    Today you received the following chemotherapy and/or immunotherapy agents Darzalex SQ    To help prevent nausea and vomiting after your treatment, we encourage you to take your nausea medication as directed.  BELOW ARE SYMPTOMS THAT SHOULD BE REPORTED IMMEDIATELY: *FEVER GREATER THAN 100.4 F (38 C) OR HIGHER *CHILLS OR SWEATING *NAUSEA AND VOMITING THAT IS NOT CONTROLLED WITH YOUR NAUSEA MEDICATION *UNUSUAL SHORTNESS OF BREATH *UNUSUAL BRUISING OR BLEEDING *URINARY PROBLEMS (pain or burning when urinating, or frequent urination) *BOWEL PROBLEMS (unusual diarrhea, constipation, pain near the anus) TENDERNESS IN MOUTH AND THROAT WITH OR WITHOUT PRESENCE OF ULCERS (sore throat, sores in mouth, or a toothache) UNUSUAL RASH, SWELLING OR PAIN  UNUSUAL VAGINAL DISCHARGE OR ITCHING   Items with * indicate a potential emergency and should be followed up as soon as possible or go to the Emergency Department if any problems should occur.  Please show the CHEMOTHERAPY ALERT CARD or IMMUNOTHERAPY ALERT CARD at check-in to  the Emergency Department and triage nurse.  Should you have questions after your visit or need to cancel or reschedule your appointment, please contact [facility] CANCER [place] AT [place]  [phone] and follow the prompts.  Office hours are 8:00 a.m. to 4:30 p.m. Monday - Friday. Please note that voicemails left after 4:00 p.m. may not be returned until the following business day.  We are closed weekends and major holidays. You have access to a nurse at all times for urgent questions. Please call the main number to the clinic [phone] and follow the prompts.  For any non-urgent questions, you may also contact your provider using MyChart. We now offer e-Visits for anyone 63 and older to request care online for non-urgent symptoms. For details visit [email or web address].   Also download the MyChart app! Go to the app store, search "MyChart", open the app, select [place], and log in with your MyChart username and password.  Due to Covid, a mask is required upon entering the hospital/clinic. If you do not have a mask, one will be given to you upon arrival. For doctor visits, patients may have 1 support person aged 36 or older with them. For treatment visits, patients cannot have anyone with them due to current Covid guidelines and our immunocompromised population.

## 2021-09-07 ENCOUNTER — Encounter: Payer: Self-pay | Admitting: Oncology

## 2021-09-08 ENCOUNTER — Encounter: Payer: Self-pay | Admitting: Internal Medicine

## 2021-09-08 LAB — PREPARE RBC (CROSSMATCH)

## 2021-09-08 NOTE — Assessment & Plan Note (Addendum)
Avoid antiinflammatories.  Stay hydrated.  Follow renal function. Continue losartan.

## 2021-09-08 NOTE — Assessment & Plan Note (Signed)
On tricor.  Check lipid panel.   

## 2021-09-08 NOTE — Assessment & Plan Note (Signed)
Blood pressure as outlined - outside readings averaging 130s/80s.  Continue toprol, amlodipine, microzide and losartan.  Continue current medication regimen.  Follow pressures.  Follow metabolic panel.

## 2021-09-08 NOTE — Assessment & Plan Note (Signed)
On aricept and namenda.  Daughter had requested referral to neurology - appt has been scheduled.

## 2021-09-08 NOTE — Assessment & Plan Note (Signed)
Cough and congestion as outlined.  omnicef as directed.  Saline nasal spray and steroid nasal spray as directed.  Robitussin DM as directed.

## 2021-09-08 NOTE — Assessment & Plan Note (Signed)
Followed by hematology.  Treatment planned - per hematology

## 2021-09-09 ENCOUNTER — Inpatient Hospital Stay: Payer: Medicare Other

## 2021-09-09 ENCOUNTER — Other Ambulatory Visit: Payer: Self-pay

## 2021-09-09 DIAGNOSIS — D649 Anemia, unspecified: Secondary | ICD-10-CM | POA: Diagnosis not present

## 2021-09-09 DIAGNOSIS — Z79899 Other long term (current) drug therapy: Secondary | ICD-10-CM | POA: Diagnosis not present

## 2021-09-09 DIAGNOSIS — R5383 Other fatigue: Secondary | ICD-10-CM | POA: Diagnosis not present

## 2021-09-09 DIAGNOSIS — I129 Hypertensive chronic kidney disease with stage 1 through stage 4 chronic kidney disease, or unspecified chronic kidney disease: Secondary | ICD-10-CM | POA: Diagnosis not present

## 2021-09-09 DIAGNOSIS — N1831 Chronic kidney disease, stage 3a: Secondary | ICD-10-CM | POA: Diagnosis not present

## 2021-09-09 DIAGNOSIS — K5730 Diverticulosis of large intestine without perforation or abscess without bleeding: Secondary | ICD-10-CM | POA: Diagnosis not present

## 2021-09-09 DIAGNOSIS — E8809 Other disorders of plasma-protein metabolism, not elsewhere classified: Secondary | ICD-10-CM | POA: Diagnosis not present

## 2021-09-09 DIAGNOSIS — I700 Atherosclerosis of aorta: Secondary | ICD-10-CM | POA: Diagnosis not present

## 2021-09-09 DIAGNOSIS — E7800 Pure hypercholesterolemia, unspecified: Secondary | ICD-10-CM | POA: Diagnosis not present

## 2021-09-09 DIAGNOSIS — Z803 Family history of malignant neoplasm of breast: Secondary | ICD-10-CM | POA: Diagnosis not present

## 2021-09-09 DIAGNOSIS — C9000 Multiple myeloma not having achieved remission: Secondary | ICD-10-CM | POA: Diagnosis not present

## 2021-09-09 DIAGNOSIS — E46 Unspecified protein-calorie malnutrition: Secondary | ICD-10-CM | POA: Diagnosis not present

## 2021-09-09 DIAGNOSIS — Z8249 Family history of ischemic heart disease and other diseases of the circulatory system: Secondary | ICD-10-CM | POA: Diagnosis not present

## 2021-09-09 DIAGNOSIS — Z5112 Encounter for antineoplastic immunotherapy: Secondary | ICD-10-CM | POA: Diagnosis not present

## 2021-09-09 DIAGNOSIS — Z7961 Long term (current) use of immunomodulator: Secondary | ICD-10-CM | POA: Diagnosis not present

## 2021-09-09 DIAGNOSIS — R0981 Nasal congestion: Secondary | ICD-10-CM | POA: Diagnosis not present

## 2021-09-09 MED ORDER — SODIUM CHLORIDE 0.9% IV SOLUTION
250.0000 mL | Freq: Once | INTRAVENOUS | Status: AC
  Administered 2021-09-09: 250 mL via INTRAVENOUS
  Filled 2021-09-09: qty 250

## 2021-09-09 MED ORDER — ACETAMINOPHEN 325 MG PO TABS
650.0000 mg | ORAL_TABLET | Freq: Once | ORAL | Status: AC
  Administered 2021-09-09: 650 mg via ORAL
  Filled 2021-09-09: qty 2

## 2021-09-09 MED ORDER — DIPHENHYDRAMINE HCL 25 MG PO CAPS
25.0000 mg | ORAL_CAPSULE | Freq: Once | ORAL | Status: AC
  Administered 2021-09-09: 25 mg via ORAL
  Filled 2021-09-09: qty 1

## 2021-09-09 NOTE — Patient Instructions (Signed)
[facility] CANCER CTR AT [place]  Discharge Instructions: Thank you for choosing [place] to provide your oncology and hematology care.  If you have a lab appointment with the [facility], please go directly to the [place] and check in at the registration area.  Wear comfortable clothing and clothing appropriate for easy access to any Portacath or PICC line.   We strive to give you quality time with your provider. You may need to reschedule your appointment if you arrive late (15 or more minutes).  Arriving late affects you and other patients whose appointments are after yours.  Also, if you miss three or more appointments without notifying the office, you may be dismissed from the clinic at the providers discretion.      For prescription refill requests, have your pharmacy contact our office and allow 72 hours for refills to be completed.    Today you received the following :  blood transfusion    Blood Transfusion, Adult, Care After This sheet gives you information about how to care for yourself after your procedure. Your doctor may also give you more specific instructions. If you have problems or questions, contact your doctor. What can I expect after the procedure? After the procedure, it is common to have: Bruising and soreness at the IV site. A headache. Follow these instructions at home: Insertion site care   Follow instructions from your doctor about how to take care of your insertion site. This is where an IV tube was put into your vein. Make sure you: Wash your hands with soap and water before and after you change your bandage (dressing). If you cannot use soap and water, use hand sanitizer. Change your bandage as told by your doctor. Check your insertion site every day for signs of infection. Check for: Redness, swelling, or pain. Bleeding from the site. Warmth. Pus or a bad smell. General instructions Take over-the-counter and  prescription medicines only as told by your doctor. Rest as told by your doctor. Go back to your normal activities as told by your doctor. Keep all follow-up visits as told by your doctor. This is important. Contact a doctor if: You have itching or red, swollen areas of skin (hives). You feel worried or nervous (anxious). You feel weak after doing your normal activities. You have redness, swelling, warmth, or pain around the insertion site. You have blood coming from the insertion site, and the blood does not stop with pressure. You have pus or a bad smell coming from the insertion site. Get help right away if: You have signs of a serious reaction. This may be coming from an allergy or the body's defense system (immune system). Signs include: Trouble breathing or shortness of breath. Swelling of the face or feeling warm (flushed). Fever or chills. Head, chest, or back pain. Dark pee (urine) or blood in the pee. Widespread rash. Fast heartbeat. Feeling dizzy or light-headed. You may receive your blood transfusion in an outpatient setting. If so, you will be told whom to contact to report any reactions. These symptoms may be an emergency. Do not wait to see if the symptoms will go away. Get medical help right away. Call your local emergency services (911 in the [place] Do not drive yourself to the hospital. Summary Bruising and soreness at the IV site are common. Check your insertion site every day for signs of infection. Rest as told by your doctor. Go back to your normal activities as told by your doctor.  Get help right away if you have signs of a serious reaction. This information is not intended to replace advice given to you by your health care provider. Make sure you discuss any questions you have with your health care provider. Document Revised: 11/08/2020 Document Reviewed: 01/06/2019 Elsevier Patient Education  [place].    To help prevent nausea and vomiting after  your treatment, we encourage you to take your nausea medication as directed.  BELOW ARE SYMPTOMS THAT SHOULD BE REPORTED IMMEDIATELY: *FEVER GREATER THAN 100.4 F (38 C) OR HIGHER *CHILLS OR SWEATING *NAUSEA AND VOMITING THAT IS NOT CONTROLLED WITH YOUR NAUSEA MEDICATION *UNUSUAL SHORTNESS OF BREATH *UNUSUAL BRUISING OR BLEEDING *URINARY PROBLEMS (pain or burning when urinating, or frequent urination) *BOWEL PROBLEMS (unusual diarrhea, constipation, pain near the anus) TENDERNESS IN MOUTH AND THROAT WITH OR WITHOUT PRESENCE OF ULCERS (sore throat, sores in mouth, or a toothache) UNUSUAL RASH, SWELLING OR PAIN  UNUSUAL VAGINAL DISCHARGE OR ITCHING   Items with * indicate a potential emergency and should be followed up as soon as possible or go to the Emergency Department if any problems should occur.  Please show the CHEMOTHERAPY ALERT CARD or IMMUNOTHERAPY ALERT CARD at check-in to the Emergency Department and triage nurse.  Should you have questions after your visit or need to cancel or reschedule your appointment, please contact [facility] CANCER [place] AT [place]  [phone] and follow the prompts.  Office hours are 8:00 a.m. to 4:30 p.m. Monday - Friday. Please note that voicemails left after 4:00 p.m. may not be returned until the following business day.  We are closed weekends and major holidays. You have access to a nurse at all times for urgent questions. Please call the main number to the clinic [phone] and follow the prompts.  For any non-urgent questions, you may also contact your provider using MyChart. We now offer e-Visits for anyone 28 and older to request care online for non-urgent symptoms. For details visit [email or web address].   Also download the MyChart app! Go to the app store, search "MyChart", open the app, select [place], and log in with your MyChart username and password.  Due to Covid, a mask is required upon entering the hospital/clinic. If  you do not have a mask, one will be given to you upon arrival. For doctor visits, patients may have 1 support person aged 72 or older with them. For treatment visits, patients cannot have anyone with them due to current Covid guidelines and our immunocompromised population.

## 2021-09-10 ENCOUNTER — Inpatient Hospital Stay: Payer: Medicare Other

## 2021-09-10 NOTE — Progress Notes (Signed)
Nutrition Assessment   Reason for Assessment:  Referral from Dr Tasia Catchings, poor appetite   ASSESSMENT:  88 year old female with multiple myeloma.  Past medical history of anemia, GERD, hypercholesterolemia, HTN, renal insufficiency, dementia.   Patient receiving daratumumab, revlimid.    Met with patient and daughter, Levada Dy in clinic.  Patient reports that she is eating. Daughter reports that she has noticed that she is eating less than before. Does not routinely have ice cream for bedtime snack as before.  Patient lives with husband and daughter. Husband prepares meals.  Denies nausea, diarrhea, constipation, trouble chewing  or swallowing or taste alterations.  Typically for breakfast has tea, cereal and banana. Mid-day will eat peanut butter crackers and drink water.  Dinner is meat and vegetables and starch.  Eats yogurt.  Has not tried oral nutrition supplements.      Medications: MVI, prilosec, vit C, aricept, namenda, compazine, KCL   Labs: creatinine 1.15   Anthropometrics:   Height: 62 inches Weight: 141 lb 8 oz today 156 lb on 08/08/21 BMI: 25  10% weight loss in the last month, significant  Estimated Energy Needs  Kcals: 1920-2200 Protein: 96-110 g Fluid: 1.9 L   NUTRITION DIAGNOSIS: Unintentional weight loss related to ?? Cancer related treatment side effects as evidenced by 10% weight loss in the last month and decreased intake    INTERVENTION:  Discussed importance of increasing calories and protein in diet to prevent further weight loss. Handout provided on High Calorie, High Protein diet and snacks.   Samples of ensure complete, ensure plus and [employer] 1.4 given to patient to try along with coupons.  If able to drink would add daily. Contact information provided   MONITORING, EVALUATION, GOAL: weight trends, intake   Next Visit: to be determined with treatment  Keilee Denman B. Zenia Resides, [place], [place] Registered Dietitian [phone] (mobile)

## 2021-09-11 LAB — BPAM RBC
Blood Product Expiration Date: 202303092359
ISSUE DATE / TIME: 202302130923
Unit Type and Rh: 5100

## 2021-09-11 LAB — TYPE AND SCREEN
ABO/RH(D): O POS
Antibody Screen: POSITIVE
Unit division: 0

## 2021-09-13 ENCOUNTER — Inpatient Hospital Stay (HOSPITAL_BASED_OUTPATIENT_CLINIC_OR_DEPARTMENT_OTHER): Payer: Medicare Other | Admitting: Oncology

## 2021-09-13 ENCOUNTER — Other Ambulatory Visit: Payer: Self-pay

## 2021-09-13 ENCOUNTER — Inpatient Hospital Stay: Payer: Medicare Other

## 2021-09-13 ENCOUNTER — Encounter: Payer: Self-pay | Admitting: Oncology

## 2021-09-13 VITALS — BP 146/77 | HR 65 | Temp 97.6°F | Resp 18 | Wt 143.0 lb

## 2021-09-13 DIAGNOSIS — C9000 Multiple myeloma not having achieved remission: Secondary | ICD-10-CM

## 2021-09-13 DIAGNOSIS — E7800 Pure hypercholesterolemia, unspecified: Secondary | ICD-10-CM | POA: Diagnosis not present

## 2021-09-13 DIAGNOSIS — K5730 Diverticulosis of large intestine without perforation or abscess without bleeding: Secondary | ICD-10-CM | POA: Diagnosis not present

## 2021-09-13 DIAGNOSIS — M899 Disorder of bone, unspecified: Secondary | ICD-10-CM

## 2021-09-13 DIAGNOSIS — E46 Unspecified protein-calorie malnutrition: Secondary | ICD-10-CM | POA: Diagnosis not present

## 2021-09-13 DIAGNOSIS — E8809 Other disorders of plasma-protein metabolism, not elsewhere classified: Secondary | ICD-10-CM | POA: Diagnosis not present

## 2021-09-13 DIAGNOSIS — I700 Atherosclerosis of aorta: Secondary | ICD-10-CM | POA: Diagnosis not present

## 2021-09-13 DIAGNOSIS — Z79899 Other long term (current) drug therapy: Secondary | ICD-10-CM | POA: Diagnosis not present

## 2021-09-13 DIAGNOSIS — Z7961 Long term (current) use of immunomodulator: Secondary | ICD-10-CM | POA: Diagnosis not present

## 2021-09-13 DIAGNOSIS — R0981 Nasal congestion: Secondary | ICD-10-CM | POA: Diagnosis not present

## 2021-09-13 DIAGNOSIS — Z803 Family history of malignant neoplasm of breast: Secondary | ICD-10-CM | POA: Diagnosis not present

## 2021-09-13 DIAGNOSIS — R5383 Other fatigue: Secondary | ICD-10-CM | POA: Diagnosis not present

## 2021-09-13 DIAGNOSIS — N1831 Chronic kidney disease, stage 3a: Secondary | ICD-10-CM

## 2021-09-13 DIAGNOSIS — D649 Anemia, unspecified: Secondary | ICD-10-CM | POA: Diagnosis not present

## 2021-09-13 DIAGNOSIS — Z5112 Encounter for antineoplastic immunotherapy: Secondary | ICD-10-CM | POA: Diagnosis not present

## 2021-09-13 DIAGNOSIS — Z8249 Family history of ischemic heart disease and other diseases of the circulatory system: Secondary | ICD-10-CM | POA: Diagnosis not present

## 2021-09-13 DIAGNOSIS — Z5111 Encounter for antineoplastic chemotherapy: Secondary | ICD-10-CM

## 2021-09-13 DIAGNOSIS — I129 Hypertensive chronic kidney disease with stage 1 through stage 4 chronic kidney disease, or unspecified chronic kidney disease: Secondary | ICD-10-CM | POA: Diagnosis not present

## 2021-09-13 LAB — CBC WITH DIFFERENTIAL/PLATELET
Abs Immature Granulocytes: 0 10*3/uL (ref 0.00–0.07)
Basophils Absolute: 0 10*3/uL (ref 0.0–0.1)
Basophils Relative: 1 %
Eosinophils Absolute: 0 10*3/uL (ref 0.0–0.5)
Eosinophils Relative: 1 %
HCT: 29.4 % — ABNORMAL LOW (ref 36.0–46.0)
Hemoglobin: 9.4 g/dL — ABNORMAL LOW (ref 12.0–15.0)
Immature Granulocytes: 0 %
Lymphocytes Relative: 22 %
Lymphs Abs: 0.6 10*3/uL — ABNORMAL LOW (ref 0.7–4.0)
MCH: 31.2 pg (ref 26.0–34.0)
MCHC: 32 g/dL (ref 30.0–36.0)
MCV: 97.7 fL (ref 80.0–100.0)
Monocytes Absolute: 0.4 10*3/uL (ref 0.1–1.0)
Monocytes Relative: 14 %
Neutro Abs: 1.6 10*3/uL — ABNORMAL LOW (ref 1.7–7.7)
Neutrophils Relative %: 62 %
Platelets: 163 10*3/uL (ref 150–400)
RBC: 3.01 MIL/uL — ABNORMAL LOW (ref 3.87–5.11)
RDW: 17.2 % — ABNORMAL HIGH (ref 11.5–15.5)
WBC: 2.6 10*3/uL — ABNORMAL LOW (ref 4.0–10.5)
nRBC: 0 % (ref 0.0–0.2)

## 2021-09-13 LAB — COMPREHENSIVE METABOLIC PANEL
ALT: 15 U/L (ref 0–44)
AST: 17 U/L (ref 15–41)
Albumin: 2.7 g/dL — ABNORMAL LOW (ref 3.5–5.0)
Alkaline Phosphatase: 75 U/L (ref 38–126)
Anion gap: 12 (ref 5–15)
BUN: 20 mg/dL (ref 8–23)
CO2: 25 mmol/L (ref 22–32)
Calcium: 9.4 mg/dL (ref 8.9–10.3)
Chloride: 101 mmol/L (ref 98–111)
Creatinine, Ser: 0.9 mg/dL (ref 0.44–1.00)
GFR, Estimated: >60 mL/min (ref >60)
Glucose, Bld: 101 mg/dL — ABNORMAL HIGH (ref 70–99)
Potassium: 3.6 mmol/L (ref 3.5–5.1)
Sodium: 138 mmol/L (ref 135–145)
Total Bilirubin: <0.1 mg/dL — ABNORMAL LOW (ref 0.3–1.2)
Total Protein: 9.1 g/dL — ABNORMAL HIGH (ref 6.5–8.1)

## 2021-09-13 MED ORDER — ACETAMINOPHEN 325 MG PO TABS
650.0000 mg | ORAL_TABLET | Freq: Once | ORAL | Status: AC
  Administered 2021-09-13: 650 mg via ORAL
  Filled 2021-09-13: qty 2

## 2021-09-13 MED ORDER — DARATUMUMAB-HYALURONIDASE-FIHJ 1800-30000 MG-UT/15ML ~~LOC~~ SOLN
1800.0000 mg | Freq: Once | SUBCUTANEOUS | Status: AC
  Administered 2021-09-13: 1800 mg via SUBCUTANEOUS
  Filled 2021-09-13: qty 15

## 2021-09-13 MED ORDER — DIPHENHYDRAMINE HCL 25 MG PO CAPS
50.0000 mg | ORAL_CAPSULE | Freq: Once | ORAL | Status: AC
  Administered 2021-09-13: 50 mg via ORAL
  Filled 2021-09-13: qty 2

## 2021-09-13 MED ORDER — DEXAMETHASONE 4 MG PO TABS
20.0000 mg | ORAL_TABLET | Freq: Once | ORAL | Status: AC
  Administered 2021-09-13: 20 mg via ORAL
  Filled 2021-09-13: qty 5

## 2021-09-13 NOTE — Progress Notes (Signed)
Patient here for follow up. Pt reports that she has back pain 3/10, but this has been chronic.

## 2021-09-13 NOTE — Progress Notes (Signed)
Hematology/Oncology Progress note [phone] [phone]         Patient Care Team: Einar Pheasant, MD as PCP - General (Internal Medicine)  REFERRING PROVIDER: Einar Pheasant, MD  CHIEF COMPLAINTS/REASON FOR VISIT:  Follow-up for multiple myeloma  HISTORY OF PRESENTING ILLNESS:   Stephanie Delacruz is a  88 y.o.  female with PMH listed below was seen in consultation at the request of  Einar Pheasant, MD  for evaluation of anemia 07/15/2021, patient had CBC checked at the primary care provider's office.  CBC showed hemoglobin 7.7, hematocrit 23.9, white count 3.6, RDW 16.5.  Platelet count 166. Reviewed previous lab records.  Patient has chronic anemia dating back at least 2016. Her baseline prior to 2018 was about 10.   Hemoglobin was 9.1 in March 2022, and further dropped to 8.1 in October 2022, then November dropped to 7.7. Patient denies being significantly affected, tired, lightheadedness, shortness of breath.  Appetite is fair.  Denies any black or bloody stool.   Denies any history of hemoglobinopathy  08/26/2021 bone marrow biopsy showed hypercellular marrow with plasma cell neoplasm.  67% plasma cells in the aspirate.  Cytogenetics showed complex hyperplasia, gain of 1 q.  INTERVAL HISTORY Stephanie Delacruz is a 88 y.o. female who has above history reviewed by me today presents for follow up visit for management of multiple myeloma 09/06/2021 started on Revlimid 10 mg, plan 14 days on and 7 days off.  Overall she tolerates well. Status post 1 unit of PRBC transfusion. Patient reports feeling no change of her energy level Chronic fatigue unchanged Appetite is fair and weight is stable.  Denies nausea vomiting diarrhea.  She has mild back pain, which is chronic, slightly more prominent.  Patient attributes to weather changes.  She rated as 3 out of 10.  Review of Systems  Constitutional:  Positive for fatigue. Negative for appetite change, chills and fever.   HENT:   Negative for hearing loss and voice change.   Eyes:  Negative for eye problems.  Respiratory:  Negative for chest tightness and cough.   Cardiovascular:  Negative for chest pain.  Gastrointestinal:  Negative for abdominal distention, abdominal pain and blood in stool.  Endocrine: Negative for hot flashes.  Genitourinary:  Negative for difficulty urinating and frequency.   Musculoskeletal:  Positive for back pain. Negative for arthralgias.  Skin:  Negative for itching and rash.  Neurological:  Negative for extremity weakness.  Hematological:  Negative for adenopathy.  Psychiatric/Behavioral:  Negative for confusion.    MEDICAL HISTORY:  Past Medical History:  Diagnosis Date   Anemia    GERD (gastroesophageal reflux disease)    Hypercholesterolemia    Hypertension    Renal insufficiency     SURGICAL HISTORY: Past Surgical History:  Procedure Laterality Date   TONSILLECTOMY     TUBAL LIGATION      SOCIAL HISTORY: Social History   Socioeconomic History   Marital status: Married    Spouse name: Not on file   Number of children: Not on file   Years of education: Not on file   Highest education level: Not on file  Occupational History   Not on file  Tobacco Use   Smoking status: Never   Smokeless tobacco: Never  Substance and Sexual Activity   Alcohol use: No   Drug use: No   Sexual activity: Not on file  Other Topics Concern   Not on file  Social History Narrative   Not on  file   Social Determinants of Health   Financial Resource Strain: Not on file  Food Insecurity: Not on file  Transportation Needs: Not on file  Physical Activity: Not on file  Stress: Not on file  Social Connections: Not on file  Intimate Partner Violence: Not on file    FAMILY HISTORY: Family History  Problem Relation Age of Onset   Hypertension Mother    Arthritis Mother    Hypertension Father    Stroke Father    Breast cancer Niece     ALLERGIES:  has No Known  Allergies.  MEDICATIONS:  Current Outpatient Medications  Medication Sig Dispense Refill   acyclovir (ZOVIRAX) 400 MG tablet Take 1 tablet (400 mg total) by mouth 2 (two) times daily. 60 tablet 11   amLODipine (NORVASC) 2.5 MG tablet Take 10 mg by mouth daily.     ascorbic acid (VITAMIN C) 500 MG tablet Take 500 mg by mouth daily.     aspirin 81 MG EC tablet Take 81 mg by mouth daily.     donepezil (ARICEPT) 10 MG tablet Take 10 mg by mouth daily.     fenofibrate (TRICOR) 48 MG tablet Take 48 mg by mouth daily.     fexofenadine (ALLEGRA) 180 MG tablet Take by mouth.     hydrochlorothiazide (MICROZIDE) 12.5 MG capsule Take 12.5 mg by mouth daily.     lenalidomide (REVLIMID) 10 MG capsule Take 1 capsule (10 mg total) by mouth daily. Take for 21 days, then hold for 7 days. Repeat every 28 days. 21 capsule 0   losartan (COZAAR) 100 MG tablet Take 100 mg by mouth daily.     memantine (NAMENDA) 5 MG tablet Take 10 mg by mouth 2 (two) times daily.     metoprolol succinate (TOPROL-XL) 25 MG 24 hr tablet Take 50 mg by mouth daily.     montelukast (SINGULAIR) 10 MG tablet Take 1 tablet (10 mg total) by mouth daily. 30 tablet 0   Multiple Vitamins-Minerals (WOMENS MULTIVITAMIN PO) Take 1 tablet by mouth daily.     omeprazole (PRILOSEC) 20 MG capsule Take 20 mg by mouth daily.     potassium chloride (KLOR-CON M) 10 MEQ tablet TAKE 1 TABLET(10 MEQ) BY MOUTH DAILY 30 tablet 2   prochlorperazine (COMPAZINE) 10 MG tablet Take 1 tablet (10 mg total) by mouth every 6 (six) hours as needed (Nausea or vomiting). 30 tablet 1   No current facility-administered medications for this visit.     PHYSICAL EXAMINATION: ECOG PERFORMANCE STATUS: 1 - Symptomatic but completely ambulatory Vitals:   09/13/21 0838  BP: (!) 146/77  Pulse: 65  Resp: 18  Temp: 97.6 F (36.4 C)   Filed Weights   09/13/21 0838  Weight: 143 lb (64.9 kg)    Physical Exam Constitutional:      General: She is not in acute  distress. HENT:     Head: Normocephalic and atraumatic.  Eyes:     General: No scleral icterus. Cardiovascular:     Rate and Rhythm: Normal rate and regular rhythm.     Heart sounds: Normal heart sounds.  Pulmonary:     Effort: Pulmonary effort is normal. No respiratory distress.     Breath sounds: No wheezing.  Abdominal:     General: Bowel sounds are normal. There is no distension.     Palpations: Abdomen is soft.  Musculoskeletal:        General: No deformity. Normal range of motion.     Cervical  back: Normal range of motion and neck supple.  Skin:    General: Skin is warm and dry.     Findings: No erythema or rash.  Neurological:     Mental Status: She is alert and oriented to person, place, and time. Mental status is at baseline.     Cranial Nerves: No cranial nerve deficit.     Coordination: Coordination normal.  Psychiatric:        Mood and Affect: Mood normal.    LABORATORY DATA:  I have reviewed the data as listed Lab Results  Component Value Date   WBC 2.6 (L) 09/13/2021   HGB 9.4 (L) 09/13/2021   HCT 29.4 (L) 09/13/2021   MCV 97.7 09/13/2021   PLT 163 09/13/2021   Recent Labs    06/27/21 0940 07/15/21 1117 08/30/21 0901 09/06/21 0814 09/13/21 0804  NA 142   < > 139 139 138  K 3.8   < > 3.3* 4.0 3.6  CL 106   < > 101 104 101  CO2 25   < > _0 GLUCOSE 90   < > 113* 90 101*  BUN 19   < > _1 CREATININE 1.20*   < > 1.19* 1.15* 0.90  CALCIUM 10.1   < > 9.8 9.7 9.4  GFRNONAA 44*   < > 45* 47* >60  PROT 9.8*   < > 10.1* 9.5* 9.1*  ALBUMIN 2.8*   < > 2.8* 2.6* 2.7*  AST 20   < > _2 ALT 13   < > _3 ALKPHOS 39   < > 39 62 75  BILITOT 0.5   < > 0.3 0.3 <0.1*  BILIDIR <0.1  --   --   --   --   IBILI NOT CALCULATED  --   --   --   --    < > = values in this interval not displayed.    Iron/TIBC/Ferritin/ %Sat    Component Value Date/Time   IRON 54 07/24/2021 1212   TIBC 277 07/24/2021 1212   FERRITIN 93 07/24/2021 1212    IRONPCTSAT 20 07/24/2021 1212       RADIOGRAPHIC STUDIES: I have personally reviewed the radiological images as listed and agreed with the findings in the report. NM PET Image Initial (PI) Whole Body  Result Date: 08/28/2021 CLINICAL DATA:  Initial treatment strategy for multiple myeloma. EXAM: NUCLEAR MEDICINE PET WHOLE BODY TECHNIQUE: 7.88 mCi F-18 FDG was injected intravenously. Full-ring PET imaging was performed from the head to foot after the radiotracer. CT data was obtained and used for attenuation correction and anatomic localization. Fasting blood glucose: 79 mg/dl COMPARISON:  None. FINDINGS: Mediastinal blood pool activity: SUV max 2.49 HEAD/NECK: No hypermetabolic activity in the scalp. No hypermetabolic cervical lymph nodes. Incidental CT findings: none CHEST: No tracer avid supraclavicular axillary, mediastinal, or hilar lymph nodes. No suspicious pulmonary nodule. Incidental CT findings: Aortic atherosclerosis and coronary artery calcifications. Mild cardiac enlargement. ABDOMEN/PELVIS: No abnormal hypermetabolic activity within the liver, pancreas, adrenal glands, or spleen. No hypermetabolic lymph nodes in the abdomen or pelvis. Incidental CT findings: Aortic atherosclerosis. No aneurysm. Sigmoid diverticulosis. No signs of acute diverticulitis. SKELETON: Multifocal tracer avid lucent bone lesions are identified within the axial and proximal appendicular skeleton compatible with metabolically active lesions of multiple myeloma: -Index lesion within the L3 vertebral body has an SUV max 6.68. -Lesion with pathologic fracture involving the T11 vertebra has an SUV  max of 4.92. -2 cm lucent lesion within the left iliac wing has an SUV max of 3.42, image 212/4. Incidental CT findings: none EXTREMITIES: -Tracer avid lesion within the proximal right femur at the level of the lesser trochanter has an SUV max of 4.63, image 266/4. -mild focal tracer uptake is identified within the proximal diaphysis  of the left humerus with SUV max of 2.3. Incidental CT findings: none IMPRESSION: 1. Examination is positive for multifocal tracer avid bone lesions compatible with metabolically active lesions of multiple myeloma. 2. No signs of tracer avid adenopathy or mass. Electronically Signed   By: Kerby Moors M.D.   On: 08/28/2021 14:54      ASSESSMENT & PLAN:  1. Multiple myeloma not having achieved remission ([place])   2. Encounter for antineoplastic chemotherapy   3. Normocytic anemia   4. Stage 3a chronic kidney disease ([place])   5. Bone lesion   6. Hypoalbuminemia due to protein-calorie malnutrition ([place])    Cancer Staging  Multiple myeloma ([place]) Staging form: Plasma Cell Myeloma and Plasma Cell Disorders, AJCC 8th Edition - Clinical stage from 08/22/2021: RISS Stage II (Beta-2-microglobulin (mg/L): 4.1, Albumin (g/dL): 2.8, ISS: Stage II, High-risk cytogenetics: Absent, LDH: Normal) - Signed by Earlie Server, MD on 09/06/2021    #Multiple myeloma, IgA kappa Labs are reviewed and discussed with patient. Proceed with cycle 1 D15 Daratumumab.  Dexamethasone 22m x 1.  Continue Revlimid 10 mg daily for 7 more days. Continue Asprin 821mdaily and Acyclovir 40066mID.   #Normocytic anemia, hemoglobin has improved to 9.4.  #Multiple bone lesions identified on PET scan.  No signs of adenopathy or mass.   Discussed about rationale and potential side effects of bisphosphonate.  Dental clearance has been obtained. Recommend calcium supplementation.  Plan to start Zometa at next visit. #Chronic kidney disease, recommend patient to avoid nephrotoxin.  Encourage oral hydration. #Hypoalbuminemia secondary to malnutrition.  Follow-up with nutritionist. Supportive care measures are necessary for patient well-being and will be provided as necessary. We spent sufficient time to discuss many aspect of care, questions were answered to patient's satisfaction.     All questions were answered. The patient knows to  call the clinic with any problems questions or concerns.  cc ScoEinar PheasantD    Return of visit: 1week for lab and Daratumumab/dexamethasone. 2 weeks for lab MD Daratumumab/dexamethasone/Revlimid  ZhoEarlie ServerD, PhD [facility] 09/13/2021

## 2021-09-13 NOTE — Patient Instructions (Signed)
[facility]  Discharge Instructions: °Thank you for choosing [facility] to provide your oncology and hematology care.  °If you have a lab appointment with the Cancer Center, please go directly to the Cancer Center and check in at the registration area. ° °Wear comfortable clothing and clothing appropriate for easy access to any Portacath or PICC line.  ° °We strive to give you quality time with your provider. You may need to reschedule your appointment if you arrive late (15 or more minutes).  Arriving late affects you and other patients whose appointments are after yours.  Also, if you miss three or more appointments without notifying the office, you may be dismissed from the clinic at the provider’s discretion.    °  °For prescription refill requests, have your pharmacy contact our office and allow 72 hours for refills to be completed.   ° °Today you received the following chemotherapy and/or immunotherapy agents: Darzalex-Faspro    °  °To help prevent nausea and vomiting after your treatment, we encourage you to take your nausea medication as directed. ° °BELOW ARE SYMPTOMS THAT SHOULD BE REPORTED IMMEDIATELY: °*FEVER GREATER THAN 100.4 F (38 °C) OR HIGHER °*CHILLS OR SWEATING °*NAUSEA AND VOMITING THAT IS NOT CONTROLLED WITH YOUR NAUSEA MEDICATION °*UNUSUAL SHORTNESS OF BREATH °*UNUSUAL BRUISING OR BLEEDING °*URINARY PROBLEMS (pain or burning when urinating, or frequent urination) °*BOWEL PROBLEMS (unusual diarrhea, constipation, pain near the anus) °TENDERNESS IN MOUTH AND THROAT WITH OR WITHOUT PRESENCE OF ULCERS (sore throat, sores in mouth, or a toothache) °UNUSUAL RASH, SWELLING OR PAIN  °UNUSUAL VAGINAL DISCHARGE OR ITCHING  ° °Items with * indicate a potential emergency and should be followed up as soon as possible or go to the Emergency Department if any problems should occur. ° °Please show the CHEMOTHERAPY ALERT CARD or IMMUNOTHERAPY ALERT CARD at  check-in to the Emergency Department and triage nurse. ° °Should you have questions after your visit or need to cancel or reschedule your appointment, please contact [facility]  [phone] and follow the prompts.  Office hours are 8:00 a.m. to 4:30 p.m. Monday - Friday. Please note that voicemails left after 4:00 p.m. may not be returned until the following business day.  We are closed weekends and major holidays. You have access to a nurse at all times for urgent questions. Please call the main number to the clinic [phone] and follow the prompts. ° °For any non-urgent questions, you may also contact your provider using MyChart. We now offer e-Visits for anyone 18 and older to request care online for non-urgent symptoms. For details visit [email or web address]. °  °Also download the MyChart app! Go to the app store, search "MyChart", open the app, select [facility], and log in with your MyChart username and password. ° °Due to Covid, a mask is required upon entering the hospital/clinic. If you do not have a mask, one will be given to you upon arrival. For doctor visits, patients may have 1 support person aged 18 or older with them. For treatment visits, patients cannot have anyone with them due to current Covid guidelines and our immunocompromised population. Daratumumab; Hyaluronidase Injection °What is this medication? °DARATUMUMAB; HYALURONIDASE (dar a toom ue mab / hye al ur ON i dase) is a monoclonal antibody. Hyaluronidase is used to improve the effects of daratumumab. It treats certain types of cancer. Some of the cancers treated are multiple myeloma and light-chain amyloidosis. °This medicine may be used for   other purposes; ask your health care provider or pharmacist if you have questions. °COMMON BRAND NAME(S): DARZALEX FASPRO °What should I tell my care team before I take this medication? °They need to know if you have any of these conditions: °heart  disease °infection especially a viral infection such as chickenpox, cold sores, herpes, or hepatitis B °lung or breathing disease °an unusual or allergic reaction to daratumumab, hyaluronidase, other medicines, foods, dyes, or preservatives °pregnant or trying to get pregnant °breast-feeding °How should I use this medication? °This medicine is for injection under the skin. It is given by a health care professional in a hospital or clinic setting. °Talk to your pediatrician regarding the use of this medicine in children. Special care may be needed. °Overdosage: If you think you have taken too much of this medicine contact a poison control center or emergency room at once. °NOTE: This medicine is only for you. Do not share this medicine with others. °What if I miss a dose? °Keep appointments for follow-up doses as directed. It is important not to miss your dose. Call your doctor or health care professional if you are unable to keep an appointment. °What may interact with this medication? °Interactions have not been studied. °This list may not describe all possible interactions. Give your health care provider a list of all the medicines, herbs, non-prescription drugs, or dietary supplements you use. Also tell them if you smoke, drink alcohol, or use illegal drugs. Some items may interact with your medicine. °What should I watch for while using this medication? °Your condition will be monitored carefully while you are receiving this medicine. °This medicine can cause serious allergic reactions. To reduce your risk, your health care provider may give you other medicine to take before receiving this one. Be sure to follow the directions from your health care provider. °This medicine can affect the results of blood tests to match your blood type. These changes can last for up to 6 months after the final dose. Your healthcare provider will do blood tests to match your blood type before you start treatment. Tell all of your  healthcare providers that you are being treated with this medicine before receiving a blood transfusion. °This medicine can affect the results of some tests used to determine treatment response; extra tests may be needed to evaluate response. °Do not become pregnant while taking this medicine or for 3 months after stopping it. Women should inform their health care provider if they wish to become pregnant or think they might be pregnant. There is a potential for serious side effects to an unborn child. Talk to your health care provider for more information. Do not breast-feed an infant while taking this medicine. °What side effects may I notice from receiving this medication? °Side effects that you should report to your care team as soon as possible: °Allergic reactions--skin rash, itching or hives, swelling of the face, lips, or tongue °Blood clot--chest pain, shortness of breath, pain, swelling or warmth in the leg °Blurred vision °Fast, irregular heartbeat °Infection--fever, chills, cough, sore throat, pain or trouble passing urine °Injection reactions--dizziness, fast heartbeat, feeling faint or lightheaded, falls, headache, increase in blood pressure, nausea, vomiting, or wheezing or trouble breathing with loud or whistling sounds °Low red blood cell counts--trouble breathing, feeling faint, lightheaded or falls, unusually weak or tired °Unusual bleeding or bruising °Side effects that usually do not require medical attention (report these to your care team if they continue or are bothersome): °Back pain °Constipation °Diarrhea °Pain,   tingling, numbness in the hands or feet °Pain, redness, or irritation at site where injected °Muscle cramp or pain °Swelling of the ankles, feet, hands °Tiredness °Trouble sleeping °This list may not describe all possible side effects. Call your doctor for medical advice about side effects. You may report side effects to FDA at 1-800-FDA-1088. °Where should I keep my  medication? °This drug is given in a hospital or clinic and will not be stored at home. °NOTE: This sheet is a summary. It may not cover all possible information. If you have questions about this medicine, talk to your doctor, pharmacist, or health care provider. °© 2022 [employer]/Gold Standard (2021-04-02 00:00:00) ° °

## 2021-09-16 DIAGNOSIS — R011 Cardiac murmur, unspecified: Secondary | ICD-10-CM | POA: Insufficient documentation

## 2021-09-16 DIAGNOSIS — G44209 Tension-type headache, unspecified, not intractable: Secondary | ICD-10-CM | POA: Insufficient documentation

## 2021-09-16 DIAGNOSIS — N1831 Chronic kidney disease, stage 3a: Secondary | ICD-10-CM | POA: Diagnosis not present

## 2021-09-16 DIAGNOSIS — C9000 Multiple myeloma not having achieved remission: Secondary | ICD-10-CM | POA: Diagnosis not present

## 2021-09-16 DIAGNOSIS — J309 Allergic rhinitis, unspecified: Secondary | ICD-10-CM | POA: Insufficient documentation

## 2021-09-16 DIAGNOSIS — E559 Vitamin D deficiency, unspecified: Secondary | ICD-10-CM | POA: Diagnosis not present

## 2021-09-16 DIAGNOSIS — G3184 Mild cognitive impairment, so stated: Secondary | ICD-10-CM | POA: Diagnosis not present

## 2021-09-16 DIAGNOSIS — H269 Unspecified cataract: Secondary | ICD-10-CM | POA: Insufficient documentation

## 2021-09-17 LAB — TYPE AND SCREEN
ABO/RH(D): O POS
Antibody Screen: POSITIVE

## 2021-09-19 DIAGNOSIS — I6782 Cerebral ischemia: Secondary | ICD-10-CM | POA: Diagnosis not present

## 2021-09-19 DIAGNOSIS — G319 Degenerative disease of nervous system, unspecified: Secondary | ICD-10-CM | POA: Diagnosis not present

## 2021-09-20 ENCOUNTER — Inpatient Hospital Stay: Payer: Medicare Other

## 2021-09-20 ENCOUNTER — Other Ambulatory Visit: Payer: Self-pay | Admitting: Oncology

## 2021-09-20 ENCOUNTER — Other Ambulatory Visit: Payer: Self-pay | Admitting: *Deleted

## 2021-09-20 ENCOUNTER — Other Ambulatory Visit: Payer: Self-pay

## 2021-09-20 VITALS — BP 128/55 | HR 62 | Temp 98.4°F | Resp 16

## 2021-09-20 DIAGNOSIS — Z7961 Long term (current) use of immunomodulator: Secondary | ICD-10-CM | POA: Diagnosis not present

## 2021-09-20 DIAGNOSIS — I129 Hypertensive chronic kidney disease with stage 1 through stage 4 chronic kidney disease, or unspecified chronic kidney disease: Secondary | ICD-10-CM | POA: Diagnosis not present

## 2021-09-20 DIAGNOSIS — Z79899 Other long term (current) drug therapy: Secondary | ICD-10-CM | POA: Diagnosis not present

## 2021-09-20 DIAGNOSIS — C9000 Multiple myeloma not having achieved remission: Secondary | ICD-10-CM

## 2021-09-20 DIAGNOSIS — I700 Atherosclerosis of aorta: Secondary | ICD-10-CM | POA: Diagnosis not present

## 2021-09-20 DIAGNOSIS — E46 Unspecified protein-calorie malnutrition: Secondary | ICD-10-CM | POA: Diagnosis not present

## 2021-09-20 DIAGNOSIS — R0981 Nasal congestion: Secondary | ICD-10-CM | POA: Diagnosis not present

## 2021-09-20 DIAGNOSIS — Z8249 Family history of ischemic heart disease and other diseases of the circulatory system: Secondary | ICD-10-CM | POA: Diagnosis not present

## 2021-09-20 DIAGNOSIS — Z5112 Encounter for antineoplastic immunotherapy: Secondary | ICD-10-CM | POA: Diagnosis not present

## 2021-09-20 DIAGNOSIS — E7800 Pure hypercholesterolemia, unspecified: Secondary | ICD-10-CM | POA: Diagnosis not present

## 2021-09-20 DIAGNOSIS — N1831 Chronic kidney disease, stage 3a: Secondary | ICD-10-CM | POA: Diagnosis not present

## 2021-09-20 DIAGNOSIS — E8809 Other disorders of plasma-protein metabolism, not elsewhere classified: Secondary | ICD-10-CM | POA: Diagnosis not present

## 2021-09-20 DIAGNOSIS — R5383 Other fatigue: Secondary | ICD-10-CM | POA: Diagnosis not present

## 2021-09-20 DIAGNOSIS — D649 Anemia, unspecified: Secondary | ICD-10-CM | POA: Diagnosis not present

## 2021-09-20 DIAGNOSIS — K5730 Diverticulosis of large intestine without perforation or abscess without bleeding: Secondary | ICD-10-CM | POA: Diagnosis not present

## 2021-09-20 DIAGNOSIS — Z803 Family history of malignant neoplasm of breast: Secondary | ICD-10-CM | POA: Diagnosis not present

## 2021-09-20 LAB — COMPREHENSIVE METABOLIC PANEL
ALT: 16 U/L (ref 0–44)
AST: 20 U/L (ref 15–41)
Albumin: 2.9 g/dL — ABNORMAL LOW (ref 3.5–5.0)
Alkaline Phosphatase: 76 U/L (ref 38–126)
Anion gap: 12 (ref 5–15)
BUN: 21 mg/dL (ref 8–23)
CO2: 25 mmol/L (ref 22–32)
Calcium: 9.6 mg/dL (ref 8.9–10.3)
Chloride: 105 mmol/L (ref 98–111)
Creatinine, Ser: 1.01 mg/dL — ABNORMAL HIGH (ref 0.44–1.00)
GFR, Estimated: 55 mL/min — ABNORMAL LOW (ref >60)
Glucose, Bld: 89 mg/dL (ref 70–99)
Potassium: 3.7 mmol/L (ref 3.5–5.1)
Sodium: 142 mmol/L (ref 135–145)
Total Bilirubin: 0.2 mg/dL — ABNORMAL LOW (ref 0.3–1.2)
Total Protein: 9.2 g/dL — ABNORMAL HIGH (ref 6.5–8.1)

## 2021-09-20 LAB — CBC WITH DIFFERENTIAL/PLATELET
Abs Immature Granulocytes: 0.01 10*3/uL (ref 0.00–0.07)
Basophils Absolute: 0 10*3/uL (ref 0.0–0.1)
Basophils Relative: 1 %
Eosinophils Absolute: 0 10*3/uL (ref 0.0–0.5)
Eosinophils Relative: 1 %
HCT: 29.4 % — ABNORMAL LOW (ref 36.0–46.0)
Hemoglobin: 9.2 g/dL — ABNORMAL LOW (ref 12.0–15.0)
Immature Granulocytes: 0 %
Lymphocytes Relative: 21 %
Lymphs Abs: 0.6 10*3/uL — ABNORMAL LOW (ref 0.7–4.0)
MCH: 31 pg (ref 26.0–34.0)
MCHC: 31.3 g/dL (ref 30.0–36.0)
MCV: 99 fL (ref 80.0–100.0)
Monocytes Absolute: 0.5 10*3/uL (ref 0.1–1.0)
Monocytes Relative: 17 %
Neutro Abs: 1.8 10*3/uL (ref 1.7–7.7)
Neutrophils Relative %: 60 %
Platelets: 187 10*3/uL (ref 150–400)
RBC: 2.97 MIL/uL — ABNORMAL LOW (ref 3.87–5.11)
RDW: 17.1 % — ABNORMAL HIGH (ref 11.5–15.5)
WBC: 3 10*3/uL — ABNORMAL LOW (ref 4.0–10.5)
nRBC: 0 % (ref 0.0–0.2)

## 2021-09-20 MED ORDER — LENALIDOMIDE 10 MG PO CAPS: 10.0000 mg | ORAL_CAPSULE | Freq: Every day | ORAL | 0 refills | Status: DC

## 2021-09-20 MED ORDER — DEXAMETHASONE 4 MG PO TABS
20.0000 mg | ORAL_TABLET | Freq: Once | ORAL | Status: AC
  Administered 2021-09-20: 20 mg via ORAL
  Filled 2021-09-20: qty 5

## 2021-09-20 MED ORDER — ACETAMINOPHEN 325 MG PO TABS
650.0000 mg | ORAL_TABLET | Freq: Once | ORAL | Status: AC
  Administered 2021-09-20: 650 mg via ORAL
  Filled 2021-09-20: qty 2

## 2021-09-20 MED ORDER — DARATUMUMAB-HYALURONIDASE-FIHJ 1800-30000 MG-UT/15ML ~~LOC~~ SOLN
1800.0000 mg | Freq: Once | SUBCUTANEOUS | Status: AC
  Administered 2021-09-20: 1800 mg via SUBCUTANEOUS
  Filled 2021-09-20: qty 15

## 2021-09-20 MED ORDER — DIPHENHYDRAMINE HCL 25 MG PO CAPS
50.0000 mg | ORAL_CAPSULE | Freq: Once | ORAL | Status: AC
  Administered 2021-09-20: 50 mg via ORAL
  Filled 2021-09-20: qty 2

## 2021-09-20 NOTE — Progress Notes (Signed)
Per MD, Dr. Tasia Catchings, order: monitor patient 20 minutes post Darzalex Faspro injection.

## 2021-09-20 NOTE — Patient Instructions (Signed)
[facility] CANCER CTR AT [place]   Discharge Instructions: Thank you for choosing [facility] to provide your oncology and hematology care.  If you have a lab appointment with the [facility], please go directly to the [place] [place] and check in at the registration area.   We strive to give you quality time with your provider. You may need to reschedule your appointment if you arrive late (15 or more minutes).  Arriving late affects you and other patients whose appointments are after yours.  Also, if you miss three or more appointments without notifying the office, you may be dismissed from the clinic at the providers discretion.      For prescription refill requests, have your pharmacy contact our office and allow 72 hours for refills to be completed.    Today you received the following chemotherapy and/or immunotherapy agents: Darzalex Faspro.      To help prevent nausea and vomiting after your treatment, we encourage you to take your nausea medication as directed.  BELOW ARE SYMPTOMS THAT SHOULD BE REPORTED IMMEDIATELY: *FEVER GREATER THAN 100.4 F (38 C) OR HIGHER *CHILLS OR SWEATING *NAUSEA AND VOMITING THAT IS NOT CONTROLLED WITH YOUR NAUSEA MEDICATION *UNUSUAL SHORTNESS OF BREATH *UNUSUAL BRUISING OR BLEEDING *URINARY PROBLEMS (pain or burning when urinating, or frequent urination) *BOWEL PROBLEMS (unusual diarrhea, constipation, pain near the anus) TENDERNESS IN MOUTH AND THROAT WITH OR WITHOUT PRESENCE OF ULCERS (sore throat, sores in mouth, or a toothache) UNUSUAL RASH, SWELLING OR PAIN  UNUSUAL VAGINAL DISCHARGE OR ITCHING   Items with * indicate a potential emergency and should be followed up as soon as possible or go to the Emergency Department if any problems should occur.  Please show the CHEMOTHERAPY ALERT CARD or IMMUNOTHERAPY ALERT CARD at check-in to the Emergency Department and triage nurse.  Should you have questions after your visit or  need to cancel or reschedule your appointment, please contact [facility] AT [place]  Dept: [phone]  and follow the prompts.  Office hours are 8:00 a.m. to 4:30 p.m. Monday - Friday. Please note that voicemails left after 4:00 p.m. may not be returned until the following business day.  We are closed weekends and major holidays. You have access to a nurse at all times for urgent questions. Please call the main number to the clinic Dept: [phone] and follow the prompts.  For any non-urgent questions, you may also contact your provider using MyChart. We now offer e-Visits for anyone 88 and older to request care online for non-urgent symptoms. For details visit [email or web address].   Also download the MyChart app! Go to the app store, search "MyChart", open the app, select [place], and log in with your MyChart username and password.  Due to Covid, a mask is required upon entering the hospital/clinic. If you do not have a mask, one will be given to you upon arrival. For doctor visits, patients may have 1 support person aged 48 or older with them. For treatment visits, patients cannot have anyone with them due to current Covid guidelines and our immunocompromised population.

## 2021-09-23 DIAGNOSIS — E559 Vitamin D deficiency, unspecified: Secondary | ICD-10-CM | POA: Diagnosis not present

## 2021-09-23 DIAGNOSIS — G3184 Mild cognitive impairment, so stated: Secondary | ICD-10-CM | POA: Diagnosis not present

## 2021-09-23 DIAGNOSIS — N1831 Chronic kidney disease, stage 3a: Secondary | ICD-10-CM | POA: Diagnosis not present

## 2021-09-23 DIAGNOSIS — C9000 Multiple myeloma not having achieved remission: Secondary | ICD-10-CM | POA: Diagnosis not present

## 2021-09-24 ENCOUNTER — Encounter: Payer: Self-pay | Admitting: Oncology

## 2021-09-26 ENCOUNTER — Other Ambulatory Visit: Payer: Self-pay | Admitting: *Deleted

## 2021-09-26 ENCOUNTER — Other Ambulatory Visit: Payer: Self-pay | Admitting: Oncology

## 2021-09-26 MED ORDER — MONTELUKAST SODIUM 10 MG PO TABS: 10.0000 mg | ORAL_TABLET | Freq: Every day | ORAL | 0 refills | Status: DC

## 2021-09-27 ENCOUNTER — Inpatient Hospital Stay: Payer: Medicare Other

## 2021-09-27 ENCOUNTER — Inpatient Hospital Stay (HOSPITAL_BASED_OUTPATIENT_CLINIC_OR_DEPARTMENT_OTHER): Payer: Medicare Other | Admitting: Oncology

## 2021-09-27 ENCOUNTER — Encounter: Payer: Self-pay | Admitting: Oncology

## 2021-09-27 ENCOUNTER — Other Ambulatory Visit: Payer: Self-pay

## 2021-09-27 ENCOUNTER — Inpatient Hospital Stay: Payer: Medicare Other | Attending: Oncology

## 2021-09-27 VITALS — BP 151/82 | HR 62 | Temp 97.9°F | Wt 141.1 lb

## 2021-09-27 DIAGNOSIS — Z8261 Family history of arthritis: Secondary | ICD-10-CM | POA: Diagnosis not present

## 2021-09-27 DIAGNOSIS — E7800 Pure hypercholesterolemia, unspecified: Secondary | ICD-10-CM | POA: Insufficient documentation

## 2021-09-27 DIAGNOSIS — I129 Hypertensive chronic kidney disease with stage 1 through stage 4 chronic kidney disease, or unspecified chronic kidney disease: Secondary | ICD-10-CM | POA: Insufficient documentation

## 2021-09-27 DIAGNOSIS — E46 Unspecified protein-calorie malnutrition: Secondary | ICD-10-CM | POA: Diagnosis not present

## 2021-09-27 DIAGNOSIS — N1831 Chronic kidney disease, stage 3a: Secondary | ICD-10-CM

## 2021-09-27 DIAGNOSIS — D649 Anemia, unspecified: Secondary | ICD-10-CM

## 2021-09-27 DIAGNOSIS — C9000 Multiple myeloma not having achieved remission: Secondary | ICD-10-CM | POA: Insufficient documentation

## 2021-09-27 DIAGNOSIS — Z79899 Other long term (current) drug therapy: Secondary | ICD-10-CM | POA: Diagnosis not present

## 2021-09-27 DIAGNOSIS — Z5112 Encounter for antineoplastic immunotherapy: Secondary | ICD-10-CM | POA: Diagnosis not present

## 2021-09-27 DIAGNOSIS — Z8249 Family history of ischemic heart disease and other diseases of the circulatory system: Secondary | ICD-10-CM | POA: Diagnosis not present

## 2021-09-27 DIAGNOSIS — Z5111 Encounter for antineoplastic chemotherapy: Secondary | ICD-10-CM

## 2021-09-27 DIAGNOSIS — E8809 Other disorders of plasma-protein metabolism, not elsewhere classified: Secondary | ICD-10-CM

## 2021-09-27 DIAGNOSIS — Z803 Family history of malignant neoplasm of breast: Secondary | ICD-10-CM | POA: Diagnosis not present

## 2021-09-27 DIAGNOSIS — Z823 Family history of stroke: Secondary | ICD-10-CM | POA: Diagnosis not present

## 2021-09-27 DIAGNOSIS — M549 Dorsalgia, unspecified: Secondary | ICD-10-CM | POA: Diagnosis not present

## 2021-09-27 DIAGNOSIS — R5383 Other fatigue: Secondary | ICD-10-CM | POA: Insufficient documentation

## 2021-09-27 DIAGNOSIS — M899 Disorder of bone, unspecified: Secondary | ICD-10-CM

## 2021-09-27 LAB — CBC WITH DIFFERENTIAL/PLATELET
Abs Immature Granulocytes: 0 10*3/uL (ref 0.00–0.07)
Basophils Absolute: 0 10*3/uL (ref 0.0–0.1)
Basophils Relative: 1 %
Eosinophils Absolute: 0 10*3/uL (ref 0.0–0.5)
Eosinophils Relative: 1 %
HCT: 28.3 % — ABNORMAL LOW (ref 36.0–46.0)
Hemoglobin: 9 g/dL — ABNORMAL LOW (ref 12.0–15.0)
Immature Granulocytes: 0 %
Lymphocytes Relative: 25 %
Lymphs Abs: 0.7 10*3/uL (ref 0.7–4.0)
MCH: 31.1 pg (ref 26.0–34.0)
MCHC: 31.8 g/dL (ref 30.0–36.0)
MCV: 97.9 fL (ref 80.0–100.0)
Monocytes Absolute: 0.4 10*3/uL (ref 0.1–1.0)
Monocytes Relative: 14 %
Neutro Abs: 1.7 10*3/uL (ref 1.7–7.7)
Neutrophils Relative %: 59 %
Platelets: 195 10*3/uL (ref 150–400)
RBC: 2.89 MIL/uL — ABNORMAL LOW (ref 3.87–5.11)
RDW: 16.6 % — ABNORMAL HIGH (ref 11.5–15.5)
WBC: 2.9 10*3/uL — ABNORMAL LOW (ref 4.0–10.5)
nRBC: 0 % (ref 0.0–0.2)

## 2021-09-27 LAB — COMPREHENSIVE METABOLIC PANEL
ALT: 14 U/L (ref 0–44)
AST: 20 U/L (ref 15–41)
Albumin: 2.9 g/dL — ABNORMAL LOW (ref 3.5–5.0)
Alkaline Phosphatase: 86 U/L (ref 38–126)
Anion gap: 9 (ref 5–15)
BUN: 25 mg/dL — ABNORMAL HIGH (ref 8–23)
CO2: 24 mmol/L (ref 22–32)
Calcium: 9.2 mg/dL (ref 8.9–10.3)
Chloride: 106 mmol/L (ref 98–111)
Creatinine, Ser: 1.03 mg/dL — ABNORMAL HIGH (ref 0.44–1.00)
GFR, Estimated: 53 mL/min — ABNORMAL LOW (ref >60)
Glucose, Bld: 101 mg/dL — ABNORMAL HIGH (ref 70–99)
Potassium: 3.7 mmol/L (ref 3.5–5.1)
Sodium: 139 mmol/L (ref 135–145)
Total Bilirubin: 0.4 mg/dL (ref 0.3–1.2)
Total Protein: 8.5 g/dL — ABNORMAL HIGH (ref 6.5–8.1)

## 2021-09-27 MED ORDER — DARATUMUMAB-HYALURONIDASE-FIHJ 1800-30000 MG-UT/15ML ~~LOC~~ SOLN
1800.0000 mg | Freq: Once | SUBCUTANEOUS | Status: AC
  Administered 2021-09-27: 1800 mg via SUBCUTANEOUS
  Filled 2021-09-27: qty 15

## 2021-09-27 MED ORDER — ZOLEDRONIC ACID 4 MG/5ML IV CONC
3.3000 mg | Freq: Once | INTRAVENOUS | Status: AC
  Administered 2021-09-27: 3.3 mg via INTRAVENOUS
  Filled 2021-09-27: qty 4.13

## 2021-09-27 MED ORDER — DIPHENHYDRAMINE HCL 25 MG PO CAPS
50.0000 mg | ORAL_CAPSULE | Freq: Once | ORAL | Status: AC
  Administered 2021-09-27: 50 mg via ORAL
  Filled 2021-09-27: qty 2

## 2021-09-27 MED ORDER — DEXAMETHASONE 4 MG PO TABS
20.0000 mg | ORAL_TABLET | Freq: Once | ORAL | Status: AC
  Administered 2021-09-27: 20 mg via ORAL
  Filled 2021-09-27: qty 5

## 2021-09-27 MED ORDER — SODIUM CHLORIDE 0.9 % IV SOLN
Freq: Once | INTRAVENOUS | Status: AC
  Filled 2021-09-27: qty 250

## 2021-09-27 MED ORDER — ACETAMINOPHEN 325 MG PO TABS
650.0000 mg | ORAL_TABLET | Freq: Once | ORAL | Status: AC
  Administered 2021-09-27: 650 mg via ORAL
  Filled 2021-09-27: qty 2

## 2021-09-27 NOTE — Patient Instructions (Signed)
[facility] CANCER CTR AT [place]  Discharge Instructions: Thank you for choosing [facility] to provide your oncology and hematology care.  If you have a lab appointment with the [facility], please go directly to the [facility] and check in at the registration area.  Wear comfortable clothing and clothing appropriate for easy access to any Portacath or PICC line.   We strive to give you quality time with your provider. You may need to reschedule your appointment if you arrive late (15 or more minutes).  Arriving late affects you and other patients whose appointments are after yours.  Also, if you miss three or more appointments without notifying the office, you may be dismissed from the clinic at the providers discretion.      For prescription refill requests, have your pharmacy contact our office and allow 72 hours for refills to be completed.    Today you received the following chemotherapy and/or immunotherapy agents: Darzalex-Faspro, Zometa      To help prevent nausea and vomiting after your treatment, we encourage you to take your nausea medication as directed.  BELOW ARE SYMPTOMS THAT SHOULD BE REPORTED IMMEDIATELY: *FEVER GREATER THAN 100.4 F (38 C) OR HIGHER *CHILLS OR SWEATING *NAUSEA AND VOMITING THAT IS NOT CONTROLLED WITH YOUR NAUSEA MEDICATION *UNUSUAL SHORTNESS OF BREATH *UNUSUAL BRUISING OR BLEEDING *URINARY PROBLEMS (pain or burning when urinating, or frequent urination) *BOWEL PROBLEMS (unusual diarrhea, constipation, pain near the anus) TENDERNESS IN MOUTH AND THROAT WITH OR WITHOUT PRESENCE OF ULCERS (sore throat, sores in mouth, or a toothache) UNUSUAL RASH, SWELLING OR PAIN  UNUSUAL VAGINAL DISCHARGE OR ITCHING   Items with * indicate a potential emergency and should be followed up as soon as possible or go to the Emergency Department if any problems should occur.  Please show the CHEMOTHERAPY ALERT CARD or IMMUNOTHERAPY ALERT CARD  at check-in to the Emergency Department and triage nurse.  Should you have questions after your visit or need to cancel or reschedule your appointment, please contact [facility] CANCER [place] AT [place]  [phone] and follow the prompts.  Office hours are 8:00 a.m. to 4:30 p.m. Monday - Friday. Please note that voicemails left after 4:00 p.m. may not be returned until the following business day.  We are closed weekends and major holidays. You have access to a nurse at all times for urgent questions. Please call the main number to the clinic [phone] and follow the prompts.  For any non-urgent questions, you may also contact your provider using MyChart. We now offer e-Visits for anyone 28 and older to request care online for non-urgent symptoms. For details visit [email or web address].   Also download the MyChart app! Go to the app store, search "MyChart", open the app, select [place], and log in with your MyChart username and password.  Due to Covid, a mask is required upon entering the hospital/clinic. If you do not have a mask, one will be given to you upon arrival. For doctor visits, patients may have 1 support person aged 54 or older with them. For treatment visits, patients cannot have anyone with them due to current Covid guidelines and our immunocompromised population. Zoledronic Acid Injection (Hypercalcemia, Oncology) What is this medication? ZOLEDRONIC ACID (ZOE le dron ik AS id) slows calcium loss from bones. It high calcium levels in the blood from some kinds of cancer. It may be used in other people at risk for bone loss. This medicine may be used for other purposes; ask your  health care provider or pharmacist if you have questions. COMMON BRAND NAME(S): Zometa What should I tell my care team before I take this medication? They need to know if you have any of these conditions: cancer dehydration dental disease kidney disease liver disease low levels of calcium  in the blood lung or breathing disease (asthma) receiving steroids like dexamethasone or prednisone an unusual or allergic reaction to zoledronic acid, other medicines, foods, dyes, or preservatives pregnant or trying to get pregnant breast-feeding How should I use this medication? This drug is injected into a vein. It is given by a health care provider in a hospital or clinic setting. Talk to your health care provider about the use of this drug in children. Special care may be needed. Overdosage: If you think you have taken too much of this medicine contact a poison control center or emergency room at once. NOTE: This medicine is only for you. Do not share this medicine with others. What if I miss a dose? Keep appointments for follow-up doses. It is important not to miss your dose. Call your health care provider if you are unable to keep an appointment. What may interact with this medication? certain antibiotics given by injection NSAIDs, medicines for pain and inflammation, like ibuprofen or naproxen some diuretics like bumetanide, furosemide teriparatide thalidomide This list may not describe all possible interactions. Give your health care provider a list of all the medicines, herbs, non-prescription drugs, or dietary supplements you use. Also tell them if you smoke, drink alcohol, or use illegal drugs. Some items may interact with your medicine. What should I watch for while using this medication? Visit your health care provider for regular checks on your progress. It may be some time before you see the benefit from this drug. Some people who take this drug have severe bone, joint, or muscle pain. This drug may also increase your risk for jaw problems or a broken thigh bone. Tell your health care provider right away if you have severe pain in your jaw, bones, joints, or muscles. Tell you health care provider if you have any pain that does not go away or that gets worse. Tell your dentist  and dental surgeon that you are taking this drug. You should not have major dental surgery while on this drug. See your dentist to have a dental exam and fix any dental problems before starting this drug. Take good care of your teeth while on this drug. Make sure you see your dentist for regular follow-up appointments. You should make sure you get enough calcium and vitamin D while you are taking this drug. Discuss the foods you eat and the vitamins you take with your health care provider. Check with your health care provider if you have severe diarrhea, nausea, and vomiting, or if you sweat a lot. The loss of too much body fluid may make it dangerous for you to take this drug. You may need blood work done while you are taking this drug. Do not become pregnant while taking this drug. Women should inform their health care provider if they wish to become pregnant or think they might be pregnant. There is potential for serious harm to an unborn child. Talk to your health care provider for more information. What side effects may I notice from receiving this medication? Side effects that you should report to your doctor or health care provider as soon as possible: allergic reactions (skin rash, itching or hives; swelling of the face, lips, or tongue)  bone pain infection (fever, chills, cough, sore throat, pain or trouble passing urine) jaw pain, especially after dental work joint pain kidney injury (trouble passing urine or change in the amount of urine) low blood pressure (dizziness; feeling faint or lightheaded, falls; unusually weak or tired) low calcium levels (fast heartbeat; muscle cramps or pain; pain, tingling, or numbness in the hands or feet; seizures) low magnesium levels (fast, irregular heartbeat; muscle cramp or pain; muscle weakness; tremors; seizures) low red blood cell counts (trouble breathing; feeling faint; lightheaded, falls; unusually weak or tired) muscle pain redness, blistering,  peeling, or loosening of the skin, including inside the mouth severe diarrhea swelling of the ankles, feet, hands trouble breathing Side effects that usually do not require medical attention (report to your doctor or health care provider if they continue or are bothersome): anxious constipation coughing depressed mood eye irritation, itching, or pain fever general ill feeling or flu-like symptoms nausea pain, redness, or irritation at site where injected trouble sleeping This list may not describe all possible side effects. Call your doctor for medical advice about side effects. You may report side effects to FDA at 1-800-FDA-1088. Where should I keep my medication? This drug is given in a hospital or clinic. It will not be stored at home. NOTE: This sheet is a summary. It may not cover all possible information. If you have questions about this medicine, talk to your doctor, pharmacist, or health care provider.  2022 Elsevier/Gold Standard (2021-04-02 00:00:00) Daratumumab; Hyaluronidase Injection What is this medication? DARATUMUMAB; HYALURONIDASE (dar a toom ue mab / hye al ur ON i dase) is a monoclonal antibody. Hyaluronidase is used to improve the effects of daratumumab. It treats certain types of cancer. Some of the cancers treated are multiple myeloma and light-chain amyloidosis. This medicine may be used for other purposes; ask your health care provider or pharmacist if you have questions. COMMON BRAND NAME(S): DARZALEX FASPRO What should I tell my care team before I take this medication? They need to know if you have any of these conditions: heart disease infection especially a viral infection such as chickenpox, cold sores, herpes, or hepatitis B lung or breathing disease an unusual or allergic reaction to daratumumab, hyaluronidase, other medicines, foods, dyes, or preservatives pregnant or trying to get pregnant breast-feeding How should I use this medication? This  medicine is for injection under the skin. It is given by a health care professional in a hospital or clinic setting. Talk to your pediatrician regarding the use of this medicine in children. Special care may be needed. Overdosage: If you think you have taken too much of this medicine contact a poison control center or emergency room at once. NOTE: This medicine is only for you. Do not share this medicine with others. What if I miss a dose? Keep appointments for follow-up doses as directed. It is important not to miss your dose. Call your doctor or health care professional if you are unable to keep an appointment. What may interact with this medication? Interactions have not been studied. This list may not describe all possible interactions. Give your health care provider a list of all the medicines, herbs, non-prescription drugs, or dietary supplements you use. Also tell them if you smoke, drink alcohol, or use illegal drugs. Some items may interact with your medicine. What should I watch for while using this medication? Your condition will be monitored carefully while you are receiving this medicine. This medicine can cause serious allergic reactions. To reduce your risk,  your health care provider may give you other medicine to take before receiving this one. Be sure to follow the directions from your health care provider. This medicine can affect the results of blood tests to match your blood type. These changes can last for up to 6 months after the final dose. Your healthcare provider will do blood tests to match your blood type before you start treatment. Tell all of your healthcare providers that you are being treated with this medicine before receiving a blood transfusion. This medicine can affect the results of some tests used to determine treatment response; extra tests may be needed to evaluate response. Do not become pregnant while taking this medicine or for 3 months after stopping it. Women  should inform their health care provider if they wish to become pregnant or think they might be pregnant. There is a potential for serious side effects to an unborn child. Talk to your health care provider for more information. Do not breast-feed an infant while taking this medicine. What side effects may I notice from receiving this medication? Side effects that you should report to your care team as soon as possible: Allergic reactions--skin rash, itching or hives, swelling of the face, lips, or tongue Blood clot--chest pain, shortness of breath, pain, swelling or warmth in the leg Blurred vision Fast, irregular heartbeat Infection--fever, chills, cough, sore throat, pain or trouble passing urine Injection reactions--dizziness, fast heartbeat, feeling faint or lightheaded, falls, headache, increase in blood pressure, nausea, vomiting, or wheezing or trouble breathing with loud or whistling sounds Low red blood cell counts--trouble breathing, feeling faint, lightheaded or falls, unusually weak or tired Unusual bleeding or bruising Side effects that usually do not require medical attention (report these to your care team if they continue or are bothersome): Back pain Constipation Diarrhea Pain, tingling, numbness in the hands or feet Pain, redness, or irritation at site where injected Muscle cramp or pain Swelling of the ankles, feet, hands Tiredness Trouble sleeping This list may not describe all possible side effects. Call your doctor for medical advice about side effects. You may report side effects to FDA at 1-800-FDA-1088. Where should I keep my medication? This drug is given in a hospital or clinic and will not be stored at home. NOTE: This sheet is a summary. It may not cover all possible information. If you have questions about this medicine, talk to your doctor, pharmacist, or health care provider.  2022 Elsevier/Gold Standard (2021-04-02 00:00:00)

## 2021-09-27 NOTE — Progress Notes (Signed)
Nutrition Follow-up: ? ?Patient with multiple myeloma.  Patient receiving daratumumab. ? ?Met with patient during infusion.  Patient reports that she is eating a little bit more.  Has been eating 3 meals per day and sometimes snacks in between.  Likes to snack on peanut butter nabs, cheese and crackers and yogurt.  Has tried the sample shakes given and likes ensure/boost shakes.  She has been drinking 1-2 per day.  She says that her daughter brings her a snack or shake sometimes while she is reading and encourages her to eat.   ? ? ? ?Medications: reviewed ? ?Labs: reviewed ? ?Anthropometrics:  ? ?Weight 141 lb 1.6 oz today ? ?143 lb 2/17 ?141 lb 8 oz on 2/14 ?156 lb on 08/08/21 ? ? ?NUTRITION DIAGNOSIS: Unintentional weight loss stable ? ? ? ?INTERVENTION:  ?Continue shakes 1-2 times per day ?Encouraged high protein, high calories foods to prevent weight loss. ?Offered to speak with daughter but patient comfortable with what we discussed today. ?  ? ?MONITORING, EVALUATION, GOAL: weight trends, intake ? ? ?NEXT VISIT: Friday, March 24 during infusion ? ?Mykaela Arena B. Zenia Resides, RD, LDN ?Registered Dietitian ?[phone] [phone] (mobile) ? ? ?

## 2021-09-27 NOTE — Progress Notes (Signed)
Hematology/Oncology Progress note [phone] [phone]         Patient Care Team: Einar Pheasant, MD as PCP - General (Internal Medicine)  REFERRING PROVIDER: Einar Pheasant, MD  CHIEF COMPLAINTS/REASON FOR VISIT:  Follow-up for multiple myeloma  HISTORY OF PRESENTING ILLNESS:   Stephanie Delacruz is a  88 y.o.  female with PMH listed below was seen in consultation at the request of  Einar Pheasant, MD  for evaluation of anemia 07/15/2021, patient had CBC checked at the primary care provider's office.  CBC showed hemoglobin 7.7, hematocrit 23.9, white count 3.6, RDW 16.5.  Platelet count 166. Reviewed previous lab records.  Patient has chronic anemia dating back at least 2016. Her baseline prior to 2018 was about 10.   Hemoglobin was 9.1 in March 2022, and further dropped to 8.1 in October 2022, then November dropped to 7.7. Patient denies being significantly affected, tired, lightheadedness, shortness of breath.  Appetite is fair.  Denies any black or bloody stool.   Denies any history of hemoglobinopathy  08/26/2021 bone marrow biopsy showed hypercellular marrow with plasma cell neoplasm.  67% plasma cells in the aspirate.  Cytogenetics showed complex hyperplasia, gain of 1 q.  # 08/26/2021 started on Daratumumab+ dexamethasone # 09/06/2021 added Revlimid 47m x 14 days.   INTERVAL HISTORY Stephanie Delacruz a 88y.o. female who has above history reviewed by me today presents for follow up visit for management of multiple myeloma Patient reports feeling well.  Fatigue level has improved.  She was accompanied by daughter. Appetite good.  No nausea vomiting diarrhea    Review of Systems  Constitutional:  Positive for fatigue. Negative for appetite change, chills and fever.  HENT:   Negative for hearing loss and voice change.   Eyes:  Negative for eye problems.  Respiratory:  Negative for chest tightness and cough.   Cardiovascular:  Negative for chest pain.   Gastrointestinal:  Negative for abdominal distention, abdominal pain and blood in stool.  Endocrine: Negative for hot flashes.  Genitourinary:  Negative for difficulty urinating and frequency.   Musculoskeletal:  Positive for back pain. Negative for arthralgias.  Skin:  Negative for itching and rash.  Neurological:  Negative for extremity weakness.  Hematological:  Negative for adenopathy.  Psychiatric/Behavioral:  Negative for confusion.    MEDICAL HISTORY:  Past Medical History:  Diagnosis Date   Anemia    GERD (gastroesophageal reflux disease)    Hypercholesterolemia    Hypertension    Renal insufficiency     SURGICAL HISTORY: Past Surgical History:  Procedure Laterality Date   TONSILLECTOMY     TUBAL LIGATION      SOCIAL HISTORY: Social History   Socioeconomic History   Marital status: Married    Spouse name: Not on file   Number of children: Not on file   Years of education: Not on file   Highest education level: Not on file  Occupational History   Not on file  Tobacco Use   Smoking status: Never   Smokeless tobacco: Never  Substance and Sexual Activity   Alcohol use: No   Drug use: No   Sexual activity: Not on file  Other Topics Concern   Not on file  Social History Narrative   Not on file   Social Determinants of Health   Financial Resource Strain: Not on file  Food Insecurity: Not on file  Transportation Needs: Not on file  Physical Activity: Not on file  Stress: Not on  file  Social Connections: Not on file  Intimate Partner Violence: Not on file    FAMILY HISTORY: Family History  Problem Relation Age of Onset   Hypertension Mother    Arthritis Mother    Hypertension Father    Stroke Father    Breast cancer Niece     ALLERGIES:  has No Known Allergies.  MEDICATIONS:  Current Outpatient Medications  Medication Sig Dispense Refill   acyclovir (ZOVIRAX) 400 MG tablet Take 1 tablet (400 mg total) by mouth 2 (two) times daily. 60 tablet  11   amLODipine (NORVASC) 2.5 MG tablet Take 10 mg by mouth daily.     ascorbic acid (VITAMIN C) 500 MG tablet Take 500 mg by mouth daily.     aspirin 81 MG EC tablet Take 81 mg by mouth daily.     donepezil (ARICEPT) 10 MG tablet Take 10 mg by mouth daily.     fenofibrate (TRICOR) 48 MG tablet Take 48 mg by mouth daily.     fexofenadine (ALLEGRA) 180 MG tablet Take by mouth.     hydrochlorothiazide (MICROZIDE) 12.5 MG capsule Take 12.5 mg by mouth daily.     lenalidomide (REVLIMID) 10 MG capsule Take 1 capsule (10 mg total) by mouth daily. Take for 21 days, then hold for 7 days. Repeat every 28 days. 21 capsule 0   losartan (COZAAR) 100 MG tablet Take 100 mg by mouth daily.     memantine (NAMENDA) 5 MG tablet Take 10 mg by mouth 2 (two) times daily.     metoprolol succinate (TOPROL-XL) 25 MG 24 hr tablet Take 50 mg by mouth daily.     montelukast (SINGULAIR) 10 MG tablet Take 1 tablet (10 mg total) by mouth daily. 30 tablet 0   Multiple Vitamins-Minerals (WOMENS MULTIVITAMIN PO) Take 1 tablet by mouth daily.     omeprazole (PRILOSEC) 20 MG capsule Take 20 mg by mouth daily.     potassium chloride (KLOR-CON M) 10 MEQ tablet TAKE 1 TABLET(10 MEQ) BY MOUTH DAILY 30 tablet 2   prochlorperazine (COMPAZINE) 10 MG tablet Take 1 tablet (10 mg total) by mouth every 6 (six) hours as needed (Nausea or vomiting). 30 tablet 1   No current facility-administered medications for this visit.     PHYSICAL EXAMINATION: ECOG PERFORMANCE STATUS: 1 - Symptomatic but completely ambulatory Vitals:   09/27/21 0848  BP: (!) 151/82  Pulse: 62  Temp: 97.9 F (36.6 C)   Filed Weights   09/27/21 0848  Weight: 141 lb 1.6 oz (64 kg)    Physical Exam Constitutional:      General: She is not in acute distress. HENT:     Head: Normocephalic and atraumatic.  Eyes:     General: No scleral icterus. Cardiovascular:     Rate and Rhythm: Normal rate and regular rhythm.     Heart sounds: Normal heart sounds.   Pulmonary:     Effort: Pulmonary effort is normal. No respiratory distress.     Breath sounds: No wheezing.  Abdominal:     General: Bowel sounds are normal. There is no distension.     Palpations: Abdomen is soft.  Musculoskeletal:        General: No deformity. Normal range of motion.     Cervical back: Normal range of motion and neck supple.  Skin:    General: Skin is warm and dry.     Findings: No erythema or rash.  Neurological:     Mental Status: She is  alert and oriented to person, place, and time. Mental status is at baseline.     Cranial Nerves: No cranial nerve deficit.     Coordination: Coordination normal.  Psychiatric:        Mood and Affect: Mood normal.    LABORATORY DATA:  I have reviewed the data as listed Lab Results  Component Value Date   WBC 3.0 (L) 09/20/2021   HGB 9.2 (L) 09/20/2021   HCT 29.4 (L) 09/20/2021   MCV 99.0 09/20/2021   PLT 187 09/20/2021   Recent Labs    06/27/21 0940 07/15/21 1117 09/06/21 0814 09/13/21 0804 09/20/21 0856  NA 142   < > 139 138 142  K 3.8   < > 4.0 3.6 3.7  CL 106   < > 104 101 105  CO2 25   < > _0 GLUCOSE 90   < > 90 101* 89  BUN 19   < > _1 CREATININE 1.20*   < > 1.15* 0.90 1.01*  CALCIUM 10.1   < > 9.7 9.4 9.6  GFRNONAA 44*   < > 47* >60 55*  PROT 9.8*   < > 9.5* 9.1* 9.2*  ALBUMIN 2.8*   < > 2.6* 2.7* 2.9*  AST 20   < > _2 ALT 13   < > _3 ALKPHOS 39   < > 62 75 76  BILITOT 0.5   < > 0.3 <0.1* 0.2*  BILIDIR <0.1  --   --   --   --   IBILI NOT CALCULATED  --   --   --   --    < > = values in this interval not displayed.    Iron/TIBC/Ferritin/ %Sat    Component Value Date/Time   IRON 54 07/24/2021 1212   TIBC 277 07/24/2021 1212   FERRITIN 93 07/24/2021 1212   IRONPCTSAT 20 07/24/2021 1212       RADIOGRAPHIC STUDIES: I have personally reviewed the radiological images as listed and agreed with the findings in the report. No results found.    ASSESSMENT & PLAN:   1. Encounter for antineoplastic chemotherapy   2. Multiple myeloma not having achieved remission ([place])   3. Normocytic anemia   4. Stage 3a chronic kidney disease ([place])   5. Bone lesion   6. Hypoalbuminemia due to protein-calorie malnutrition ([place])    Cancer Staging  Multiple myeloma ([place]) Staging form: Plasma Cell Myeloma and Plasma Cell Disorders, AJCC 8th Edition - Clinical stage from 08/22/2021: RISS Stage II (Beta-2-microglobulin (mg/L): 4.1, Albumin (g/dL): 2.8, ISS: Stage II, High-risk cytogenetics: Absent, LDH: Normal) - Signed by Earlie Server, MD on 09/06/2021    #Multiple myeloma, IgA kappa Labs reviewed and discussed with patient. Proceed with cycle 2 D1 Daratumumab, Dexamethasone 5m x 1.  Start Revlimid 10 mg daily for 21 days  continue Asprin 877mdaily and Acyclovir 40063mID.   #Normocytic anemia, hemoglobin has improved to 9.0.  #Multiple bone lesions identified on PET scan.  No signs of adenopathy or mass.   Proceed with Zometa today.  Recommend calcium 1200 mg daily.   #Chronic kidney disease, recommend patient to avoid nephrotoxin.  Encourage oral hydration. #Hypoalbuminemia secondary to malnutrition.  Follow-up with nutritionist.  Supportive care measures are necessary for patient well-being and will be provided as necessary.  All questions were answered. The patient knows to call the clinic with any problems questions or concerns.  cc ScoNicki Reaper  Randell Patient, MD    Return of visit: 1week for lab and Daratumumab/dexamethasone. 2 weeks for lab NP Daratumumab/dexamethasone 3 weeks lab and Daratumumab/dexamethasone. 4 weeks lab MD Daratumumab/dexamethasone/zometa  Earlie Server, MD, PhD [facility] Hematology Oncology 09/27/2021

## 2021-09-27 NOTE — Progress Notes (Signed)
Patient here for follow up. Patient denies any concerns. °

## 2021-09-30 ENCOUNTER — Telehealth: Payer: Self-pay

## 2021-09-30 LAB — KAPPA/LAMBDA LIGHT CHAINS
Kappa free light chain: 52.5 mg/L — ABNORMAL HIGH (ref 3.3–19.4)
Kappa, lambda light chain ratio: 15.91 — ABNORMAL HIGH (ref 0.26–1.65)
Lambda free light chains: 3.3 mg/L — ABNORMAL LOW (ref 5.7–26.3)

## 2021-09-30 NOTE — Telephone Encounter (Signed)
-----   Message from Earlie Server, MD sent at 09/27/2021  7:22 PM EST ----- ?The plan in 3 weeks (3/24) should be lab/daratumumab, no MD. Other plans remain the same.  ? ?

## 2021-09-30 NOTE — Telephone Encounter (Signed)
Called patient to follow up on Diarrhea episode from last night. Pt reports that diarrhea has subsided and she is feeling better. Would like to hold off on [facility] today. Advised to call back if any new concerns arise. Pt verbalized understanding.  ?

## 2021-10-02 LAB — MULTIPLE MYELOMA PANEL, SERUM
Albumin SerPl Elph-Mcnc: 3.1 g/dL (ref 2.9–4.4)
Albumin/Glob SerPl: 0.7 (ref 0.7–1.7)
Alpha 1: 0.3 g/dL (ref 0.0–0.4)
Alpha2 Glob SerPl Elph-Mcnc: 0.7 g/dL (ref 0.4–1.0)
B-Globulin SerPl Elph-Mcnc: 3.6 g/dL — ABNORMAL HIGH (ref 0.7–1.3)
Gamma Glob SerPl Elph-Mcnc: 0.4 g/dL (ref 0.4–1.8)
Globulin, Total: 5 g/dL — ABNORMAL HIGH (ref 2.2–3.9)
IgA: 3085 mg/dL — ABNORMAL HIGH (ref 64–422)
IgG (Immunoglobin G), Serum: 459 mg/dL — ABNORMAL LOW (ref 586–1602)
IgM (Immunoglobulin M), Srm: 14 mg/dL — ABNORMAL LOW (ref 26–217)
M Protein SerPl Elph-Mcnc: 3.2 g/dL — ABNORMAL HIGH
Total Protein ELP: 8.1 g/dL (ref 6.0–8.5)

## 2021-10-04 ENCOUNTER — Encounter: Payer: Self-pay | Admitting: Oncology

## 2021-10-04 ENCOUNTER — Other Ambulatory Visit: Payer: Self-pay

## 2021-10-04 ENCOUNTER — Inpatient Hospital Stay: Payer: Medicare Other

## 2021-10-04 VITALS — BP 125/53 | HR 65 | Temp 98.1°F | Resp 17 | Wt 141.6 lb

## 2021-10-04 DIAGNOSIS — E7800 Pure hypercholesterolemia, unspecified: Secondary | ICD-10-CM | POA: Diagnosis not present

## 2021-10-04 DIAGNOSIS — Z8249 Family history of ischemic heart disease and other diseases of the circulatory system: Secondary | ICD-10-CM | POA: Diagnosis not present

## 2021-10-04 DIAGNOSIS — D649 Anemia, unspecified: Secondary | ICD-10-CM | POA: Diagnosis not present

## 2021-10-04 DIAGNOSIS — I129 Hypertensive chronic kidney disease with stage 1 through stage 4 chronic kidney disease, or unspecified chronic kidney disease: Secondary | ICD-10-CM | POA: Diagnosis not present

## 2021-10-04 DIAGNOSIS — M549 Dorsalgia, unspecified: Secondary | ICD-10-CM | POA: Diagnosis not present

## 2021-10-04 DIAGNOSIS — Z79899 Other long term (current) drug therapy: Secondary | ICD-10-CM | POA: Diagnosis not present

## 2021-10-04 DIAGNOSIS — C9000 Multiple myeloma not having achieved remission: Secondary | ICD-10-CM

## 2021-10-04 DIAGNOSIS — Z803 Family history of malignant neoplasm of breast: Secondary | ICD-10-CM | POA: Diagnosis not present

## 2021-10-04 DIAGNOSIS — N1831 Chronic kidney disease, stage 3a: Secondary | ICD-10-CM | POA: Diagnosis not present

## 2021-10-04 DIAGNOSIS — Z823 Family history of stroke: Secondary | ICD-10-CM | POA: Diagnosis not present

## 2021-10-04 DIAGNOSIS — E46 Unspecified protein-calorie malnutrition: Secondary | ICD-10-CM | POA: Diagnosis not present

## 2021-10-04 DIAGNOSIS — R5383 Other fatigue: Secondary | ICD-10-CM | POA: Diagnosis not present

## 2021-10-04 DIAGNOSIS — E8809 Other disorders of plasma-protein metabolism, not elsewhere classified: Secondary | ICD-10-CM | POA: Diagnosis not present

## 2021-10-04 DIAGNOSIS — Z8261 Family history of arthritis: Secondary | ICD-10-CM | POA: Diagnosis not present

## 2021-10-04 DIAGNOSIS — Z5112 Encounter for antineoplastic immunotherapy: Secondary | ICD-10-CM | POA: Diagnosis not present

## 2021-10-04 LAB — CBC WITH DIFFERENTIAL/PLATELET
Abs Immature Granulocytes: 0.02 10*3/uL (ref 0.00–0.07)
Basophils Absolute: 0.1 10*3/uL (ref 0.0–0.1)
Basophils Relative: 2 %
Eosinophils Absolute: 0.1 10*3/uL (ref 0.0–0.5)
Eosinophils Relative: 2 %
HCT: 28.4 % — ABNORMAL LOW (ref 36.0–46.0)
Hemoglobin: 9.2 g/dL — ABNORMAL LOW (ref 12.0–15.0)
Immature Granulocytes: 0 %
Lymphocytes Relative: 13 %
Lymphs Abs: 0.6 10*3/uL — ABNORMAL LOW (ref 0.7–4.0)
MCH: 31.7 pg (ref 26.0–34.0)
MCHC: 32.4 g/dL (ref 30.0–36.0)
MCV: 97.9 fL (ref 80.0–100.0)
Monocytes Absolute: 0.3 10*3/uL (ref 0.1–1.0)
Monocytes Relative: 7 %
Neutro Abs: 3.4 10*3/uL (ref 1.7–7.7)
Neutrophils Relative %: 76 %
Platelets: 174 10*3/uL (ref 150–400)
RBC: 2.9 MIL/uL — ABNORMAL LOW (ref 3.87–5.11)
RDW: 16.6 % — ABNORMAL HIGH (ref 11.5–15.5)
WBC: 4.5 10*3/uL (ref 4.0–10.5)
nRBC: 0 % (ref 0.0–0.2)

## 2021-10-04 LAB — COMPREHENSIVE METABOLIC PANEL
ALT: 15 U/L (ref 0–44)
AST: 22 U/L (ref 15–41)
Albumin: 3 g/dL — ABNORMAL LOW (ref 3.5–5.0)
Alkaline Phosphatase: 86 U/L (ref 38–126)
Anion gap: 11 (ref 5–15)
BUN: 20 mg/dL (ref 8–23)
CO2: 22 mmol/L (ref 22–32)
Calcium: 8.7 mg/dL — ABNORMAL LOW (ref 8.9–10.3)
Chloride: 104 mmol/L (ref 98–111)
Creatinine, Ser: 0.92 mg/dL (ref 0.44–1.00)
GFR, Estimated: >60 mL/min (ref >60)
Glucose, Bld: 89 mg/dL (ref 70–99)
Potassium: 3.6 mmol/L (ref 3.5–5.1)
Sodium: 137 mmol/L (ref 135–145)
Total Bilirubin: 0.7 mg/dL (ref 0.3–1.2)
Total Protein: 8.4 g/dL — ABNORMAL HIGH (ref 6.5–8.1)

## 2021-10-04 MED ORDER — DIPHENHYDRAMINE HCL 25 MG PO CAPS
50.0000 mg | ORAL_CAPSULE | Freq: Once | ORAL | Status: AC
  Administered 2021-10-04: 50 mg via ORAL
  Filled 2021-10-04: qty 2

## 2021-10-04 MED ORDER — DEXAMETHASONE 4 MG PO TABS
20.0000 mg | ORAL_TABLET | Freq: Once | ORAL | Status: AC
  Administered 2021-10-04: 20 mg via ORAL
  Filled 2021-10-04: qty 5

## 2021-10-04 MED ORDER — ACETAMINOPHEN 325 MG PO TABS
650.0000 mg | ORAL_TABLET | Freq: Once | ORAL | Status: AC
  Administered 2021-10-04: 650 mg via ORAL
  Filled 2021-10-04: qty 2

## 2021-10-04 MED ORDER — DARATUMUMAB-HYALURONIDASE-FIHJ 1800-30000 MG-UT/15ML ~~LOC~~ SOLN
1800.0000 mg | Freq: Once | SUBCUTANEOUS | Status: AC
  Administered 2021-10-04: 1800 mg via SUBCUTANEOUS
  Filled 2021-10-04: qty 15

## 2021-10-04 NOTE — Patient Instructions (Signed)
[facility] CANCER CTR AT [place]  Discharge Instructions: ?Thank you for choosing [facility] to provide your oncology and hematology care.  ?If you have a lab appointment with the [facility], please go directly to the [facility] and check in at the registration area. ? ?Wear comfortable clothing and clothing appropriate for easy access to any Portacath or PICC line.  ? ?We strive to give you quality time with your provider. You may need to reschedule your appointment if you arrive late (15 or more minutes).  Arriving late affects you and other patients whose appointments are after yours.  Also, if you miss three or more appointments without notifying the office, you may be dismissed from the clinic at the provider?s discretion.    ?  ?For prescription refill requests, have your pharmacy contact our office and allow 72 hours for refills to be completed.   ? ?Today you received the following chemotherapy and/or immunotherapy agents Darzalex    ?  ?To help prevent nausea and vomiting after your treatment, we encourage you to take your nausea medication as directed. ? ?BELOW ARE SYMPTOMS THAT SHOULD BE REPORTED IMMEDIATELY: ?*FEVER GREATER THAN 100.4 F (38 ?C) OR HIGHER ?*CHILLS OR SWEATING ?*NAUSEA AND VOMITING THAT IS NOT CONTROLLED WITH YOUR NAUSEA MEDICATION ?*UNUSUAL SHORTNESS OF BREATH ?*UNUSUAL BRUISING OR BLEEDING ?*URINARY PROBLEMS (pain or burning when urinating, or frequent urination) ?*BOWEL PROBLEMS (unusual diarrhea, constipation, pain near the anus) ?TENDERNESS IN MOUTH AND THROAT WITH OR WITHOUT PRESENCE OF ULCERS (sore throat, sores in mouth, or a toothache) ?UNUSUAL RASH, SWELLING OR PAIN  ?UNUSUAL VAGINAL DISCHARGE OR ITCHING  ? ?Items with * indicate a potential emergency and should be followed up as soon as possible or go to the Emergency Department if any problems should occur. ? ?Please show the CHEMOTHERAPY ALERT CARD or IMMUNOTHERAPY ALERT CARD at check-in to  the Emergency Department and triage nurse. ? ?Should you have questions after your visit or need to cancel or reschedule your appointment, please contact [facility] CANCER [place] AT [place]  [phone] and follow the prompts.  Office hours are 8:00 a.m. to 4:30 p.m. Monday - Friday. Please note that voicemails left after 4:00 p.m. may not be returned until the following business day.  We are closed weekends and major holidays. You have access to a nurse at all times for urgent questions. Please call the main number to the clinic [phone] and follow the prompts. ? ?For any non-urgent questions, you may also contact your provider using MyChart. We now offer e-Visits for anyone 51 and older to request care online for non-urgent symptoms. For details visit [email or web address]. ?  ?Also download the MyChart app! Go to the app store, search "MyChart", open the app, select [facility], and log in with your MyChart username and password. ? ?Due to Covid, a mask is required upon entering the hospital/clinic. If you do not have a mask, one will be given to you upon arrival. For doctor visits, patients may have 1 support person aged 28 or older with them. For treatment visits, patients cannot have anyone with them due to current Covid guidelines and our immunocompromised population.  ?

## 2021-10-04 NOTE — Addendum Note (Signed)
Addended by: Earlie Server on: 10/04/2021 09:55 PM ? ? Modules accepted: Orders ? ?

## 2021-10-11 ENCOUNTER — Other Ambulatory Visit: Payer: Self-pay

## 2021-10-11 ENCOUNTER — Encounter: Payer: Self-pay | Admitting: Nurse Practitioner

## 2021-10-11 ENCOUNTER — Inpatient Hospital Stay (HOSPITAL_BASED_OUTPATIENT_CLINIC_OR_DEPARTMENT_OTHER): Payer: Medicare Other | Admitting: Nurse Practitioner

## 2021-10-11 ENCOUNTER — Inpatient Hospital Stay: Payer: Medicare Other

## 2021-10-11 VITALS — BP 131/58 | HR 62 | Temp 97.2°F | Resp 16 | Ht 62.0 in | Wt 140.7 lb

## 2021-10-11 VITALS — BP 129/53 | HR 62 | Temp 98.7°F | Resp 17

## 2021-10-11 DIAGNOSIS — Z8261 Family history of arthritis: Secondary | ICD-10-CM | POA: Diagnosis not present

## 2021-10-11 DIAGNOSIS — M549 Dorsalgia, unspecified: Secondary | ICD-10-CM | POA: Diagnosis not present

## 2021-10-11 DIAGNOSIS — Z8249 Family history of ischemic heart disease and other diseases of the circulatory system: Secondary | ICD-10-CM | POA: Diagnosis not present

## 2021-10-11 DIAGNOSIS — Z79899 Other long term (current) drug therapy: Secondary | ICD-10-CM | POA: Diagnosis not present

## 2021-10-11 DIAGNOSIS — C9000 Multiple myeloma not having achieved remission: Secondary | ICD-10-CM

## 2021-10-11 DIAGNOSIS — N1831 Chronic kidney disease, stage 3a: Secondary | ICD-10-CM | POA: Diagnosis not present

## 2021-10-11 DIAGNOSIS — Z5112 Encounter for antineoplastic immunotherapy: Secondary | ICD-10-CM | POA: Diagnosis not present

## 2021-10-11 DIAGNOSIS — E8809 Other disorders of plasma-protein metabolism, not elsewhere classified: Secondary | ICD-10-CM | POA: Diagnosis not present

## 2021-10-11 DIAGNOSIS — I129 Hypertensive chronic kidney disease with stage 1 through stage 4 chronic kidney disease, or unspecified chronic kidney disease: Secondary | ICD-10-CM | POA: Diagnosis not present

## 2021-10-11 DIAGNOSIS — Z823 Family history of stroke: Secondary | ICD-10-CM | POA: Diagnosis not present

## 2021-10-11 DIAGNOSIS — D649 Anemia, unspecified: Secondary | ICD-10-CM | POA: Diagnosis not present

## 2021-10-11 DIAGNOSIS — R5383 Other fatigue: Secondary | ICD-10-CM | POA: Diagnosis not present

## 2021-10-11 DIAGNOSIS — Z5111 Encounter for antineoplastic chemotherapy: Secondary | ICD-10-CM | POA: Diagnosis not present

## 2021-10-11 DIAGNOSIS — E7800 Pure hypercholesterolemia, unspecified: Secondary | ICD-10-CM | POA: Diagnosis not present

## 2021-10-11 DIAGNOSIS — Z803 Family history of malignant neoplasm of breast: Secondary | ICD-10-CM | POA: Diagnosis not present

## 2021-10-11 DIAGNOSIS — E46 Unspecified protein-calorie malnutrition: Secondary | ICD-10-CM | POA: Diagnosis not present

## 2021-10-11 LAB — COMPREHENSIVE METABOLIC PANEL
ALT: 14 U/L (ref 0–44)
AST: 19 U/L (ref 15–41)
Albumin: 2.9 g/dL — ABNORMAL LOW (ref 3.5–5.0)
Alkaline Phosphatase: 90 U/L (ref 38–126)
Anion gap: 10 (ref 5–15)
BUN: 22 mg/dL (ref 8–23)
CO2: 24 mmol/L (ref 22–32)
Calcium: 8.8 mg/dL — ABNORMAL LOW (ref 8.9–10.3)
Chloride: 104 mmol/L (ref 98–111)
Creatinine, Ser: 1 mg/dL (ref 0.44–1.00)
GFR, Estimated: 55 mL/min — ABNORMAL LOW (ref >60)
Glucose, Bld: 84 mg/dL (ref 70–99)
Potassium: 4 mmol/L (ref 3.5–5.1)
Sodium: 138 mmol/L (ref 135–145)
Total Bilirubin: 0.3 mg/dL (ref 0.3–1.2)
Total Protein: 7.8 g/dL (ref 6.5–8.1)

## 2021-10-11 LAB — CBC WITH DIFFERENTIAL/PLATELET
Abs Immature Granulocytes: 0.01 10*3/uL (ref 0.00–0.07)
Basophils Absolute: 0.1 10*3/uL (ref 0.0–0.1)
Basophils Relative: 2 %
Eosinophils Absolute: 0.2 10*3/uL (ref 0.0–0.5)
Eosinophils Relative: 4 %
HCT: 27.3 % — ABNORMAL LOW (ref 36.0–46.0)
Hemoglobin: 8.6 g/dL — ABNORMAL LOW (ref 12.0–15.0)
Immature Granulocytes: 0 %
Lymphocytes Relative: 15 %
Lymphs Abs: 0.6 10*3/uL — ABNORMAL LOW (ref 0.7–4.0)
MCH: 30.9 pg (ref 26.0–34.0)
MCHC: 31.5 g/dL (ref 30.0–36.0)
MCV: 98.2 fL (ref 80.0–100.0)
Monocytes Absolute: 0.5 10*3/uL (ref 0.1–1.0)
Monocytes Relative: 14 %
Neutro Abs: 2.5 10*3/uL (ref 1.7–7.7)
Neutrophils Relative %: 65 %
Platelets: 199 10*3/uL (ref 150–400)
RBC: 2.78 MIL/uL — ABNORMAL LOW (ref 3.87–5.11)
RDW: 16.2 % — ABNORMAL HIGH (ref 11.5–15.5)
WBC: 3.9 10*3/uL — ABNORMAL LOW (ref 4.0–10.5)
nRBC: 0 % (ref 0.0–0.2)

## 2021-10-11 MED ORDER — DARATUMUMAB-HYALURONIDASE-FIHJ 1800-30000 MG-UT/15ML ~~LOC~~ SOLN
1800.0000 mg | Freq: Once | SUBCUTANEOUS | Status: AC
  Administered 2021-10-11: 1800 mg via SUBCUTANEOUS
  Filled 2021-10-11: qty 15

## 2021-10-11 MED ORDER — DEXAMETHASONE 4 MG PO TABS
20.0000 mg | ORAL_TABLET | Freq: Once | ORAL | Status: AC
  Administered 2021-10-11: 20 mg via ORAL
  Filled 2021-10-11: qty 5

## 2021-10-11 MED ORDER — DIPHENHYDRAMINE HCL 25 MG PO CAPS
50.0000 mg | ORAL_CAPSULE | Freq: Once | ORAL | Status: AC
  Administered 2021-10-11: 50 mg via ORAL
  Filled 2021-10-11: qty 2

## 2021-10-11 MED ORDER — ACETAMINOPHEN 325 MG PO TABS
650.0000 mg | ORAL_TABLET | Freq: Once | ORAL | Status: AC
  Administered 2021-10-11: 650 mg via ORAL
  Filled 2021-10-11: qty 2

## 2021-10-11 NOTE — Patient Instructions (Signed)
[facility] CANCER CTR AT [place]  Discharge Instructions: ?Thank you for choosing [facility] to provide your oncology and hematology care.  ?If you have a lab appointment with the [facility], please go directly to the [facility] and check in at the registration area. ? ?Wear comfortable clothing and clothing appropriate for easy access to any Portacath or PICC line.  ? ?We strive to give you quality time with your provider. You may need to reschedule your appointment if you arrive late (15 or more minutes).  Arriving late affects you and other patients whose appointments are after yours.  Also, if you miss three or more appointments without notifying the office, you may be dismissed from the clinic at the provider?s discretion.    ?  ?For prescription refill requests, have your pharmacy contact our office and allow 72 hours for refills to be completed.   ? ?Today you received the following chemotherapy and/or immunotherapy agents Darzalex    ?  ?To help prevent nausea and vomiting after your treatment, we encourage you to take your nausea medication as directed. ? ?BELOW ARE SYMPTOMS THAT SHOULD BE REPORTED IMMEDIATELY: ?*FEVER GREATER THAN 100.4 F (38 ?C) OR HIGHER ?*CHILLS OR SWEATING ?*NAUSEA AND VOMITING THAT IS NOT CONTROLLED WITH YOUR NAUSEA MEDICATION ?*UNUSUAL SHORTNESS OF BREATH ?*UNUSUAL BRUISING OR BLEEDING ?*URINARY PROBLEMS (pain or burning when urinating, or frequent urination) ?*BOWEL PROBLEMS (unusual diarrhea, constipation, pain near the anus) ?TENDERNESS IN MOUTH AND THROAT WITH OR WITHOUT PRESENCE OF ULCERS (sore throat, sores in mouth, or a toothache) ?UNUSUAL RASH, SWELLING OR PAIN  ?UNUSUAL VAGINAL DISCHARGE OR ITCHING  ? ?Items with * indicate a potential emergency and should be followed up as soon as possible or go to the Emergency Department if any problems should occur. ? ?Please show the CHEMOTHERAPY ALERT CARD or IMMUNOTHERAPY ALERT CARD at check-in to  the Emergency Department and triage nurse. ? ?Should you have questions after your visit or need to cancel or reschedule your appointment, please contact [facility] CANCER [place] AT [place]  [phone] and follow the prompts.  Office hours are 8:00 a.m. to 4:30 p.m. Monday - Friday. Please note that voicemails left after 4:00 p.m. may not be returned until the following business day.  We are closed weekends and major holidays. You have access to a nurse at all times for urgent questions. Please call the main number to the clinic [phone] and follow the prompts. ? ?For any non-urgent questions, you may also contact your provider using MyChart. We now offer e-Visits for anyone 97 and older to request care online for non-urgent symptoms. For details visit [email or web address]. ?  ?Also download the MyChart app! Go to the app store, search "MyChart", open the app, select [facility], and log in with your MyChart username and password. ? ?Due to Covid, a mask is required upon entering the hospital/clinic. If you do not have a mask, one will be given to you upon arrival. For doctor visits, patients may have 1 support person aged 32 or older with them. For treatment visits, patients cannot have anyone with them due to current Covid guidelines and our immunocompromised population.  ?

## 2021-10-11 NOTE — Progress Notes (Signed)
?Hematology/Oncology Progress Note ?[phone] [phone] ? ?Patient Care Team: ?Einar Pheasant, MD as PCP - General (Internal Medicine) ? ?REFERRING PROVIDER: ?Einar Pheasant, MD  ? ?CHIEF COMPLAINTS/REASON FOR VISIT:  ?Follow-up for multiple myeloma ? ?HISTORY OF PRESENTING ILLNESS: Stephanie Delacruz is a  88 y.o.  female with PMH listed below was seen in consultation at the request of  Einar Pheasant, MD  for evaluation of anemia ?07/15/2021, patient had CBC checked at the primary care provider's office.  CBC showed hemoglobin 7.7, hematocrit 23.9, white count 3.6, RDW 16.5.  Platelet count 166. ?Reviewed previous lab records.  Patient has chronic anemia dating back at least 2016. ?Her baseline prior to 2018 was about 10.   ?Hemoglobin was 9.1 in March 2022, and further dropped to 8.1 in October 2022, then November dropped to 7.7. ?Patient denies being significantly affected, tired, lightheadedness, shortness of breath.  Appetite is fair.  Denies any black or bloody stool.   ?Denies any history of hemoglobinopathy ? ?08/26/2021 bone marrow biopsy showed hypercellular marrow with plasma cell neoplasm.  67% plasma cells in the aspirate.  Cytogenetics showed complex hyperplasia, gain of 1 q. ? ?# 08/26/2021 started on Daratumumab+ dexamethasone ?# 09/06/2021 added Revlimid $RemoveBefore'10mg'XHtIhxSuactHK$  x 14 days.  ? ?INTERVAL HISTORY ?Stephanie Delacruz is a 88 y.o. female with above history of multiple myeloma currently on daratumumab, Dex, Revlimid, who returns to clinic for labs and consideration of continuation of treatment. She feels well and denies complaints. She is taking calcium and vitamin d supplements. No new bone pain. Eating and drinking well. Daughter provides smoothies with ensure. No nausea, vomiting, diarrhea.  ? ?Review of Systems  ?Constitutional:  Negative for appetite change, chills, fatigue and fever.  ?HENT:   Negative for hearing loss and voice change.   ?Eyes:  Negative for eye problems.  ?Respiratory:   Negative for chest tightness and cough.   ?Cardiovascular:  Negative for chest pain.  ?Gastrointestinal:  Negative for abdominal distention, abdominal pain and blood in stool.  ?Endocrine: Negative for hot flashes.  ?Genitourinary:  Negative for difficulty urinating and frequency.   ?Musculoskeletal:  Negative for arthralgias and back pain.  ?Skin:  Negative for itching and rash.  ?Neurological:  Negative for extremity weakness.  ?Hematological:  Negative for adenopathy.  ?Psychiatric/Behavioral:  Negative for confusion and depression. The patient is not nervous/anxious.   ? ?MEDICAL HISTORY:  ?Past Medical History:  ?Diagnosis Date  ? Anemia   ? GERD (gastroesophageal reflux disease)   ? Hypercholesterolemia   ? Hypertension   ? Renal insufficiency   ? ? ?SURGICAL HISTORY: ?Past Surgical History:  ?Procedure Laterality Date  ? TONSILLECTOMY    ? TUBAL LIGATION    ? ? ?SOCIAL HISTORY: ?Social History  ? ?Socioeconomic History  ? Marital status: Married  ?  Spouse name: Not on file  ? Number of children: Not on file  ? Years of education: Not on file  ? Highest education level: Not on file  ?Occupational History  ? Not on file  ?Tobacco Use  ? Smoking status: Never  ? Smokeless tobacco: Never  ?Substance and Sexual Activity  ? Alcohol use: No  ? Drug use: No  ? Sexual activity: Not on file  ?Other Topics Concern  ? Not on file  ?Social History Narrative  ? Not on file  ? ?Social Determinants of Health  ? ?Financial Resource Strain: Not on file  ?Food Insecurity: Not on file  ?Transportation Needs: Not on file  ?  Physical Activity: Not on file  ?Stress: Not on file  ?Social Connections: Not on file  ?Intimate Partner Violence: Not on file  ? ? ?FAMILY HISTORY: ?Family History  ?Problem Relation Age of Onset  ? Hypertension Mother   ? Arthritis Mother   ? Hypertension Father   ? Stroke Father   ? Breast cancer Niece   ? ? ?ALLERGIES:  has No Known Allergies. ? ?MEDICATIONS:  ?Current Outpatient Medications  ?Medication  Sig Dispense Refill  ? acyclovir (ZOVIRAX) 400 MG tablet Take 1 tablet (400 mg total) by mouth 2 (two) times daily. 60 tablet 11  ? amLODipine (NORVASC) 2.5 MG tablet Take 10 mg by mouth daily.    ? ascorbic acid (VITAMIN C) 500 MG tablet Take 500 mg by mouth daily.    ? aspirin 81 MG EC tablet Take 81 mg by mouth daily.    ? donepezil (ARICEPT) 10 MG tablet Take 10 mg by mouth daily.    ? fenofibrate (TRICOR) 48 MG tablet Take 48 mg by mouth daily.    ? fexofenadine (ALLEGRA) 180 MG tablet Take by mouth.    ? hydrochlorothiazide (MICROZIDE) 12.5 MG capsule Take 12.5 mg by mouth daily.    ? lenalidomide (REVLIMID) 10 MG capsule Take 1 capsule (10 mg total) by mouth daily. Take for 21 days, then hold for 7 days. Repeat every 28 days. 21 capsule 0  ? losartan (COZAAR) 100 MG tablet Take 100 mg by mouth daily.    ? memantine (NAMENDA) 5 MG tablet Take 5 mg by mouth 2 (two) times daily.    ? metoprolol succinate (TOPROL-XL) 25 MG 24 hr tablet Take 50 mg by mouth daily.    ? montelukast (SINGULAIR) 10 MG tablet Take 1 tablet (10 mg total) by mouth daily. 30 tablet 0  ? Multiple Vitamins-Minerals (WOMENS MULTIVITAMIN PO) Take 1 tablet by mouth daily.    ? omeprazole (PRILOSEC) 20 MG capsule Take 20 mg by mouth daily.    ? potassium chloride (KLOR-CON M) 10 MEQ tablet TAKE 1 TABLET(10 MEQ) BY MOUTH DAILY 30 tablet 2  ? prochlorperazine (COMPAZINE) 10 MG tablet Take 1 tablet (10 mg total) by mouth every 6 (six) hours as needed (Nausea or vomiting). 30 tablet 1  ? ?No current facility-administered medications for this visit.  ? ? ? ?PHYSICAL EXAMINATION: ?ECOG PERFORMANCE STATUS: 1 - Symptomatic but completely ambulatory ?Vitals:  ? 10/11/21 0957  ?BP: (!) 131/58  ?Pulse: 62  ?Resp: 16  ?Temp: (!) 97.2 ?F (36.2 ?C)  ?SpO2: 98%  ? ?Filed Weights  ? 10/11/21 0957  ?Weight: 140 lb 11.2 oz (63.8 kg)  ? ? ?Physical Exam ?Constitutional:   ?   General: She is not in acute distress. ?   Appearance: She is not ill-appearing.  ?    Comments: Accompanied.   ?HENT:  ?   Head: Normocephalic and atraumatic.  ?Eyes:  ?   General: No scleral icterus. ?Cardiovascular:  ?   Rate and Rhythm: Normal rate and regular rhythm.  ?Pulmonary:  ?   Effort: Pulmonary effort is normal. No respiratory distress.  ?   Breath sounds: No wheezing.  ?Abdominal:  ?   General: There is no distension.  ?   Palpations: Abdomen is soft.  ?Musculoskeletal:     ?   General: No deformity. Normal range of motion.  ?   Cervical back: Normal range of motion and neck supple.  ?Skin: ?   General: Skin is warm and dry.  ?  Findings: No erythema or rash.  ?Neurological:  ?   Mental Status: She is alert and oriented to person, place, and time. Mental status is at baseline.  ?   Cranial Nerves: No cranial nerve deficit.  ?   Coordination: Coordination normal.  ?Psychiatric:     ?   Mood and Affect: Mood normal.     ?   Behavior: Behavior normal.  ? ? ?LABORATORY DATA:  ?I have reviewed the data as listed ?Lab Results  ?Component Value Date  ? WBC 3.9 (L) 10/11/2021  ? HGB 8.6 (L) 10/11/2021  ? HCT 27.3 (L) 10/11/2021  ? MCV 98.2 10/11/2021  ? PLT 199 10/11/2021  ? ?Recent Labs  ?  06/27/21 ?6681 07/15/21 ?1117 09/27/21 ?5947 10/04/21 ?0761 10/11/21 ?0940  ?NA 142   < > 139 137 138  ?K 3.8   < > 3.7 3.6 4.0  ?CL 106   < > 106 104 104  ?CO2 25   < > $R'24 22 24  'Ut$ ?GLUCOSE 90   < > 101* 89 84  ?BUN 19   < > 25* 20 22  ?CREATININE 1.20*   < > 1.03* 0.92 1.00  ?CALCIUM 10.1   < > 9.2 8.7* 8.8*  ?GFRNONAA 44*   < > 53* >60 55*  ?PROT 9.8*   < > 8.5* 8.4* 7.8  ?ALBUMIN 2.8*   < > 2.9* 3.0* 2.9*  ?AST 20   < > $R'20 22 19  'EN$ ?ALT 13   < > $R'14 15 14  'hk$ ?ALKPHOS 39   < > 86 86 90  ?BILITOT 0.5   < > 0.4 0.7 0.3  ?BILIDIR <0.1  --   --   --   --   ?IBILI NOT CALCULATED  --   --   --   --   ? < > = values in this interval not displayed.  ? ? ?Iron/TIBC/Ferritin/ %Sat ?   ?Component Value Date/Time  ? IRON 54 07/24/2021 1212  ? TIBC 277 07/24/2021 1212  ? FERRITIN 93 07/24/2021 1212  ? IRONPCTSAT 20  07/24/2021 1212  ? ?  ? ? ?RADIOGRAPHIC STUDIES: ?I have personally reviewed the radiological images as listed and agreed with the findings in the report. ?No results found. ? ? ? ?ASSESSMENT & PLAN:  ?No diag

## 2021-10-18 ENCOUNTER — Inpatient Hospital Stay: Payer: Medicare Other

## 2021-10-18 ENCOUNTER — Ambulatory Visit: Payer: Medicare Other | Admitting: Oncology

## 2021-10-18 ENCOUNTER — Other Ambulatory Visit: Payer: Self-pay

## 2021-10-18 VITALS — BP 136/55 | HR 67 | Temp 97.3°F | Resp 18 | Ht 62.0 in | Wt 141.4 lb

## 2021-10-18 DIAGNOSIS — Z823 Family history of stroke: Secondary | ICD-10-CM | POA: Diagnosis not present

## 2021-10-18 DIAGNOSIS — C9000 Multiple myeloma not having achieved remission: Secondary | ICD-10-CM

## 2021-10-18 DIAGNOSIS — R5383 Other fatigue: Secondary | ICD-10-CM | POA: Diagnosis not present

## 2021-10-18 DIAGNOSIS — N1831 Chronic kidney disease, stage 3a: Secondary | ICD-10-CM | POA: Diagnosis not present

## 2021-10-18 DIAGNOSIS — E46 Unspecified protein-calorie malnutrition: Secondary | ICD-10-CM | POA: Diagnosis not present

## 2021-10-18 DIAGNOSIS — E7800 Pure hypercholesterolemia, unspecified: Secondary | ICD-10-CM | POA: Diagnosis not present

## 2021-10-18 DIAGNOSIS — E8809 Other disorders of plasma-protein metabolism, not elsewhere classified: Secondary | ICD-10-CM | POA: Diagnosis not present

## 2021-10-18 DIAGNOSIS — Z79899 Other long term (current) drug therapy: Secondary | ICD-10-CM | POA: Diagnosis not present

## 2021-10-18 DIAGNOSIS — Z5112 Encounter for antineoplastic immunotherapy: Secondary | ICD-10-CM | POA: Diagnosis not present

## 2021-10-18 DIAGNOSIS — M549 Dorsalgia, unspecified: Secondary | ICD-10-CM | POA: Diagnosis not present

## 2021-10-18 DIAGNOSIS — Z8249 Family history of ischemic heart disease and other diseases of the circulatory system: Secondary | ICD-10-CM | POA: Diagnosis not present

## 2021-10-18 DIAGNOSIS — I129 Hypertensive chronic kidney disease with stage 1 through stage 4 chronic kidney disease, or unspecified chronic kidney disease: Secondary | ICD-10-CM | POA: Diagnosis not present

## 2021-10-18 DIAGNOSIS — Z803 Family history of malignant neoplasm of breast: Secondary | ICD-10-CM | POA: Diagnosis not present

## 2021-10-18 DIAGNOSIS — D649 Anemia, unspecified: Secondary | ICD-10-CM | POA: Diagnosis not present

## 2021-10-18 DIAGNOSIS — Z8261 Family history of arthritis: Secondary | ICD-10-CM | POA: Diagnosis not present

## 2021-10-18 LAB — CBC WITH DIFFERENTIAL/PLATELET
Abs Immature Granulocytes: 0.02 10*3/uL (ref 0.00–0.07)
Basophils Absolute: 0 10*3/uL (ref 0.0–0.1)
Basophils Relative: 1 %
Eosinophils Absolute: 0.4 10*3/uL (ref 0.0–0.5)
Eosinophils Relative: 10 %
HCT: 26.9 % — ABNORMAL LOW (ref 36.0–46.0)
Hemoglobin: 8.7 g/dL — ABNORMAL LOW (ref 12.0–15.0)
Immature Granulocytes: 1 %
Lymphocytes Relative: 19 %
Lymphs Abs: 0.7 10*3/uL (ref 0.7–4.0)
MCH: 32.1 pg (ref 26.0–34.0)
MCHC: 32.3 g/dL (ref 30.0–36.0)
MCV: 99.3 fL (ref 80.0–100.0)
Monocytes Absolute: 0.7 10*3/uL (ref 0.1–1.0)
Monocytes Relative: 19 %
Neutro Abs: 1.8 10*3/uL (ref 1.7–7.7)
Neutrophils Relative %: 50 %
Platelets: 171 10*3/uL (ref 150–400)
RBC: 2.71 MIL/uL — ABNORMAL LOW (ref 3.87–5.11)
RDW: 16 % — ABNORMAL HIGH (ref 11.5–15.5)
WBC: 3.6 10*3/uL — ABNORMAL LOW (ref 4.0–10.5)
nRBC: 0 % (ref 0.0–0.2)

## 2021-10-18 LAB — COMPREHENSIVE METABOLIC PANEL
ALT: 13 U/L (ref 0–44)
AST: 18 U/L (ref 15–41)
Albumin: 3.1 g/dL — ABNORMAL LOW (ref 3.5–5.0)
Alkaline Phosphatase: 97 U/L (ref 38–126)
Anion gap: 9 (ref 5–15)
BUN: 33 mg/dL — ABNORMAL HIGH (ref 8–23)
CO2: 24 mmol/L (ref 22–32)
Calcium: 8.9 mg/dL (ref 8.9–10.3)
Chloride: 106 mmol/L (ref 98–111)
Creatinine, Ser: 1.2 mg/dL — ABNORMAL HIGH (ref 0.44–1.00)
GFR, Estimated: 44 mL/min — ABNORMAL LOW (ref >60)
Glucose, Bld: 105 mg/dL — ABNORMAL HIGH (ref 70–99)
Potassium: 4.4 mmol/L (ref 3.5–5.1)
Sodium: 139 mmol/L (ref 135–145)
Total Bilirubin: 0.1 mg/dL — ABNORMAL LOW (ref 0.3–1.2)
Total Protein: 7.6 g/dL (ref 6.5–8.1)

## 2021-10-18 MED ORDER — DEXAMETHASONE 4 MG PO TABS
20.0000 mg | ORAL_TABLET | Freq: Once | ORAL | Status: AC
  Administered 2021-10-18: 20 mg via ORAL
  Filled 2021-10-18: qty 5

## 2021-10-18 MED ORDER — ACETAMINOPHEN 325 MG PO TABS
650.0000 mg | ORAL_TABLET | Freq: Once | ORAL | Status: AC
  Administered 2021-10-18: 650 mg via ORAL
  Filled 2021-10-18: qty 2

## 2021-10-18 MED ORDER — DARATUMUMAB-HYALURONIDASE-FIHJ 1800-30000 MG-UT/15ML ~~LOC~~ SOLN
1800.0000 mg | Freq: Once | SUBCUTANEOUS | Status: AC
  Administered 2021-10-18: 1800 mg via SUBCUTANEOUS
  Filled 2021-10-18: qty 15

## 2021-10-18 MED ORDER — DIPHENHYDRAMINE HCL 25 MG PO CAPS
50.0000 mg | ORAL_CAPSULE | Freq: Once | ORAL | Status: AC
  Administered 2021-10-18: 50 mg via ORAL
  Filled 2021-10-18: qty 2

## 2021-10-18 NOTE — Progress Notes (Signed)
Nutrition Follow-up: ? ?Patient with multiple myeloma.  Patient receiving darzalex-faspro.   ? ?Met with patient during infusion. Reports that appetite is pretty good.  Daughter brings her shakes (ensure/boost) snacks to drink and eat.  Usually drinks 1-2 shakes a day.  Denies any nutrition impact symptoms.  ? ? ? ?Medications: reviewed ? ?Labs: reviewed ? ?Anthropometrics:  ? ?Weight 141 lb 6.8 oz today, stable ? ?141 lb 1.6 oz on 3/3 ?143 lb on 2/17 ?141 lb 8 oz on 2/14 ?156 lb on 08/08/21 ? ? ?NUTRITION DIAGNOSIS: Unintentional weight loss stable ? ? ?INTERVENTION:  ?Continue oral nutrition supplements ?Continue high calorie, high protein diet ?  ? ?MONITORING, EVALUATION, GOAL: weight trends, intake ? ? ?NEXT VISIT: as needed ? ?Ohana Birdwell B. Zenia Resides, RD, LDN ?Registered Dietitian ?[phone] ? ? ?

## 2021-10-18 NOTE — Patient Instructions (Signed)
[facility] [place]  Discharge Instructions: ?Thank you for choosing [facility] to provide your oncology and hematology care.  ?If you have a lab appointment with the [facility], please go directly to the [facility] and check in at the registration area. ? ?Wear comfortable clothing and clothing appropriate for easy access to any Portacath or PICC line.  ? ?We strive to give you quality time with your provider. You may need to reschedule your appointment if you arrive late (15 or more minutes).  Arriving late affects you and other patients whose appointments are after yours.  Also, if you miss three or more appointments without notifying the office, you may be dismissed from the clinic at the provider?s discretion.    ?  ?For prescription refill requests, have your pharmacy contact our office and allow 72 hours for refills to be completed.   ? ?Today you received the following chemotherapy and/or immunotherapy agents DARZALEX    ?  ?To help prevent nausea and vomiting after your treatment, we encourage you to take your nausea medication as directed. ? ?BELOW ARE SYMPTOMS THAT SHOULD BE REPORTED IMMEDIATELY: ?*FEVER GREATER THAN 100.4 F (38 ?C) OR HIGHER ?*CHILLS OR SWEATING ?*NAUSEA AND VOMITING THAT IS NOT CONTROLLED WITH YOUR NAUSEA MEDICATION ?*UNUSUAL SHORTNESS OF BREATH ?*UNUSUAL BRUISING OR BLEEDING ?*URINARY PROBLEMS (pain or burning when urinating, or frequent urination) ?*BOWEL PROBLEMS (unusual diarrhea, constipation, pain near the anus) ?TENDERNESS IN MOUTH AND THROAT WITH OR WITHOUT PRESENCE OF ULCERS (sore throat, sores in mouth, or a toothache) ?UNUSUAL RASH, SWELLING OR PAIN  ?UNUSUAL VAGINAL DISCHARGE OR ITCHING  ? ?Items with * indicate a potential emergency and should be followed up as soon as possible or go to the Emergency Department if any problems should occur. ? ?Please show the CHEMOTHERAPY ALERT CARD or IMMUNOTHERAPY ALERT CARD at check-in to  the Emergency Department and triage nurse. ? ?Should you have questions after your visit or need to cancel or reschedule your appointment, please contact [facility] [place] [facility] [place]  [phone] and follow the prompts.  Office hours are 8:00 a.m. to 4:30 p.m. Monday - Friday. Please note that voicemails left after 4:00 p.m. may not be returned until the following business day.  We are closed weekends and major holidays. You have access to a nurse at all times for urgent questions. Please call the main number to the clinic [phone] and follow the prompts. ? ?For any non-urgent questions, you may also contact your provider using MyChart. We now offer e-Visits for anyone 8 and older to request care online for non-urgent symptoms. For details visit [email or web address]. ?  ?Also download the MyChart app! Go to the app store, search "MyChart", open the app, select [facility], and log in with your MyChart username and password. ? ?Due to Covid, a mask is required upon entering the hospital/clinic. If you do not have a mask, one will be given to you upon arrival. For doctor visits, patients may have 1 support person aged 82 or older with them. For treatment visits, patients cannot have anyone with them due to current Covid guidelines and our immunocompromised population.  ? ?Daratumumab injection ?What is this medication? ?DARATUMUMAB (dar a toom ue mab) is a monoclonal antibody. It is used to treat multiple myeloma. ?This medicine may be used for other purposes; ask your health care provider or pharmacist if you have questions. ?COMMON BRAND NAME(S): DARZALEX ?What should I tell my care team before I  take this medication? ?They need to know if you have any of these conditions: ?hereditary fructose intolerance ?infection (especially a virus infection such as chickenpox, herpes, or hepatitis B virus) ?lung or breathing disease (asthma, COPD) ?an unusual or allergic reaction to daratumumab,  sorbitol, other medicines, foods, dyes, or preservatives ?pregnant or trying to get pregnant ?breast-feeding ?How should I use this medication? ?This medicine is for infusion into a vein. It is given by a health care professional in a hospital or clinic setting. ?Talk to your pediatrician regarding the use of this medicine in children. Special care may be needed. ?Overdosage: If you think you have taken too much of this medicine contact a poison control center or emergency room at once. ?NOTE: This medicine is only for you. Do not share this medicine with others. ?What if I miss a dose? ?Keep appointments for follow-up doses as directed. It is important not to miss your dose. Call your doctor or health care professional if you are unable to keep an appointment. ?What may interact with this medication? ?Interactions have not been studied. ?This list may not describe all possible interactions. Give your health care provider a list of all the medicines, herbs, non-prescription drugs, or dietary supplements you use. Also tell them if you smoke, drink alcohol, or use illegal drugs. Some items may interact with your medicine. ?What should I watch for while using this medication? ?Your condition will be monitored carefully while you are receiving this medicine. ?This medicine can cause serious allergic reactions. To reduce your risk, your health care provider may give you other medicine to take before receiving this one. Be sure to follow the directions from your health care provider. ?This medicine can affect the results of blood tests to match your blood type. These changes can last for up to 6 months after the final dose. Your healthcare provider will do blood tests to match your blood type before you start treatment. Tell all of your healthcare providers that you are being treated with this medicine before receiving a blood transfusion. ?This medicine can affect the results of some tests used to determine treatment  response; extra tests may be needed to evaluate response. ?Do not become pregnant while taking this medicine or for 3 months after stopping it. Women should inform their health care provider if they wish to become pregnant or think they might be pregnant. There is a potential for serious side effects to an unborn child. Talk to your health care provider for more information. Do not breast-feed an infant while taking this medicine. ?What side effects may I notice from receiving this medication? ?Side effects that you should report to your care team as soon as possible: ?Allergic reactions--skin rash, itching or hives, swelling of the face, lips, or tongue ?Blurred vision ?Infection--fever, chills, cough, sore throat, pain or trouble passing urine ?Infusion reactions--dizziness, fast heartbeat, feeling faint or lightheaded, falls, headache, increase in blood pressure, nausea, vomiting, or wheezing or trouble breathing with loud or whistling sounds ?Unusual bleeding or bruising ?Side effects that usually do not require medical attention (report to your care team if they continue or are bothersome): ?Constipation ?Diarrhea ?Pain, tingling, numbness in the hands or feet ?Swelling of the ankles, feet, hands ?Tiredness ?This list may not describe all possible side effects. Call your doctor for medical advice about side effects. You may report side effects to FDA at 1-800-FDA-1088. ?Where should I keep my medication? ?This drug is given in a hospital or clinic and will not  be stored at home. ?NOTE: This sheet is a summary. It may not cover all possible information. If you have questions about this medicine, talk to your doctor, pharmacist, or health care provider. ?? 2022 Elsevier/Gold Standard (2020-12-20 00:00:00) ? ?

## 2021-10-22 ENCOUNTER — Encounter: Payer: Self-pay | Admitting: Pharmacist

## 2021-10-22 ENCOUNTER — Other Ambulatory Visit: Payer: Self-pay | Admitting: Pharmacist

## 2021-10-22 DIAGNOSIS — C9000 Multiple myeloma not having achieved remission: Secondary | ICD-10-CM

## 2021-10-23 MED ORDER — LENALIDOMIDE 10 MG PO CAPS: 10.0000 mg | ORAL_CAPSULE | Freq: Every day | ORAL | 0 refills | Status: DC

## 2021-10-24 ENCOUNTER — Other Ambulatory Visit: Payer: Self-pay | Admitting: Oncology

## 2021-10-25 ENCOUNTER — Encounter: Payer: Self-pay | Admitting: Oncology

## 2021-10-25 ENCOUNTER — Inpatient Hospital Stay (HOSPITAL_BASED_OUTPATIENT_CLINIC_OR_DEPARTMENT_OTHER): Payer: Medicare Other | Admitting: Oncology

## 2021-10-25 ENCOUNTER — Inpatient Hospital Stay: Payer: Medicare Other

## 2021-10-25 VITALS — BP 157/70 | HR 58 | Temp 97.0°F | Wt 142.0 lb

## 2021-10-25 VITALS — BP 124/58 | HR 55

## 2021-10-25 DIAGNOSIS — E7800 Pure hypercholesterolemia, unspecified: Secondary | ICD-10-CM | POA: Diagnosis not present

## 2021-10-25 DIAGNOSIS — C9000 Multiple myeloma not having achieved remission: Secondary | ICD-10-CM

## 2021-10-25 DIAGNOSIS — E46 Unspecified protein-calorie malnutrition: Secondary | ICD-10-CM

## 2021-10-25 DIAGNOSIS — R5383 Other fatigue: Secondary | ICD-10-CM | POA: Diagnosis not present

## 2021-10-25 DIAGNOSIS — Z8249 Family history of ischemic heart disease and other diseases of the circulatory system: Secondary | ICD-10-CM | POA: Diagnosis not present

## 2021-10-25 DIAGNOSIS — Z8261 Family history of arthritis: Secondary | ICD-10-CM | POA: Diagnosis not present

## 2021-10-25 DIAGNOSIS — Z5112 Encounter for antineoplastic immunotherapy: Secondary | ICD-10-CM | POA: Diagnosis not present

## 2021-10-25 DIAGNOSIS — D649 Anemia, unspecified: Secondary | ICD-10-CM

## 2021-10-25 DIAGNOSIS — N1831 Chronic kidney disease, stage 3a: Secondary | ICD-10-CM | POA: Diagnosis not present

## 2021-10-25 DIAGNOSIS — Z79899 Other long term (current) drug therapy: Secondary | ICD-10-CM | POA: Diagnosis not present

## 2021-10-25 DIAGNOSIS — E8809 Other disorders of plasma-protein metabolism, not elsewhere classified: Secondary | ICD-10-CM | POA: Diagnosis not present

## 2021-10-25 DIAGNOSIS — M899 Disorder of bone, unspecified: Secondary | ICD-10-CM

## 2021-10-25 DIAGNOSIS — Z803 Family history of malignant neoplasm of breast: Secondary | ICD-10-CM | POA: Diagnosis not present

## 2021-10-25 DIAGNOSIS — Z823 Family history of stroke: Secondary | ICD-10-CM | POA: Diagnosis not present

## 2021-10-25 DIAGNOSIS — Z5111 Encounter for antineoplastic chemotherapy: Secondary | ICD-10-CM | POA: Diagnosis not present

## 2021-10-25 DIAGNOSIS — M549 Dorsalgia, unspecified: Secondary | ICD-10-CM | POA: Diagnosis not present

## 2021-10-25 DIAGNOSIS — I129 Hypertensive chronic kidney disease with stage 1 through stage 4 chronic kidney disease, or unspecified chronic kidney disease: Secondary | ICD-10-CM | POA: Diagnosis not present

## 2021-10-25 LAB — CBC WITH DIFFERENTIAL/PLATELET
Abs Immature Granulocytes: 0 10*3/uL (ref 0.00–0.07)
Basophils Absolute: 0.1 10*3/uL (ref 0.0–0.1)
Basophils Relative: 2 %
Eosinophils Absolute: 0.3 10*3/uL (ref 0.0–0.5)
Eosinophils Relative: 6 %
HCT: 28.8 % — ABNORMAL LOW (ref 36.0–46.0)
Hemoglobin: 9.2 g/dL — ABNORMAL LOW (ref 12.0–15.0)
Immature Granulocytes: 0 %
Lymphocytes Relative: 19 %
Lymphs Abs: 0.8 10*3/uL (ref 0.7–4.0)
MCH: 31.6 pg (ref 26.0–34.0)
MCHC: 31.9 g/dL (ref 30.0–36.0)
MCV: 99 fL (ref 80.0–100.0)
Monocytes Absolute: 0.5 10*3/uL (ref 0.1–1.0)
Monocytes Relative: 12 %
Neutro Abs: 2.7 10*3/uL (ref 1.7–7.7)
Neutrophils Relative %: 61 %
Platelets: 255 10*3/uL (ref 150–400)
RBC: 2.91 MIL/uL — ABNORMAL LOW (ref 3.87–5.11)
RDW: 15.8 % — ABNORMAL HIGH (ref 11.5–15.5)
WBC: 4.4 10*3/uL (ref 4.0–10.5)
nRBC: 0 % (ref 0.0–0.2)

## 2021-10-25 LAB — COMPREHENSIVE METABOLIC PANEL
ALT: 14 U/L (ref 0–44)
AST: 19 U/L (ref 15–41)
Albumin: 3.2 g/dL — ABNORMAL LOW (ref 3.5–5.0)
Alkaline Phosphatase: 84 U/L (ref 38–126)
Anion gap: 8 (ref 5–15)
BUN: 21 mg/dL (ref 8–23)
CO2: 25 mmol/L (ref 22–32)
Calcium: 9.2 mg/dL (ref 8.9–10.3)
Chloride: 104 mmol/L (ref 98–111)
Creatinine, Ser: 1.02 mg/dL — ABNORMAL HIGH (ref 0.44–1.00)
GFR, Estimated: 54 mL/min — ABNORMAL LOW (ref >60)
Glucose, Bld: 96 mg/dL (ref 70–99)
Potassium: 3.8 mmol/L (ref 3.5–5.1)
Sodium: 137 mmol/L (ref 135–145)
Total Bilirubin: 0.5 mg/dL (ref 0.3–1.2)
Total Protein: 7.5 g/dL (ref 6.5–8.1)

## 2021-10-25 MED ORDER — DARATUMUMAB-HYALURONIDASE-FIHJ 1800-30000 MG-UT/15ML ~~LOC~~ SOLN
1800.0000 mg | Freq: Once | SUBCUTANEOUS | Status: AC
  Administered 2021-10-25: 1800 mg via SUBCUTANEOUS
  Filled 2021-10-25: qty 15

## 2021-10-25 MED ORDER — ZOLEDRONIC ACID 4 MG/5ML IV CONC
3.3000 mg | Freq: Once | INTRAVENOUS | Status: AC
  Administered 2021-10-25: 3.3 mg via INTRAVENOUS
  Filled 2021-10-25: qty 4.13

## 2021-10-25 MED ORDER — DEXAMETHASONE 4 MG PO TABS
20.0000 mg | ORAL_TABLET | Freq: Once | ORAL | Status: AC
  Administered 2021-10-25: 20 mg via ORAL
  Filled 2021-10-25: qty 5

## 2021-10-25 MED ORDER — DIPHENHYDRAMINE HCL 25 MG PO CAPS
50.0000 mg | ORAL_CAPSULE | Freq: Once | ORAL | Status: AC
  Administered 2021-10-25: 50 mg via ORAL
  Filled 2021-10-25: qty 2

## 2021-10-25 MED ORDER — SODIUM CHLORIDE 0.9 % IV SOLN
Freq: Once | INTRAVENOUS | Status: AC
  Filled 2021-10-25: qty 250

## 2021-10-25 MED ORDER — ACETAMINOPHEN 325 MG PO TABS
650.0000 mg | ORAL_TABLET | Freq: Once | ORAL | Status: AC
  Administered 2021-10-25: 650 mg via ORAL
  Filled 2021-10-25: qty 2

## 2021-10-25 MED ORDER — DEXAMETHASONE 4 MG PO TABS: 20.0000 mg | ORAL_TABLET | ORAL | 0 refills | Status: DC

## 2021-10-25 MED ORDER — MONTELUKAST SODIUM 10 MG PO TABS: 10.0000 mg | ORAL_TABLET | ORAL | 0 refills | Status: DC

## 2021-10-25 NOTE — Patient Instructions (Signed)
[facility] CANCER CTR AT [place]  Discharge Instructions: ?Thank you for choosing [facility] to provide your oncology and hematology care.  ?If you have a lab appointment with the [facility], please go directly to the [facility] and check in at the registration area. ? ?Wear comfortable clothing and clothing appropriate for easy access to any Portacath or PICC line.  ? ?We strive to give you quality time with your provider. You may need to reschedule your appointment if you arrive late (15 or more minutes).  Arriving late affects you and other patients whose appointments are after yours.  Also, if you miss three or more appointments without notifying the office, you may be dismissed from the clinic at the provider?s discretion.    ?  ?For prescription refill requests, have your pharmacy contact our office and allow 72 hours for refills to be completed.   ? ?Today you received the following chemotherapy and/or immunotherapy agents: Darzalex Faspro, Zometa    ?  ?To help prevent nausea and vomiting after your treatment, we encourage you to take your nausea medication as directed. ? ?BELOW ARE SYMPTOMS THAT SHOULD BE REPORTED IMMEDIATELY: ?*FEVER GREATER THAN 100.4 F (38 ?C) OR HIGHER ?*CHILLS OR SWEATING ?*NAUSEA AND VOMITING THAT IS NOT CONTROLLED WITH YOUR NAUSEA MEDICATION ?*UNUSUAL SHORTNESS OF BREATH ?*UNUSUAL BRUISING OR BLEEDING ?*URINARY PROBLEMS (pain or burning when urinating, or frequent urination) ?*BOWEL PROBLEMS (unusual diarrhea, constipation, pain near the anus) ?TENDERNESS IN MOUTH AND THROAT WITH OR WITHOUT PRESENCE OF ULCERS (sore throat, sores in mouth, or a toothache) ?UNUSUAL RASH, SWELLING OR PAIN  ?UNUSUAL VAGINAL DISCHARGE OR ITCHING  ? ?Items with * indicate a potential emergency and should be followed up as soon as possible or go to the Emergency Department if any problems should occur. ? ?Please show the CHEMOTHERAPY ALERT CARD or IMMUNOTHERAPY ALERT CARD  at check-in to the Emergency Department and triage nurse. ? ?Should you have questions after your visit or need to cancel or reschedule your appointment, please contact [facility] CANCER [place] AT [place]  [phone] and follow the prompts.  Office hours are 8:00 a.m. to 4:30 p.m. Monday - Friday. Please note that voicemails left after 4:00 p.m. may not be returned until the following business day.  We are closed weekends and major holidays. You have access to a nurse at all times for urgent questions. Please call the main number to the clinic [phone] and follow the prompts. ? ?For any non-urgent questions, you may also contact your provider using MyChart. We now offer e-Visits for anyone 10 and older to request care online for non-urgent symptoms. For details visit [email or web address]. ?  ?Also download the MyChart app! Go to the app store, search "MyChart", open the app, select [facility], and log in with your MyChart username and password. ? ?Due to Covid, a mask is required upon entering the hospital/clinic. If you do not have a mask, one will be given to you upon arrival. For doctor visits, patients may have 1 support person aged 31 or older with them. For treatment visits, patients cannot have anyone with them due to current Covid guidelines and our immunocompromised population. Daratumumab; Hyaluronidase Injection ?What is this medication? ?DARATUMUMAB; HYALURONIDASE (dar a toom ue mab / hye al ur ON i dase) is a monoclonal antibody. Hyaluronidase is used to improve the effects of daratumumab. It treats certain types of cancer. Some of the cancers treated are multiple myeloma and light-chain amyloidosis. ?This medicine may be  used for other purposes; ask your health care provider or pharmacist if you have questions. ?COMMON BRAND NAME(S): DARZALEX FASPRO ?What should I tell my care team before I take this medication? ?They need to know if you have any of these conditions: ?heart  disease ?infection especially a viral infection such as chickenpox, cold sores, herpes, or hepatitis B ?lung or breathing disease ?an unusual or allergic reaction to daratumumab, hyaluronidase, other medicines, foods, dyes, or preservatives ?pregnant or trying to get pregnant ?breast-feeding ?How should I use this medication? ?This medicine is for injection under the skin. It is given by a health care professional in a hospital or clinic setting. ?Talk to your pediatrician regarding the use of this medicine in children. Special care may be needed. ?Overdosage: If you think you have taken too much of this medicine contact a poison control center or emergency room at once. ?NOTE: This medicine is only for you. Do not share this medicine with others. ?What if I miss a dose? ?Keep appointments for follow-up doses as directed. It is important not to miss your dose. Call your doctor or health care professional if you are unable to keep an appointment. ?What may interact with this medication? ?Interactions have not been studied. ?This list may not describe all possible interactions. Give your health care provider a list of all the medicines, herbs, non-prescription drugs, or dietary supplements you use. Also tell them if you smoke, drink alcohol, or use illegal drugs. Some items may interact with your medicine. ?What should I watch for while using this medication? ?Your condition will be monitored carefully while you are receiving this medicine. ?This medicine can cause serious allergic reactions. To reduce your risk, your health care provider may give you other medicine to take before receiving this one. Be sure to follow the directions from your health care provider. ?This medicine can affect the results of blood tests to match your blood type. These changes can last for up to 6 months after the final dose. Your healthcare provider will do blood tests to match your blood type before you start treatment. Tell all of your  healthcare providers that you are being treated with this medicine before receiving a blood transfusion. ?This medicine can affect the results of some tests used to determine treatment response; extra tests may be needed to evaluate response. ?Do not become pregnant while taking this medicine or for 3 months after stopping it. Women should inform their health care provider if they wish to become pregnant or think they might be pregnant. There is a potential for serious side effects to an unborn child. Talk to your health care provider for more information. Do not breast-feed an infant while taking this medicine. ?What side effects may I notice from receiving this medication? ?Side effects that you should report to your care team as soon as possible: ?Allergic reactions--skin rash, itching or hives, swelling of the face, lips, or tongue ?Blood clot--chest pain, shortness of breath, pain, swelling or warmth in the leg ?Blurred vision ?Fast, irregular heartbeat ?Infection--fever, chills, cough, sore throat, pain or trouble passing urine ?Injection reactions--dizziness, fast heartbeat, feeling faint or lightheaded, falls, headache, increase in blood pressure, nausea, vomiting, or wheezing or trouble breathing with loud or whistling sounds ?Low red blood cell counts--trouble breathing, feeling faint, lightheaded or falls, unusually weak or tired ?Unusual bleeding or bruising ?Side effects that usually do not require medical attention (report these to your care team if they continue or are bothersome): ?Back pain ?Constipation ?  Diarrhea ?Pain, tingling, numbness in the hands or feet ?Pain, redness, or irritation at site where injected ?Muscle cramp or pain ?Swelling of the ankles, feet, hands ?Tiredness ?Trouble sleeping ?This list may not describe all possible side effects. Call your doctor for medical advice about side effects. You may report side effects to FDA at 1-800-FDA-1088. ?Where should I keep my  medication? ?This drug is given in a hospital or clinic and will not be stored at home. ?NOTE: This sheet is a summary. It may not cover all possible information. If you have questions about this medicine, talk to your doctor

## 2021-10-25 NOTE — Progress Notes (Signed)
?Hematology/Oncology Progress note ?[phone] [phone] ?  ? ?   ? ? ?Patient Care Team: ?Einar Pheasant, MD as PCP - General (Internal Medicine) ? ?REFERRING PROVIDER: ?Einar Pheasant, MD  ?CHIEF COMPLAINTS/REASON FOR VISIT:  ?Follow-up for multiple myeloma ? ?HISTORY OF PRESENTING ILLNESS:  ? ?Stephanie Delacruz is a  88 y.o.  female with PMH listed below was seen in consultation at the request of  Einar Pheasant, MD  for evaluation of anemia ?07/15/2021, patient had CBC checked at the primary care provider's office.  CBC showed hemoglobin 7.7, hematocrit 23.9, white count 3.6, RDW 16.5.  Platelet count 166. ?Reviewed previous lab records.  Patient has chronic anemia dating back at least 2016. ?Her baseline prior to 2018 was about 10.   ?Hemoglobin was 9.1 in March 2022, and further dropped to 8.1 in October 2022, then November dropped to 7.7. ?Patient denies being significantly affected, tired, lightheadedness, shortness of breath.  Appetite is fair.  Denies any black or bloody stool.   ?Denies any history of hemoglobinopathy ? ?08/26/2021 bone marrow biopsy showed hypercellular marrow with plasma cell neoplasm.  67% plasma cells in the aspirate.  Cytogenetics showed complex hyperplasia, gain of 1 q. ? ?# 08/26/2021 started on Daratumumab+ dexamethasone ?# 09/06/2021 added Revlimid 34m x 14 days.  ?#09/27/2021, cycle 2 Revlimid 10 mg for 21 days. ? ?INTERVAL HISTORY ?Stephanie L WRievesis a 88y.o. female who has above history reviewed by me today presents for follow up visit for management of multiple myeloma ?Patient tolerates well.  No new complaints.  Denies any nausea vomiting diarrhea. ?Patient has gained weight. ? ? ?Review of Systems  ?Constitutional:  Positive for fatigue. Negative for appetite change, chills and fever.  ?HENT:   Negative for hearing loss and voice change.   ?Eyes:  Negative for eye problems.  ?Respiratory:  Negative for chest tightness and cough.   ?Cardiovascular:  Negative  for chest pain.  ?Gastrointestinal:  Negative for abdominal distention, abdominal pain and blood in stool.  ?Endocrine: Negative for hot flashes.  ?Genitourinary:  Negative for difficulty urinating and frequency.   ?Musculoskeletal:  Positive for back pain. Negative for arthralgias.  ?Skin:  Negative for itching and rash.  ?Neurological:  Negative for extremity weakness.  ?Hematological:  Negative for adenopathy.  ?Psychiatric/Behavioral:  Negative for confusion.   ? ?MEDICAL HISTORY:  ?Past Medical History:  ?Diagnosis Date  ? Anemia   ? GERD (gastroesophageal reflux disease)   ? Hypercholesterolemia   ? Hypertension   ? Renal insufficiency   ? ? ?SURGICAL HISTORY: ?Past Surgical History:  ?Procedure Laterality Date  ? TONSILLECTOMY    ? TUBAL LIGATION    ? ? ?SOCIAL HISTORY: ?Social History  ? ?Socioeconomic History  ? Marital status: Married  ?  Spouse name: Not on file  ? Number of children: Not on file  ? Years of education: Not on file  ? Highest education level: Not on file  ?Occupational History  ? Not on file  ?Tobacco Use  ? Smoking status: Never  ? Smokeless tobacco: Never  ?Substance and Sexual Activity  ? Alcohol use: No  ? Drug use: No  ? Sexual activity: Not on file  ?Other Topics Concern  ? Not on file  ?Social History Narrative  ? Not on file  ? ?Social Determinants of Health  ? ?Financial Resource Strain: Not on file  ?Food Insecurity: Not on file  ?Transportation Needs: Not on file  ?Physical Activity: Not on file  ?  Stress: Not on file  ?Social Connections: Not on file  ?Intimate Partner Violence: Not on file  ? ? ?FAMILY HISTORY: ?Family History  ?Problem Relation Age of Onset  ? Hypertension Mother   ? Arthritis Mother   ? Hypertension Father   ? Stroke Father   ? Breast cancer Niece   ? ? ?ALLERGIES:  has No Known Allergies. ? ?MEDICATIONS:  ?Current Outpatient Medications  ?Medication Sig Dispense Refill  ? acyclovir (ZOVIRAX) 400 MG tablet Take 1 tablet (400 mg total) by mouth 2 (two) times  daily. 60 tablet 11  ? amLODipine (NORVASC) 2.5 MG tablet Take 10 mg by mouth daily.    ? ascorbic acid (VITAMIN C) 500 MG tablet Take 500 mg by mouth daily.    ? aspirin 81 MG EC tablet Take 81 mg by mouth daily.    ? donepezil (ARICEPT) 10 MG tablet Take 10 mg by mouth daily.    ? fenofibrate (TRICOR) 48 MG tablet Take 48 mg by mouth daily.    ? fexofenadine (ALLEGRA) 180 MG tablet Take by mouth.    ? hydrochlorothiazide (MICROZIDE) 12.5 MG capsule Take 12.5 mg by mouth daily.    ? lenalidomide (REVLIMID) 10 MG capsule Take 1 capsule (10 mg total) by mouth daily. Take for 21 days, then hold for 7 days. Repeat every 28 days. 21 capsule 0  ? losartan (COZAAR) 100 MG tablet Take 100 mg by mouth daily.    ? memantine (NAMENDA) 5 MG tablet Take 5 mg by mouth 2 (two) times daily.    ? metoprolol succinate (TOPROL-XL) 25 MG 24 hr tablet Take 50 mg by mouth daily.    ? montelukast (SINGULAIR) 10 MG tablet Take 1 tablet (10 mg total) by mouth daily. 30 tablet 0  ? Multiple Vitamins-Minerals (WOMENS MULTIVITAMIN PO) Take 1 tablet by mouth daily.    ? omeprazole (PRILOSEC) 20 MG capsule Take 20 mg by mouth daily.    ? potassium chloride (KLOR-CON M) 10 MEQ tablet TAKE 1 TABLET(10 MEQ) BY MOUTH DAILY 30 tablet 2  ? prochlorperazine (COMPAZINE) 10 MG tablet Take 1 tablet (10 mg total) by mouth every 6 (six) hours as needed (Nausea or vomiting). 30 tablet 1  ? ?No current facility-administered medications for this visit.  ? ? ? ?PHYSICAL EXAMINATION: ?ECOG PERFORMANCE STATUS: 1 - Symptomatic but completely ambulatory ?Vitals:  ? 10/25/21 0832  ?BP: (!) 157/70  ?Pulse: (!) 58  ?Temp: (!) 97 ?F (36.1 ?C)  ? ?Filed Weights  ? 10/25/21 3790  ?Weight: 142 lb (64.4 kg)  ? ? ?Physical Exam ?Constitutional:   ?   General: She is not in acute distress. ?HENT:  ?   Head: Normocephalic and atraumatic.  ?Eyes:  ?   General: No scleral icterus. ?Cardiovascular:  ?   Rate and Rhythm: Normal rate and regular rhythm.  ?   Heart sounds: Normal  heart sounds.  ?Pulmonary:  ?   Effort: Pulmonary effort is normal. No respiratory distress.  ?   Breath sounds: No wheezing.  ?Abdominal:  ?   General: Bowel sounds are normal. There is no distension.  ?   Palpations: Abdomen is soft.  ?Musculoskeletal:     ?   General: No deformity. Normal range of motion.  ?   Cervical back: Normal range of motion and neck supple.  ?Skin: ?   General: Skin is warm and dry.  ?   Findings: No erythema or rash.  ?Neurological:  ?   Mental  Status: She is alert and oriented to person, place, and time. Mental status is at baseline.  ?   Cranial Nerves: No cranial nerve deficit.  ?   Coordination: Coordination normal.  ?Psychiatric:     ?   Mood and Affect: Mood normal.  ? ? ?LABORATORY DATA:  ?I have reviewed the data as listed ?Lab Results  ?Component Value Date  ? WBC 4.4 10/25/2021  ? HGB 9.2 (L) 10/25/2021  ? HCT 28.8 (L) 10/25/2021  ? MCV 99.0 10/25/2021  ? PLT 255 10/25/2021  ? ?Recent Labs  ?  06/27/21 ?0940 07/15/21 ?1117 10/04/21 ?2841 10/11/21 ?0940 10/18/21 ?0840  ?NA 142   < > 137 138 139  ?K 3.8   < > 3.6 4.0 4.4  ?CL 106   < > 104 104 106  ?CO2 25   < > '22 24 24  ' ?GLUCOSE 90   < > 89 84 105*  ?BUN 19   < > 20 22 33*  ?CREATININE 1.20*   < > 0.92 1.00 1.20*  ?CALCIUM 10.1   < > 8.7* 8.8* 8.9  ?GFRNONAA 44*   < > >60 55* 44*  ?PROT 9.8*   < > 8.4* 7.8 7.6  ?ALBUMIN 2.8*   < > 3.0* 2.9* 3.1*  ?AST 20   < > '22 19 18  ' ?ALT 13   < > '15 14 13  ' ?ALKPHOS 39   < > 86 90 97  ?BILITOT 0.5   < > 0.7 0.3 0.1*  ?BILIDIR <0.1  --   --   --   --   ?IBILI NOT CALCULATED  --   --   --   --   ? < > = values in this interval not displayed.  ? ? ?Iron/TIBC/Ferritin/ %Sat ?   ?Component Value Date/Time  ? IRON 54 07/24/2021 1212  ? TIBC 277 07/24/2021 1212  ? FERRITIN 93 07/24/2021 1212  ? IRONPCTSAT 20 07/24/2021 1212  ? ?  ? ? ?RADIOGRAPHIC STUDIES: ?I have personally reviewed the radiological images as listed and agreed with the findings in the report. ?No results found. ? ? ? ?ASSESSMENT &  PLAN:  ?1. Multiple myeloma not having achieved remission ([place])   ?2. Encounter for antineoplastic chemotherapy   ?3. Stage 3a chronic kidney disease ([place])   ?4. Normocytic anemia   ?5. Bone lesion

## 2021-10-28 ENCOUNTER — Telehealth: Payer: Self-pay

## 2021-10-28 LAB — KAPPA/LAMBDA LIGHT CHAINS
Kappa free light chain: 39 mg/L — ABNORMAL HIGH (ref 3.3–19.4)
Kappa, lambda light chain ratio: 10 — ABNORMAL HIGH (ref 0.26–1.65)
Lambda free light chains: 3.9 mg/L — ABNORMAL LOW (ref 5.7–26.3)

## 2021-10-28 NOTE — Telephone Encounter (Signed)
-----   Message from Earlie Server, MD sent at 10/25/2021  4:39 PM EDT ----- ?Please add zometa to her visit with me on 4/28 ? ?

## 2021-10-28 NOTE — Telephone Encounter (Signed)
Patient informed to adding Zometa to visit on 4/28.  ? ?Brooke, please add Zometa to appt on 4/28. Patient aware. Thanks.  ?

## 2021-10-30 LAB — MULTIPLE MYELOMA PANEL, SERUM
Albumin SerPl Elph-Mcnc: 3.2 g/dL (ref 2.9–4.4)
Albumin/Glob SerPl: 0.9 (ref 0.7–1.7)
Alpha 1: 0.3 g/dL (ref 0.0–0.4)
Alpha2 Glob SerPl Elph-Mcnc: 0.7 g/dL (ref 0.4–1.0)
B-Globulin SerPl Elph-Mcnc: 2.6 g/dL — ABNORMAL HIGH (ref 0.7–1.3)
Gamma Glob SerPl Elph-Mcnc: 0.5 g/dL (ref 0.4–1.8)
Globulin, Total: 4 g/dL — ABNORMAL HIGH (ref 2.2–3.9)
IgA: 2033 mg/dL — ABNORMAL HIGH (ref 64–422)
IgG (Immunoglobin G), Serum: 485 mg/dL — ABNORMAL LOW (ref 586–1602)
IgM (Immunoglobulin M), Srm: 10 mg/dL — ABNORMAL LOW (ref 26–217)
M Protein SerPl Elph-Mcnc: 1.5 g/dL — ABNORMAL HIGH
Total Protein ELP: 7.2 g/dL (ref 6.0–8.5)

## 2021-11-08 ENCOUNTER — Inpatient Hospital Stay: Payer: Medicare Other | Attending: Oncology

## 2021-11-08 ENCOUNTER — Inpatient Hospital Stay: Payer: Medicare Other

## 2021-11-08 VITALS — BP 144/53 | HR 60 | Temp 96.8°F | Resp 18 | Wt 139.3 lb

## 2021-11-08 DIAGNOSIS — N189 Chronic kidney disease, unspecified: Secondary | ICD-10-CM | POA: Insufficient documentation

## 2021-11-08 DIAGNOSIS — D649 Anemia, unspecified: Secondary | ICD-10-CM | POA: Insufficient documentation

## 2021-11-08 DIAGNOSIS — C9000 Multiple myeloma not having achieved remission: Secondary | ICD-10-CM | POA: Diagnosis not present

## 2021-11-08 DIAGNOSIS — Z5112 Encounter for antineoplastic immunotherapy: Secondary | ICD-10-CM | POA: Diagnosis not present

## 2021-11-08 DIAGNOSIS — Z79899 Other long term (current) drug therapy: Secondary | ICD-10-CM | POA: Diagnosis not present

## 2021-11-08 LAB — CBC WITH DIFFERENTIAL/PLATELET
Abs Immature Granulocytes: 0.01 10*3/uL (ref 0.00–0.07)
Basophils Absolute: 0.1 10*3/uL (ref 0.0–0.1)
Basophils Relative: 3 %
Eosinophils Absolute: 0.4 10*3/uL (ref 0.0–0.5)
Eosinophils Relative: 10 %
HCT: 27 % — ABNORMAL LOW (ref 36.0–46.0)
Hemoglobin: 8.7 g/dL — ABNORMAL LOW (ref 12.0–15.0)
Immature Granulocytes: 0 %
Lymphocytes Relative: 19 %
Lymphs Abs: 0.7 10*3/uL (ref 0.7–4.0)
MCH: 31.9 pg (ref 26.0–34.0)
MCHC: 32.2 g/dL (ref 30.0–36.0)
MCV: 98.9 fL (ref 80.0–100.0)
Monocytes Absolute: 0.4 10*3/uL (ref 0.1–1.0)
Monocytes Relative: 10 %
Neutro Abs: 2.1 10*3/uL (ref 1.7–7.7)
Neutrophils Relative %: 58 %
Platelets: 185 10*3/uL (ref 150–400)
RBC: 2.73 MIL/uL — ABNORMAL LOW (ref 3.87–5.11)
RDW: 15.5 % (ref 11.5–15.5)
WBC: 3.7 10*3/uL — ABNORMAL LOW (ref 4.0–10.5)
nRBC: 0 % (ref 0.0–0.2)

## 2021-11-08 LAB — COMPREHENSIVE METABOLIC PANEL
ALT: 15 U/L (ref 0–44)
AST: 23 U/L (ref 15–41)
Albumin: 3 g/dL — ABNORMAL LOW (ref 3.5–5.0)
Alkaline Phosphatase: 53 U/L (ref 38–126)
Anion gap: 8 (ref 5–15)
BUN: 17 mg/dL (ref 8–23)
CO2: 24 mmol/L (ref 22–32)
Calcium: 8.7 mg/dL — ABNORMAL LOW (ref 8.9–10.3)
Chloride: 107 mmol/L (ref 98–111)
Creatinine, Ser: 0.96 mg/dL (ref 0.44–1.00)
GFR, Estimated: 58 mL/min — ABNORMAL LOW (ref >60)
Glucose, Bld: 105 mg/dL — ABNORMAL HIGH (ref 70–99)
Potassium: 3.7 mmol/L (ref 3.5–5.1)
Sodium: 139 mmol/L (ref 135–145)
Total Bilirubin: 0.4 mg/dL (ref 0.3–1.2)
Total Protein: 6.9 g/dL (ref 6.5–8.1)

## 2021-11-08 MED ORDER — DARATUMUMAB-HYALURONIDASE-FIHJ 1800-30000 MG-UT/15ML ~~LOC~~ SOLN
1800.0000 mg | Freq: Once | SUBCUTANEOUS | Status: AC
  Administered 2021-11-08: 1800 mg via SUBCUTANEOUS
  Filled 2021-11-08: qty 15

## 2021-11-08 MED ORDER — DIPHENHYDRAMINE HCL 25 MG PO CAPS
50.0000 mg | ORAL_CAPSULE | Freq: Once | ORAL | Status: AC
  Administered 2021-11-08: 50 mg via ORAL
  Filled 2021-11-08: qty 2

## 2021-11-08 MED ORDER — ACETAMINOPHEN 325 MG PO TABS
650.0000 mg | ORAL_TABLET | Freq: Once | ORAL | Status: AC
  Administered 2021-11-08: 650 mg via ORAL
  Filled 2021-11-08: qty 2

## 2021-11-08 NOTE — Patient Instructions (Signed)
[facility]  Discharge Instructions: °Thank you for choosing [facility] to provide your oncology and hematology care.  ° °If you have a lab appointment with the Cancer Center, please go directly to the Cancer Center and check in at the registration area. °  °Wear comfortable clothing and clothing appropriate for easy access to any Portacath or PICC line.  ° °We strive to give you quality time with your provider. You may need to reschedule your appointment if you arrive late (15 or more minutes).  Arriving late affects you and other patients whose appointments are after yours.  Also, if you miss three or more appointments without notifying the office, you may be dismissed from the clinic at the provider’s discretion.    °  °For prescription refill requests, have your pharmacy contact our office and allow 72 hours for refills to be completed.   ° °Today you received the following chemotherapy and/or immunotherapy agents     °  °To help prevent nausea and vomiting after your treatment, we encourage you to take your nausea medication as directed. ° °BELOW ARE SYMPTOMS THAT SHOULD BE REPORTED IMMEDIATELY: °*FEVER GREATER THAN 100.4 F (38 °C) OR HIGHER °*CHILLS OR SWEATING °*NAUSEA AND VOMITING THAT IS NOT CONTROLLED WITH YOUR NAUSEA MEDICATION °*UNUSUAL SHORTNESS OF BREATH °*UNUSUAL BRUISING OR BLEEDING °*URINARY PROBLEMS (pain or burning when urinating, or frequent urination) °*BOWEL PROBLEMS (unusual diarrhea, constipation, pain near the anus) °TENDERNESS IN MOUTH AND THROAT WITH OR WITHOUT PRESENCE OF ULCERS (sore throat, sores in mouth, or a toothache) °UNUSUAL RASH, SWELLING OR PAIN  °UNUSUAL VAGINAL DISCHARGE OR ITCHING  ° °Items with * indicate a potential emergency and should be followed up as soon as possible or go to the Emergency Department if any problems should occur. ° °Please show the CHEMOTHERAPY ALERT CARD or IMMUNOTHERAPY ALERT CARD at check-in to the  Emergency Department and triage nurse. ° °Should you have questions after your visit or need to cancel or reschedule your appointment, please contact [facility]  Dept: [phone]  and follow the prompts.  Office hours are 8:00 a.m. to 4:30 p.m. Monday - Friday. Please note that voicemails left after 4:00 p.m. may not be returned until the following business day.  We are closed weekends and major holidays. You have access to a nurse at all times for urgent questions. Please call the main number to the clinic Dept: [phone] and follow the prompts. ° ° °For any non-urgent questions, you may also contact your provider using MyChart. We now offer e-Visits for anyone 18 and older to request care online for non-urgent symptoms. For details visit [email or web address]. °  °Also download the MyChart app! Go to the app store, search "MyChart", open the app, select [facility], and log in with your MyChart username and password. ° °Due to Covid, a mask is required upon entering the hospital/clinic. If you do not have a mask, one will be given to you upon arrival. For doctor visits, patients may have 1 support person aged 18 or older with them. For treatment visits, patients cannot have anyone with them due to current Covid guidelines and our immunocompromised population.  ° °

## 2021-11-15 ENCOUNTER — Other Ambulatory Visit: Payer: Self-pay

## 2021-11-15 ENCOUNTER — Other Ambulatory Visit: Payer: Self-pay | Admitting: *Deleted

## 2021-11-15 DIAGNOSIS — C9000 Multiple myeloma not having achieved remission: Secondary | ICD-10-CM

## 2021-11-15 MED ORDER — LENALIDOMIDE 10 MG PO CAPS: 10.0000 mg | ORAL_CAPSULE | Freq: Every day | ORAL | 0 refills | Status: DC

## 2021-11-18 ENCOUNTER — Encounter: Payer: Self-pay | Admitting: Pharmacist

## 2021-11-18 ENCOUNTER — Other Ambulatory Visit: Payer: Self-pay | Admitting: Pharmacist

## 2021-11-18 DIAGNOSIS — C9000 Multiple myeloma not having achieved remission: Secondary | ICD-10-CM

## 2021-11-18 MED ORDER — LENALIDOMIDE 10 MG PO CAPS: 10.0000 mg | ORAL_CAPSULE | Freq: Every day | ORAL | 0 refills | Status: DC

## 2021-11-22 ENCOUNTER — Inpatient Hospital Stay: Payer: Medicare Other

## 2021-11-22 ENCOUNTER — Encounter: Payer: Self-pay | Admitting: Oncology

## 2021-11-22 ENCOUNTER — Inpatient Hospital Stay (HOSPITAL_BASED_OUTPATIENT_CLINIC_OR_DEPARTMENT_OTHER): Payer: Medicare Other | Admitting: Oncology

## 2021-11-22 ENCOUNTER — Telehealth: Payer: Self-pay

## 2021-11-22 VITALS — BP 145/64 | HR 60 | Temp 98.6°F | Ht 62.0 in | Wt 140.0 lb

## 2021-11-22 VITALS — BP 120/50 | HR 55

## 2021-11-22 DIAGNOSIS — C9000 Multiple myeloma not having achieved remission: Secondary | ICD-10-CM | POA: Diagnosis not present

## 2021-11-22 DIAGNOSIS — D649 Anemia, unspecified: Secondary | ICD-10-CM

## 2021-11-22 DIAGNOSIS — E46 Unspecified protein-calorie malnutrition: Secondary | ICD-10-CM

## 2021-11-22 DIAGNOSIS — Z5111 Encounter for antineoplastic chemotherapy: Secondary | ICD-10-CM | POA: Diagnosis not present

## 2021-11-22 DIAGNOSIS — N1831 Chronic kidney disease, stage 3a: Secondary | ICD-10-CM | POA: Diagnosis not present

## 2021-11-22 DIAGNOSIS — N189 Chronic kidney disease, unspecified: Secondary | ICD-10-CM | POA: Diagnosis not present

## 2021-11-22 DIAGNOSIS — Z5112 Encounter for antineoplastic immunotherapy: Secondary | ICD-10-CM | POA: Diagnosis not present

## 2021-11-22 DIAGNOSIS — E8809 Other disorders of plasma-protein metabolism, not elsewhere classified: Secondary | ICD-10-CM

## 2021-11-22 DIAGNOSIS — Z79899 Other long term (current) drug therapy: Secondary | ICD-10-CM | POA: Diagnosis not present

## 2021-11-22 LAB — CBC WITH DIFFERENTIAL/PLATELET
Abs Immature Granulocytes: 0.02 10*3/uL (ref 0.00–0.07)
Basophils Absolute: 0 10*3/uL (ref 0.0–0.1)
Basophils Relative: 1 %
Eosinophils Absolute: 0.1 10*3/uL (ref 0.0–0.5)
Eosinophils Relative: 1 %
HCT: 29.4 % — ABNORMAL LOW (ref 36.0–46.0)
Hemoglobin: 9.7 g/dL — ABNORMAL LOW (ref 12.0–15.0)
Immature Granulocytes: 1 %
Lymphocytes Relative: 10 %
Lymphs Abs: 0.4 10*3/uL — ABNORMAL LOW (ref 0.7–4.0)
MCH: 32.2 pg (ref 26.0–34.0)
MCHC: 33 g/dL (ref 30.0–36.0)
MCV: 97.7 fL (ref 80.0–100.0)
Monocytes Absolute: 0.1 10*3/uL (ref 0.1–1.0)
Monocytes Relative: 3 %
Neutro Abs: 3.1 10*3/uL (ref 1.7–7.7)
Neutrophils Relative %: 84 %
Platelets: 264 10*3/uL (ref 150–400)
RBC: 3.01 MIL/uL — ABNORMAL LOW (ref 3.87–5.11)
RDW: 15.6 % — ABNORMAL HIGH (ref 11.5–15.5)
WBC: 3.7 10*3/uL — ABNORMAL LOW (ref 4.0–10.5)
nRBC: 0 % (ref 0.0–0.2)

## 2021-11-22 LAB — COMPREHENSIVE METABOLIC PANEL
ALT: 16 U/L (ref 0–44)
AST: 22 U/L (ref 15–41)
Albumin: 3.4 g/dL — ABNORMAL LOW (ref 3.5–5.0)
Alkaline Phosphatase: 60 U/L (ref 38–126)
Anion gap: 8 (ref 5–15)
BUN: 21 mg/dL (ref 8–23)
CO2: 24 mmol/L (ref 22–32)
Calcium: 9.2 mg/dL (ref 8.9–10.3)
Chloride: 103 mmol/L (ref 98–111)
Creatinine, Ser: 1.17 mg/dL — ABNORMAL HIGH (ref 0.44–1.00)
GFR, Estimated: 46 mL/min — ABNORMAL LOW (ref >60)
Glucose, Bld: 113 mg/dL — ABNORMAL HIGH (ref 70–99)
Potassium: 3.8 mmol/L (ref 3.5–5.1)
Sodium: 135 mmol/L (ref 135–145)
Total Bilirubin: 0.5 mg/dL (ref 0.3–1.2)
Total Protein: 7.6 g/dL (ref 6.5–8.1)

## 2021-11-22 MED ORDER — SODIUM CHLORIDE 0.9 % IV SOLN
Freq: Once | INTRAVENOUS | Status: AC
  Filled 2021-11-22: qty 250

## 2021-11-22 MED ORDER — ACETAMINOPHEN 325 MG PO TABS
650.0000 mg | ORAL_TABLET | Freq: Once | ORAL | Status: AC
  Administered 2021-11-22: 650 mg via ORAL
  Filled 2021-11-22: qty 2

## 2021-11-22 MED ORDER — MAGIC MOUTHWASH W/LIDOCAINE: 5.0000 mL | Freq: Four times a day (QID) | ORAL | 1 refills | Status: DC | PRN

## 2021-11-22 MED ORDER — LENALIDOMIDE 10 MG PO CAPS: 10.0000 mg | ORAL_CAPSULE | Freq: Every day | ORAL | 0 refills | Status: DC

## 2021-11-22 MED ORDER — DARATUMUMAB-HYALURONIDASE-FIHJ 1800-30000 MG-UT/15ML ~~LOC~~ SOLN
1800.0000 mg | Freq: Once | SUBCUTANEOUS | Status: AC
  Administered 2021-11-22: 1800 mg via SUBCUTANEOUS
  Filled 2021-11-22: qty 15

## 2021-11-22 MED ORDER — DIPHENHYDRAMINE HCL 25 MG PO CAPS
50.0000 mg | ORAL_CAPSULE | Freq: Once | ORAL | Status: AC
  Administered 2021-11-22: 50 mg via ORAL
  Filled 2021-11-22: qty 2

## 2021-11-22 MED ORDER — ZOLEDRONIC ACID 4 MG/5ML IV CONC
3.3000 mg | Freq: Once | INTRAVENOUS | Status: AC
  Administered 2021-11-22: 3.3 mg via INTRAVENOUS
  Filled 2021-11-22: qty 4.13

## 2021-11-22 NOTE — Telephone Encounter (Signed)
Faxed medicated mouthwash to [facility] in [place].  ?

## 2021-11-22 NOTE — Patient Instructions (Signed)
[facility] AT [place]  Discharge Instructions: ?Thank you for choosing [facility] to provide your oncology and hematology care.  ?If you have a lab appointment with the [facility], please go directly to the [place] and check in at the registration area. ? ?Wear comfortable clothing and clothing appropriate for easy access to any Portacath or PICC line.  ? ?We strive to give you quality time with your provider. You may need to reschedule your appointment if you arrive late (15 or more minutes).  Arriving late affects you and other patients whose appointments are after yours.  Also, if you miss three or more appointments without notifying the office, you may be dismissed from the clinic at the provider?s discretion.    ?  ?For prescription refill requests, have your pharmacy contact our office and allow 72 hours for refills to be completed.   ? ?Today you received the following chemotherapy and/or immunotherapy agents: Darzalex Faspro    ?  ?To help prevent nausea and vomiting after your treatment, we encourage you to take your nausea medication as directed. ? ?BELOW ARE SYMPTOMS THAT SHOULD BE REPORTED IMMEDIATELY: ?*FEVER GREATER THAN 100.4 F (38 ?C) OR HIGHER ?*CHILLS OR SWEATING ?*NAUSEA AND VOMITING THAT IS NOT CONTROLLED WITH YOUR NAUSEA MEDICATION ?*UNUSUAL SHORTNESS OF BREATH ?*UNUSUAL BRUISING OR BLEEDING ?*URINARY PROBLEMS (pain or burning when urinating, or frequent urination) ?*BOWEL PROBLEMS (unusual diarrhea, constipation, pain near the anus) ?TENDERNESS IN MOUTH AND THROAT WITH OR WITHOUT PRESENCE OF ULCERS (sore throat, sores in mouth, or a toothache) ?UNUSUAL RASH, SWELLING OR PAIN  ?UNUSUAL VAGINAL DISCHARGE OR ITCHING  ? ?Items with * indicate a potential emergency and should be followed up as soon as possible or go to the Emergency Department if any problems should occur. ? ?Please show the CHEMOTHERAPY ALERT CARD or IMMUNOTHERAPY ALERT CARD at  check-in to the Emergency Department and triage nurse. ? ?Should you have questions after your visit or need to cancel or reschedule your appointment, please contact [facility] [place] AT [place]  [phone] and follow the prompts.  Office hours are 8:00 a.m. to 4:30 p.m. Monday - Friday. Please note that voicemails left after 4:00 p.m. may not be returned until the following business day.  We are closed weekends and major holidays. You have access to a nurse at all times for urgent questions. Please call the main number to the clinic [phone] and follow the prompts. ? ?For any non-urgent questions, you may also contact your provider using MyChart. We now offer e-Visits for anyone 55 and older to request care online for non-urgent symptoms. For details visit [email or web address]. ?  ?Also download the MyChart app! Go to the app store, search "MyChart", open the app, select [facility], and log in with your MyChart username and password. ? ?Due to Covid, a mask is required upon entering the hospital/clinic. If you do not have a mask, one will be given to you upon arrival. For doctor visits, patients may have 1 support person aged 2 or older with them. For treatment visits, patients cannot have anyone with them due to current Covid guidelines and our immunocompromised population. Daratumumab; Hyaluronidase Injection ?What is this medication? ?DARATUMUMAB; HYALURONIDASE (dar a toom ue mab / hye al ur ON i dase) is a monoclonal antibody. Hyaluronidase is used to improve the effects of daratumumab. It treats certain types of cancer. Some of the cancers treated are multiple myeloma and light-chain amyloidosis. ?This medicine may be used  for other purposes; ask your health care provider or pharmacist if you have questions. ?COMMON BRAND NAME(S): DARZALEX FASPRO ?What should I tell my care team before I take this medication? ?They need to know if you have any of these conditions: ?heart  disease ?infection especially a viral infection such as chickenpox, cold sores, herpes, or hepatitis B ?lung or breathing disease ?an unusual or allergic reaction to daratumumab, hyaluronidase, other medicines, foods, dyes, or preservatives ?pregnant or trying to get pregnant ?breast-feeding ?How should I use this medication? ?This medicine is for injection under the skin. It is given by a health care professional in a hospital or clinic setting. ?Talk to your pediatrician regarding the use of this medicine in children. Special care may be needed. ?Overdosage: If you think you have taken too much of this medicine contact a poison control center or emergency room at once. ?NOTE: This medicine is only for you. Do not share this medicine with others. ?What if I miss a dose? ?Keep appointments for follow-up doses as directed. It is important not to miss your dose. Call your doctor or health care professional if you are unable to keep an appointment. ?What may interact with this medication? ?Interactions have not been studied. ?This list may not describe all possible interactions. Give your health care provider a list of all the medicines, herbs, non-prescription drugs, or dietary supplements you use. Also tell them if you smoke, drink alcohol, or use illegal drugs. Some items may interact with your medicine. ?What should I watch for while using this medication? ?Your condition will be monitored carefully while you are receiving this medicine. ?This medicine can cause serious allergic reactions. To reduce your risk, your health care provider may give you other medicine to take before receiving this one. Be sure to follow the directions from your health care provider. ?This medicine can affect the results of blood tests to match your blood type. These changes can last for up to 6 months after the final dose. Your healthcare provider will do blood tests to match your blood type before you start treatment. Tell all of your  healthcare providers that you are being treated with this medicine before receiving a blood transfusion. ?This medicine can affect the results of some tests used to determine treatment response; extra tests may be needed to evaluate response. ?Do not become pregnant while taking this medicine or for 3 months after stopping it. Women should inform their health care provider if they wish to become pregnant or think they might be pregnant. There is a potential for serious side effects to an unborn child. Talk to your health care provider for more information. Do not breast-feed an infant while taking this medicine. ?What side effects may I notice from receiving this medication? ?Side effects that you should report to your care team as soon as possible: ?Allergic reactions--skin rash, itching or hives, swelling of the face, lips, or tongue ?Blood clot--chest pain, shortness of breath, pain, swelling or warmth in the leg ?Blurred vision ?Fast, irregular heartbeat ?Infection--fever, chills, cough, sore throat, pain or trouble passing urine ?Injection reactions--dizziness, fast heartbeat, feeling faint or lightheaded, falls, headache, increase in blood pressure, nausea, vomiting, or wheezing or trouble breathing with loud or whistling sounds ?Low red blood cell counts--trouble breathing, feeling faint, lightheaded or falls, unusually weak or tired ?Unusual bleeding or bruising ?Side effects that usually do not require medical attention (report these to your care team if they continue or are bothersome): ?Back pain ?Constipation ?Diarrhea ?  Pain, tingling, numbness in the hands or feet ?Pain, redness, or irritation at site where injected ?Muscle cramp or pain ?Swelling of the ankles, feet, hands ?Tiredness ?Trouble sleeping ?This list may not describe all possible side effects. Call your doctor for medical advice about side effects. You may report side effects to FDA at 1-800-FDA-1088. ?Where should I keep my  medication? ?This drug is given in a hospital or clinic and will not be stored at home. ?NOTE: This sheet is a summary. It may not cover all possible information. If you have questions about this medicine, talk to your doctor, pharma

## 2021-11-22 NOTE — Progress Notes (Signed)
?Hematology/Oncology Progress note ?[phone] [phone] ?  ? ?   ? ? ?Patient Care Team: ?Einar Pheasant, MD as PCP - General (Internal Medicine) ? ?REFERRING PROVIDER: ?Einar Pheasant, MD  ?CHIEF COMPLAINTS/REASON FOR VISIT:  ?Follow-up for multiple myeloma ? ?HISTORY OF PRESENTING ILLNESS:  ? ?Stephanie Delacruz is a  88 y.o.  female with PMH listed below was seen in consultation at the request of  Einar Pheasant, MD  for evaluation of anemia ?07/15/2021, patient had CBC checked at the primary care provider's office.  CBC showed hemoglobin 7.7, hematocrit 23.9, white count 3.6, RDW 16.5.  Platelet count 166. ?Reviewed previous lab records.  Patient has chronic anemia dating back at least 2016. ?Her baseline prior to 2018 was about 10.   ?Hemoglobin was 9.1 in March 2022, and further dropped to 8.1 in October 2022, then November dropped to 7.7. ?Patient denies being significantly affected, tired, lightheadedness, shortness of breath.  Appetite is fair.  Denies any black or bloody stool.   ?Denies any history of hemoglobinopathy ? ?08/26/2021 bone marrow biopsy showed hypercellular marrow with plasma cell neoplasm.  67% plasma cells in the aspirate.  Cytogenetics showed complex hyperplasia, gain of 1 q. ? ?# 08/26/2021 started on Daratumumab+ dexamethasone ?# 09/06/2021 added Revlimid 53m x 14 days.  ?#09/27/2021, cycle 2 Revlimid 10 mg for 21 days. ? ?INTERVAL HISTORY ?Stephanie L WAnzaldois a 88y.o. female who has above history reviewed by me today presents for follow up visit for management of multiple myeloma ?Patient tolerates daratumumab -RD regimen well. ?Patient denies any new complaints.  Feeling well today. ? ? ?Review of Systems  ?Constitutional:  Positive for fatigue. Negative for appetite change, chills and fever.  ?HENT:   Negative for hearing loss and voice change.   ?Eyes:  Negative for eye problems.  ?Respiratory:  Negative for chest tightness and cough.   ?Cardiovascular:  Negative for  chest pain.  ?Gastrointestinal:  Negative for abdominal distention, abdominal pain and blood in stool.  ?Endocrine: Negative for hot flashes.  ?Genitourinary:  Negative for difficulty urinating and frequency.   ?Musculoskeletal:  Positive for back pain. Negative for arthralgias.  ?Skin:  Negative for itching and rash.  ?Neurological:  Negative for extremity weakness.  ?Hematological:  Negative for adenopathy.  ?Psychiatric/Behavioral:  Negative for confusion.   ? ?MEDICAL HISTORY:  ?Past Medical History:  ?Diagnosis Date  ? Anemia   ? GERD (gastroesophageal reflux disease)   ? Hypercholesterolemia   ? Hypertension   ? Renal insufficiency   ? ? ?SURGICAL HISTORY: ?Past Surgical History:  ?Procedure Laterality Date  ? TONSILLECTOMY    ? TUBAL LIGATION    ? ? ?SOCIAL HISTORY: ?Social History  ? ?Socioeconomic History  ? Marital status: Married  ?  Spouse name: Not on file  ? Number of children: Not on file  ? Years of education: Not on file  ? Highest education level: Not on file  ?Occupational History  ? Not on file  ?Tobacco Use  ? Smoking status: Never  ? Smokeless tobacco: Never  ?Substance and Sexual Activity  ? Alcohol use: No  ? Drug use: No  ? Sexual activity: Not on file  ?Other Topics Concern  ? Not on file  ?Social History Narrative  ? Not on file  ? ?Social Determinants of Health  ? ?Financial Resource Strain: Not on file  ?Food Insecurity: Not on file  ?Transportation Needs: Not on file  ?Physical Activity: Not on file  ?Stress: Not  on file  ?Social Connections: Not on file  ?Intimate Partner Violence: Not on file  ? ? ?FAMILY HISTORY: ?Family History  ?Problem Relation Age of Onset  ? Hypertension Mother   ? Arthritis Mother   ? Hypertension Father   ? Stroke Father   ? Breast cancer Niece   ? ? ?ALLERGIES:  has No Known Allergies. ? ?MEDICATIONS:  ?Current Outpatient Medications  ?Medication Sig Dispense Refill  ? acyclovir (ZOVIRAX) 400 MG tablet Take 1 tablet (400 mg total) by mouth 2 (two) times  daily. 60 tablet 11  ? amLODipine (NORVASC) 2.5 MG tablet Take 10 mg by mouth daily.    ? ascorbic acid (VITAMIN C) 500 MG tablet Take 500 mg by mouth daily.    ? aspirin 81 MG EC tablet Take 81 mg by mouth daily.    ? dexamethasone (DECADRON) 4 MG tablet Take 5 tablets (20 mg total) by mouth once a week. 60 tablet 0  ? donepezil (ARICEPT) 10 MG tablet Take 10 mg by mouth daily.    ? fenofibrate (TRICOR) 48 MG tablet Take 48 mg by mouth daily.    ? fexofenadine (ALLEGRA) 180 MG tablet Take by mouth.    ? hydrochlorothiazide (MICROZIDE) 12.5 MG capsule Take 12.5 mg by mouth daily.    ? losartan (COZAAR) 100 MG tablet Take 100 mg by mouth daily.    ? magic mouthwash w/lidocaine SOLN Take 5 mLs by mouth 4 (four) times daily as needed for mouth pain. Sig: Swish/Swallow 5-10 ml four times a day as needed. Dispense 480 ml. 1RF 480 mL 1  ? memantine (NAMENDA) 5 MG tablet Take 5 mg by mouth 2 (two) times daily.    ? metoprolol succinate (TOPROL-XL) 25 MG 24 hr tablet Take 50 mg by mouth daily.    ? montelukast (SINGULAIR) 10 MG tablet Take 1 tablet (10 mg total) by mouth See admin instructions. Take 1 tablet one day before Daratumumab injection. 60 tablet 0  ? Multiple Vitamins-Minerals (WOMENS MULTIVITAMIN PO) Take 1 tablet by mouth daily.    ? omeprazole (PRILOSEC) 20 MG capsule Take 20 mg by mouth daily.    ? potassium chloride (KLOR-CON M) 10 MEQ tablet TAKE 1 TABLET(10 MEQ) BY MOUTH DAILY 30 tablet 2  ? prochlorperazine (COMPAZINE) 10 MG tablet Take 1 tablet (10 mg total) by mouth every 6 (six) hours as needed (Nausea or vomiting). 30 tablet 1  ? lenalidomide (REVLIMID) 10 MG capsule Take 1 capsule (10 mg total) by mouth daily. Take for 21 days, then hold for 7 days. Repeat every 28 days. 21 capsule 0  ? ?No current facility-administered medications for this visit.  ? ? ? ?PHYSICAL EXAMINATION: ?ECOG PERFORMANCE STATUS: 1 - Symptomatic but completely ambulatory ?Vitals:  ? 11/22/21 0946  ?BP: (!) 145/64  ?Pulse: 60   ?Temp: 98.6 ?F (37 ?C)  ? ?Filed Weights  ? 11/22/21 0946  ?Weight: 140 lb (63.5 kg)  ? ? ?Physical Exam ?Constitutional:   ?   General: She is not in acute distress. ?HENT:  ?   Head: Normocephalic and atraumatic.  ?Eyes:  ?   General: No scleral icterus. ?Cardiovascular:  ?   Rate and Rhythm: Normal rate and regular rhythm.  ?   Heart sounds: Normal heart sounds.  ?Pulmonary:  ?   Effort: Pulmonary effort is normal. No respiratory distress.  ?   Breath sounds: No wheezing.  ?Abdominal:  ?   General: Bowel sounds are normal. There is no distension.  ?  Palpations: Abdomen is soft.  ?Musculoskeletal:     ?   General: No deformity. Normal range of motion.  ?   Cervical back: Normal range of motion and neck supple.  ?Skin: ?   General: Skin is warm and dry.  ?   Findings: No erythema or rash.  ?Neurological:  ?   Mental Status: She is alert and oriented to person, place, and time. Mental status is at baseline.  ?   Cranial Nerves: No cranial nerve deficit.  ?   Coordination: Coordination normal.  ?Psychiatric:     ?   Mood and Affect: Mood normal.  ? ? ?LABORATORY DATA:  ?I have reviewed the data as listed ?Lab Results  ?Component Value Date  ? WBC 3.7 (L) 11/22/2021  ? HGB 9.7 (L) 11/22/2021  ? HCT 29.4 (L) 11/22/2021  ? MCV 97.7 11/22/2021  ? PLT 264 11/22/2021  ? ?Recent Labs  ?  06/27/21 ?0940 07/15/21 ?1117 10/25/21 ?0811 11/08/21 ?0813 11/22/21 ?0924  ?NA 142   < > 137 139 135  ?K 3.8   < > 3.8 3.7 3.8  ?CL 106   < > 104 107 103  ?CO2 25   < > 25 24 24  ?GLUCOSE 90   < > 96 105* 113*  ?BUN 19   < > 21 17 21  ?CREATININE 1.20*   < > 1.02* 0.96 1.17*  ?CALCIUM 10.1   < > 9.2 8.7* 9.2  ?GFRNONAA 44*   < > 54* 58* 46*  ?PROT 9.8*   < > 7.5 6.9 7.6  ?ALBUMIN 2.8*   < > 3.2* 3.0* 3.4*  ?AST 20   < > 19 23 22  ?ALT 13   < > 14 15 16  ?ALKPHOS 39   < > 84 53 60  ?BILITOT 0.5   < > 0.5 0.4 0.5  ?BILIDIR <0.1  --   --   --   --   ?IBILI NOT CALCULATED  --   --   --   --   ? < > = values in this interval not displayed.   ? ? ?Iron/TIBC/Ferritin/ %Sat ?   ?Component Value Date/Time  ? IRON 54 07/24/2021 1212  ? TIBC 277 07/24/2021 1212  ? FERRITIN 93 07/24/2021 1212  ? IRONPCTSAT 20 07/24/2021 1212  ? ?  ? ? ?RADIOGRAPHIC STUD

## 2021-11-25 ENCOUNTER — Ambulatory Visit: Payer: Medicare Other | Admitting: Internal Medicine

## 2021-11-25 LAB — KAPPA/LAMBDA LIGHT CHAINS
Kappa free light chain: 31.5 mg/L — ABNORMAL HIGH (ref 3.3–19.4)
Kappa, lambda light chain ratio: 5.34 — ABNORMAL HIGH (ref 0.26–1.65)
Lambda free light chains: 5.9 mg/L (ref 5.7–26.3)

## 2021-11-26 ENCOUNTER — Encounter: Payer: Self-pay | Admitting: Internal Medicine

## 2021-11-26 ENCOUNTER — Encounter: Payer: Self-pay | Admitting: Oncology

## 2021-11-27 ENCOUNTER — Ambulatory Visit: Payer: Medicare Other | Admitting: Internal Medicine

## 2021-11-27 ENCOUNTER — Other Ambulatory Visit: Payer: Self-pay

## 2021-11-27 ENCOUNTER — Other Ambulatory Visit: Payer: Self-pay | Admitting: Internal Medicine

## 2021-11-27 LAB — MULTIPLE MYELOMA PANEL, SERUM
Albumin SerPl Elph-Mcnc: 3.6 g/dL (ref 2.9–4.4)
Albumin/Glob SerPl: 1.1 (ref 0.7–1.7)
Alpha 1: 0.3 g/dL (ref 0.0–0.4)
Alpha2 Glob SerPl Elph-Mcnc: 0.7 g/dL (ref 0.4–1.0)
B-Globulin SerPl Elph-Mcnc: 2.1 g/dL — ABNORMAL HIGH (ref 0.7–1.3)
Gamma Glob SerPl Elph-Mcnc: 0.4 g/dL (ref 0.4–1.8)
Globulin, Total: 3.5 g/dL (ref 2.2–3.9)
IgA: 1359 mg/dL — ABNORMAL HIGH (ref 64–422)
IgG (Immunoglobin G), Serum: 493 mg/dL — ABNORMAL LOW (ref 586–1602)
IgM (Immunoglobulin M), Srm: 13 mg/dL — ABNORMAL LOW (ref 26–217)
M Protein SerPl Elph-Mcnc: 1.3 g/dL — ABNORMAL HIGH
Total Protein ELP: 7.1 g/dL (ref 6.0–8.5)

## 2021-11-27 MED ORDER — OMEPRAZOLE 20 MG PO CPDR: 20.0000 mg | DELAYED_RELEASE_CAPSULE | Freq: Every day | ORAL | 1 refills | Status: DC

## 2021-11-29 ENCOUNTER — Encounter: Payer: Self-pay | Admitting: Internal Medicine

## 2021-11-29 ENCOUNTER — Ambulatory Visit (INDEPENDENT_AMBULATORY_CARE_PROVIDER_SITE_OTHER): Payer: Medicare Other | Admitting: Internal Medicine

## 2021-11-29 DIAGNOSIS — R0989 Other specified symptoms and signs involving the circulatory and respiratory systems: Secondary | ICD-10-CM

## 2021-11-29 DIAGNOSIS — E7800 Pure hypercholesterolemia, unspecified: Secondary | ICD-10-CM

## 2021-11-29 DIAGNOSIS — I10 Essential (primary) hypertension: Secondary | ICD-10-CM

## 2021-11-29 DIAGNOSIS — Z862 Personal history of diseases of the blood and blood-forming organs and certain disorders involving the immune mechanism: Secondary | ICD-10-CM

## 2021-11-29 DIAGNOSIS — N1831 Chronic kidney disease, stage 3a: Secondary | ICD-10-CM

## 2021-11-29 DIAGNOSIS — C9000 Multiple myeloma not having achieved remission: Secondary | ICD-10-CM

## 2021-11-29 DIAGNOSIS — M7989 Other specified soft tissue disorders: Secondary | ICD-10-CM

## 2021-11-29 NOTE — Telephone Encounter (Signed)
Please advise 

## 2021-11-29 NOTE — Progress Notes (Signed)
Patient ID: Stephanie Delacruz, female   DOB: [birth year], 88 y.o.   MRN: [id number] ? ? ?Subjective:  ? ? Patient ID: Stephanie Delacruz, female    DOB: [birth year], 88 y.o.   MRN: [id number] ? ?This visit occurred during the SARS-CoV-2 public health emergency.  Safety protocols were in place, including screening questions prior to the visit, additional usage of staff PPE, and extensive cleaning of exam room while observing appropriate contact time as indicated for disinfecting solutions.  ? ?Patient here for a scheduled follow up.   ? ?Chief Complaint  ?Patient presents with  ? Follow-up  ?  36mo follow up - swelling in feet and ankles  ? .  ? ?HPI ?She is accompanied by her daughter.  History obtained from both of them.  Seeing Dr Tasia Catchings and is being treated for multiple myeloma.  Appears to be doing well.  Main complaint is feet and ankle swelling.  States has had issues with this previously (during hot months).  Better when cool.  Has been persistent - even in cooler weather.  Is better in am.  Worse as day progresses.  No chest pain.  No sob.  No cough or congestion.  Eating.  No nausea or vomiting.  Bowels moving.  Taking amlodipine.  Has been on this medication for years.  (Taking 2.$RemoveBe'5mg'FNuFfoSvn$  - 4/day).  Is not sure why she was prescribed this dosing of the medication.  No calf pain or cramping with walking.   ? ? ?Past Medical History:  ?Diagnosis Date  ? Anemia   ? GERD (gastroesophageal reflux disease)   ? Hypercholesterolemia   ? Hypertension   ? Renal insufficiency   ? ?Past Surgical History:  ?Procedure Laterality Date  ? TONSILLECTOMY    ? TUBAL LIGATION    ? ?Family History  ?Problem Relation Age of Onset  ? Hypertension Mother   ? Arthritis Mother   ? Hypertension Father   ? Stroke Father   ? Breast cancer Niece   ? ?Social History  ? ?Socioeconomic History  ? Marital status: Married  ?  Spouse name: Not on file  ? Number of children: Not on file  ? Years of education: Not on file  ? Highest education level: Not on file   ?Occupational History  ? Not on file  ?Tobacco Use  ? Smoking status: Never  ? Smokeless tobacco: Never  ?Substance and Sexual Activity  ? Alcohol use: No  ? Drug use: No  ? Sexual activity: Not on file  ?Other Topics Concern  ? Not on file  ?Social History Narrative  ? Not on file  ? ?Social Determinants of Health  ? ?Financial Resource Strain: Not on file  ?Food Insecurity: Not on file  ?Transportation Needs: Not on file  ?Physical Activity: Not on file  ?Stress: Not on file  ?Social Connections: Not on file  ? ? ? ?Review of Systems  ?Constitutional:  Negative for appetite change and unexpected weight change.  ?HENT:  Negative for congestion and sinus pressure.   ?Respiratory:  Negative for cough, chest tightness and shortness of breath.   ?Cardiovascular:  Negative for chest pain and palpitations.  ?     Lower extremity swelling as outlined.   ?Gastrointestinal:  Negative for abdominal pain, diarrhea, nausea and vomiting.  ?Genitourinary:  Negative for difficulty urinating and dysuria.  ?Musculoskeletal:  Negative for joint swelling and myalgias.  ?Skin:  Negative for color change and rash.  ?Neurological:  Negative for dizziness, light-headedness  and headaches.  ?Psychiatric/Behavioral:  Negative for agitation and dysphoric mood.   ? ?   ?Objective:  ?  ? ?BP 140/60 (BP Location: Left Arm, Patient Position: Sitting, Cuff Size: Small)   Pulse 60   Temp 98.2 ?F (36.8 ?C) (Temporal)   Resp 15   Ht $R'5\' 2"'pC$  (1.575 m)   Wt 140 lb (63.5 kg)   SpO2 96%   BMI 25.61 kg/m?  ?Wt Readings from Last 3 Encounters:  ?11/29/21 140 lb (63.5 kg)  ?11/22/21 140 lb (63.5 kg)  ?11/08/21 139 lb 5.3 oz (63.2 kg)  ? ? ?Physical Exam ?Vitals reviewed.  ?Constitutional:   ?   General: She is not in acute distress. ?   Appearance: Normal appearance.  ?HENT:  ?   Head: Normocephalic and atraumatic.  ?   Right Ear: External ear normal.  ?   Left Ear: External ear normal.  ?Eyes:  ?   General: No scleral icterus.    ?   Right eye: No  discharge.     ?   Left eye: No discharge.  ?   Conjunctiva/sclera: Conjunctivae normal.  ?Neck:  ?   Thyroid: No thyromegaly.  ?Cardiovascular:  ?   Rate and Rhythm: Normal rate and regular rhythm.  ?   Comments: Palpable DP pulse left.  Unable to locate DP or PT pulse - right.  ?Pulmonary:  ?   Effort: No respiratory distress.  ?   Breath sounds: Normal breath sounds. No wheezing.  ?Abdominal:  ?   General: Bowel sounds are normal.  ?   Palpations: Abdomen is soft.  ?   Tenderness: There is no abdominal tenderness.  ?Musculoskeletal:     ?   General: No tenderness.  ?   Cervical back: Neck supple. No tenderness.  ?   Comments: Increased pedal and ankle swelling.    ?Lymphadenopathy:  ?   Cervical: No cervical adenopathy.  ?Skin: ?   Findings: No erythema or rash.  ?Neurological:  ?   Mental Status: She is alert.  ?Psychiatric:     ?   Mood and Affect: Mood normal.     ?   Behavior: Behavior normal.  ? ? ? ?Outpatient Encounter Medications as of 11/29/2021  ?Medication Sig  ? acyclovir (ZOVIRAX) 400 MG tablet Take 1 tablet (400 mg total) by mouth 2 (two) times daily.  ? amLODipine (NORVASC) 2.5 MG tablet Take 10 mg by mouth daily.  ? ascorbic acid (VITAMIN C) 250 MG tablet daily Take 2 tablets every day by oral route.  ? ascorbic acid (VITAMIN C) 500 MG tablet Take 500 mg by mouth daily.  ? aspirin 81 MG EC tablet Take 81 mg by mouth daily.  ? Calcium Carb-Cholecalciferol (CALCIUM 500 + D PO) Take by mouth.  ? dexamethasone (DECADRON) 4 MG tablet Take 5 tablets (20 mg total) by mouth once a week.  ? donepezil (ARICEPT) 10 MG tablet Take 10 mg by mouth daily.  ? fenofibrate (TRICOR) 48 MG tablet Take 48 mg by mouth daily.  ? fexofenadine (ALLEGRA) 180 MG tablet Take by mouth.  ? hydrochlorothiazide (MICROZIDE) 12.5 MG capsule Take 12.5 mg by mouth daily.  ? lenalidomide (REVLIMID) 10 MG capsule Take 1 capsule (10 mg total) by mouth daily. Take for 21 days, then hold for 7 days. Repeat every 28 days.  ? losartan  (COZAAR) 100 MG tablet Take 100 mg by mouth daily.  ? magic mouthwash w/lidocaine SOLN Take 5 mLs by mouth 4 (  four) times daily as needed for mouth pain. Sig: Swish/Swallow 5-10 ml four times a day as needed. Dispense 480 ml. 1RF  ? memantine (NAMENDA) 5 MG tablet Take 5 mg by mouth 2 (two) times daily.  ? metoprolol succinate (TOPROL-XL) 25 MG 24 hr tablet Take 50 mg by mouth daily.  ? montelukast (SINGULAIR) 10 MG tablet Take 1 tablet (10 mg total) by mouth See admin instructions. Take 1 tablet one day before Daratumumab injection.  ? Multiple Vitamins-Minerals (WOMENS MULTIVITAMIN PO) Take 1 tablet by mouth daily.  ? omeprazole (PRILOSEC) 20 MG capsule TAKE 1 CAPSULE(20 MG) BY MOUTH DAILY  ? potassium chloride (KLOR-CON M) 10 MEQ tablet TAKE 1 TABLET(10 MEQ) BY MOUTH DAILY  ? prochlorperazine (COMPAZINE) 10 MG tablet Take 1 tablet (10 mg total) by mouth every 6 (six) hours as needed (Nausea or vomiting).  ? ?No facility-administered encounter medications on file as of 11/29/2021.  ?  ? ?Lab Results  ?Component Value Date  ? WBC 3.7 (L) 11/22/2021  ? HGB 9.7 (L) 11/22/2021  ? HCT 29.4 (L) 11/22/2021  ? PLT 264 11/22/2021  ? GLUCOSE 113 (H) 11/22/2021  ? CHOL 115 06/27/2021  ? TRIG 34 06/27/2021  ? HDL 51 06/27/2021  ? [place] 57 06/27/2021  ? ALT 16 11/22/2021  ? AST 22 11/22/2021  ? NA 135 11/22/2021  ? K 3.8 11/22/2021  ? CL 103 11/22/2021  ? CREATININE 1.17 (H) 11/22/2021  ? BUN 21 11/22/2021  ? CO2 24 11/22/2021  ? TSH 0.346 (L) 07/24/2021  ? ? ?NM PET Image Initial (PI) Whole Body ? ?Result Date: 08/28/2021 ?CLINICAL DATA:  Initial treatment strategy for multiple myeloma. EXAM: NUCLEAR MEDICINE PET WHOLE BODY TECHNIQUE: 7.88 mCi F-18 FDG was injected intravenously. Full-ring PET imaging was performed from the head to foot after the radiotracer. CT data was obtained and used for attenuation correction and anatomic localization. Fasting blood glucose: 79 mg/dl COMPARISON:  None. FINDINGS: Mediastinal blood pool  activity: SUV max 2.49 HEAD/NECK: No hypermetabolic activity in the scalp. No hypermetabolic cervical lymph nodes. Incidental CT findings: none CHEST: No tracer avid supraclavicular axillary, mediastinal, or hilar lymph nodes.

## 2021-11-30 ENCOUNTER — Encounter: Payer: Self-pay | Admitting: Internal Medicine

## 2021-11-30 DIAGNOSIS — R0989 Other specified symptoms and signs involving the circulatory and respiratory systems: Secondary | ICD-10-CM | POA: Insufficient documentation

## 2021-11-30 DIAGNOSIS — M7989 Other specified soft tissue disorders: Secondary | ICD-10-CM | POA: Insufficient documentation

## 2021-11-30 NOTE — Assessment & Plan Note (Signed)
Given unable to appreciate distal pulse - right - will have [facility] evaluate. ?

## 2021-11-30 NOTE — Assessment & Plan Note (Signed)
Currently being treated and followed by hematology.  They are following her counts.  ?

## 2021-11-30 NOTE — Assessment & Plan Note (Signed)
Blood pressure as outlined - outside readings averaging 130s/80s.  Continue toprol, amlodipine, microzide and losartan.  Continue current medication regimen.  Follow pressures.  Follow metabolic panel.   Taking amlodipine. Has been on this medication for years.  (Taking 2.'5mg'$  - 4/day).  Is not sure why she was prescribed this dosing of the medication. Will hold on making changes.  If persistent elevation, will require further adjustments in her medication.   ?

## 2021-11-30 NOTE — Assessment & Plan Note (Signed)
Lower extremity swelling as outlined.  Discussed may be multifactorial.  Discussed anemia, amlodipine and venous insufficiency.  She is trying to elevate her legs when sitting.  Eating.  Albumin 3.4.  Given unable to appreciate distal pulse - right - will have [facility] evaluate.  Continue leg elevation and avoidance of increased sodium.   ?

## 2021-11-30 NOTE — Assessment & Plan Note (Signed)
Avoid antiinflammatories.  Stay hydrated.  Follow renal function. Continue losartan.  ?

## 2021-11-30 NOTE — Assessment & Plan Note (Signed)
On tricor.  Check lipid panel.   

## 2021-11-30 NOTE — Assessment & Plan Note (Signed)
Being followed by hematology.  hgb stable.  

## 2021-12-02 ENCOUNTER — Encounter: Payer: Self-pay | Admitting: Oncology

## 2021-12-04 ENCOUNTER — Other Ambulatory Visit: Payer: Self-pay

## 2021-12-04 ENCOUNTER — Encounter: Payer: Self-pay | Admitting: Oncology

## 2021-12-04 MED ORDER — MAGIC MOUTHWASH W/LIDOCAINE: 5.0000 mL | Freq: Four times a day (QID) | ORAL | 1 refills | Status: DC | PRN

## 2021-12-04 MED ORDER — STERILE WATER FOR INJECTION IJ SOLN
OROMUCOSAL | 1 refills | Status: DC
  Filled 2021-12-04: qty 480, 12d supply, fill #0

## 2021-12-05 ENCOUNTER — Other Ambulatory Visit: Payer: Self-pay

## 2021-12-06 ENCOUNTER — Inpatient Hospital Stay: Payer: Medicare Other

## 2021-12-06 ENCOUNTER — Encounter: Payer: Self-pay | Admitting: Oncology

## 2021-12-06 ENCOUNTER — Other Ambulatory Visit: Payer: Self-pay

## 2021-12-06 ENCOUNTER — Inpatient Hospital Stay: Payer: Medicare Other | Attending: Oncology

## 2021-12-06 VITALS — BP 128/71 | HR 58 | Temp 97.8°F | Wt 140.8 lb

## 2021-12-06 DIAGNOSIS — N1831 Chronic kidney disease, stage 3a: Secondary | ICD-10-CM | POA: Diagnosis not present

## 2021-12-06 DIAGNOSIS — E7800 Pure hypercholesterolemia, unspecified: Secondary | ICD-10-CM | POA: Diagnosis not present

## 2021-12-06 DIAGNOSIS — Z7952 Long term (current) use of systemic steroids: Secondary | ICD-10-CM | POA: Diagnosis not present

## 2021-12-06 DIAGNOSIS — I129 Hypertensive chronic kidney disease with stage 1 through stage 4 chronic kidney disease, or unspecified chronic kidney disease: Secondary | ICD-10-CM | POA: Diagnosis not present

## 2021-12-06 DIAGNOSIS — Z7961 Long term (current) use of immunomodulator: Secondary | ICD-10-CM | POA: Diagnosis not present

## 2021-12-06 DIAGNOSIS — Z8249 Family history of ischemic heart disease and other diseases of the circulatory system: Secondary | ICD-10-CM | POA: Insufficient documentation

## 2021-12-06 DIAGNOSIS — E8809 Other disorders of plasma-protein metabolism, not elsewhere classified: Secondary | ICD-10-CM | POA: Insufficient documentation

## 2021-12-06 DIAGNOSIS — M899 Disorder of bone, unspecified: Secondary | ICD-10-CM | POA: Insufficient documentation

## 2021-12-06 DIAGNOSIS — R5383 Other fatigue: Secondary | ICD-10-CM | POA: Insufficient documentation

## 2021-12-06 DIAGNOSIS — Z5112 Encounter for antineoplastic immunotherapy: Secondary | ICD-10-CM | POA: Insufficient documentation

## 2021-12-06 DIAGNOSIS — E46 Unspecified protein-calorie malnutrition: Secondary | ICD-10-CM | POA: Insufficient documentation

## 2021-12-06 DIAGNOSIS — Z79899 Other long term (current) drug therapy: Secondary | ICD-10-CM | POA: Insufficient documentation

## 2021-12-06 DIAGNOSIS — M549 Dorsalgia, unspecified: Secondary | ICD-10-CM | POA: Diagnosis not present

## 2021-12-06 DIAGNOSIS — Z823 Family history of stroke: Secondary | ICD-10-CM | POA: Insufficient documentation

## 2021-12-06 DIAGNOSIS — Z803 Family history of malignant neoplasm of breast: Secondary | ICD-10-CM | POA: Insufficient documentation

## 2021-12-06 DIAGNOSIS — C9000 Multiple myeloma not having achieved remission: Secondary | ICD-10-CM | POA: Diagnosis not present

## 2021-12-06 DIAGNOSIS — D649 Anemia, unspecified: Secondary | ICD-10-CM | POA: Insufficient documentation

## 2021-12-06 DIAGNOSIS — Z8261 Family history of arthritis: Secondary | ICD-10-CM | POA: Insufficient documentation

## 2021-12-06 LAB — CBC WITH DIFFERENTIAL/PLATELET
Abs Immature Granulocytes: 0.03 10*3/uL (ref 0.00–0.07)
Basophils Absolute: 0.1 10*3/uL (ref 0.0–0.1)
Basophils Relative: 2 %
Eosinophils Absolute: 0.3 10*3/uL (ref 0.0–0.5)
Eosinophils Relative: 5 %
HCT: 28.7 % — ABNORMAL LOW (ref 36.0–46.0)
Hemoglobin: 9.2 g/dL — ABNORMAL LOW (ref 12.0–15.0)
Immature Granulocytes: 1 %
Lymphocytes Relative: 12 %
Lymphs Abs: 0.6 10*3/uL — ABNORMAL LOW (ref 0.7–4.0)
MCH: 31.8 pg (ref 26.0–34.0)
MCHC: 32.1 g/dL (ref 30.0–36.0)
MCV: 99.3 fL (ref 80.0–100.0)
Monocytes Absolute: 0.5 10*3/uL (ref 0.1–1.0)
Monocytes Relative: 10 %
Neutro Abs: 3.6 10*3/uL (ref 1.7–7.7)
Neutrophils Relative %: 70 %
Platelets: 197 10*3/uL (ref 150–400)
RBC: 2.89 MIL/uL — ABNORMAL LOW (ref 3.87–5.11)
RDW: 15.1 % (ref 11.5–15.5)
WBC: 5.1 10*3/uL (ref 4.0–10.5)
nRBC: 0 % (ref 0.0–0.2)

## 2021-12-06 LAB — COMPREHENSIVE METABOLIC PANEL
ALT: 16 U/L (ref 0–44)
AST: 21 U/L (ref 15–41)
Albumin: 3.3 g/dL — ABNORMAL LOW (ref 3.5–5.0)
Alkaline Phosphatase: 49 U/L (ref 38–126)
Anion gap: 10 (ref 5–15)
BUN: 18 mg/dL (ref 8–23)
CO2: 23 mmol/L (ref 22–32)
Calcium: 8.9 mg/dL (ref 8.9–10.3)
Chloride: 107 mmol/L (ref 98–111)
Creatinine, Ser: 1.11 mg/dL — ABNORMAL HIGH (ref 0.44–1.00)
GFR, Estimated: 49 mL/min — ABNORMAL LOW (ref >60)
Glucose, Bld: 95 mg/dL (ref 70–99)
Potassium: 4 mmol/L (ref 3.5–5.1)
Sodium: 140 mmol/L (ref 135–145)
Total Bilirubin: 0.4 mg/dL (ref 0.3–1.2)
Total Protein: 6.9 g/dL (ref 6.5–8.1)

## 2021-12-06 MED ORDER — ACETAMINOPHEN 325 MG PO TABS
650.0000 mg | ORAL_TABLET | Freq: Once | ORAL | Status: AC
  Administered 2021-12-06: 650 mg via ORAL
  Filled 2021-12-06: qty 2

## 2021-12-06 MED ORDER — DARATUMUMAB-HYALURONIDASE-FIHJ 1800-30000 MG-UT/15ML ~~LOC~~ SOLN
1800.0000 mg | Freq: Once | SUBCUTANEOUS | Status: AC
  Administered 2021-12-06: 1800 mg via SUBCUTANEOUS
  Filled 2021-12-06: qty 15

## 2021-12-06 MED ORDER — DIPHENHYDRAMINE HCL 25 MG PO CAPS
50.0000 mg | ORAL_CAPSULE | Freq: Once | ORAL | Status: AC
  Administered 2021-12-06: 50 mg via ORAL
  Filled 2021-12-06: qty 2

## 2021-12-06 NOTE — Patient Instructions (Signed)
[facility] AT [place]  Discharge Instructions: Thank you for choosing [facility] to provide your oncology and hematology care.  If you have a lab appointment with the [facility], please go directly to the [facility] and check in at the registration area.  Wear comfortable clothing and clothing appropriate for easy access to any Portacath or PICC line.   We strive to give you quality time with your provider. You may need to reschedule your appointment if you arrive late (15 or more minutes).  Arriving late affects you and other patients whose appointments are after yours.  Also, if you miss three or more appointments without notifying the office, you may be dismissed from the clinic at the provider's discretion.      For prescription refill requests, have your pharmacy contact our office and allow 72 hours for refills to be completed.    Today you received the following chemotherapy and/or immunotherapy agents: Darzalex Faspro      To help prevent nausea and vomiting after your treatment, we encourage you to take your nausea medication as directed.  BELOW ARE SYMPTOMS THAT SHOULD BE REPORTED IMMEDIATELY: *FEVER GREATER THAN 100.4 F (38 C) OR HIGHER *CHILLS OR SWEATING *NAUSEA AND VOMITING THAT IS NOT CONTROLLED WITH YOUR NAUSEA MEDICATION *UNUSUAL SHORTNESS OF BREATH *UNUSUAL BRUISING OR BLEEDING *URINARY PROBLEMS (pain or burning when urinating, or frequent urination) *BOWEL PROBLEMS (unusual diarrhea, constipation, pain near the anus) TENDERNESS IN MOUTH AND THROAT WITH OR WITHOUT PRESENCE OF ULCERS (sore throat, sores in mouth, or a toothache) UNUSUAL RASH, SWELLING OR PAIN  UNUSUAL VAGINAL DISCHARGE OR ITCHING   Items with * indicate a potential emergency and should be followed up as soon as possible or go to the Emergency Department if any problems should occur.  Please show the CHEMOTHERAPY ALERT CARD or IMMUNOTHERAPY ALERT CARD at  check-in to the Emergency Department and triage nurse.  Should you have questions after your visit or need to cancel or reschedule your appointment, please contact [facility] [place] AT [place]  [phone] and follow the prompts.  Office hours are 8:00 a.m. to 4:30 p.m. Monday - Friday. Please note that voicemails left after 4:00 p.m. may not be returned until the following business day.  We are closed weekends and major holidays. You have access to a nurse at all times for urgent questions. Please call the main number to the clinic [phone] and follow the prompts.  For any non-urgent questions, you may also contact your provider using MyChart. We now offer e-Visits for anyone 62 and older to request care online for non-urgent symptoms. For details visit [email or web address].   Also download the MyChart app! Go to the app store, search "MyChart", open the app, select [facility], and log in with your MyChart username and password.  Due to Covid, a mask is required upon entering the hospital/clinic. If you do not have a mask, one will be given to you upon arrival. For doctor visits, patients may have 1 support person aged 16 or older with them. For treatment visits, patients cannot have anyone with them due to current Covid guidelines and our immunocompromised population.

## 2021-12-09 LAB — KAPPA/LAMBDA LIGHT CHAINS
Kappa free light chain: 20.2 mg/L — ABNORMAL HIGH (ref 3.3–19.4)
Kappa, lambda light chain ratio: 2.43 — ABNORMAL HIGH (ref 0.26–1.65)
Lambda free light chains: 8.3 mg/L (ref 5.7–26.3)

## 2021-12-11 LAB — MULTIPLE MYELOMA PANEL, SERUM
Albumin SerPl Elph-Mcnc: 3.5 g/dL (ref 2.9–4.4)
Albumin/Glob SerPl: 1.2 (ref 0.7–1.7)
Alpha 1: 0.2 g/dL (ref 0.0–0.4)
Alpha2 Glob SerPl Elph-Mcnc: 0.7 g/dL (ref 0.4–1.0)
B-Globulin SerPl Elph-Mcnc: 1.7 g/dL — ABNORMAL HIGH (ref 0.7–1.3)
Gamma Glob SerPl Elph-Mcnc: 0.4 g/dL (ref 0.4–1.8)
Globulin, Total: 3 g/dL (ref 2.2–3.9)
IgA: 1125 mg/dL — ABNORMAL HIGH (ref 64–422)
IgG (Immunoglobin G), Serum: 456 mg/dL — ABNORMAL LOW (ref 586–1602)
IgM (Immunoglobulin M), Srm: 12 mg/dL — ABNORMAL LOW (ref 26–217)
M Protein SerPl Elph-Mcnc: 1 g/dL — ABNORMAL HIGH
Total Protein ELP: 6.5 g/dL (ref 6.0–8.5)

## 2021-12-13 ENCOUNTER — Other Ambulatory Visit: Payer: Self-pay | Admitting: *Deleted

## 2021-12-13 DIAGNOSIS — C9000 Multiple myeloma not having achieved remission: Secondary | ICD-10-CM

## 2021-12-17 ENCOUNTER — Encounter: Payer: Self-pay | Admitting: Oncology

## 2021-12-17 MED ORDER — LENALIDOMIDE 10 MG PO CAPS: 10.0000 mg | ORAL_CAPSULE | Freq: Every day | ORAL | 0 refills | Status: DC

## 2021-12-20 ENCOUNTER — Encounter: Payer: Self-pay | Admitting: Oncology

## 2021-12-20 ENCOUNTER — Inpatient Hospital Stay: Payer: Medicare Other

## 2021-12-20 ENCOUNTER — Inpatient Hospital Stay (HOSPITAL_BASED_OUTPATIENT_CLINIC_OR_DEPARTMENT_OTHER): Payer: Medicare Other | Admitting: Oncology

## 2021-12-20 VITALS — BP 137/54 | HR 60 | Temp 98.3°F | Resp 18 | Wt 140.1 lb

## 2021-12-20 DIAGNOSIS — Z7952 Long term (current) use of systemic steroids: Secondary | ICD-10-CM | POA: Diagnosis not present

## 2021-12-20 DIAGNOSIS — M549 Dorsalgia, unspecified: Secondary | ICD-10-CM | POA: Diagnosis not present

## 2021-12-20 DIAGNOSIS — Z823 Family history of stroke: Secondary | ICD-10-CM | POA: Diagnosis not present

## 2021-12-20 DIAGNOSIS — Z5111 Encounter for antineoplastic chemotherapy: Secondary | ICD-10-CM

## 2021-12-20 DIAGNOSIS — C9000 Multiple myeloma not having achieved remission: Secondary | ICD-10-CM

## 2021-12-20 DIAGNOSIS — M899 Disorder of bone, unspecified: Secondary | ICD-10-CM

## 2021-12-20 DIAGNOSIS — D649 Anemia, unspecified: Secondary | ICD-10-CM

## 2021-12-20 DIAGNOSIS — E46 Unspecified protein-calorie malnutrition: Secondary | ICD-10-CM | POA: Diagnosis not present

## 2021-12-20 DIAGNOSIS — E7800 Pure hypercholesterolemia, unspecified: Secondary | ICD-10-CM | POA: Diagnosis not present

## 2021-12-20 DIAGNOSIS — Z5112 Encounter for antineoplastic immunotherapy: Secondary | ICD-10-CM | POA: Diagnosis not present

## 2021-12-20 DIAGNOSIS — Z79899 Other long term (current) drug therapy: Secondary | ICD-10-CM | POA: Diagnosis not present

## 2021-12-20 DIAGNOSIS — Z803 Family history of malignant neoplasm of breast: Secondary | ICD-10-CM | POA: Diagnosis not present

## 2021-12-20 DIAGNOSIS — R5383 Other fatigue: Secondary | ICD-10-CM | POA: Diagnosis not present

## 2021-12-20 DIAGNOSIS — Z7961 Long term (current) use of immunomodulator: Secondary | ICD-10-CM | POA: Diagnosis not present

## 2021-12-20 DIAGNOSIS — I129 Hypertensive chronic kidney disease with stage 1 through stage 4 chronic kidney disease, or unspecified chronic kidney disease: Secondary | ICD-10-CM | POA: Diagnosis not present

## 2021-12-20 DIAGNOSIS — N1831 Chronic kidney disease, stage 3a: Secondary | ICD-10-CM

## 2021-12-20 DIAGNOSIS — E8809 Other disorders of plasma-protein metabolism, not elsewhere classified: Secondary | ICD-10-CM | POA: Diagnosis not present

## 2021-12-20 LAB — COMPREHENSIVE METABOLIC PANEL
ALT: 20 U/L (ref 0–44)
AST: 22 U/L (ref 15–41)
Albumin: 3.4 g/dL — ABNORMAL LOW (ref 3.5–5.0)
Alkaline Phosphatase: 55 U/L (ref 38–126)
Anion gap: 8 (ref 5–15)
BUN: 17 mg/dL (ref 8–23)
CO2: 25 mmol/L (ref 22–32)
Calcium: 9 mg/dL (ref 8.9–10.3)
Chloride: 106 mmol/L (ref 98–111)
Creatinine, Ser: 1 mg/dL (ref 0.44–1.00)
GFR, Estimated: 55 mL/min — ABNORMAL LOW (ref >60)
Glucose, Bld: 91 mg/dL (ref 70–99)
Potassium: 3.7 mmol/L (ref 3.5–5.1)
Sodium: 139 mmol/L (ref 135–145)
Total Bilirubin: 0.6 mg/dL (ref 0.3–1.2)
Total Protein: 6.7 g/dL (ref 6.5–8.1)

## 2021-12-20 LAB — CBC WITH DIFFERENTIAL/PLATELET
Abs Immature Granulocytes: 0.01 10*3/uL (ref 0.00–0.07)
Basophils Absolute: 0.1 10*3/uL (ref 0.0–0.1)
Basophils Relative: 2 %
Eosinophils Absolute: 0.3 10*3/uL (ref 0.0–0.5)
Eosinophils Relative: 6 %
HCT: 31.2 % — ABNORMAL LOW (ref 36.0–46.0)
Hemoglobin: 10.1 g/dL — ABNORMAL LOW (ref 12.0–15.0)
Immature Granulocytes: 0 %
Lymphocytes Relative: 14 %
Lymphs Abs: 0.6 10*3/uL — ABNORMAL LOW (ref 0.7–4.0)
MCH: 32.4 pg (ref 26.0–34.0)
MCHC: 32.4 g/dL (ref 30.0–36.0)
MCV: 100 fL (ref 80.0–100.0)
Monocytes Absolute: 0.3 10*3/uL (ref 0.1–1.0)
Monocytes Relative: 8 %
Neutro Abs: 3.1 10*3/uL (ref 1.7–7.7)
Neutrophils Relative %: 70 %
Platelets: 227 10*3/uL (ref 150–400)
RBC: 3.12 MIL/uL — ABNORMAL LOW (ref 3.87–5.11)
RDW: 14.8 % (ref 11.5–15.5)
WBC: 4.4 10*3/uL (ref 4.0–10.5)
nRBC: 0 % (ref 0.0–0.2)

## 2021-12-20 MED ORDER — ACETAMINOPHEN 325 MG PO TABS
650.0000 mg | ORAL_TABLET | Freq: Once | ORAL | Status: AC
  Administered 2021-12-20: 650 mg via ORAL
  Filled 2021-12-20: qty 2

## 2021-12-20 MED ORDER — ZOLEDRONIC ACID 4 MG/5ML IV CONC
3.3000 mg | Freq: Once | INTRAVENOUS | Status: AC
  Administered 2021-12-20: 3.3 mg via INTRAVENOUS
  Filled 2021-12-20: qty 4.13

## 2021-12-20 MED ORDER — DARATUMUMAB-HYALURONIDASE-FIHJ 1800-30000 MG-UT/15ML ~~LOC~~ SOLN
1800.0000 mg | Freq: Once | SUBCUTANEOUS | Status: AC
  Administered 2021-12-20: 1800 mg via SUBCUTANEOUS
  Filled 2021-12-20: qty 15

## 2021-12-20 MED ORDER — SODIUM CHLORIDE 0.9 % IV SOLN
Freq: Once | INTRAVENOUS | Status: AC
  Filled 2021-12-20: qty 250

## 2021-12-20 MED ORDER — DIPHENHYDRAMINE HCL 25 MG PO CAPS
50.0000 mg | ORAL_CAPSULE | Freq: Once | ORAL | Status: AC
  Administered 2021-12-20: 50 mg via ORAL
  Filled 2021-12-20: qty 2

## 2021-12-20 NOTE — Patient Instructions (Signed)
[facility] AT [place]  Discharge Instructions: Thank you for choosing [facility] to provide your oncology and hematology care.  If you have a lab appointment with the [facility], please go directly to the [facility] and check in at the registration area.  Wear comfortable clothing and clothing appropriate for easy access to any Portacath or PICC line.   We strive to give you quality time with your provider. You may need to reschedule your appointment if you arrive late (15 or more minutes).  Arriving late affects you and other patients whose appointments are after yours.  Also, if you miss three or more appointments without notifying the office, you may be dismissed from the clinic at the provider's discretion.      For prescription refill requests, have your pharmacy contact our office and allow 72 hours for refills to be completed.    Today you received the following chemotherapy and/or immunotherapy agents ZOMETA and DARAZALEX      To help prevent nausea and vomiting after your treatment, we encourage you to take your nausea medication as directed.  BELOW ARE SYMPTOMS THAT SHOULD BE REPORTED IMMEDIATELY: *FEVER GREATER THAN 100.4 F (38 C) OR HIGHER *CHILLS OR SWEATING *NAUSEA AND VOMITING THAT IS NOT CONTROLLED WITH YOUR NAUSEA MEDICATION *UNUSUAL SHORTNESS OF BREATH *UNUSUAL BRUISING OR BLEEDING *URINARY PROBLEMS (pain or burning when urinating, or frequent urination) *BOWEL PROBLEMS (unusual diarrhea, constipation, pain near the anus) TENDERNESS IN MOUTH AND THROAT WITH OR WITHOUT PRESENCE OF ULCERS (sore throat, sores in mouth, or a toothache) UNUSUAL RASH, SWELLING OR PAIN  UNUSUAL VAGINAL DISCHARGE OR ITCHING   Items with * indicate a potential emergency and should be followed up as soon as possible or go to the Emergency Department if any problems should occur.  Please show the CHEMOTHERAPY ALERT CARD or IMMUNOTHERAPY ALERT CARD at  check-in to the Emergency Department and triage nurse.  Should you have questions after your visit or need to cancel or reschedule your appointment, please contact [facility] CANCER [place] AT [place]  [phone] and follow the prompts.  Office hours are 8:00 a.m. to 4:30 p.m. Monday - Friday. Please note that voicemails left after 4:00 p.m. may not be returned until the following business day.  We are closed weekends and major holidays. You have access to a nurse at all times for urgent questions. Please call the main number to the clinic [phone] and follow the prompts.  For any non-urgent questions, you may also contact your provider using MyChart. We now offer e-Visits for anyone 81 and older to request care online for non-urgent symptoms. For details visit [email or web address].   Also download the MyChart app! Go to the app store, search "MyChart", open the app, select , and log in with your MyChart username and password.  Due to Covid, a mask is required upon entering the hospital/clinic. If you do not have a mask, one will be given to you upon arrival. For doctor visits, patients may have 1 support person aged 40 or older with them. For treatment visits, patients cannot have   Zoledronic Acid Injection (Cancer) What is this medication? ZOLEDRONIC ACID (ZOE le dron ik AS id) treats high calcium levels in the blood caused by cancer. It may also be used with chemotherapy to treat weakened bones caused by cancer. It works by slowing down the release of calcium from bones. This lowers calcium levels in your blood. It also makes your bones stronger and  less likely to break (fracture). It belongs to a group of medications called bisphosphonates. This medicine may be used for other purposes; ask your health care provider or pharmacist if you have questions. COMMON BRAND NAME(S): Zometa, Zometa Powder What should I tell my care team before I take this medication? They need  to know if you have any of these conditions: Dehydration Dental disease Kidney disease Liver disease Low levels of calcium in the blood Lung or breathing disease, such as asthma Receiving steroids, such as dexamethasone or prednisone An unusual or allergic reaction to zoledronic acid, other medications, foods, dyes, or preservatives Pregnant or trying to get pregnant Breast-feeding How should I use this medication? This medication is injected into a vein. It is given by your care team in a hospital or clinic setting. Talk to your care team about the use of this medication in children. Special care may be needed. Overdosage: If you think you have taken too much of this medicine contact a poison control center or emergency room at once. NOTE: This medicine is only for you. Do not share this medicine with others. What if I miss a dose? Keep appointments for follow-up doses. It is important not to miss your dose. Call your care team if you are unable to keep an appointment. What may interact with this medication? Certain antibiotics given by injection Diuretics, such as bumetanide, furosemide NSAIDs, medications for pain and inflammation, such as ibuprofen or naproxen Teriparatide Thalidomide This list may not describe all possible interactions. Give your health care provider a list of all the medicines, herbs, non-prescription drugs, or dietary supplements you use. Also tell them if you smoke, drink alcohol, or use illegal drugs. Some items may interact with your medicine. What should I watch for while using this medication? Visit your care team for regular checks on your progress. It may be some time before you see the benefit from this medication. Some people who take this medication have severe bone, joint, or muscle pain. This medication may also increase your risk for jaw problems or a broken thigh bone. Tell your care team right away if you have severe pain in your jaw, bones, joints,  or muscles. Tell you care team if you have any pain that does not go away or that gets worse. Tell your dentist and dental surgeon that you are taking this medication. You should not have major dental surgery while on this medication. See your dentist to have a dental exam and fix any dental problems before starting this medication. Take good care of your teeth while on this medication. Make sure you see your dentist for regular follow-up appointments. You should make sure you get enough calcium and vitamin D while you are taking this medication. Discuss the foods you eat and the vitamins you take with your care team. Check with your care team if you have severe diarrhea, nausea, and vomiting, or if you sweat a lot. The loss of too much body fluid may make it dangerous for you to take this medication. You may need bloodwork while taking this medication. Talk to your care team if you wish to become pregnant or think you might be pregnant. This medication can cause serious birth defects. What side effects may I notice from receiving this medication? Side effects that you should report to your care team as soon as possible: Allergic reactions--skin rash, itching, hives, swelling of the face, lips, tongue, or throat Kidney injury--decrease in the amount of urine, swelling of  the ankles, hands, or feet Low calcium level--muscle pain or cramps, confusion, tingling, or numbness in the hands or feet Osteonecrosis of the jaw--pain, swelling, or redness in the mouth, numbness of the jaw, poor healing after dental work, unusual discharge from the mouth, visible bones in the mouth Severe bone, joint, or muscle pain Side effects that usually do not require medical attention (report to your care team if they continue or are bothersome): Constipation Fatigue Fever Loss of appetite Nausea Stomach pain This list may not describe all possible side effects. Call your doctor for medical advice about side effects.  You may report side effects to FDA at 1-800-FDA-1088. Where should I keep my medication? This medication is given in a hospital or clinic. It will not be stored at home. NOTE: This sheet is a summary. It may not cover all possible information. If you have questions about this medicine, talk to your doctor, pharmacist, or health care provider.  2023 Elsevier/Gold Standard (2021-09-06 00:00:00)  Daratumumab; Hyaluronidase Injection What is this medication? DARATUMUMAB; HYALURONIDASE (dar a toom ue mab / hye al ur ON i dase) is a monoclonal antibody. Hyaluronidase is used to improve the effects of daratumumab. It treats certain types of cancer. Some of the cancers treated are multiple myeloma and light-chain amyloidosis. This medicine may be used for other purposes; ask your health care provider or pharmacist if you have questions. COMMON BRAND NAME(S): DARZALEX FASPRO What should I tell my care team before I take this medication? They need to know if you have any of these conditions: heart disease infection especially a viral infection such as chickenpox, cold sores, herpes, or hepatitis B lung or breathing disease an unusual or allergic reaction to daratumumab, hyaluronidase, other medicines, foods, dyes, or preservatives pregnant or trying to get pregnant breast-feeding How should I use this medication? This medicine is for injection under the skin. It is given by a health care professional in a hospital or clinic setting. Talk to your pediatrician regarding the use of this medicine in children. Special care may be needed. Overdosage: If you think you have taken too much of this medicine contact a poison control center or emergency room at once. NOTE: This medicine is only for you. Do not share this medicine with others. What if I miss a dose? Keep appointments for follow-up doses as directed. It is important not to miss your dose. Call your doctor or health care professional if you are  unable to keep an appointment. What may interact with this medication? Interactions have not been studied. This list may not describe all possible interactions. Give your health care provider a list of all the medicines, herbs, non-prescription drugs, or dietary supplements you use. Also tell them if you smoke, drink alcohol, or use illegal drugs. Some items may interact with your medicine. What should I watch for while using this medication? Your condition will be monitored carefully while you are receiving this medicine. This medicine can cause serious allergic reactions. To reduce your risk, your health care provider may give you other medicine to take before receiving this one. Be sure to follow the directions from your health care provider. This medicine can affect the results of blood tests to match your blood type. These changes can last for up to 6 months after the final dose. Your healthcare provider will do blood tests to match your blood type before you start treatment. Tell all of your healthcare providers that you are being treated with this medicine before  receiving a blood transfusion. This medicine can affect the results of some tests used to determine treatment response; extra tests may be needed to evaluate response. Do not become pregnant while taking this medicine or for 3 months after stopping it. Women should inform their health care provider if they wish to become pregnant or think they might be pregnant. There is a potential for serious side effects to an unborn child. Talk to your health care provider for more information. Do not breast-feed an infant while taking this medicine. What side effects may I notice from receiving this medication? Side effects that you should report to your care team as soon as possible: Allergic reactions--skin rash, itching or hives, swelling of the face, lips, or tongue Blood clot--chest pain, shortness of breath, pain, swelling or warmth in the  leg Blurred vision Fast, irregular heartbeat Infection--fever, chills, cough, sore throat, pain or trouble passing urine Injection reactions--dizziness, fast heartbeat, feeling faint or lightheaded, falls, headache, increase in blood pressure, nausea, vomiting, or wheezing or trouble breathing with loud or whistling sounds Low red blood cell counts--trouble breathing, feeling faint, lightheaded or falls, unusually weak or tired Unusual bleeding or bruising Side effects that usually do not require medical attention (report these to your care team if they continue or are bothersome): Back pain Constipation Diarrhea Pain, tingling, numbness in the hands or feet Pain, redness, or irritation at site where injected Muscle cramp or pain Swelling of the ankles, feet, hands Tiredness Trouble sleeping This list may not describe all possible side effects. Call your doctor for medical advice about side effects. You may report side effects to FDA at 1-800-FDA-1088. Where should I keep my medication? This drug is given in a hospital or clinic and will not be stored at home. NOTE: This sheet is a summary. It may not cover all possible information. If you have questions about this medicine, talk to your doctor, pharmacist, or health care provider.  2023 Elsevier/Gold Standard (2021-06-14 00:00:00)

## 2021-12-20 NOTE — Progress Notes (Signed)
Hematology/Oncology Progress note [phone] [phone]         Patient Care Team: Einar Pheasant, MD as PCP - General (Internal Medicine)  REFERRING PROVIDER: Einar Pheasant, MD  CHIEF COMPLAINTS/REASON FOR VISIT:  Follow-up for multiple myeloma  HISTORY OF PRESENTING ILLNESS:   Stephanie Delacruz is a  88 y.o.  female with PMH listed below was seen in consultation at the request of  Einar Pheasant, MD  for evaluation of anemia 07/15/2021, patient had CBC checked at the primary care provider's office.  CBC showed hemoglobin 7.7, hematocrit 23.9, white count 3.6, RDW 16.5.  Platelet count 166. Reviewed previous lab records.  Patient has chronic anemia dating back at least 2016. Her baseline prior to 2018 was about 10.   Hemoglobin was 9.1 in March 2022, and further dropped to 8.1 in October 2022, then November dropped to 7.7. Patient denies being significantly affected, tired, lightheadedness, shortness of breath.  Appetite is fair.  Denies any black or bloody stool.   Denies any history of hemoglobinopathy  08/26/2021 bone marrow biopsy showed hypercellular marrow with plasma cell neoplasm.  67% plasma cells in the aspirate.  Cytogenetics showed complex hyperplasia, gain of 1 q.  # 08/26/2021 started on Daratumumab+ dexamethasone # 09/06/2021 added Revlimid 42m x 14 days.  #09/27/2021, cycle 2 Revlimid 10 mg for 21 days.  INTERVAL HISTORY Stephanie L WWilfordis a 88y.o. female who has above history reviewed by me today presents for follow up visit for management of multiple myeloma Patient tolerates daratumumab -RD regimen well. She was accompanied by her daughter today.  She denies any nausea vomiting diarrhea.  Appetite is good.  Her weight has been stable.   Review of Systems  Constitutional:  Positive for fatigue. Negative for appetite change, chills and fever.  HENT:   Negative for hearing loss and voice change.   Eyes:  Negative for eye problems.   Respiratory:  Negative for chest tightness and cough.   Cardiovascular:  Negative for chest pain.  Gastrointestinal:  Negative for abdominal distention, abdominal pain and blood in stool.  Endocrine: Negative for hot flashes.  Genitourinary:  Negative for difficulty urinating and frequency.   Musculoskeletal:  Positive for back pain. Negative for arthralgias.  Skin:  Negative for itching and rash.  Neurological:  Negative for extremity weakness.  Hematological:  Negative for adenopathy.  Psychiatric/Behavioral:  Negative for confusion.    MEDICAL HISTORY:  Past Medical History:  Diagnosis Date   Anemia    GERD (gastroesophageal reflux disease)    Hypercholesterolemia    Hypertension    Renal insufficiency     SURGICAL HISTORY: Past Surgical History:  Procedure Laterality Date   TONSILLECTOMY     TUBAL LIGATION      SOCIAL HISTORY: Social History   Socioeconomic History   Marital status: Married    Spouse name: Not on file   Number of children: Not on file   Years of education: Not on file   Highest education level: Not on file  Occupational History   Not on file  Tobacco Use   Smoking status: Never   Smokeless tobacco: Never  Substance and Sexual Activity   Alcohol use: No   Drug use: No   Sexual activity: Not on file  Other Topics Concern   Not on file  Social History Narrative   Not on file   Social Determinants of Health   Financial Resource Strain: Not on file  Food Insecurity: Not on file  Transportation Needs: Not on file  Physical Activity: Not on file  Stress: Not on file  Social Connections: Not on file  Intimate Partner Violence: Not on file    FAMILY HISTORY: Family History  Problem Relation Age of Onset   Hypertension Mother    Arthritis Mother    Hypertension Father    Stroke Father    Breast cancer Niece     ALLERGIES:  has No Known Allergies.  MEDICATIONS:  Current Outpatient Medications  Medication Sig Dispense Refill    acyclovir (ZOVIRAX) 400 MG tablet Take 1 tablet (400 mg total) by mouth 2 (two) times daily. 60 tablet 11   amLODipine (NORVASC) 2.5 MG tablet Take 10 mg by mouth daily.     ascorbic acid (VITAMIN C) 250 MG tablet daily Take 2 tablets every day by oral route.     ascorbic acid (VITAMIN C) 500 MG tablet Take 500 mg by mouth daily.     aspirin 81 MG EC tablet Take 81 mg by mouth daily.     Calcium Carb-Cholecalciferol (CALCIUM 500 + D PO) Take by mouth.     dexamethasone (DECADRON) 4 MG tablet Take 5 tablets (20 mg total) by mouth once a week. 60 tablet 0   donepezil (ARICEPT) 10 MG tablet Take 10 mg by mouth daily.     fenofibrate (TRICOR) 48 MG tablet Take 48 mg by mouth daily.     fexofenadine (ALLEGRA) 180 MG tablet Take by mouth.     hydrochlorothiazide (MICROZIDE) 12.5 MG capsule Take 12.5 mg by mouth daily.     lenalidomide (REVLIMID) 10 MG capsule Take 1 capsule (10 mg total) by mouth daily. Take for 21 days, then hold for 7 days. Repeat every 28 days. 21 capsule 0   losartan (COZAAR) 100 MG tablet Take 100 mg by mouth daily.     magic mouthwash (multi-ingredient) oral suspension Swish and swallow 5-26m four times a day as needed for mouth pain 480 mL 1   magic mouthwash w/lidocaine SOLN Take 5 mLs by mouth 4 (four) times daily as needed for mouth pain. Sig: Swish/Swallow 5-10 ml four times a day as needed. Dispense 480 ml. 1RF 480 mL 1   memantine (NAMENDA) 5 MG tablet Take 5 mg by mouth 2 (two) times daily.     metoprolol succinate (TOPROL-XL) 25 MG 24 hr tablet Take 50 mg by mouth daily.     montelukast (SINGULAIR) 10 MG tablet Take 1 tablet (10 mg total) by mouth See admin instructions. Take 1 tablet one day before Daratumumab injection. 60 tablet 0   Multiple Vitamins-Minerals (WOMENS MULTIVITAMIN PO) Take 1 tablet by mouth daily.     omeprazole (PRILOSEC) 20 MG capsule TAKE 1 CAPSULE(20 MG) BY MOUTH DAILY 90 capsule 0   prochlorperazine (COMPAZINE) 10 MG tablet Take 1 tablet (10 mg  total) by mouth every 6 (six) hours as needed (Nausea or vomiting). 30 tablet 1   potassium chloride (KLOR-CON M) 10 MEQ tablet TAKE 1 TABLET(10 MEQ) BY MOUTH DAILY (Patient not taking: Reported on 12/20/2021) 30 tablet 2   No current facility-administered medications for this visit.     PHYSICAL EXAMINATION: ECOG PERFORMANCE STATUS: 1 - Symptomatic but completely ambulatory Vitals:   12/20/21 0832  BP: (!) 137/54  Pulse: 60  Resp: 18  Temp: 98.3 F (36.8 C)   Filed Weights   12/20/21 0832  Weight: 140 lb 1.6 oz (63.5 kg)    Physical Exam Constitutional:      General:  She is not in acute distress. HENT:     Head: Normocephalic and atraumatic.  Eyes:     General: No scleral icterus. Cardiovascular:     Rate and Rhythm: Normal rate and regular rhythm.     Heart sounds: Normal heart sounds.  Pulmonary:     Effort: Pulmonary effort is normal. No respiratory distress.     Breath sounds: No wheezing.  Abdominal:     General: Bowel sounds are normal. There is no distension.     Palpations: Abdomen is soft.  Musculoskeletal:        General: No deformity. Normal range of motion.     Cervical back: Normal range of motion and neck supple.  Skin:    General: Skin is warm and dry.     Findings: No erythema or rash.  Neurological:     Mental Status: She is alert and oriented to person, place, and time. Mental status is at baseline.     Cranial Nerves: No cranial nerve deficit.     Coordination: Coordination normal.  Psychiatric:        Mood and Affect: Mood normal.    LABORATORY DATA:  I have reviewed the data as listed Lab Results  Component Value Date   WBC 4.4 12/20/2021   HGB 10.1 (L) 12/20/2021   HCT 31.2 (L) 12/20/2021   MCV 100.0 12/20/2021   PLT 227 12/20/2021   Recent Labs    06/27/21 0940 07/15/21 1117 11/22/21 0924 12/06/21 0903 12/20/21 0808  NA 142   < > 135 140 139  K 3.8   < > 3.8 4.0 3.7  CL 106   < > 103 107 106  CO2 25   < > '24 23 25   ' GLUCOSE 90   < > 113* 95 91  BUN 19   < > '21 18 17  ' CREATININE 1.20*   < > 1.17* 1.11* 1.00  CALCIUM 10.1   < > 9.2 8.9 9.0  GFRNONAA 44*   < > 46* 49* 55*  PROT 9.8*   < > 7.6 6.9 6.7  ALBUMIN 2.8*   < > 3.4* 3.3* 3.4*  AST 20   < > '22 21 22  ' ALT 13   < > '16 16 20  ' ALKPHOS 39   < > 60 49 55  BILITOT 0.5   < > 0.5 0.4 0.6  BILIDIR <0.1  --   --   --   --   IBILI NOT CALCULATED  --   --   --   --    < > = values in this interval not displayed.    Iron/TIBC/Ferritin/ %Sat    Component Value Date/Time   IRON 54 07/24/2021 1212   TIBC 277 07/24/2021 1212   FERRITIN 93 07/24/2021 1212   IRONPCTSAT 20 07/24/2021 1212       RADIOGRAPHIC STUDIES: I have personally reviewed the radiological images as listed and agreed with the findings in the report. No results found.    ASSESSMENT & PLAN:  1. Multiple myeloma not having achieved remission ([place])   2. Encounter for antineoplastic chemotherapy   3. Stage 3a chronic kidney disease ([place])   4. Normocytic anemia   5. Bone lesion    Cancer Staging  Multiple myeloma (HCC) Staging form: Plasma Cell Myeloma and Plasma Cell Disorders, AJCC 8th Edition - Clinical stage from 08/22/2021: RISS Stage II (Beta-2-microglobulin (mg/L): 4.1, Albumin (g/dL): 2.8, ISS: Stage II, High-risk cytogenetics: Absent, LDH: Normal) - Signed  by Earlie Server, MD on 09/06/2021    #Multiple myeloma, IgA kappa Labs reviewed and discussed with Proceed with cycle 5 D1 Daratumumab.  Patient starts Revlimid D1-21 Labs reviewed and discussed with patient.  Proceed with cycle 4-day 1 daratumumab Patient takes dexamethasone 20 mg weekly Patient will come back in 2 weeks for Daratumumab treatments. Continue asprin 20m daily and Acyclovir 4044mBID.   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  May consider IVIG if IgG level is persistent less than 400.  #Normocytic anemia, hemoglobin has improved to 10.1.  Monitor.  #Multiple bone lesions identified on PET  scan.  No signs of adenopathy or mass.   Proceed with Zometa today.  Recommend calcium 1200 mg daily.   #Chronic kidney disease, recommend patient to avoid nephrotoxin.  Encourage oral hydration. #Hypoalbuminemia secondary to malnutrition.  Follow-up with nutritionist.  Supportive care measures are necessary for patient well-being and will be provided as necessary.  All questions were answered. The patient knows to call the clinic with any problems questions or concerns.  cc ScEinar PheasantMD    Return of visit:  2 weeks for lab Daratumumab 4 weeks lab MD Daratumumab/zometa  ZhEarlie ServerMD, PhD [facility] H[facility] Oncology 12/20/2021

## 2021-12-20 NOTE — Progress Notes (Signed)
Nutrition Follow-up:  Patient with multiple myeloma.  Patient receiving darzalex-faspro.  Met with patient during infusion.  Patient reports that her appetite is doing good.  Denies any mouth pain or sores.  Says that she usually eats oatmeal with banana and raisins for breakfast, sometimes has piece of toast.  Lunch is cheese and crackers, fruit.  Dinner is meat (chicken or fish) and vegetables.  Sometimes has hamburger.  Drinks ensure or boost shakes 2 times per day.      Medications: reviewed  Labs: reviewed  Anthropometrics:   Weight is 140 lb 1.6 oz   141 lb 6.8 oz on 3/24 143 lb on 2/17 141 lb 8 oz on 2/14 156 lb on 08/08/21  NUTRITION DIAGNOSIS: Unintentional weight loss stable   INTERVENTION:  Continue oral nutrition supplements Continue high calorie, high protein foods    MONITORING, EVALUATION, GOAL: weight trends, intake   NEXT VISIT: as needed  Isreal Moline B. Zenia Resides, [place], [place] Registered Dietitian [phone]

## 2021-12-24 LAB — KAPPA/LAMBDA LIGHT CHAINS
Kappa free light chain: 22.4 mg/L — ABNORMAL HIGH (ref 3.3–19.4)
Kappa, lambda light chain ratio: 3.86 — ABNORMAL HIGH (ref 0.26–1.65)
Lambda free light chains: 5.8 mg/L (ref 5.7–26.3)

## 2021-12-24 LAB — MULTIPLE MYELOMA PANEL, SERUM
Albumin SerPl Elph-Mcnc: 3.6 g/dL (ref 2.9–4.4)
Albumin/Glob SerPl: 1.3 (ref 0.7–1.7)
Alpha 1: 0.2 g/dL (ref 0.0–0.4)
Alpha2 Glob SerPl Elph-Mcnc: 0.7 g/dL (ref 0.4–1.0)
B-Globulin SerPl Elph-Mcnc: 1.5 g/dL — ABNORMAL HIGH (ref 0.7–1.3)
Gamma Glob SerPl Elph-Mcnc: 0.5 g/dL (ref 0.4–1.8)
Globulin, Total: 2.8 g/dL (ref 2.2–3.9)
IgA: 753 mg/dL — ABNORMAL HIGH (ref 64–422)
IgG (Immunoglobin G), Serum: 463 mg/dL — ABNORMAL LOW (ref 586–1602)
IgM (Immunoglobulin M), Srm: 14 mg/dL — ABNORMAL LOW (ref 26–217)
M Protein SerPl Elph-Mcnc: 0.8 g/dL — ABNORMAL HIGH
Total Protein ELP: 6.4 g/dL (ref 6.0–8.5)

## 2021-12-27 ENCOUNTER — Other Ambulatory Visit: Payer: Self-pay | Admitting: *Deleted

## 2021-12-27 MED ORDER — MONTELUKAST SODIUM 10 MG PO TABS: 10.0000 mg | ORAL_TABLET | ORAL | 0 refills | Status: AC

## 2022-01-03 ENCOUNTER — Other Ambulatory Visit: Payer: Self-pay

## 2022-01-03 ENCOUNTER — Inpatient Hospital Stay: Payer: Medicare Other

## 2022-01-03 ENCOUNTER — Inpatient Hospital Stay: Payer: Medicare Other | Attending: Oncology

## 2022-01-03 VITALS — BP 135/58 | HR 69 | Temp 97.1°F | Resp 16 | Ht 62.0 in | Wt 143.8 lb

## 2022-01-03 DIAGNOSIS — R5383 Other fatigue: Secondary | ICD-10-CM | POA: Insufficient documentation

## 2022-01-03 DIAGNOSIS — Z8249 Family history of ischemic heart disease and other diseases of the circulatory system: Secondary | ICD-10-CM | POA: Insufficient documentation

## 2022-01-03 DIAGNOSIS — I129 Hypertensive chronic kidney disease with stage 1 through stage 4 chronic kidney disease, or unspecified chronic kidney disease: Secondary | ICD-10-CM | POA: Diagnosis not present

## 2022-01-03 DIAGNOSIS — N1831 Chronic kidney disease, stage 3a: Secondary | ICD-10-CM | POA: Diagnosis not present

## 2022-01-03 DIAGNOSIS — Z7961 Long term (current) use of immunomodulator: Secondary | ICD-10-CM | POA: Insufficient documentation

## 2022-01-03 DIAGNOSIS — M7989 Other specified soft tissue disorders: Secondary | ICD-10-CM | POA: Diagnosis not present

## 2022-01-03 DIAGNOSIS — E7800 Pure hypercholesterolemia, unspecified: Secondary | ICD-10-CM | POA: Insufficient documentation

## 2022-01-03 DIAGNOSIS — D649 Anemia, unspecified: Secondary | ICD-10-CM | POA: Insufficient documentation

## 2022-01-03 DIAGNOSIS — M549 Dorsalgia, unspecified: Secondary | ICD-10-CM | POA: Diagnosis not present

## 2022-01-03 DIAGNOSIS — Z823 Family history of stroke: Secondary | ICD-10-CM | POA: Insufficient documentation

## 2022-01-03 DIAGNOSIS — Z79899 Other long term (current) drug therapy: Secondary | ICD-10-CM | POA: Diagnosis not present

## 2022-01-03 DIAGNOSIS — Z7952 Long term (current) use of systemic steroids: Secondary | ICD-10-CM | POA: Insufficient documentation

## 2022-01-03 DIAGNOSIS — Z5112 Encounter for antineoplastic immunotherapy: Secondary | ICD-10-CM | POA: Diagnosis not present

## 2022-01-03 DIAGNOSIS — Z8261 Family history of arthritis: Secondary | ICD-10-CM | POA: Insufficient documentation

## 2022-01-03 DIAGNOSIS — C9000 Multiple myeloma not having achieved remission: Secondary | ICD-10-CM

## 2022-01-03 DIAGNOSIS — Z803 Family history of malignant neoplasm of breast: Secondary | ICD-10-CM | POA: Diagnosis not present

## 2022-01-03 LAB — COMPREHENSIVE METABOLIC PANEL
ALT: 19 U/L (ref 0–44)
AST: 21 U/L (ref 15–41)
Albumin: 3.2 g/dL — ABNORMAL LOW (ref 3.5–5.0)
Alkaline Phosphatase: 49 U/L (ref 38–126)
Anion gap: 8 (ref 5–15)
BUN: 20 mg/dL (ref 8–23)
CO2: 25 mmol/L (ref 22–32)
Calcium: 8.7 mg/dL — ABNORMAL LOW (ref 8.9–10.3)
Chloride: 108 mmol/L (ref 98–111)
Creatinine, Ser: 0.99 mg/dL (ref 0.44–1.00)
GFR, Estimated: 56 mL/min — ABNORMAL LOW (ref >60)
Glucose, Bld: 105 mg/dL — ABNORMAL HIGH (ref 70–99)
Potassium: 3.9 mmol/L (ref 3.5–5.1)
Sodium: 141 mmol/L (ref 135–145)
Total Bilirubin: 0.4 mg/dL (ref 0.3–1.2)
Total Protein: 6.8 g/dL (ref 6.5–8.1)

## 2022-01-03 LAB — CBC WITH DIFFERENTIAL/PLATELET
Abs Immature Granulocytes: 0.03 10*3/uL (ref 0.00–0.07)
Basophils Absolute: 0.1 10*3/uL (ref 0.0–0.1)
Basophils Relative: 2 %
Eosinophils Absolute: 0.1 10*3/uL (ref 0.0–0.5)
Eosinophils Relative: 3 %
HCT: 30.6 % — ABNORMAL LOW (ref 36.0–46.0)
Hemoglobin: 9.8 g/dL — ABNORMAL LOW (ref 12.0–15.0)
Immature Granulocytes: 1 %
Lymphocytes Relative: 11 %
Lymphs Abs: 0.4 10*3/uL — ABNORMAL LOW (ref 0.7–4.0)
MCH: 31.6 pg (ref 26.0–34.0)
MCHC: 32 g/dL (ref 30.0–36.0)
MCV: 98.7 fL (ref 80.0–100.0)
Monocytes Absolute: 0.2 10*3/uL (ref 0.1–1.0)
Monocytes Relative: 5 %
Neutro Abs: 3.1 10*3/uL (ref 1.7–7.7)
Neutrophils Relative %: 78 %
Platelets: 190 10*3/uL (ref 150–400)
RBC: 3.1 MIL/uL — ABNORMAL LOW (ref 3.87–5.11)
RDW: 14.7 % (ref 11.5–15.5)
WBC: 3.9 10*3/uL — ABNORMAL LOW (ref 4.0–10.5)
nRBC: 0 % (ref 0.0–0.2)

## 2022-01-03 MED ORDER — ACETAMINOPHEN 325 MG PO TABS
650.0000 mg | ORAL_TABLET | Freq: Once | ORAL | Status: AC
  Administered 2022-01-03: 650 mg via ORAL
  Filled 2022-01-03: qty 2

## 2022-01-03 MED ORDER — DARATUMUMAB-HYALURONIDASE-FIHJ 1800-30000 MG-UT/15ML ~~LOC~~ SOLN
1800.0000 mg | Freq: Once | SUBCUTANEOUS | Status: AC
  Administered 2022-01-03: 1800 mg via SUBCUTANEOUS
  Filled 2022-01-03: qty 15

## 2022-01-03 MED ORDER — DIPHENHYDRAMINE HCL 25 MG PO CAPS
50.0000 mg | ORAL_CAPSULE | Freq: Once | ORAL | Status: AC
  Administered 2022-01-03: 50 mg via ORAL
  Filled 2022-01-03: qty 2

## 2022-01-03 NOTE — Patient Instructions (Signed)
[facility] CANCER CTR AT [place]  Discharge Instructions: Thank you for choosing [facility] to provide your oncology and hematology care.  If you have a lab appointment with the [facility], please go directly to the [facility] and check in at the registration area.  Wear comfortable clothing and clothing appropriate for easy access to any Portacath or PICC line.   We strive to give you quality time with your provider. You may need to reschedule your appointment if you arrive late (15 or more minutes).  Arriving late affects you and other patients whose appointments are after yours.  Also, if you miss three or more appointments without notifying the office, you may be dismissed from the clinic at the provider's discretion.      For prescription refill requests, have your pharmacy contact our office and allow 72 hours for refills to be completed.    Today you received the following chemotherapy and/or immunotherapy agents Darzalex      To help prevent nausea and vomiting after your treatment, we encourage you to take your nausea medication as directed.  BELOW ARE SYMPTOMS THAT SHOULD BE REPORTED IMMEDIATELY: *FEVER GREATER THAN 100.4 F (38 C) OR HIGHER *CHILLS OR SWEATING *NAUSEA AND VOMITING THAT IS NOT CONTROLLED WITH YOUR NAUSEA MEDICATION *UNUSUAL SHORTNESS OF BREATH *UNUSUAL BRUISING OR BLEEDING *URINARY PROBLEMS (pain or burning when urinating, or frequent urination) *BOWEL PROBLEMS (unusual diarrhea, constipation, pain near the anus) TENDERNESS IN MOUTH AND THROAT WITH OR WITHOUT PRESENCE OF ULCERS (sore throat, sores in mouth, or a toothache) UNUSUAL RASH, SWELLING OR PAIN  UNUSUAL VAGINAL DISCHARGE OR ITCHING   Items with * indicate a potential emergency and should be followed up as soon as possible or go to the Emergency Department if any problems should occur.  Please show the CHEMOTHERAPY ALERT CARD or IMMUNOTHERAPY ALERT CARD at check-in to  the Emergency Department and triage nurse.  Should you have questions after your visit or need to cancel or reschedule your appointment, please contact [facility] CANCER [place] AT [place]  [phone] and follow the prompts.  Office hours are 8:00 a.m. to 4:30 p.m. Monday - Friday. Please note that voicemails left after 4:00 p.m. may not be returned until the following business day.  We are closed weekends and major holidays. You have access to a nurse at all times for urgent questions. Please call the main number to the clinic [phone] and follow the prompts.  For any non-urgent questions, you may also contact your provider using MyChart. We now offer e-Visits for anyone 14 and older to request care online for non-urgent symptoms. For details visit [email or web address].   Also download the MyChart app! Go to the app store, search "MyChart", open the app, select [place], and log in with your MyChart username and password.  Due to Covid, a mask is required upon entering the hospital/clinic. If you do not have a mask, one will be given to you upon arrival. For doctor visits, patients may have 1 support person aged 28 or older with them. For treatment visits, patients cannot have anyone with them due to current Covid guidelines and our immunocompromised population.

## 2022-01-10 ENCOUNTER — Other Ambulatory Visit: Payer: Self-pay | Admitting: *Deleted

## 2022-01-10 DIAGNOSIS — C9000 Multiple myeloma not having achieved remission: Secondary | ICD-10-CM

## 2022-01-10 MED ORDER — LENALIDOMIDE 10 MG PO CAPS: 10.0000 mg | ORAL_CAPSULE | Freq: Every day | ORAL | 0 refills | Status: DC

## 2022-01-12 ENCOUNTER — Encounter: Payer: Self-pay | Admitting: Internal Medicine

## 2022-01-17 ENCOUNTER — Inpatient Hospital Stay (HOSPITAL_BASED_OUTPATIENT_CLINIC_OR_DEPARTMENT_OTHER): Payer: Medicare Other | Admitting: Oncology

## 2022-01-17 ENCOUNTER — Inpatient Hospital Stay: Payer: Medicare Other

## 2022-01-17 ENCOUNTER — Encounter: Payer: Self-pay | Admitting: Oncology

## 2022-01-17 VITALS — BP 145/60 | HR 56 | Temp 97.1°F | Ht 62.0 in | Wt 142.0 lb

## 2022-01-17 VITALS — BP 126/53 | HR 55 | Temp 97.4°F | Resp 16

## 2022-01-17 DIAGNOSIS — C9000 Multiple myeloma not having achieved remission: Secondary | ICD-10-CM

## 2022-01-17 DIAGNOSIS — Z8249 Family history of ischemic heart disease and other diseases of the circulatory system: Secondary | ICD-10-CM | POA: Diagnosis not present

## 2022-01-17 DIAGNOSIS — Z5111 Encounter for antineoplastic chemotherapy: Secondary | ICD-10-CM

## 2022-01-17 DIAGNOSIS — I129 Hypertensive chronic kidney disease with stage 1 through stage 4 chronic kidney disease, or unspecified chronic kidney disease: Secondary | ICD-10-CM | POA: Diagnosis not present

## 2022-01-17 DIAGNOSIS — M7989 Other specified soft tissue disorders: Secondary | ICD-10-CM | POA: Diagnosis not present

## 2022-01-17 DIAGNOSIS — D649 Anemia, unspecified: Secondary | ICD-10-CM

## 2022-01-17 DIAGNOSIS — M899 Disorder of bone, unspecified: Secondary | ICD-10-CM

## 2022-01-17 DIAGNOSIS — Z79899 Other long term (current) drug therapy: Secondary | ICD-10-CM | POA: Diagnosis not present

## 2022-01-17 DIAGNOSIS — Z7961 Long term (current) use of immunomodulator: Secondary | ICD-10-CM | POA: Diagnosis not present

## 2022-01-17 DIAGNOSIS — Z823 Family history of stroke: Secondary | ICD-10-CM | POA: Diagnosis not present

## 2022-01-17 DIAGNOSIS — N1831 Chronic kidney disease, stage 3a: Secondary | ICD-10-CM

## 2022-01-17 DIAGNOSIS — Z5112 Encounter for antineoplastic immunotherapy: Secondary | ICD-10-CM | POA: Diagnosis not present

## 2022-01-17 DIAGNOSIS — R5383 Other fatigue: Secondary | ICD-10-CM | POA: Diagnosis not present

## 2022-01-17 DIAGNOSIS — E7800 Pure hypercholesterolemia, unspecified: Secondary | ICD-10-CM | POA: Diagnosis not present

## 2022-01-17 DIAGNOSIS — Z7952 Long term (current) use of systemic steroids: Secondary | ICD-10-CM | POA: Diagnosis not present

## 2022-01-17 DIAGNOSIS — M549 Dorsalgia, unspecified: Secondary | ICD-10-CM | POA: Diagnosis not present

## 2022-01-17 DIAGNOSIS — Z803 Family history of malignant neoplasm of breast: Secondary | ICD-10-CM | POA: Diagnosis not present

## 2022-01-17 DIAGNOSIS — Z8261 Family history of arthritis: Secondary | ICD-10-CM | POA: Diagnosis not present

## 2022-01-17 LAB — CBC WITH DIFFERENTIAL/PLATELET
Abs Immature Granulocytes: 0.02 10*3/uL (ref 0.00–0.07)
Basophils Absolute: 0.1 10*3/uL (ref 0.0–0.1)
Basophils Relative: 2 %
Eosinophils Absolute: 0.2 10*3/uL (ref 0.0–0.5)
Eosinophils Relative: 4 %
HCT: 30.4 % — ABNORMAL LOW (ref 36.0–46.0)
Hemoglobin: 9.7 g/dL — ABNORMAL LOW (ref 12.0–15.0)
Immature Granulocytes: 0 %
Lymphocytes Relative: 25 %
Lymphs Abs: 1.1 10*3/uL (ref 0.7–4.0)
MCH: 31.6 pg (ref 26.0–34.0)
MCHC: 31.9 g/dL (ref 30.0–36.0)
MCV: 99 fL (ref 80.0–100.0)
Monocytes Absolute: 0.4 10*3/uL (ref 0.1–1.0)
Monocytes Relative: 8 %
Neutro Abs: 2.8 10*3/uL (ref 1.7–7.7)
Neutrophils Relative %: 61 %
Platelets: 248 10*3/uL (ref 150–400)
RBC: 3.07 MIL/uL — ABNORMAL LOW (ref 3.87–5.11)
RDW: 14.6 % (ref 11.5–15.5)
WBC: 4.5 10*3/uL (ref 4.0–10.5)
nRBC: 0 % (ref 0.0–0.2)

## 2022-01-17 LAB — COMPREHENSIVE METABOLIC PANEL
ALT: 17 U/L (ref 0–44)
AST: 23 U/L (ref 15–41)
Albumin: 3.3 g/dL — ABNORMAL LOW (ref 3.5–5.0)
Alkaline Phosphatase: 47 U/L (ref 38–126)
Anion gap: 8 (ref 5–15)
BUN: 19 mg/dL (ref 8–23)
CO2: 24 mmol/L (ref 22–32)
Calcium: 8.6 mg/dL — ABNORMAL LOW (ref 8.9–10.3)
Chloride: 107 mmol/L (ref 98–111)
Creatinine, Ser: 0.91 mg/dL (ref 0.44–1.00)
GFR, Estimated: >60 mL/min (ref >60)
Glucose, Bld: 85 mg/dL (ref 70–99)
Potassium: 3.9 mmol/L (ref 3.5–5.1)
Sodium: 139 mmol/L (ref 135–145)
Total Bilirubin: 0.4 mg/dL (ref 0.3–1.2)
Total Protein: 6.6 g/dL (ref 6.5–8.1)

## 2022-01-17 MED ORDER — DEXAMETHASONE 4 MG PO TABS: 20.0000 mg | ORAL_TABLET | ORAL | 0 refills | Status: DC

## 2022-01-17 MED ORDER — ACETAMINOPHEN 325 MG PO TABS
650.0000 mg | ORAL_TABLET | Freq: Once | ORAL | Status: AC
  Administered 2022-01-17: 650 mg via ORAL
  Filled 2022-01-17: qty 2

## 2022-01-17 MED ORDER — DIPHENHYDRAMINE HCL 25 MG PO CAPS
50.0000 mg | ORAL_CAPSULE | Freq: Once | ORAL | Status: AC
  Administered 2022-01-17: 50 mg via ORAL
  Filled 2022-01-17: qty 2

## 2022-01-17 MED ORDER — DARATUMUMAB-HYALURONIDASE-FIHJ 1800-30000 MG-UT/15ML ~~LOC~~ SOLN
1800.0000 mg | Freq: Once | SUBCUTANEOUS | Status: AC
  Administered 2022-01-17: 1800 mg via SUBCUTANEOUS
  Filled 2022-01-17: qty 15

## 2022-01-18 ENCOUNTER — Encounter: Payer: Self-pay | Admitting: Oncology

## 2022-01-18 NOTE — Assessment & Plan Note (Signed)
Continue monitor

## 2022-01-18 NOTE — Assessment & Plan Note (Signed)
Chemotherapy plan as listed as above. 

## 2022-01-18 NOTE — Progress Notes (Signed)
Hematology/Oncology Progress note [phone] [phone]         Patient Care Team: Dale [place], MD as PCP - General (Internal Medicine)  ASSESSMENT & PLAN:   Multiple myeloma (HCC) Multiple myeloma, IgA kappa Labs reviewed and discussed with Proceed with this cycle of D1 Daratumumab.  Patient starts Revlimid D1-21 Patient takes dexamethasone 20 mg weekly Patient will come back in 2 weeks for Daratumumab treatments. Continue asprin 81mg  daily and Acyclovir 400mg  BID.   Bone lesion Plan Zometa monthly Recommend calcium 1200 mg daily.  Encounter for antineoplastic chemotherapy Chemotherapy plan as listed as above.  Normocytic anemia Continue monitor.   Stage 3a chronic kidney disease (HCC) recommend patient to avoid nephrotoxin.  Encourage oral hydration.  Swelling of lower extremity Check lower extremity ultrasound   Orders Placed This Encounter  Procedures   US Venous Img Lower Bilateral    Standing Status:   Future    Standing Expiration Date:   01/17/2023    Order Specific Question:   Reason for Exam (SYMPTOM  OR DIAGNOSIS REQUIRED)    Answer:   leg swelling    Order Specific Question:   Preferred imaging location?    Answer:   [facility]   Follow up  Lab/Dara in 2 weeks [place] LE bilaterally ASAP - leg swelling  Lab/MD/Dara/Zometa in 4 weeks  All questions were answered. The patient knows to call the clinic with any problems, questions or concerns.  Rickard Patience, MD, PhD [facility] Hematology Oncology 01/17/2022     CHIEF COMPLAINTS/REASON FOR VISIT:  Follow-up for multiple myeloma  HISTORY OF PRESENTING ILLNESS:   Stephanie Delacruz is a  88 y.o.  female with PMH listed below was seen in consultation at the request of  Dale [place], MD  for evaluation of anemia 07/15/2021, patient had CBC checked at the primary care provider's office.  CBC showed hemoglobin 7.7, hematocrit 23.9, white count 3.6, RDW 16.5.  Platelet count  166. Reviewed previous lab records.  Patient has chronic anemia dating back at least 2016. Her baseline prior to 2018 was about 10.   Hemoglobin was 9.1 in March 2022, and further dropped to 8.1 in October 2022, then November dropped to 7.7. Patient denies being significantly affected, tired, lightheadedness, shortness of breath.  Appetite is fair.  Denies any black or bloody stool.   Denies any history of hemoglobinopathy  08/26/2021 bone marrow biopsy showed hypercellular marrow with plasma cell neoplasm.  67% plasma cells in the aspirate.  Cytogenetics showed complex hyperplasia, gain of 1 q.  # 08/26/2021 started on Daratumumab+ dexamethasone # 09/06/2021 added Revlimid 10mg  x 14 days.  #09/27/2021, cycle 2 Revlimid 10 mg for 21 days.  INTERVAL HISTORY Stephanie Delacruz is a 88 y.o. female who has above history reviewed by me today presents for follow up visit for management of multiple myeloma Patient tolerates daratumumab -RD regimen well. She was accompanied by her daughter today.  She denies any nausea vomiting diarrhea.  Appetite is good.  Her weight has been stable.   Review of Systems  Constitutional:  Positive for fatigue. Negative for appetite change, chills and fever.  HENT:   Negative for hearing loss and voice change.   Eyes:  Negative for eye problems.  Respiratory:  Negative for chest tightness and cough.   Cardiovascular:  Negative for chest pain.  Gastrointestinal:  Negative for abdominal distention, abdominal pain and blood in stool.  Endocrine: Negative for hot flashes.  Genitourinary:  Negative for difficulty  urinating and frequency.   Musculoskeletal:  Positive for back pain. Negative for arthralgias.  Skin:  Negative for itching and rash.  Neurological:  Negative for extremity weakness.  Hematological:  Negative for adenopathy.  Psychiatric/Behavioral:  Negative for confusion.     MEDICAL HISTORY:  Past Medical History:  Diagnosis Date   Anemia    GERD  (gastroesophageal reflux disease)    Hypercholesterolemia    Hypertension    Renal insufficiency     SURGICAL HISTORY: Past Surgical History:  Procedure Laterality Date   TONSILLECTOMY     TUBAL LIGATION      SOCIAL HISTORY: Social History   Socioeconomic History   Marital status: Married    Spouse name: Not on file   Number of children: Not on file   Years of education: Not on file   Highest education level: Not on file  Occupational History   Not on file  Tobacco Use   Smoking status: Never   Smokeless tobacco: Never  Substance and Sexual Activity   Alcohol use: No   Drug use: No   Sexual activity: Not on file  Other Topics Concern   Not on file  Social History Narrative   Not on file   Social Determinants of Health   Financial Resource Strain: Not on file  Food Insecurity: Not on file  Transportation Needs: Not on file  Physical Activity: Not on file  Stress: Not on file  Social Connections: Not on file  Intimate Partner Violence: Not on file    FAMILY HISTORY: Family History  Problem Relation Age of Onset   Hypertension Mother    Arthritis Mother    Hypertension Father    Stroke Father    Breast cancer Niece     ALLERGIES:  has No Known Allergies.  MEDICATIONS:  Current Outpatient Medications  Medication Sig Dispense Refill   acyclovir (ZOVIRAX) 400 MG tablet Take 1 tablet (400 mg total) by mouth 2 (two) times daily. 60 tablet 11   amLODipine (NORVASC) 2.5 MG tablet Take 10 mg by mouth daily.     ascorbic acid (VITAMIN C) 250 MG tablet daily Take 2 tablets every day by oral route.     ascorbic acid (VITAMIN C) 500 MG tablet Take 500 mg by mouth daily.     aspirin 81 MG EC tablet Take 81 mg by mouth daily.     Calcium Carb-Cholecalciferol (CALCIUM 500 + D PO) Take by mouth.     donepezil (ARICEPT) 10 MG tablet Take 10 mg by mouth daily.     fenofibrate (TRICOR) 48 MG tablet Take 48 mg by mouth daily.     fexofenadine (ALLEGRA) 180 MG tablet  Take by mouth.     hydrochlorothiazide (MICROZIDE) 12.5 MG capsule Take 12.5 mg by mouth daily.     lenalidomide (REVLIMID) 10 MG capsule Take 1 capsule (10 mg total) by mouth daily. Take for 21 days, then hold for 7 days. Repeat every 28 days. 21 capsule 0   losartan (COZAAR) 100 MG tablet Take 100 mg by mouth daily.     magic mouthwash (multi-ingredient) oral suspension Swish and swallow 5-22ml four times a day as needed for mouth pain 480 mL 1   magic mouthwash w/lidocaine SOLN Take 5 mLs by mouth 4 (four) times daily as needed for mouth pain. Sig: Swish/Swallow 5-10 ml four times a day as needed. Dispense 480 ml. 1RF 480 mL 1   memantine (NAMENDA) 5 MG tablet Take 5 mg by mouth  2 (two) times daily.     metoprolol succinate (TOPROL-XL) 25 MG 24 hr tablet Take 50 mg by mouth daily.     montelukast (SINGULAIR) 10 MG tablet Take 1 tablet (10 mg total) by mouth See admin instructions. Take 1 tablet one day before Daratumumab injection. 60 tablet 0   Multiple Vitamins-Minerals (WOMENS MULTIVITAMIN PO) Take 1 tablet by mouth daily.     omeprazole (PRILOSEC) 20 MG capsule TAKE 1 CAPSULE(20 MG) BY MOUTH DAILY 90 capsule 0   prochlorperazine (COMPAZINE) 10 MG tablet Take 1 tablet (10 mg total) by mouth every 6 (six) hours as needed (Nausea or vomiting). 30 tablet 1   dexamethasone (DECADRON) 4 MG tablet Take 5 tablets (20 mg total) by mouth once a week. 60 tablet 0   potassium chloride (KLOR-CON M) 10 MEQ tablet TAKE 1 TABLET(10 MEQ) BY MOUTH DAILY (Patient not taking: Reported on 12/20/2021) 30 tablet 2   No current facility-administered medications for this visit.     PHYSICAL EXAMINATION: ECOG PERFORMANCE STATUS: 1 - Symptomatic but completely ambulatory Vitals:   01/17/22 0850  BP: (!) 145/60  Pulse: (!) 56  Temp: (!) 97.1 F (36.2 C)   Filed Weights   01/17/22 0850  Weight: 142 lb (64.4 kg)    Physical Exam Constitutional:      General: She is not in acute distress. HENT:     Head:  Normocephalic and atraumatic.  Eyes:     General: No scleral icterus. Cardiovascular:     Rate and Rhythm: Normal rate and regular rhythm.     Heart sounds: Normal heart sounds.  Pulmonary:     Effort: Pulmonary effort is normal. No respiratory distress.     Breath sounds: No wheezing.  Abdominal:     General: Bowel sounds are normal. There is no distension.     Palpations: Abdomen is soft.  Musculoskeletal:        General: No deformity. Normal range of motion.     Cervical back: Normal range of motion and neck supple.  Skin:    General: Skin is warm and dry.     Findings: No erythema or rash.  Neurological:     Mental Status: She is alert and oriented to person, place, and time. Mental status is at baseline.     Cranial Nerves: No cranial nerve deficit.     Coordination: Coordination normal.  Psychiatric:        Mood and Affect: Mood normal.     LABORATORY DATA:  I have reviewed the data as listed    Latest Ref Rng & Units 01/17/2022    8:36 AM 01/03/2022   12:43 PM 12/20/2021    8:08 AM  CBC  WBC 4.0 - 10.5 K/uL 4.5  3.9  4.4   Hemoglobin 12.0 - 15.0 g/dL 9.7  9.8  40.9   Hematocrit 36.0 - 46.0 % 30.4  30.6  31.2   Platelets 150 - 400 K/uL 248  190  227       Latest Ref Rng & Units 01/17/2022    8:36 AM 01/03/2022   12:43 PM 12/20/2021    8:08 AM  CMP  Glucose 70 - 99 mg/dL 85  811  91   BUN 8 - 23 mg/dL 19  20  17    Creatinine 0.44 - 1.00 mg/dL 9.14  7.82  9.56   Sodium 135 - 145 mmol/L 139  141  139   Potassium 3.5 - 5.1 mmol/L 3.9  3.9  3.7   Chloride 98 - 111 mmol/L 107  108  106   CO2 22 - 32 mmol/L 24  25  25    Calcium 8.9 - 10.3 mg/dL 8.6  8.7  9.0   Total Protein 6.5 - 8.1 g/dL 6.6  6.8  6.7   Total Bilirubin 0.3 - 1.2 mg/dL 0.4  0.4  0.6   Alkaline Phos 38 - 126 U/L 47  49  55   AST 15 - 41 U/L 23  21  22    ALT 0 - 44 U/L 17  19  20       Iron/TIBC/Ferritin/ %Sat    Component Value Date/Time   IRON 54 07/24/2021 1212   TIBC 277 07/24/2021 1212    FERRITIN 93 07/24/2021 1212   IRONPCTSAT 20 07/24/2021 1212       RADIOGRAPHIC STUDIES: I have personally reviewed the radiological images as listed and agreed with the findings in the report. No results found.

## 2022-01-20 LAB — KAPPA/LAMBDA LIGHT CHAINS
Kappa free light chain: 20.8 mg/L — ABNORMAL HIGH (ref 3.3–19.4)
Kappa, lambda light chain ratio: 2.74 — ABNORMAL HIGH (ref 0.26–1.65)
Lambda free light chains: 7.6 mg/L (ref 5.7–26.3)

## 2022-01-22 LAB — MULTIPLE MYELOMA PANEL, SERUM
Albumin SerPl Elph-Mcnc: 3.1 g/dL (ref 2.9–4.4)
Albumin/Glob SerPl: 1.2 (ref 0.7–1.7)
Alpha 1: 0.2 g/dL (ref 0.0–0.4)
Alpha2 Glob SerPl Elph-Mcnc: 0.7 g/dL (ref 0.4–1.0)
B-Globulin SerPl Elph-Mcnc: 1.4 g/dL — ABNORMAL HIGH (ref 0.7–1.3)
Gamma Glob SerPl Elph-Mcnc: 0.4 g/dL (ref 0.4–1.8)
Globulin, Total: 2.7 g/dL (ref 2.2–3.9)
IgA: 622 mg/dL — ABNORMAL HIGH (ref 64–422)
IgG (Immunoglobin G), Serum: 466 mg/dL — ABNORMAL LOW (ref 586–1602)
IgM (Immunoglobulin M), Srm: 15 mg/dL — ABNORMAL LOW (ref 26–217)
M Protein SerPl Elph-Mcnc: 0.7 g/dL — ABNORMAL HIGH
Total Protein ELP: 5.8 g/dL — ABNORMAL LOW (ref 6.0–8.5)

## 2022-01-24 ENCOUNTER — Other Ambulatory Visit: Payer: Self-pay

## 2022-01-24 ENCOUNTER — Other Ambulatory Visit (INDEPENDENT_AMBULATORY_CARE_PROVIDER_SITE_OTHER): Payer: Self-pay | Admitting: Nurse Practitioner

## 2022-01-24 ENCOUNTER — Ambulatory Visit
Admission: RE | Admit: 2022-01-24 | Discharge: 2022-01-24 | Disposition: A | Discharge status: Home / Self Care | Payer: Medicare Other | Source: Ambulatory Visit | Attending: Oncology | Admitting: Oncology

## 2022-01-24 ENCOUNTER — Encounter: Payer: Self-pay | Admitting: Internal Medicine

## 2022-01-24 DIAGNOSIS — M7989 Other specified soft tissue disorders: Secondary | ICD-10-CM | POA: Insufficient documentation

## 2022-01-24 DIAGNOSIS — R600 Localized edema: Secondary | ICD-10-CM | POA: Diagnosis not present

## 2022-01-24 DIAGNOSIS — R0989 Other specified symptoms and signs involving the circulatory and respiratory systems: Secondary | ICD-10-CM

## 2022-01-24 MED ORDER — FENOFIBRATE 48 MG PO TABS: 48.0000 mg | ORAL_TABLET | Freq: Every day | ORAL | 3 refills | Status: DC

## 2022-01-29 ENCOUNTER — Ambulatory Visit (INDEPENDENT_AMBULATORY_CARE_PROVIDER_SITE_OTHER): Payer: Medicare Other | Admitting: Nurse Practitioner

## 2022-01-29 ENCOUNTER — Ambulatory Visit (INDEPENDENT_AMBULATORY_CARE_PROVIDER_SITE_OTHER): Payer: Self-pay

## 2022-01-29 ENCOUNTER — Encounter (INDEPENDENT_AMBULATORY_CARE_PROVIDER_SITE_OTHER): Payer: Self-pay | Admitting: Nurse Practitioner

## 2022-01-29 VITALS — BP 151/72 | HR 65 | Resp 16 | Wt 141.8 lb

## 2022-01-29 DIAGNOSIS — N1831 Chronic kidney disease, stage 3a: Secondary | ICD-10-CM | POA: Diagnosis not present

## 2022-01-29 DIAGNOSIS — R0989 Other specified symptoms and signs involving the circulatory and respiratory systems: Secondary | ICD-10-CM | POA: Diagnosis not present

## 2022-01-29 DIAGNOSIS — M7989 Other specified soft tissue disorders: Secondary | ICD-10-CM

## 2022-01-29 DIAGNOSIS — F0390 Unspecified dementia without behavioral disturbance: Secondary | ICD-10-CM | POA: Insufficient documentation

## 2022-01-31 ENCOUNTER — Inpatient Hospital Stay: Payer: Medicare Other

## 2022-01-31 ENCOUNTER — Inpatient Hospital Stay: Payer: Medicare Other | Attending: Oncology

## 2022-01-31 VITALS — BP 129/54 | HR 59 | Temp 99.1°F | Resp 16 | Wt 144.4 lb

## 2022-01-31 DIAGNOSIS — M7989 Other specified soft tissue disorders: Secondary | ICD-10-CM | POA: Diagnosis not present

## 2022-01-31 DIAGNOSIS — Z7982 Long term (current) use of aspirin: Secondary | ICD-10-CM | POA: Insufficient documentation

## 2022-01-31 DIAGNOSIS — Z8261 Family history of arthritis: Secondary | ICD-10-CM | POA: Insufficient documentation

## 2022-01-31 DIAGNOSIS — Z823 Family history of stroke: Secondary | ICD-10-CM | POA: Insufficient documentation

## 2022-01-31 DIAGNOSIS — Z5112 Encounter for antineoplastic immunotherapy: Secondary | ICD-10-CM | POA: Insufficient documentation

## 2022-01-31 DIAGNOSIS — Z7961 Long term (current) use of immunomodulator: Secondary | ICD-10-CM | POA: Insufficient documentation

## 2022-01-31 DIAGNOSIS — N1831 Chronic kidney disease, stage 3a: Secondary | ICD-10-CM | POA: Diagnosis not present

## 2022-01-31 DIAGNOSIS — Z8249 Family history of ischemic heart disease and other diseases of the circulatory system: Secondary | ICD-10-CM | POA: Insufficient documentation

## 2022-01-31 DIAGNOSIS — C9000 Multiple myeloma not having achieved remission: Secondary | ICD-10-CM | POA: Diagnosis not present

## 2022-01-31 DIAGNOSIS — Z79899 Other long term (current) drug therapy: Secondary | ICD-10-CM | POA: Diagnosis not present

## 2022-01-31 DIAGNOSIS — Z79624 Long term (current) use of inhibitors of nucleotide synthesis: Secondary | ICD-10-CM | POA: Diagnosis not present

## 2022-01-31 DIAGNOSIS — E7800 Pure hypercholesterolemia, unspecified: Secondary | ICD-10-CM | POA: Insufficient documentation

## 2022-01-31 DIAGNOSIS — Z7952 Long term (current) use of systemic steroids: Secondary | ICD-10-CM | POA: Diagnosis not present

## 2022-01-31 DIAGNOSIS — I129 Hypertensive chronic kidney disease with stage 1 through stage 4 chronic kidney disease, or unspecified chronic kidney disease: Secondary | ICD-10-CM | POA: Diagnosis not present

## 2022-01-31 DIAGNOSIS — Z803 Family history of malignant neoplasm of breast: Secondary | ICD-10-CM | POA: Diagnosis not present

## 2022-01-31 DIAGNOSIS — D649 Anemia, unspecified: Secondary | ICD-10-CM | POA: Diagnosis not present

## 2022-01-31 DIAGNOSIS — R5383 Other fatigue: Secondary | ICD-10-CM | POA: Diagnosis not present

## 2022-01-31 LAB — CBC WITH DIFFERENTIAL/PLATELET
Abs Immature Granulocytes: 0.02 10*3/uL (ref 0.00–0.07)
Basophils Absolute: 0.1 10*3/uL (ref 0.0–0.1)
Basophils Relative: 2 %
Eosinophils Absolute: 0.3 10*3/uL (ref 0.0–0.5)
Eosinophils Relative: 6 %
HCT: 29.7 % — ABNORMAL LOW (ref 36.0–46.0)
Hemoglobin: 9.6 g/dL — ABNORMAL LOW (ref 12.0–15.0)
Immature Granulocytes: 0 %
Lymphocytes Relative: 23 %
Lymphs Abs: 1.2 10*3/uL (ref 0.7–4.0)
MCH: 31.8 pg (ref 26.0–34.0)
MCHC: 32.3 g/dL (ref 30.0–36.0)
MCV: 98.3 fL (ref 80.0–100.0)
Monocytes Absolute: 0.7 10*3/uL (ref 0.1–1.0)
Monocytes Relative: 14 %
Neutro Abs: 2.9 10*3/uL (ref 1.7–7.7)
Neutrophils Relative %: 55 %
Platelets: 172 10*3/uL (ref 150–400)
RBC: 3.02 MIL/uL — ABNORMAL LOW (ref 3.87–5.11)
RDW: 14.5 % (ref 11.5–15.5)
WBC: 5.3 10*3/uL (ref 4.0–10.5)
nRBC: 0 % (ref 0.0–0.2)

## 2022-01-31 LAB — COMPREHENSIVE METABOLIC PANEL
ALT: 15 U/L (ref 0–44)
AST: 22 U/L (ref 15–41)
Albumin: 3.2 g/dL — ABNORMAL LOW (ref 3.5–5.0)
Alkaline Phosphatase: 43 U/L (ref 38–126)
Anion gap: 7 (ref 5–15)
BUN: 15 mg/dL (ref 8–23)
CO2: 25 mmol/L (ref 22–32)
Calcium: 8.3 mg/dL — ABNORMAL LOW (ref 8.9–10.3)
Chloride: 106 mmol/L (ref 98–111)
Creatinine, Ser: 1 mg/dL (ref 0.44–1.00)
GFR, Estimated: 55 mL/min — ABNORMAL LOW (ref >60)
Glucose, Bld: 91 mg/dL (ref 70–99)
Potassium: 3.6 mmol/L (ref 3.5–5.1)
Sodium: 138 mmol/L (ref 135–145)
Total Bilirubin: 0.7 mg/dL (ref 0.3–1.2)
Total Protein: 6.1 g/dL — ABNORMAL LOW (ref 6.5–8.1)

## 2022-01-31 MED ORDER — DIPHENHYDRAMINE HCL 25 MG PO CAPS
50.0000 mg | ORAL_CAPSULE | Freq: Once | ORAL | Status: AC
  Administered 2022-01-31: 50 mg via ORAL
  Filled 2022-01-31: qty 2

## 2022-01-31 MED ORDER — DARATUMUMAB-HYALURONIDASE-FIHJ 1800-30000 MG-UT/15ML ~~LOC~~ SOLN
1800.0000 mg | Freq: Once | SUBCUTANEOUS | Status: AC
  Administered 2022-01-31: 1800 mg via SUBCUTANEOUS
  Filled 2022-01-31: qty 15

## 2022-01-31 MED ORDER — ACETAMINOPHEN 325 MG PO TABS
650.0000 mg | ORAL_TABLET | Freq: Once | ORAL | Status: AC
  Administered 2022-01-31: 650 mg via ORAL
  Filled 2022-01-31: qty 2

## 2022-01-31 NOTE — Progress Notes (Signed)
Patient monitored x 20 minutes post Darzalex injection. Patient tolerated treatment well.

## 2022-01-31 NOTE — Patient Instructions (Signed)
[facility]CER CTR AT [place]-[facility]ICAL ONCOLOGY  Discharge Instructions: Thank you for choosing [facility] to provide your oncology and hematology care.  If you have a lab appointment with the Cancer Center, please go directly to the Cancer Center and check in at the registration area.  Wear comfortable clothing and clothing appropriate for easy access to any Portacath or PICC line.   We strive to give you quality time with your provider. You may need to reschedule your appointment if you arrive late (15 or more minutes).  Arriving late affects you and other patients whose appointments are after yours.  Also, if you miss three or more appointments without notifying the office, you may be dismissed from the clinic at the provider's discretion.      For prescription refill requests, have your pharmacy contact our office and allow 72 hours for refills to be completed.    Today you received the following chemotherapy and/or immunotherapy agents Darzalex      To help prevent nausea and vomiting after your treatment, we encourage you to take your nausea medication as directed.  BELOW ARE SYMPTOMS THAT SHOULD BE REPORTED IMMEDIATELY: *FEVER GREATER THAN 100.4 F (38 C) OR HIGHER *CHILLS OR SWEATING *NAUSEA AND VOMITING THAT IS NOT CONTROLLED WITH YOUR NAUSEA MEDICATION *UNUSUAL SHORTNESS OF BREATH *UNUSUAL BRUISING OR BLEEDING *URINARY PROBLEMS (pain or burning when urinating, or frequent urination) *BOWEL PROBLEMS (unusual diarrhea, constipation, pain near the anus) TENDERNESS IN MOUTH AND THROAT WITH OR WITHOUT PRESENCE OF ULCERS (sore throat, sores in mouth, or a toothache) UNUSUAL RASH, SWELLING OR PAIN  UNUSUAL VAGINAL DISCHARGE OR ITCHING   Items with * indicate a potential emergency and should be followed up as soon as possible or go to the Emergency Department if any problems should occur.  Please show the CHEMOTHERAPY ALERT CARD or IMMUNOTHERAPY ALERT CARD at check-in to  the Emergency Department and triage nurse.  Should you have questions after your visit or need to cancel or reschedule your appointment, please contact [facility] AT [place] ONCOLOG[facility]  [phone] and follow the prompts.  Office hours are 8:00 a.m. to 4:30 p.m. Monday - Friday. Please note that voicemails left after 4:00 p.m. may not be returned until the following business day.  We are closed weekends and major holidays. You have access to a nurse at all times for urgent questions. Please call the main number to the clinic [phone] and follow the prompts.  For any non-urgent questions, you may also contact your provider using MyChart. We now offer e-Visits for anyone 18 and older to request care online for non-urgent symptoms. For details visit [email or web address].   Also download the MyChart app! Go to the app store, search "MyChart", open the app, select [facility], and log in with your MyChart username and password.  Masks are optional in the cancer centers. If you would like for your care team to wear a mask while they are taking care of you, please let them know. For doctor visits, patients may have with them one support person who is at least 86 years old. At this time, visitors are not allowed in the infusion area.   

## 2022-02-04 ENCOUNTER — Encounter: Payer: Self-pay | Admitting: Internal Medicine

## 2022-02-04 ENCOUNTER — Other Ambulatory Visit: Payer: Self-pay | Admitting: Oncology

## 2022-02-05 ENCOUNTER — Other Ambulatory Visit: Payer: Self-pay

## 2022-02-05 ENCOUNTER — Telehealth: Payer: Self-pay | Admitting: Oncology

## 2022-02-05 MED ORDER — LOSARTAN POTASSIUM 100 MG PO TABS: 100.0000 mg | ORAL_TABLET | Freq: Every day | ORAL | 2 refills | Status: DC

## 2022-02-05 NOTE — Telephone Encounter (Signed)
pt daughter called in to confirm changes to appt.[email or web address] [email or web address]

## 2022-02-10 ENCOUNTER — Other Ambulatory Visit: Payer: Self-pay | Admitting: *Deleted

## 2022-02-10 ENCOUNTER — Encounter (INDEPENDENT_AMBULATORY_CARE_PROVIDER_SITE_OTHER): Payer: Self-pay | Admitting: Nurse Practitioner

## 2022-02-10 ENCOUNTER — Encounter: Payer: Self-pay | Admitting: Internal Medicine

## 2022-02-10 DIAGNOSIS — C9000 Multiple myeloma not having achieved remission: Secondary | ICD-10-CM

## 2022-02-10 NOTE — Progress Notes (Signed)
Subjective:    Patient ID: Stephanie Delacruz, female    DOB: 19-Jan-1934, 88 y.o.   MRN: [id number] Chief Complaint  Patient presents with   New Patient (Initial Visit)    Ref Nicki Reaper consult given unable to appreciate distal pulse.lower extremity swelling    Stephanie Delacruz is an 88 year old female who presents today for evaluation from her primary care provider, Dr. Nicki Reaper, relation to not being able to palpate pedal pulses.  The patient also has issues with lower extremity edema.  She notes that her ankles have been swelling for months.  She does elevate her lower extremities but does not utilize compression.  She currently denies any claudication-like symptoms, rest pain or open wounds or ulcerations.  She currently denies any fevers or chills.  Today noninvasive study showed noncompressible ABIs bilaterally with normal TBI's.  Patient has strong triphasic tibial artery waveforms bilaterally with good toe waveforms.    Review of Systems  Cardiovascular:  Positive for leg swelling.  All other systems reviewed and are negative.      Objective:   Physical Exam Vitals reviewed.  HENT:     Head: Normocephalic.  Cardiovascular:     Rate and Rhythm: Normal rate.     Pulses:          Dorsalis pedis pulses are detected w/ Doppler on the right side and detected w/ Doppler on the left side.       Posterior tibial pulses are detected w/ Doppler on the right side and detected w/ Doppler on the left side.  Pulmonary:     Effort: Pulmonary effort is normal.  Musculoskeletal:     Right lower leg: Edema present.     Left lower leg: Edema present.  Skin:    General: Skin is warm and dry.  Neurological:     Mental Status: She is alert and oriented to person, place, and time.  Psychiatric:        Mood and Affect: Mood normal.        Behavior: Behavior normal.        Thought Content: Thought content normal.        Judgment: Judgment normal.     BP (!) 151/72 (BP Location: Left Arm)   Pulse 65    Resp 16   Wt 141 lb 12.8 oz (64.3 kg)   BMI 25.94 kg/m   Past Medical History:  Diagnosis Date   Anemia    GERD (gastroesophageal reflux disease)    Hypercholesterolemia    Hypertension    Renal insufficiency     Social History   Socioeconomic History   Marital status: Married    Spouse name: Not on file   Number of children: Not on file   Years of education: Not on file   Highest education level: Not on file  Occupational History   Not on file  Tobacco Use   Smoking status: Never   Smokeless tobacco: Never  Substance and Sexual Activity   Alcohol use: No   Drug use: No   Sexual activity: Not on file  Other Topics Concern   Not on file  Social History Narrative   Not on file   Social Determinants of Health   Financial Resource Strain: Not on file  Food Insecurity: Not on file  Transportation Needs: Not on file  Physical Activity: Not on file  Stress: Not on file  Social Connections: Not on file  Intimate Partner Violence: Not on file    Past  Surgical History:  Procedure Laterality Date   TONSILLECTOMY     TUBAL LIGATION      Family History  Problem Relation Age of Onset   Hypertension Mother    Arthritis Mother    Hypertension Father    Stroke Father    Breast cancer Niece     No Known Allergies     Latest Ref Rng & Units 01/31/2022    8:06 AM 01/17/2022    8:36 AM 01/03/2022   12:43 PM  CBC  WBC 4.0 - 10.5 K/uL 5.3  4.5  3.9   Hemoglobin 12.0 - 15.0 g/dL 9.6  9.7  9.8   Hematocrit 36.0 - 46.0 % 29.7  30.4  30.6   Platelets 150 - 400 K/uL 172  248  190       CMP     Component Value Date/Time   NA 138 01/31/2022 0806   K 3.6 01/31/2022 0806   CL 106 01/31/2022 0806   CO2 25 01/31/2022 0806   GLUCOSE 91 01/31/2022 0806   BUN 15 01/31/2022 0806   CREATININE 1.00 01/31/2022 0806   CALCIUM 8.3 (L) 01/31/2022 0806   PROT 6.1 (L) 01/31/2022 0806   ALBUMIN 3.2 (L) 01/31/2022 0806   AST 22 01/31/2022 0806   ALT 15 01/31/2022 0806    ALKPHOS 43 01/31/2022 0806   BILITOT 0.7 01/31/2022 0806   GFRNONAA 55 (L) 01/31/2022 0806     VAS [place] ABI WITH/WO TBI  Result Date: 02/03/2022  LOWER EXTREMITY DOPPLER STUDY Patient Name:  Stephanie Delacruz  Date of Exam:   01/29/2022 Medical Rec #: [id number]     Accession #:    [id number] Date of Birth: Jul 10, 1934      Patient Gender: F Patient Age:   24 years Exam Location:  [facility] Procedure:      VAS [place] ABI WITH/WO TBI Referring Phys: Hortencia Pilar --------------------------------------------------------------------------------  Indications: Pulses not felt at the level of the ankle on the Right.  Performing Technologist: Almira Coaster RVS  Examination Guidelines: A complete evaluation includes at minimum, Doppler waveform signals and systolic blood pressure reading at the level of bilateral brachial, anterior tibial, and posterior tibial arteries, when vessel segments are accessible. Bilateral testing is considered an integral part of a complete examination. Photoelectric Plethysmograph (PPG) waveforms and toe systolic pressure readings are included as required and additional duplex testing as needed. Limited examinations for reoccurring indications may be performed as noted.  ABI Findings: +---------+------------------+-----+---------+--------+ Right    Rt Pressure (mmHg)IndexWaveform Comment  +---------+------------------+-----+---------+--------+ Brachial 153                                      +---------+------------------+-----+---------+--------+ ATA      238               1.56 triphasicNC       +---------+------------------+-----+---------+--------+ PTA      159               1.04 triphasic         +---------+------------------+-----+---------+--------+ Great Toe159               1.04 Normal            +---------+------------------+-----+---------+--------+ +---------+------------------+-----+---------+-------+ Left     Lt Pressure  (mmHg)IndexWaveform Comment +---------+------------------+-----+---------+-------+ Brachial 152                                     +---------+------------------+-----+---------+-------+  ATA      240               1.57 triphasicNC      +---------+------------------+-----+---------+-------+ PTA      169               1.10 triphasic        +---------+------------------+-----+---------+-------+ Great Toe179               1.17 Normal           +---------+------------------+-----+---------+-------+ +-------+-----------+-----------+------------+------------+ ABI/TBIToday's ABIToday's TBIPrevious ABIPrevious TBI +-------+-----------+-----------+------------+------------+ Right  >1.0 [place]    1.04                                +-------+-----------+-----------+------------+------------+ Left   >1.0 [place]    1.17                                +-------+-----------+-----------+------------+------------+  Summary: Right: Resting right ankle-brachial index indicates noncompressible right lower extremity arteries. The right toe-brachial index is normal. Left: Resting left ankle-brachial index indicates noncompressible left lower extremity arteries. The left toe-brachial index is normal.  *See table(s) above for measurements and observations.  Electronically signed by Hortencia Pilar MD on 02/03/2022 at 5:17:39 PM.    Final        Assessment & Plan:   1. Absent pulse in lower extremity Today the patient's absent pulses likely result of not being able to palpate due to edema as well as some atherosclerosis.  The patient has noncompressible vessels however the vessels are widely patent with strong perfusion.  No role for intervention currently. - VAS [place] ABI WITH/WO TBI  2. Swelling of lower extremity We discussed possible causes of lower extremity edema.  We will have the patient return for bilateral venous reflux studies to determine if there is a venous component of swelling.   Otherwise patient should engage in conservative therapy including use of medical grade compression, elevation and activity.  The patient can also be evaluated for possible lymphedema pump.  3. Stage 3a chronic kidney disease ([place]) Likely factoring in patient's lower extremity edema   Current Outpatient Medications on File Prior to Visit  Medication Sig Dispense Refill   acyclovir (ZOVIRAX) 400 MG tablet Take 1 tablet (400 mg total) by mouth 2 (two) times daily. 60 tablet 11   amLODipine (NORVASC) 2.5 MG tablet Take 10 mg by mouth daily.     ascorbic acid (VITAMIN C) 250 MG tablet daily Take 2 tablets every day by oral route.     ascorbic acid (VITAMIN C) 500 MG tablet Take 500 mg by mouth daily.     aspirin 81 MG EC tablet Take 81 mg by mouth daily.     Calcium Carb-Cholecalciferol (CALCIUM 600+D) 600-20 MG-MCG TABS Take 1 tablet by mouth 3 (three) times daily.     dexamethasone (DECADRON) 4 MG tablet Take 5 tablets (20 mg total) by mouth once a week. 60 tablet 0   donepezil (ARICEPT) 10 MG tablet Take 10 mg by mouth daily.     fenofibrate (TRICOR) 48 MG tablet Take 1 tablet (48 mg total) by mouth daily. 30 tablet 3   fexofenadine (ALLEGRA) 180 MG tablet Take by mouth.     hydrochlorothiazide (MICROZIDE) 12.5 MG capsule Take 12.5 mg by mouth daily.     lenalidomide (REVLIMID) 10 MG capsule Take 1 capsule (10  mg total) by mouth daily. Take for 21 days, then hold for 7 days. Repeat every 28 days. 21 capsule 0   magic mouthwash (multi-ingredient) oral suspension Swish and swallow 5-55m four times a day as needed for mouth pain 480 mL 1   magic mouthwash w/lidocaine SOLN Take 5 mLs by mouth 4 (four) times daily as needed for mouth pain. Sig: Swish/Swallow 5-10 ml four times a day as needed. Dispense 480 ml. 1RF 480 mL 1   memantine (NAMENDA) 5 MG tablet Take 5 mg by mouth 2 (two) times daily.     metoprolol succinate (TOPROL-XL) 25 MG 24 hr tablet Take 50 mg by mouth daily.     montelukast  (SINGULAIR) 10 MG tablet Take 1 tablet (10 mg total) by mouth See admin instructions. Take 1 tablet one day before Daratumumab injection. 60 tablet 0   Multiple Vitamins-Minerals (WOMENS MULTIVITAMIN PO) Take 1 tablet by mouth daily.     omeprazole (PRILOSEC) 20 MG capsule TAKE 1 CAPSULE(20 MG) BY MOUTH DAILY 90 capsule 0   potassium chloride (KLOR-CON M) 10 MEQ tablet TAKE 1 TABLET(10 MEQ) BY MOUTH DAILY 30 tablet 2   prochlorperazine (COMPAZINE) 10 MG tablet Take 1 tablet (10 mg total) by mouth every 6 (six) hours as needed (Nausea or vomiting). 30 tablet 1   Calcium Carb-Cholecalciferol (CALCIUM 500 + D PO) Take by mouth.     No current facility-administered medications on file prior to visit.    There are no Patient Instructions on file for this visit. No follow-ups on file.   FKris Hartmann NP

## 2022-02-11 ENCOUNTER — Encounter: Payer: Self-pay | Admitting: Pharmacist

## 2022-02-11 ENCOUNTER — Other Ambulatory Visit: Payer: Self-pay

## 2022-02-11 ENCOUNTER — Encounter: Payer: Self-pay | Admitting: Oncology

## 2022-02-11 MED ORDER — LENALIDOMIDE 10 MG PO CAPS: 10.0000 mg | ORAL_CAPSULE | Freq: Every day | ORAL | 0 refills | Status: DC

## 2022-02-11 MED ORDER — DONEPEZIL HCL 10 MG PO TABS: 10.0000 mg | ORAL_TABLET | Freq: Every day | ORAL | 2 refills | Status: DC

## 2022-02-14 ENCOUNTER — Other Ambulatory Visit: Payer: Self-pay | Admitting: Oncology

## 2022-02-14 ENCOUNTER — Inpatient Hospital Stay (HOSPITAL_BASED_OUTPATIENT_CLINIC_OR_DEPARTMENT_OTHER): Payer: Medicare Other | Admitting: Hospice and Palliative Medicine

## 2022-02-14 ENCOUNTER — Other Ambulatory Visit: Payer: Self-pay

## 2022-02-14 ENCOUNTER — Inpatient Hospital Stay: Payer: Medicare Other

## 2022-02-14 ENCOUNTER — Encounter: Payer: Self-pay | Admitting: Hospice and Palliative Medicine

## 2022-02-14 VITALS — BP 123/61 | HR 58 | Temp 97.8°F | Resp 20 | Ht 62.0 in | Wt 140.5 lb

## 2022-02-14 DIAGNOSIS — E7800 Pure hypercholesterolemia, unspecified: Secondary | ICD-10-CM | POA: Diagnosis not present

## 2022-02-14 DIAGNOSIS — C9000 Multiple myeloma not having achieved remission: Secondary | ICD-10-CM | POA: Diagnosis not present

## 2022-02-14 DIAGNOSIS — I129 Hypertensive chronic kidney disease with stage 1 through stage 4 chronic kidney disease, or unspecified chronic kidney disease: Secondary | ICD-10-CM | POA: Diagnosis not present

## 2022-02-14 DIAGNOSIS — Z79624 Long term (current) use of inhibitors of nucleotide synthesis: Secondary | ICD-10-CM | POA: Diagnosis not present

## 2022-02-14 DIAGNOSIS — M7989 Other specified soft tissue disorders: Secondary | ICD-10-CM | POA: Diagnosis not present

## 2022-02-14 DIAGNOSIS — Z79899 Other long term (current) drug therapy: Secondary | ICD-10-CM | POA: Diagnosis not present

## 2022-02-14 DIAGNOSIS — Z5111 Encounter for antineoplastic chemotherapy: Secondary | ICD-10-CM

## 2022-02-14 DIAGNOSIS — R5383 Other fatigue: Secondary | ICD-10-CM | POA: Diagnosis not present

## 2022-02-14 DIAGNOSIS — Z5112 Encounter for antineoplastic immunotherapy: Secondary | ICD-10-CM | POA: Diagnosis not present

## 2022-02-14 DIAGNOSIS — D649 Anemia, unspecified: Secondary | ICD-10-CM | POA: Diagnosis not present

## 2022-02-14 DIAGNOSIS — Z8249 Family history of ischemic heart disease and other diseases of the circulatory system: Secondary | ICD-10-CM | POA: Diagnosis not present

## 2022-02-14 DIAGNOSIS — Z7952 Long term (current) use of systemic steroids: Secondary | ICD-10-CM | POA: Diagnosis not present

## 2022-02-14 DIAGNOSIS — Z7961 Long term (current) use of immunomodulator: Secondary | ICD-10-CM | POA: Diagnosis not present

## 2022-02-14 DIAGNOSIS — N1831 Chronic kidney disease, stage 3a: Secondary | ICD-10-CM | POA: Diagnosis not present

## 2022-02-14 DIAGNOSIS — Z7982 Long term (current) use of aspirin: Secondary | ICD-10-CM | POA: Diagnosis not present

## 2022-02-14 DIAGNOSIS — Z823 Family history of stroke: Secondary | ICD-10-CM | POA: Diagnosis not present

## 2022-02-14 DIAGNOSIS — Z8261 Family history of arthritis: Secondary | ICD-10-CM | POA: Diagnosis not present

## 2022-02-14 LAB — COMPREHENSIVE METABOLIC PANEL
ALT: 24 U/L (ref 0–44)
AST: 32 U/L (ref 15–41)
Albumin: 3.3 g/dL — ABNORMAL LOW (ref 3.5–5.0)
Alkaline Phosphatase: 47 U/L (ref 38–126)
Anion gap: 4 — ABNORMAL LOW (ref 5–15)
BUN: 14 mg/dL (ref 8–23)
CO2: 24 mmol/L (ref 22–32)
Calcium: 8.9 mg/dL (ref 8.9–10.3)
Chloride: 109 mmol/L (ref 98–111)
Creatinine, Ser: 0.97 mg/dL (ref 0.44–1.00)
GFR, Estimated: 57 mL/min — ABNORMAL LOW (ref >60)
Glucose, Bld: 96 mg/dL (ref 70–99)
Potassium: 4.1 mmol/L (ref 3.5–5.1)
Sodium: 137 mmol/L (ref 135–145)
Total Bilirubin: 0.5 mg/dL (ref 0.3–1.2)
Total Protein: 6.4 g/dL — ABNORMAL LOW (ref 6.5–8.1)

## 2022-02-14 LAB — CBC WITH DIFFERENTIAL/PLATELET
Abs Immature Granulocytes: 0 10*3/uL (ref 0.00–0.07)
Basophils Absolute: 0.1 10*3/uL (ref 0.0–0.1)
Basophils Relative: 3 %
Eosinophils Absolute: 0.1 10*3/uL (ref 0.0–0.5)
Eosinophils Relative: 3 %
HCT: 31.2 % — ABNORMAL LOW (ref 36.0–46.0)
Hemoglobin: 10.1 g/dL — ABNORMAL LOW (ref 12.0–15.0)
Immature Granulocytes: 0 %
Lymphocytes Relative: 21 %
Lymphs Abs: 0.7 10*3/uL (ref 0.7–4.0)
MCH: 31.6 pg (ref 26.0–34.0)
MCHC: 32.4 g/dL (ref 30.0–36.0)
MCV: 97.5 fL (ref 80.0–100.0)
Monocytes Absolute: 0.2 10*3/uL (ref 0.1–1.0)
Monocytes Relative: 8 %
Neutro Abs: 2 10*3/uL (ref 1.7–7.7)
Neutrophils Relative %: 65 %
Platelets: 236 10*3/uL (ref 150–400)
RBC: 3.2 MIL/uL — ABNORMAL LOW (ref 3.87–5.11)
RDW: 14.8 % (ref 11.5–15.5)
WBC: 3.1 10*3/uL — ABNORMAL LOW (ref 4.0–10.5)
nRBC: 0 % (ref 0.0–0.2)

## 2022-02-14 MED ORDER — ACETAMINOPHEN 325 MG PO TABS
650.0000 mg | ORAL_TABLET | Freq: Once | ORAL | Status: AC
  Administered 2022-02-14: 650 mg via ORAL
  Filled 2022-02-14: qty 2

## 2022-02-14 MED ORDER — ZOLEDRONIC ACID 4 MG/5ML IV CONC
3.3000 mg | Freq: Once | INTRAVENOUS | Status: AC
  Administered 2022-02-14: 3.3 mg via INTRAVENOUS
  Filled 2022-02-14: qty 4.13

## 2022-02-14 MED ORDER — DARATUMUMAB-HYALURONIDASE-FIHJ 1800-30000 MG-UT/15ML ~~LOC~~ SOLN
1800.0000 mg | Freq: Once | SUBCUTANEOUS | Status: AC
  Administered 2022-02-14: 1800 mg via SUBCUTANEOUS
  Filled 2022-02-14: qty 15

## 2022-02-14 MED ORDER — DIPHENHYDRAMINE HCL 25 MG PO CAPS
50.0000 mg | ORAL_CAPSULE | Freq: Once | ORAL | Status: AC
  Administered 2022-02-14: 50 mg via ORAL
  Filled 2022-02-14: qty 2

## 2022-02-14 MED ORDER — SODIUM CHLORIDE 0.9 % IV SOLN
Freq: Once | INTRAVENOUS | Status: AC
  Filled 2022-02-14: qty 250

## 2022-02-14 NOTE — Patient Instructions (Signed)
[facility]  Discharge Instructions: Thank you for choosing [facility] to provide your oncology and hematology care.  If you have a lab appointment with the Cancer Center, please go directly to the Cancer Center and check in at the registration area.  Wear comfortable clothing and clothing appropriate for easy access to any Portacath or PICC line.   We strive to give you quality time with your provider. You may need to reschedule your appointment if you arrive late (15 or more minutes).  Arriving late affects you and other patients whose appointments are after yours.  Also, if you miss three or more appointments without notifying the office, you may be dismissed from the clinic at the provider's discretion.      For prescription refill requests, have your pharmacy contact our office and allow 72 hours for refills to be completed.    Today you received the following chemotherapy and/or immunotherapy agents Darzalex      To help prevent nausea and vomiting after your treatment, we encourage you to take your nausea medication as directed.  BELOW ARE SYMPTOMS THAT SHOULD BE REPORTED IMMEDIATELY: *FEVER GREATER THAN 100.4 F (38 C) OR HIGHER *CHILLS OR SWEATING *NAUSEA AND VOMITING THAT IS NOT CONTROLLED WITH YOUR NAUSEA MEDICATION *UNUSUAL SHORTNESS OF BREATH *UNUSUAL BRUISING OR BLEEDING *URINARY PROBLEMS (pain or burning when urinating, or frequent urination) *BOWEL PROBLEMS (unusual diarrhea, constipation, pain near the anus) TENDERNESS IN MOUTH AND THROAT WITH OR WITHOUT PRESENCE OF ULCERS (sore throat, sores in mouth, or a toothache) UNUSUAL RASH, SWELLING OR PAIN  UNUSUAL VAGINAL DISCHARGE OR ITCHING   Items with * indicate a potential emergency and should be followed up as soon as possible or go to the Emergency Department if any problems should occur.  Please show the CHEMOTHERAPY ALERT CARD or IMMUNOTHERAPY ALERT CARD at check-in to  the Emergency Department and triage nurse.  Should you have questions after your visit or need to cancel or reschedule your appointment, please contact [facility]  [phone] and follow the prompts.  Office hours are 8:00 a.m. to 4:30 p.m. Monday - Friday. Please note that voicemails left after 4:00 p.m. may not be returned until the following business day.  We are closed weekends and major holidays. You have access to a nurse at all times for urgent questions. Please call the main number to the clinic [phone] and follow the prompts.  For any non-urgent questions, you may also contact your provider using MyChart. We now offer e-Visits for anyone 18 and older to request care online for non-urgent symptoms. For details visit [email or web address].   Also download the MyChart app! Go to the app store, search "MyChart", open the app, select , and log in with your MyChart username and password.  Masks are optional in the cancer centers. If you would like for your care team to wear a mask while they are taking care of you, please let them know. For doctor visits, patients may have with them one support person who is at least 86 years old. At this time, visitors are not allowed in the infusion area.   

## 2022-02-14 NOTE — Progress Notes (Signed)
Patient monitored x 20 minutes post Darzalex injection. Patient tolerated treatment well.

## 2022-02-14 NOTE — Progress Notes (Signed)
Hematology/Oncology Progress note [phone] [phone]         Patient Care Team: Einar Pheasant, MD as PCP - General (Internal Medicine)  ASSESSMENT & PLAN:   Multiple myeloma [facility]) Multiple myeloma, IgA kappa Labs reviewed and discussed with patient and daughter Discussed with Dr. Tasia Catchings and okay to proceed with this cycle of D1 Daratumumab.  Patient on  Revlimid D1-21 Patient takes dexamethasone 20 mg weekly Patient will come back in 4 weeks for Daratumumab treatments. Continue asprin 44m daily and Acyclovir 4011mBID.    Bone lesion Plan Zometa monthly. Recommend calcium 1200 mg daily.   Encounter for antineoplastic chemotherapy Chemotherapy plan as listed as above.   Normocytic anemia Hemoglobin 10.1 Continue to monitor.    Stage 3a chronic kidney disease (HCC) Normal BUN and creatinine today Recommend patient to avoid nephrotoxins.  Encourage oral hydration.   Swelling of lower extremity No evidence of DVT on Doppler 6/30.  Continue conservative measures  Case and plan discussed with Dr. YuTasia CatchingsFollow up  Lab/MD/Dara/Zometa in 4 weeks  All questions were answered. The patient knows to call the clinic with any problems, questions or concerns.  JoAltha HarmPhD, NP-C [facility] 02/14/2022     CHIEF COMPLAINTS/REASON FOR VISIT:  Follow-up for multiple myeloma  HISTORY OF PRESENTING ILLNESS:   Stephanie Delacruz a  [age].o.  female with PMH listed below was seen in consultation at the request of  ScEinar PheasantMD  for evaluation of anemia 07/15/2021, patient had CBC checked at the primary care provider's office.  CBC showed hemoglobin 7.7, hematocrit 23.9, white count 3.6, RDW 16.5.  Platelet count 166. Reviewed previous lab records.  Patient has chronic anemia dating back at least 2016. Her baseline prior to 2018 was about 10.   Hemoglobin was 9.1 in March 2022, and further dropped to 8.1 in October 2022, then  November dropped to 7.7. Patient denies being significantly affected, tired, lightheadedness, shortness of breath.  Appetite is fair.  Denies any black or bloody stool.   Denies any history of hemoglobinopathy  08/26/2021 bone marrow biopsy showed hypercellular marrow with plasma cell neoplasm.  67% plasma cells in the aspirate.  Cytogenetics showed complex hyperplasia, gain of 1 q.  # 08/26/2021 started on Daratumumab+ dexamethasone # 09/06/2021 added Revlimid 1011m 14 days.  #09/27/2021, cycle 2 Revlimid 10 mg for 21 days.  INTERVAL HISTORY Stephanie Delacruz a [age]o. female who has above history reviewed by me today presents for follow up visit for management of multiple myeloma Patient tolerates daratumumab -RD regimen well. She was accompanied by her daughter today.  Patient denies any significant symptomatic complaints, changes, or concerns.  She reports she is doing well.   Review of Systems  Constitutional:  Positive for fatigue. Negative for appetite change, chills and fever.  HENT:   Negative for hearing loss and voice change.   Eyes:  Negative for eye problems.  Respiratory:  Negative for chest tightness and cough.   Cardiovascular:  Negative for chest pain.  Gastrointestinal:  Negative for abdominal distention, abdominal pain and blood in stool.  Endocrine: Negative for hot flashes.  Genitourinary:  Negative for difficulty urinating and frequency.   Musculoskeletal:  Negative for arthralgias and back pain.  Skin:  Negative for itching and rash.  Neurological:  Negative for extremity weakness.  Hematological:  Negative for adenopathy.  Psychiatric/Behavioral:  Negative for confusion.     MEDICAL HISTORY:  Past Medical  History:  Diagnosis Date   Anemia    GERD (gastroesophageal reflux disease)    Hypercholesterolemia    Hypertension    Renal insufficiency     SURGICAL HISTORY: Past Surgical History:  Procedure Laterality Date   TONSILLECTOMY     TUBAL LIGATION       SOCIAL HISTORY: Social History   Socioeconomic History   Marital status: Married    Spouse name: Not on file   Number of children: Not on file   Years of education: Not on file   Highest education level: Not on file  Occupational History   Not on file  Tobacco Use   Smoking status: Never   Smokeless tobacco: Never  Substance and Sexual Activity   Alcohol use: No   Drug use: No   Sexual activity: Not on file  Other Topics Concern   Not on file  Social History Narrative   Not on file   Social Determinants of Health   Financial Resource Strain: Not on file  Food Insecurity: Not on file  Transportation Needs: Not on file  Physical Activity: Not on file  Stress: Not on file  Social Connections: Not on file  Intimate Partner Violence: Not on file    FAMILY HISTORY: Family History  Problem Relation Age of Onset   Hypertension Mother    Arthritis Mother    Hypertension Father    Stroke Father    Breast cancer Niece     ALLERGIES:  has No Known Allergies.  MEDICATIONS:  Current Outpatient Medications  Medication Sig Dispense Refill   acyclovir (ZOVIRAX) 400 MG tablet Take 1 tablet (400 mg total) by mouth 2 (two) times daily. 60 tablet 11   amLODipine (NORVASC) 2.5 MG tablet Take 10 mg by mouth daily.     ascorbic acid (VITAMIN C) 250 MG tablet daily Take 2 tablets every day by oral route.     aspirin 81 MG EC tablet Take 81 mg by mouth daily.     Calcium Carb-Cholecalciferol (CALCIUM 600+D) 600-20 MG-MCG TABS Take 1 tablet by mouth 3 (three) times daily.     dexamethasone (DECADRON) 4 MG tablet Take 5 tablets (20 mg total) by mouth once a week. 60 tablet 0   donepezil (ARICEPT) 10 MG tablet Take 1 tablet (10 mg total) by mouth daily. 90 tablet 2   fenofibrate (TRICOR) 48 MG tablet Take 1 tablet (48 mg total) by mouth daily. 30 tablet 3   fexofenadine (ALLEGRA) 180 MG tablet Take by mouth.     hydrochlorothiazide (MICROZIDE) 12.5 MG capsule Take 12.5 mg by mouth  daily.     lenalidomide (REVLIMID) 10 MG capsule Take 1 capsule (10 mg total) by mouth daily. Take for 21 days, then hold for 7 days. Repeat every 28 days. 21 capsule 0   memantine (NAMENDA) 5 MG tablet Take 5 mg by mouth 2 (two) times daily.     metoprolol succinate (TOPROL-XL) 25 MG 24 hr tablet Take 50 mg by mouth daily.     montelukast (SINGULAIR) 10 MG tablet Take 1 tablet (10 mg total) by mouth See admin instructions. Take 1 tablet one day before Daratumumab injection. 60 tablet 0   Multiple Vitamins-Minerals (WOMENS MULTIVITAMIN PO) Take 1 tablet by mouth daily.     omeprazole (PRILOSEC) 20 MG capsule TAKE 1 CAPSULE(20 MG) BY MOUTH DAILY 90 capsule 0   losartan (COZAAR) 100 MG tablet Take 1 tablet (100 mg total) by mouth daily. 90 tablet 2   magic  mouthwash (multi-ingredient) oral suspension Swish and swallow 5-15m four times a day as needed for mouth pain (Patient not taking: Reported on 02/14/2022) 480 mL 1   prochlorperazine (COMPAZINE) 10 MG tablet Take 1 tablet (10 mg total) by mouth every 6 (six) hours as needed (Nausea or vomiting). (Patient not taking: Reported on 02/14/2022) 30 tablet 1   No current facility-administered medications for this visit.   Facility-Administered Medications Ordered in Other Visits  Medication Dose Route Frequency Provider Last Rate Last Admin   daratumumab-hyaluronidase-fihj (DARZALEX FASPRO) 1800-30000 MG-UT/15ML chemo SQ injection 1,800 mg  1,800 mg Subcutaneous Once YEarlie Server MD         PHYSICAL EXAMINATION: ECOG PERFORMANCE STATUS: 1 - Symptomatic but completely ambulatory Vitals:   02/14/22 1051  BP: 123/61  Pulse: (!) 58  Resp: 20  Temp: 97.8 F (36.6 C)   Filed Weights   02/14/22 1051  Weight: 140 lb 8 oz (63.7 kg)    Physical Exam Constitutional:      General: She is not in acute distress. HENT:     Head: Normocephalic and atraumatic.  Eyes:     General: No scleral icterus. Cardiovascular:     Rate and Rhythm: Normal rate and  regular rhythm.     Heart sounds: Normal heart sounds.  Pulmonary:     Effort: Pulmonary effort is normal. No respiratory distress.     Breath sounds: No wheezing.  Abdominal:     General: Bowel sounds are normal. There is no distension.     Palpations: Abdomen is soft.  Musculoskeletal:        General: No deformity. Normal range of motion.     Cervical back: Normal range of motion and neck supple.  Skin:    General: Skin is warm and dry.     Findings: No erythema or rash.  Neurological:     Mental Status: She is alert and oriented to person, place, and time. Mental status is at baseline.     Cranial Nerves: No cranial nerve deficit.     Coordination: Coordination normal.  Psychiatric:        Mood and Affect: Mood normal.     LABORATORY DATA:  I have reviewed the data as listed    Latest Ref Rng & Units 02/14/2022   10:23 AM 01/31/2022    8:06 AM 01/17/2022    8:36 AM  CBC  WBC 4.0 - 10.5 K/uL 3.1  5.3  4.5   Hemoglobin 12.0 - 15.0 g/dL 10.1  9.6  9.7   Hematocrit 36.0 - 46.0 % 31.2  29.7  30.4   Platelets 150 - 400 K/uL 236  172  248       Latest Ref Rng & Units 02/14/2022   10:23 AM 01/31/2022    8:06 AM 01/17/2022    8:36 AM  CMP  Glucose 70 - 99 mg/dL 96  91  85   BUN 8 - 23 mg/dL '14  15  19   ' Creatinine 0.44 - 1.00 mg/dL 0.97  1.00  0.91   Sodium 135 - 145 mmol/L 137  138  139   Potassium 3.5 - 5.1 mmol/L 4.1  3.6  3.9   Chloride 98 - 111 mmol/L 109  106  107   CO2 22 - 32 mmol/L '24  25  24   ' Calcium 8.9 - 10.3 mg/dL 8.9  8.3  8.6   Total Protein 6.5 - 8.1 g/dL 6.4  6.1  6.6   Total Bilirubin  0.3 - 1.2 mg/dL 0.5  0.7  0.4   Alkaline Phos 38 - 126 U/L 47  43  47   AST 15 - 41 U/L 32  22  23   ALT 0 - 44 U/L '24  15  17      ' Iron/TIBC/Ferritin/ %Sat    Component Value Date/Time   IRON 54 07/24/2021 1212   TIBC 277 07/24/2021 1212   FERRITIN 93 07/24/2021 1212   IRONPCTSAT 20 07/24/2021 1212       RADIOGRAPHIC STUDIES: I have personally reviewed the  radiological images as listed and agreed with the findings in the report. VAS [place] ABI WITH/WO TBI  Result Date: 02/03/2022  LOWER EXTREMITY DOPPLER STUDY Patient Name:  TAYLYN BRAME  Date of Exam:   01/29/2022 Medical Rec #: [id number]     Accession #:    [id number] Date of Birth: [birth year]      Patient Gender: F Patient Age:   88 years Exam Location:  [facility] Procedure:      VAS [place] ABI WITH/WO TBI Referring Phys: Hortencia Pilar --------------------------------------------------------------------------------  Indications: Pulses not felt at the level of the ankle on the Right.  Performing Technologist: Almira Coaster RVS  Examination Guidelines: A complete evaluation includes at minimum, Doppler waveform signals and systolic blood pressure reading at the level of bilateral brachial, anterior tibial, and posterior tibial arteries, when vessel segments are accessible. Bilateral testing is considered an integral part of a complete examination. Photoelectric Plethysmograph (PPG) waveforms and toe systolic pressure readings are included as required and additional duplex testing as needed. Limited examinations for reoccurring indications may be performed as noted.  ABI Findings: +---------+------------------+-----+---------+--------+ Right    Rt Pressure (mmHg)IndexWaveform Comment  +---------+------------------+-----+---------+--------+ Brachial 153                                      +---------+------------------+-----+---------+--------+ ATA      238               1.56 triphasicNC       +---------+------------------+-----+---------+--------+ PTA      159               1.04 triphasic         +---------+------------------+-----+---------+--------+ Great Toe159               1.04 Normal            +---------+------------------+-----+---------+--------+ +---------+------------------+-----+---------+-------+ Left     Lt Pressure (mmHg)IndexWaveform Comment  +---------+------------------+-----+---------+-------+ Brachial 152                                     +---------+------------------+-----+---------+-------+ ATA      240               1.57 triphasicNC      +---------+------------------+-----+---------+-------+ PTA      169               1.10 triphasic        +---------+------------------+-----+---------+-------+ Great Toe179               1.17 Normal           +---------+------------------+-----+---------+-------+ +-------+-----------+-----------+------------+------------+ ABI/TBIToday's ABIToday's TBIPrevious ABIPrevious TBI +-------+-----------+-----------+------------+------------+ Right  >1.0 [place]    1.04                                +-------+-----------+-----------+------------+------------+  Left   >1.0     1.17                                +-------+-----------+-----------+------------+------------+  Summary: Right: Resting right ankle-brachial index indicates noncompressible right lower extremity arteries. The right toe-brachial index is normal. Left: Resting left ankle-brachial index indicates noncompressible left lower extremity arteries. The left toe-brachial index is normal.  *See table(s) above for measurements and observations.  Electronically signed by Hortencia Pilar MD on 02/03/2022 at 5:17:39 PM.    Final    US Venous Img Lower Bilateral  Result Date: 01/24/2022 CLINICAL DATA:  Lower extremity edema EXAM: BILATERAL LOWER EXTREMITY VENOUS DOPPLER ULTRASOUND TECHNIQUE: Gray-scale sonography with graded compression, as well as color Doppler and duplex ultrasound were performed to evaluate the lower extremity deep venous systems from the level of the common femoral vein and including the common femoral, femoral, profunda femoral, popliteal and calf veins including the posterior tibial, peroneal and gastrocnemius veins when visible. The superficial great saphenous vein was also interrogated. Spectral  Doppler was utilized to evaluate flow at rest and with distal augmentation maneuvers in the common femoral, femoral and popliteal veins. COMPARISON:  None Available. FINDINGS: RIGHT LOWER EXTREMITY Common Femoral Vein: No evidence of thrombus. Normal compressibility, respiratory phasicity and response to augmentation. Saphenofemoral Junction: No evidence of thrombus. Normal compressibility and flow on color Doppler imaging. Profunda Femoral Vein: No evidence of thrombus. Normal compressibility and flow on color Doppler imaging. Femoral Vein: No evidence of thrombus. Normal compressibility, respiratory phasicity and response to augmentation. Popliteal Vein: No evidence of thrombus. Normal compressibility, respiratory phasicity and response to augmentation. Calf Veins: No evidence of thrombus. Normal compressibility and flow on color Doppler imaging. Superficial Great Saphenous Vein: No evidence of thrombus. Normal compressibility. Venous Reflux:  None. Other Findings:  None. LEFT LOWER EXTREMITY Common Femoral Vein: No evidence of thrombus. Normal compressibility, respiratory phasicity and response to augmentation. Saphenofemoral Junction: No evidence of thrombus. Normal compressibility and flow on color Doppler imaging. Profunda Femoral Vein: No evidence of thrombus. Normal compressibility and flow on color Doppler imaging. Femoral Vein: No evidence of thrombus. Normal compressibility, respiratory phasicity and response to augmentation. Popliteal Vein: No evidence of thrombus. Normal compressibility, respiratory phasicity and response to augmentation. Calf Veins: No evidence of thrombus. Normal compressibility and flow on color Doppler imaging. Superficial Great Saphenous Vein: No evidence of thrombus. Normal compressibility. Venous Reflux:  None. Other Findings:  None. IMPRESSION: No evidence of deep venous thrombosis in either lower extremity. Electronically Signed   By: Jacqulynn Cadet M.D.   On: 01/24/2022  16:37

## 2022-02-17 ENCOUNTER — Other Ambulatory Visit: Payer: Self-pay

## 2022-02-17 LAB — KAPPA/LAMBDA LIGHT CHAINS
Kappa free light chain: 23.9 mg/L — ABNORMAL HIGH (ref 3.3–19.4)
Kappa, lambda light chain ratio: 3.37 — ABNORMAL HIGH (ref 0.26–1.65)
Lambda free light chains: 7.1 mg/L (ref 5.7–26.3)

## 2022-02-19 LAB — MULTIPLE MYELOMA PANEL, SERUM
Albumin SerPl Elph-Mcnc: 3.1 g/dL (ref 2.9–4.4)
Albumin/Glob SerPl: 1.1 (ref 0.7–1.7)
Alpha 1: 0.3 g/dL (ref 0.0–0.4)
Alpha2 Glob SerPl Elph-Mcnc: 0.8 g/dL (ref 0.4–1.0)
B-Globulin SerPl Elph-Mcnc: 1.4 g/dL — ABNORMAL HIGH (ref 0.7–1.3)
Gamma Glob SerPl Elph-Mcnc: 0.4 g/dL (ref 0.4–1.8)
Globulin, Total: 2.9 g/dL (ref 2.2–3.9)
IgA: 480 mg/dL — ABNORMAL HIGH (ref 64–422)
IgG (Immunoglobin G), Serum: 494 mg/dL — ABNORMAL LOW (ref 586–1602)
IgM (Immunoglobulin M), Srm: 11 mg/dL — ABNORMAL LOW (ref 26–217)
M Protein SerPl Elph-Mcnc: 0.7 g/dL — ABNORMAL HIGH
Total Protein ELP: 6 g/dL (ref 6.0–8.5)

## 2022-02-21 ENCOUNTER — Other Ambulatory Visit: Payer: Self-pay

## 2022-02-24 ENCOUNTER — Other Ambulatory Visit: Payer: Self-pay | Admitting: Internal Medicine

## 2022-02-25 ENCOUNTER — Other Ambulatory Visit: Payer: Self-pay

## 2022-02-26 ENCOUNTER — Encounter (INDEPENDENT_AMBULATORY_CARE_PROVIDER_SITE_OTHER): Payer: Medicare Other

## 2022-02-26 ENCOUNTER — Other Ambulatory Visit: Payer: Self-pay

## 2022-02-26 ENCOUNTER — Ambulatory Visit (INDEPENDENT_AMBULATORY_CARE_PROVIDER_SITE_OTHER): Payer: Medicare Other | Admitting: Nurse Practitioner

## 2022-03-03 ENCOUNTER — Ambulatory Visit (INDEPENDENT_AMBULATORY_CARE_PROVIDER_SITE_OTHER): Payer: Medicare Other | Admitting: Internal Medicine

## 2022-03-03 ENCOUNTER — Encounter: Payer: Self-pay | Admitting: Internal Medicine

## 2022-03-03 VITALS — BP 136/84 | HR 68 | Temp 98.4°F | Resp 17 | Ht 62.0 in | Wt 140.4 lb

## 2022-03-03 DIAGNOSIS — C9000 Multiple myeloma not having achieved remission: Secondary | ICD-10-CM

## 2022-03-03 DIAGNOSIS — D649 Anemia, unspecified: Secondary | ICD-10-CM | POA: Diagnosis not present

## 2022-03-03 DIAGNOSIS — E2839 Other primary ovarian failure: Secondary | ICD-10-CM

## 2022-03-03 DIAGNOSIS — N1831 Chronic kidney disease, stage 3a: Secondary | ICD-10-CM

## 2022-03-03 DIAGNOSIS — I10 Essential (primary) hypertension: Secondary | ICD-10-CM | POA: Diagnosis not present

## 2022-03-03 DIAGNOSIS — M545 Low back pain, unspecified: Secondary | ICD-10-CM

## 2022-03-03 DIAGNOSIS — M7989 Other specified soft tissue disorders: Secondary | ICD-10-CM

## 2022-03-03 DIAGNOSIS — E7800 Pure hypercholesterolemia, unspecified: Secondary | ICD-10-CM

## 2022-03-03 NOTE — Progress Notes (Signed)
Patient ID: Stephanie Delacruz, female   DOB: 07-07-1934, 88 y.o.   MRN: [id number]   Subjective:    Patient ID: Stephanie Delacruz, female    DOB: November 22, 1933, 88 y.o.   MRN: [id number]   Patient here for a scheduled follow up.   Marland Kitchen   HPI Here to follow up regarding her blood pressure and cholesterol.  Recently diagnosed with multiple myeloma.  Seeing hematology - being treated - on revlimid and dexamethasone.  Zometa monthly.  Reports doing relatively well.  No chest pain.  Breathing stable.  Having some low back pain and right knee pain.  Discussed.  No abdominal pain.  Bowels moving. Still some swelling.  Seeing [facility].  Walking more.    Past Medical History:  Diagnosis Date   Anemia    GERD (gastroesophageal reflux disease)    Hypercholesterolemia    Hypertension    Renal insufficiency    Past Surgical History:  Procedure Laterality Date   TONSILLECTOMY     TUBAL LIGATION     Family History  Problem Relation Age of Onset   Hypertension Mother    Arthritis Mother    Hypertension Father    Stroke Father    Breast cancer Niece    Social History   Socioeconomic History   Marital status: Married    Spouse name: Not on file   Number of children: Not on file   Years of education: Not on file   Highest education level: Not on file  Occupational History   Not on file  Tobacco Use   Smoking status: Never   Smokeless tobacco: Never  Substance and Sexual Activity   Alcohol use: No   Drug use: No   Sexual activity: Not on file  Other Topics Concern   Not on file  Social History Narrative   Not on file   Social Determinants of Health   Financial Resource Strain: Not on file  Food Insecurity: Not on file  Transportation Needs: Not on file  Physical Activity: Not on file  Stress: Not on file  Social Connections: Not on file     Review of Systems  Constitutional:  Negative for appetite change and unexpected weight change.  HENT:  Negative for congestion and sinus pressure.    Respiratory:  Negative for cough, chest tightness and shortness of breath.   Cardiovascular:  Negative for chest pain, palpitations and leg swelling.  Gastrointestinal:  Negative for abdominal pain, diarrhea, nausea and vomiting.  Genitourinary:  Negative for difficulty urinating and dysuria.  Musculoskeletal:  Positive for back pain. Negative for myalgias.  Skin:  Negative for color change and rash.  Neurological:  Negative for dizziness, light-headedness and headaches.  Psychiatric/Behavioral:  Negative for agitation and dysphoric mood.        Objective:     BP 136/84 (BP Location: Left Arm, Patient Position: Sitting, Cuff Size: Small)   Pulse 68   Temp 98.4 F (36.9 C) (Temporal)   Resp 17   Ht $R'5\' 2"'Fm$  (1.575 m)   Wt 140 lb 6.4 oz (63.7 kg)   SpO2 96%   BMI 25.68 kg/m  Wt Readings from Last 3 Encounters:  03/05/22 140 lb (63.5 kg)  03/03/22 140 lb 6.4 oz (63.7 kg)  02/14/22 140 lb 8 oz (63.7 kg)    Physical Exam Vitals reviewed.  Constitutional:      General: She is not in acute distress.    Appearance: Normal appearance.  HENT:     Head:  Normocephalic and atraumatic.     Right Ear: External ear normal.     Left Ear: External ear normal.  Eyes:     General: No scleral icterus.       Right eye: No discharge.        Left eye: No discharge.     Conjunctiva/sclera: Conjunctivae normal.  Neck:     Thyroid: No thyromegaly.  Cardiovascular:     Rate and Rhythm: Normal rate and regular rhythm.  Pulmonary:     Effort: No respiratory distress.     Breath sounds: Normal breath sounds. No wheezing.  Abdominal:     General: Bowel sounds are normal.     Palpations: Abdomen is soft.     Tenderness: There is no abdominal tenderness.  Musculoskeletal:        General: No tenderness.     Cervical back: Neck supple. No tenderness.     Comments: Swelling - stable.   Lymphadenopathy:     Cervical: No cervical adenopathy.  Skin:    Findings: No erythema or rash.   Neurological:     Mental Status: She is alert.  Psychiatric:        Mood and Affect: Mood normal.        Behavior: Behavior normal.      Outpatient Encounter Medications as of 03/03/2022  Medication Sig   acyclovir (ZOVIRAX) 400 MG tablet Take 1 tablet (400 mg total) by mouth 2 (two) times daily.   amLODipine (NORVASC) 2.5 MG tablet Take 5 mg by mouth daily.   ascorbic acid (VITAMIN C) 250 MG tablet daily Take 2 tablets every day by oral route.   aspirin 81 MG EC tablet Take 81 mg by mouth daily.   Calcium Carb-Cholecalciferol (CALCIUM 600+D) 600-20 MG-MCG TABS Take 1 tablet by mouth 3 (three) times daily.   dexamethasone (DECADRON) 4 MG tablet Take 5 tablets (20 mg total) by mouth once a week.   donepezil (ARICEPT) 10 MG tablet Take 1 tablet (10 mg total) by mouth daily.   fenofibrate (TRICOR) 48 MG tablet Take 1 tablet (48 mg total) by mouth daily.   fexofenadine (ALLEGRA) 180 MG tablet Take by mouth.   hydrochlorothiazide (MICROZIDE) 12.5 MG capsule Take 12.5 mg by mouth daily.   lenalidomide (REVLIMID) 10 MG capsule Take 1 capsule (10 mg total) by mouth daily. Take for 21 days, then hold for 7 days. Repeat every 28 days.   losartan (COZAAR) 100 MG tablet Take 1 tablet (100 mg total) by mouth daily.   magic mouthwash (multi-ingredient) oral suspension Swish and swallow 5-53ml four times a day as needed for mouth pain   memantine (NAMENDA) 5 MG tablet Take 5 mg by mouth 2 (two) times daily.   metoprolol succinate (TOPROL-XL) 25 MG 24 hr tablet Take 50 mg by mouth daily.   montelukast (SINGULAIR) 10 MG tablet Take 1 tablet (10 mg total) by mouth See admin instructions. Take 1 tablet one day before Daratumumab injection.   Multiple Vitamins-Minerals (WOMENS MULTIVITAMIN PO) Take 1 tablet by mouth daily.   omeprazole (PRILOSEC) 20 MG capsule TAKE 1 CAPSULE(20 MG) BY MOUTH DAILY   prochlorperazine (COMPAZINE) 10 MG tablet Take 1 tablet (10 mg total) by mouth every 6 (six) hours as needed  (Nausea or vomiting).   No facility-administered encounter medications on file as of 03/03/2022.     Lab Results  Component Value Date   WBC 3.1 (L) 02/14/2022   HGB 10.1 (L) 02/14/2022   HCT 31.2 (L)  02/14/2022   PLT 236 02/14/2022   GLUCOSE 96 02/14/2022   CHOL 115 06/27/2021   TRIG 34 06/27/2021   HDL 51 06/27/2021   LDLCALC 57 06/27/2021   ALT 24 02/14/2022   AST 32 02/14/2022   NA 137 02/14/2022   K 4.1 02/14/2022   CL 109 02/14/2022   CREATININE 0.97 02/14/2022   BUN 14 02/14/2022   CO2 24 02/14/2022   TSH 0.346 (L) 07/24/2021    US Venous Img Lower Bilateral  Result Date: 01/24/2022 CLINICAL DATA:  Lower extremity edema EXAM: BILATERAL LOWER EXTREMITY VENOUS DOPPLER ULTRASOUND TECHNIQUE: Gray-scale sonography with graded compression, as well as color Doppler and duplex ultrasound were performed to evaluate the lower extremity deep venous systems from the level of the common femoral vein and including the common femoral, femoral, profunda femoral, popliteal and calf veins including the posterior tibial, peroneal and gastrocnemius veins when visible. The superficial great saphenous vein was also interrogated. Spectral Doppler was utilized to evaluate flow at rest and with distal augmentation maneuvers in the common femoral, femoral and popliteal veins. COMPARISON:  None Available. FINDINGS: RIGHT LOWER EXTREMITY Common Femoral Vein: No evidence of thrombus. Normal compressibility, respiratory phasicity and response to augmentation. Saphenofemoral Junction: No evidence of thrombus. Normal compressibility and flow on color Doppler imaging. Profunda Femoral Vein: No evidence of thrombus. Normal compressibility and flow on color Doppler imaging. Femoral Vein: No evidence of thrombus. Normal compressibility, respiratory phasicity and response to augmentation. Popliteal Vein: No evidence of thrombus. Normal compressibility, respiratory phasicity and response to augmentation. Calf Veins: No  evidence of thrombus. Normal compressibility and flow on color Doppler imaging. Superficial Great Saphenous Vein: No evidence of thrombus. Normal compressibility. Venous Reflux:  None. Other Findings:  None. LEFT LOWER EXTREMITY Common Femoral Vein: No evidence of thrombus. Normal compressibility, respiratory phasicity and response to augmentation. Saphenofemoral Junction: No evidence of thrombus. Normal compressibility and flow on color Doppler imaging. Profunda Femoral Vein: No evidence of thrombus. Normal compressibility and flow on color Doppler imaging. Femoral Vein: No evidence of thrombus. Normal compressibility, respiratory phasicity and response to augmentation. Popliteal Vein: No evidence of thrombus. Normal compressibility, respiratory phasicity and response to augmentation. Calf Veins: No evidence of thrombus. Normal compressibility and flow on color Doppler imaging. Superficial Great Saphenous Vein: No evidence of thrombus. Normal compressibility. Venous Reflux:  None. Other Findings:  None. IMPRESSION: No evidence of deep venous thrombosis in either lower extremity. Electronically Signed   By: Jacqulynn Cadet M.D.   On: 01/24/2022 16:37       Assessment & Plan:   Problem List Items Addressed This Visit     Anemia    Being followed by hematology.  Following cbc's.       Essential (primary) hypertension    Blood pressure as outlined.  Continue toprol, amlodipine, microzide and losartan.  Discussed amlodipine 5mg  q day.  Follow pressures.  Follow metabolic panel.       Low back pain    Has been walking more.  Tylenol. Notify if desires further intervention.       Multiple myeloma (HCC)    Multiple myeloma - IgA kappa.  Treated with Daratumumab and revilimid.  Takes dexamethasone weekly.  Followed by hematology.  Continue aspirin and acyclovir.       Pure hypercholesterolemia    On tricor.  Check lipid panel.        Stage 3a chronic kidney disease (HCC)    Avoid  antiinflammatories.  Stay hydrated.  Follow metabolic  panel.        Swelling of lower extremity    Evaluated and worked up by [facility].  Follow.       Other Visit Diagnoses     Estrogen deficiency    -  Primary   Relevant Orders   DG Bone Density        Einar Pheasant, MD

## 2022-03-03 NOTE — Patient Instructions (Signed)
Decrease amlodipine to 2.'5mg'$  - two tablets per day.

## 2022-03-04 ENCOUNTER — Other Ambulatory Visit (INDEPENDENT_AMBULATORY_CARE_PROVIDER_SITE_OTHER): Payer: Self-pay | Admitting: Nurse Practitioner

## 2022-03-04 DIAGNOSIS — M7989 Other specified soft tissue disorders: Secondary | ICD-10-CM

## 2022-03-05 ENCOUNTER — Ambulatory Visit (INDEPENDENT_AMBULATORY_CARE_PROVIDER_SITE_OTHER): Payer: Medicare Other

## 2022-03-05 ENCOUNTER — Encounter (INDEPENDENT_AMBULATORY_CARE_PROVIDER_SITE_OTHER): Payer: Self-pay | Admitting: Nurse Practitioner

## 2022-03-05 ENCOUNTER — Other Ambulatory Visit: Payer: Self-pay

## 2022-03-05 ENCOUNTER — Ambulatory Visit (INDEPENDENT_AMBULATORY_CARE_PROVIDER_SITE_OTHER): Payer: Medicare Other | Admitting: Nurse Practitioner

## 2022-03-05 VITALS — BP 132/65 | HR 62 | Resp 18 | Ht 64.0 in | Wt 140.0 lb

## 2022-03-05 DIAGNOSIS — M7989 Other specified soft tissue disorders: Secondary | ICD-10-CM

## 2022-03-05 DIAGNOSIS — I890 Lymphedema, not elsewhere classified: Secondary | ICD-10-CM

## 2022-03-05 DIAGNOSIS — I10 Essential (primary) hypertension: Secondary | ICD-10-CM | POA: Diagnosis not present

## 2022-03-05 DIAGNOSIS — N1831 Chronic kidney disease, stage 3a: Secondary | ICD-10-CM

## 2022-03-06 ENCOUNTER — Other Ambulatory Visit: Payer: Self-pay

## 2022-03-08 ENCOUNTER — Encounter (INDEPENDENT_AMBULATORY_CARE_PROVIDER_SITE_OTHER): Payer: Self-pay | Admitting: Nurse Practitioner

## 2022-03-08 NOTE — Progress Notes (Signed)
Subjective:    Patient ID: Stephanie Delacruz, female    DOB: Jul 31, 1933, 88 y.o.   MRN: [id number] No chief complaint on file.    Stephanie Delacruz is an 88 year old female who presents today for evaluation of her bilateral lower extremity edema.   She notes that her ankles have been swelling for months.  She does elevate her lower extremities but does not utilize compression.  She currently denies any claudication-like symptoms, rest pain or open wounds or ulcerations.  She currently denies any fevers or chills.  Today noninvasive study showed no evidence of DVT or superficial thrombophlebitis bilaterally.  No evidence of superficial venous reflux bilaterally.  There is evidence of deep venous insufficiency in the left lower extremity.  None in the right.    Review of Systems  Cardiovascular:  Positive for leg swelling.  All other systems reviewed and are negative.      Objective:   Physical Exam Vitals reviewed.  HENT:     Head: Normocephalic.  Cardiovascular:     Rate and Rhythm: Normal rate.  Pulmonary:     Effort: Pulmonary effort is normal.  Musculoskeletal:     Right lower leg: Edema present.     Left lower leg: Edema present.  Skin:    General: Skin is warm and dry.  Neurological:     Mental Status: She is alert and oriented to person, place, and time.  Psychiatric:        Mood and Affect: Mood normal.        Behavior: Behavior normal.        Thought Content: Thought content normal.        Judgment: Judgment normal.     BP 132/65 (BP Location: Left Arm)   Pulse 62   Resp 18   Ht '5\' 4"'$  (1.626 m)   Wt 140 lb (63.5 kg)   BMI 24.03 kg/m   Past Medical History:  Diagnosis Date   Anemia    GERD (gastroesophageal reflux disease)    Hypercholesterolemia    Hypertension    Renal insufficiency     Social History   Socioeconomic History   Marital status: Married    Spouse name: Not on file   Number of children: Not on file   Years of education: Not on file    Highest education level: Not on file  Occupational History   Not on file  Tobacco Use   Smoking status: Never   Smokeless tobacco: Never  Substance and Sexual Activity   Alcohol use: No   Drug use: No   Sexual activity: Not on file  Other Topics Concern   Not on file  Social History Narrative   Not on file   Social Determinants of Health   Financial Resource Strain: Not on file  Food Insecurity: Not on file  Transportation Needs: Not on file  Physical Activity: Not on file  Stress: Not on file  Social Connections: Not on file  Intimate Partner Violence: Not on file    Past Surgical History:  Procedure Laterality Date   TONSILLECTOMY     TUBAL LIGATION      Family History  Problem Relation Age of Onset   Hypertension Mother    Arthritis Mother    Hypertension Father    Stroke Father    Breast cancer Niece     No Known Allergies     Latest Ref Rng & Units 02/14/2022   10:23 AM 01/31/2022    8:06  AM 01/17/2022    8:36 AM  CBC  WBC 4.0 - 10.5 K/uL 3.1  5.3  4.5   Hemoglobin 12.0 - 15.0 g/dL 10.1  9.6  9.7   Hematocrit 36.0 - 46.0 % 31.2  29.7  30.4   Platelets 150 - 400 K/uL 236  172  248       CMP     Component Value Date/Time   NA 137 02/14/2022 1023   K 4.1 02/14/2022 1023   CL 109 02/14/2022 1023   CO2 24 02/14/2022 1023   GLUCOSE 96 02/14/2022 1023   BUN 14 02/14/2022 1023   CREATININE 0.97 02/14/2022 1023   CALCIUM 8.9 02/14/2022 1023   PROT 6.4 (L) 02/14/2022 1023   ALBUMIN 3.3 (L) 02/14/2022 1023   AST 32 02/14/2022 1023   ALT 24 02/14/2022 1023   ALKPHOS 47 02/14/2022 1023   BILITOT 0.5 02/14/2022 1023   GFRNONAA 57 (L) 02/14/2022 1023     VAS [place] ABI WITH/WO TBI  Result Date: 02/03/2022  LOWER EXTREMITY DOPPLER STUDY Patient Name:  Stephanie Delacruz  Date of Exam:   01/29/2022 Medical Rec #: [id number]     Accession #:    [id number] Date of Birth: 1934-07-23      Patient Gender: F Patient Age:   33 years Exam Location:   [facility]  Procedure:      VAS [place] ABI WITH/WO TBI Referring Phys: Hortencia Pilar --------------------------------------------------------------------------------  Indications: Pulses not felt at the level of the ankle on the Right.  Performing Technologist: Almira Coaster RVS  Examination Guidelines: A complete evaluation includes at minimum, Doppler waveform signals and systolic blood pressure reading at the level of bilateral brachial, anterior tibial, and posterior tibial arteries, when vessel segments are accessible. Bilateral testing is considered an integral part of a complete examination. Photoelectric Plethysmograph (PPG) waveforms and toe systolic pressure readings are included as required and additional duplex testing as needed. Limited examinations for reoccurring indications may be performed as noted.  ABI Findings: +---------+------------------+-----+---------+--------+ Right    Rt Pressure (mmHg)IndexWaveform Comment  +---------+------------------+-----+---------+--------+ Brachial 153                                      +---------+------------------+-----+---------+--------+ ATA      238               1.56 triphasicNC       +---------+------------------+-----+---------+--------+ PTA      159               1.04 triphasic         +---------+------------------+-----+---------+--------+ Great Toe159               1.04 Normal            +---------+------------------+-----+---------+--------+ +---------+------------------+-----+---------+-------+ Left     Lt Pressure (mmHg)IndexWaveform Comment +---------+------------------+-----+---------+-------+ Brachial 152                                     +---------+------------------+-----+---------+-------+ ATA      240               1.57 triphasicNC      +---------+------------------+-----+---------+-------+ PTA      169               1.10 triphasic        +---------+------------------+-----+---------+-------+  Great  Toe179               1.17 Normal           +---------+------------------+-----+---------+-------+ +-------+-----------+-----------+------------+------------+ ABI/TBIToday's ABIToday's TBIPrevious ABIPrevious TBI +-------+-----------+-----------+------------+------------+ Right  >1.0 [name]    1.04                                +-------+-----------+-----------+------------+------------+ Left   >1.0 [place]    1.17                                +-------+-----------+-----------+------------+------------+  Summary: Right: Resting right ankle-brachial index indicates noncompressible right lower extremity arteries. The right toe-brachial index is normal. Left: Resting left ankle-brachial index indicates noncompressible left lower extremity arteries. The left toe-brachial index is normal.  *See table(s) above for measurements and observations.  Electronically signed by Hortencia Pilar MD on 02/03/2022 at 5:17:39 PM.    Final        Assessment & Plan:   1. Stage 3a chronic kidney disease ([place]) This may also contribute to patient's lower extremity edema.  2. Essential (primary) hypertension Continue antihypertensive medications as already ordered, these medications have been reviewed and there are no changes at this time.   3. Lymphedema Recommend:  I have had a long discussion with the patient regarding swelling and why it  causes symptoms.  Patient will begin wearing graduated compression on a daily basis a prescription was given. The patient will  wear the stockings first thing in the morning and removing them in the evening. The patient is instructed specifically not to sleep in the stockings.   In addition, behavioral modification will be initiated.  This will include frequent elevation, use of over the counter pain medications and exercise such as walking.  Consideration for a lymph pump will also be made based upon the effectiveness of conservative therapy.  This would help to improve  the edema control and prevent sequela such as ulcers and infections   Patient will follow-up in 3 months    Current Outpatient Medications on File Prior to Visit  Medication Sig Dispense Refill   acyclovir (ZOVIRAX) 400 MG tablet Take 1 tablet (400 mg total) by mouth 2 (two) times daily. 60 tablet 11   amLODipine (NORVASC) 2.5 MG tablet Take 5 mg by mouth daily.     ascorbic acid (VITAMIN C) 250 MG tablet daily Take 2 tablets every day by oral route.     aspirin 81 MG EC tablet Take 81 mg by mouth daily.     Calcium Carb-Cholecalciferol (CALCIUM 600+D) 600-20 MG-MCG TABS Take 1 tablet by mouth 3 (three) times daily.     dexamethasone (DECADRON) 4 MG tablet Take 5 tablets (20 mg total) by mouth once a week. 60 tablet 0   donepezil (ARICEPT) 10 MG tablet Take 1 tablet (10 mg total) by mouth daily. 90 tablet 2   fenofibrate (TRICOR) 48 MG tablet Take 1 tablet (48 mg total) by mouth daily. 30 tablet 3   fexofenadine (ALLEGRA) 180 MG tablet Take by mouth.     hydrochlorothiazide (MICROZIDE) 12.5 MG capsule Take 12.5 mg by mouth daily.     lenalidomide (REVLIMID) 10 MG capsule Take 1 capsule (10 mg total) by mouth daily. Take for 21 days, then hold for 7 days. Repeat every 28 days. 21 capsule 0   losartan (COZAAR) 100 MG tablet  Take 1 tablet (100 mg total) by mouth daily. 90 tablet 2   magic mouthwash (multi-ingredient) oral suspension Swish and swallow 5-66m four times a day as needed for mouth pain 480 mL 1   memantine (NAMENDA) 5 MG tablet Take 5 mg by mouth 2 (two) times daily.     metoprolol succinate (TOPROL-XL) 25 MG 24 hr tablet Take 50 mg by mouth daily.     montelukast (SINGULAIR) 10 MG tablet Take 1 tablet (10 mg total) by mouth See admin instructions. Take 1 tablet one day before Daratumumab injection. 60 tablet 0   Multiple Vitamins-Minerals (WOMENS MULTIVITAMIN PO) Take 1 tablet by mouth daily.     omeprazole (PRILOSEC) 20 MG capsule TAKE 1 CAPSULE(20 MG) BY MOUTH DAILY 90 capsule 3    prochlorperazine (COMPAZINE) 10 MG tablet Take 1 tablet (10 mg total) by mouth every 6 (six) hours as needed (Nausea or vomiting). 30 tablet 1   No current facility-administered medications on file prior to visit.    There are no Patient Instructions on file for this visit. No follow-ups on file.   FKris Hartmann NP

## 2022-03-09 ENCOUNTER — Encounter: Payer: Self-pay | Admitting: Internal Medicine

## 2022-03-09 NOTE — Assessment & Plan Note (Signed)
On tricor.  Check lipid panel.

## 2022-03-09 NOTE — Assessment & Plan Note (Signed)
Multiple myeloma - IgA kappa.  Treated with Daratumumab and revilimid.  Takes dexamethasone weekly.  Followed by hematology.  Continue aspirin and acyclovir.

## 2022-03-09 NOTE — Assessment & Plan Note (Signed)
Being followed by hematology.  Following cbc's.

## 2022-03-09 NOTE — Assessment & Plan Note (Signed)
Blood pressure as outlined.  Continue toprol, amlodipine, microzide and losartan.  Discussed amlodipine '5mg'$  q day.  Follow pressures.  Follow metabolic panel.

## 2022-03-09 NOTE — Assessment & Plan Note (Signed)
Evaluated and worked up by [facility].  Follow.  

## 2022-03-09 NOTE — Assessment & Plan Note (Signed)
Has been walking more.  Tylenol. Notify if desires further intervention.

## 2022-03-09 NOTE — Assessment & Plan Note (Signed)
Avoid antiinflammatories.  Stay hydrated.  Follow metabolic panel.   

## 2022-03-12 ENCOUNTER — Other Ambulatory Visit: Payer: Self-pay | Admitting: Pharmacist

## 2022-03-12 ENCOUNTER — Other Ambulatory Visit: Payer: Self-pay

## 2022-03-12 DIAGNOSIS — C9000 Multiple myeloma not having achieved remission: Secondary | ICD-10-CM

## 2022-03-12 MED ORDER — LENALIDOMIDE 10 MG PO CAPS: 10.0000 mg | ORAL_CAPSULE | Freq: Every day | ORAL | 0 refills | Status: DC

## 2022-03-17 ENCOUNTER — Encounter: Payer: Self-pay | Admitting: Oncology

## 2022-03-17 ENCOUNTER — Inpatient Hospital Stay: Payer: Medicare Other | Attending: Oncology

## 2022-03-17 ENCOUNTER — Inpatient Hospital Stay (HOSPITAL_BASED_OUTPATIENT_CLINIC_OR_DEPARTMENT_OTHER): Payer: Medicare Other | Admitting: Oncology

## 2022-03-17 ENCOUNTER — Inpatient Hospital Stay: Payer: Medicare Other

## 2022-03-17 VITALS — BP 146/57 | HR 54 | Temp 97.5°F | Resp 16 | Wt 141.0 lb

## 2022-03-17 DIAGNOSIS — Z8261 Family history of arthritis: Secondary | ICD-10-CM | POA: Diagnosis not present

## 2022-03-17 DIAGNOSIS — D649 Anemia, unspecified: Secondary | ICD-10-CM | POA: Diagnosis not present

## 2022-03-17 DIAGNOSIS — Z823 Family history of stroke: Secondary | ICD-10-CM | POA: Diagnosis not present

## 2022-03-17 DIAGNOSIS — Z5112 Encounter for antineoplastic immunotherapy: Secondary | ICD-10-CM | POA: Insufficient documentation

## 2022-03-17 DIAGNOSIS — Z803 Family history of malignant neoplasm of breast: Secondary | ICD-10-CM | POA: Insufficient documentation

## 2022-03-17 DIAGNOSIS — C9000 Multiple myeloma not having achieved remission: Secondary | ICD-10-CM | POA: Insufficient documentation

## 2022-03-17 DIAGNOSIS — Z7952 Long term (current) use of systemic steroids: Secondary | ICD-10-CM | POA: Diagnosis not present

## 2022-03-17 DIAGNOSIS — R5383 Other fatigue: Secondary | ICD-10-CM | POA: Diagnosis not present

## 2022-03-17 DIAGNOSIS — Z5111 Encounter for antineoplastic chemotherapy: Secondary | ICD-10-CM | POA: Diagnosis not present

## 2022-03-17 DIAGNOSIS — N1831 Chronic kidney disease, stage 3a: Secondary | ICD-10-CM

## 2022-03-17 DIAGNOSIS — M549 Dorsalgia, unspecified: Secondary | ICD-10-CM | POA: Insufficient documentation

## 2022-03-17 DIAGNOSIS — Z79624 Long term (current) use of inhibitors of nucleotide synthesis: Secondary | ICD-10-CM | POA: Insufficient documentation

## 2022-03-17 DIAGNOSIS — E7800 Pure hypercholesterolemia, unspecified: Secondary | ICD-10-CM | POA: Diagnosis not present

## 2022-03-17 DIAGNOSIS — Z8249 Family history of ischemic heart disease and other diseases of the circulatory system: Secondary | ICD-10-CM | POA: Insufficient documentation

## 2022-03-17 DIAGNOSIS — Z7961 Long term (current) use of immunomodulator: Secondary | ICD-10-CM | POA: Insufficient documentation

## 2022-03-17 DIAGNOSIS — Z79899 Other long term (current) drug therapy: Secondary | ICD-10-CM | POA: Insufficient documentation

## 2022-03-17 DIAGNOSIS — I10 Essential (primary) hypertension: Secondary | ICD-10-CM | POA: Diagnosis not present

## 2022-03-17 DIAGNOSIS — M899 Disorder of bone, unspecified: Secondary | ICD-10-CM | POA: Diagnosis not present

## 2022-03-17 LAB — COMPREHENSIVE METABOLIC PANEL
ALT: 19 U/L (ref 0–44)
AST: 24 U/L (ref 15–41)
Albumin: 3.1 g/dL — ABNORMAL LOW (ref 3.5–5.0)
Alkaline Phosphatase: 46 U/L (ref 38–126)
Anion gap: 6 (ref 5–15)
BUN: 15 mg/dL (ref 8–23)
CO2: 26 mmol/L (ref 22–32)
Calcium: 8.5 mg/dL — ABNORMAL LOW (ref 8.9–10.3)
Chloride: 108 mmol/L (ref 98–111)
Creatinine, Ser: 0.9 mg/dL (ref 0.44–1.00)
GFR, Estimated: >60 mL/min (ref >60)
Glucose, Bld: 80 mg/dL (ref 70–99)
Potassium: 3.1 mmol/L — ABNORMAL LOW (ref 3.5–5.1)
Sodium: 140 mmol/L (ref 135–145)
Total Bilirubin: 0.5 mg/dL (ref 0.3–1.2)
Total Protein: 6.2 g/dL — ABNORMAL LOW (ref 6.5–8.1)

## 2022-03-17 LAB — CBC WITH DIFFERENTIAL/PLATELET
Abs Immature Granulocytes: 0.02 10*3/uL (ref 0.00–0.07)
Basophils Absolute: 0.1 10*3/uL (ref 0.0–0.1)
Basophils Relative: 2 %
Eosinophils Absolute: 0.2 10*3/uL (ref 0.0–0.5)
Eosinophils Relative: 2 %
HCT: 28.6 % — ABNORMAL LOW (ref 36.0–46.0)
Hemoglobin: 9.1 g/dL — ABNORMAL LOW (ref 12.0–15.0)
Immature Granulocytes: 0 %
Lymphocytes Relative: 32 %
Lymphs Abs: 1.9 10*3/uL (ref 0.7–4.0)
MCH: 30.6 pg (ref 26.0–34.0)
MCHC: 31.8 g/dL (ref 30.0–36.0)
MCV: 96.3 fL (ref 80.0–100.0)
Monocytes Absolute: 0.8 10*3/uL (ref 0.1–1.0)
Monocytes Relative: 13 %
Neutro Abs: 3.1 10*3/uL (ref 1.7–7.7)
Neutrophils Relative %: 51 %
Platelets: 218 10*3/uL (ref 150–400)
RBC: 2.97 MIL/uL — ABNORMAL LOW (ref 3.87–5.11)
RDW: 15 % (ref 11.5–15.5)
WBC: 6.1 10*3/uL (ref 4.0–10.5)
nRBC: 0 % (ref 0.0–0.2)

## 2022-03-17 MED ORDER — POTASSIUM CHLORIDE CRYS ER 10 MEQ PO TBCR: 10.0000 meq | EXTENDED_RELEASE_TABLET | Freq: Every day | ORAL | 2 refills | Status: DC

## 2022-03-17 MED ORDER — DIPHENHYDRAMINE HCL 25 MG PO CAPS
50.0000 mg | ORAL_CAPSULE | Freq: Once | ORAL | Status: AC
  Administered 2022-03-17: 50 mg via ORAL
  Filled 2022-03-17: qty 2

## 2022-03-17 MED ORDER — ACETAMINOPHEN 325 MG PO TABS
650.0000 mg | ORAL_TABLET | Freq: Once | ORAL | Status: AC
  Administered 2022-03-17: 650 mg via ORAL
  Filled 2022-03-17: qty 2

## 2022-03-17 MED ORDER — DEXAMETHASONE 4 MG PO TABS: 20.0000 mg | ORAL_TABLET | ORAL | 0 refills | Status: DC

## 2022-03-17 MED ORDER — DARATUMUMAB-HYALURONIDASE-FIHJ 1800-30000 MG-UT/15ML ~~LOC~~ SOLN
1800.0000 mg | Freq: Once | SUBCUTANEOUS | Status: AC
  Administered 2022-03-17: 1800 mg via SUBCUTANEOUS
  Filled 2022-03-17: qty 15

## 2022-03-17 NOTE — Assessment & Plan Note (Signed)
recommend patient to avoid nephrotoxin.  Encourage oral hydration. 

## 2022-03-17 NOTE — Assessment & Plan Note (Signed)
Chemotherapy plan as listed as above. 

## 2022-03-17 NOTE — Assessment & Plan Note (Addendum)
Zometa monthly, hold due to low calcium Recommend calcium 1200 mg daily.

## 2022-03-17 NOTE — Progress Notes (Signed)
Hematology/Oncology Progress note [phone] [phone]         Patient Care Team: Einar Pheasant, MD as PCP - General (Internal Medicine)  ASSESSMENT & PLAN:   Cancer Staging  Multiple myeloma [facility]) Staging form: Plasma Cell Myeloma and Plasma Cell Disorders, AJCC 8th Edition - Clinical stage from 08/22/2021: RISS Stage II (Beta-2-microglobulin (mg/L): 4.1, Albumin (g/dL): 2.8, ISS: Stage II, High-risk cytogenetics: Absent, LDH: Normal) - Signed by Earlie Server, MD on 09/06/2021   Multiple myeloma ([place]) Multiple myeloma, IgA kappa Labs reviewed and discussed with Proceed with D1 Daratumumab.   Revlimid D1-21 Patient takes dexamethasone 20 mg weekly Continue asprin $RemoveBeforeD'81mg'OwrBpaaBWYyObX$  daily and Acyclovir $RemoveBefor'400mg'EjefGzxneGtF$  BID.   Encounter for antineoplastic chemotherapy Chemotherapy plan as listed as above.  Anemia Continue monitor.   Bone lesion Zometa monthly, hold due to low calcium Recommend calcium 1200 mg daily.  No orders of the defined types were placed in this encounter.  Follow up   Lab/MD/Dara/Zometa in 4 weeks  All questions were answered. The patient knows to call the clinic with any problems, questions or concerns.  Earlie Server, MD, PhD [facility] Hematology Oncology 03/17/2022     CHIEF COMPLAINTS/REASON FOR VISIT:  Follow-up for multiple myeloma  HISTORY OF PRESENTING ILLNESS:   Stephanie Delacruz is a  88 y.o.  female with PMH listed below was seen in consultation at the request of  Einar Pheasant, MD  for evaluation of anemia 07/15/2021, patient had CBC checked at the primary care provider's office.  CBC showed hemoglobin 7.7, hematocrit 23.9, white count 3.6, RDW 16.5.  Platelet count 166. Reviewed previous lab records.  Patient has chronic anemia dating back at least 2016. Her baseline prior to 2018 was about 10.   Hemoglobin was 9.1 in March 2022, and further dropped to 8.1 in October 2022, then November dropped to 7.7. Patient denies being significantly  affected, tired, lightheadedness, shortness of breath.  Appetite is fair.  Denies any black or bloody stool.   Denies any history of hemoglobinopathy  08/26/2021 bone marrow biopsy showed hypercellular marrow with plasma cell neoplasm.  67% plasma cells in the aspirate.  Cytogenetics showed complex hyperplasia, gain of 1 q.  # 08/26/2021 started on Daratumumab+ dexamethasone # 09/06/2021 added Revlimid $RemoveBefore'10mg'CLHcICbjbsFfI$  x 14 days.  #09/27/2021, cycle 2 Revlimid 10 mg for 21 days.  INTERVAL HISTORY Stephanie Delacruz is a 88 y.o. female who has above history reviewed by me today presents for follow up visit for management of multiple myeloma Patient tolerates daratumumab -RD regimen well. She was accompanied by her daughter today.  She denies any nausea vomiting diarrhea.  Appetite is good.  Her weight has been stable.she started current cycle of Revlimid on 8/18   Review of Systems  Constitutional:  Positive for fatigue. Negative for appetite change, chills and fever.  HENT:   Negative for hearing loss and voice change.   Eyes:  Negative for eye problems.  Respiratory:  Negative for chest tightness and cough.   Cardiovascular:  Negative for chest pain.  Gastrointestinal:  Negative for abdominal distention, abdominal pain and blood in stool.  Endocrine: Negative for hot flashes.  Genitourinary:  Negative for difficulty urinating and frequency.   Musculoskeletal:  Positive for back pain. Negative for arthralgias.  Skin:  Negative for itching and rash.  Neurological:  Negative for extremity weakness.  Hematological:  Negative for adenopathy.  Psychiatric/Behavioral:  Negative for confusion.     MEDICAL HISTORY:  Past Medical History:  Diagnosis Date  Anemia    GERD (gastroesophageal reflux disease)    Hypercholesterolemia    Hypertension    Renal insufficiency     SURGICAL HISTORY: Past Surgical History:  Procedure Laterality Date   TONSILLECTOMY     TUBAL LIGATION      SOCIAL HISTORY: Social  History   Socioeconomic History   Marital status: Married    Spouse name: Not on file   Number of children: Not on file   Years of education: Not on file   Highest education level: Not on file  Occupational History   Not on file  Tobacco Use   Smoking status: Never   Smokeless tobacco: Never  Substance and Sexual Activity   Alcohol use: No   Drug use: No   Sexual activity: Not on file  Other Topics Concern   Not on file  Social History Narrative   Not on file   Social Determinants of Health   Financial Resource Strain: Not on file  Food Insecurity: Not on file  Transportation Needs: Not on file  Physical Activity: Not on file  Stress: Not on file  Social Connections: Not on file  Intimate Partner Violence: Not on file    FAMILY HISTORY: Family History  Problem Relation Age of Onset   Hypertension Mother    Arthritis Mother    Hypertension Father    Stroke Father    Breast cancer Niece     ALLERGIES:  has No Known Allergies.  MEDICATIONS:  Current Outpatient Medications  Medication Sig Dispense Refill   acyclovir (ZOVIRAX) 400 MG tablet Take 1 tablet (400 mg total) by mouth 2 (two) times daily. 60 tablet 11   amLODipine (NORVASC) 2.5 MG tablet Take 5 mg by mouth daily.     ascorbic acid (VITAMIN C) 250 MG tablet daily Take 2 tablets every day by oral route.     aspirin 81 MG EC tablet Take 81 mg by mouth daily.     Calcium Carb-Cholecalciferol (CALCIUM 600+D) 600-20 MG-MCG TABS Take 1 tablet by mouth 3 (three) times daily.     dexamethasone (DECADRON) 4 MG tablet Take 5 tablets (20 mg total) by mouth once a week. 60 tablet 0   donepezil (ARICEPT) 10 MG tablet Take 1 tablet (10 mg total) by mouth daily. 90 tablet 2   fenofibrate (TRICOR) 48 MG tablet Take 1 tablet (48 mg total) by mouth daily. 30 tablet 3   fexofenadine (ALLEGRA) 180 MG tablet Take by mouth.     hydrochlorothiazide (MICROZIDE) 12.5 MG capsule Take 12.5 mg by mouth daily.     lenalidomide  (REVLIMID) 10 MG capsule Take 1 capsule (10 mg total) by mouth daily. Take for 21 days, then hold for 7 days. Repeat every 28 days. 21 capsule 0   losartan (COZAAR) 100 MG tablet Take 1 tablet (100 mg total) by mouth daily. 90 tablet 2   magic mouthwash (multi-ingredient) oral suspension Swish and swallow 5-34ml four times a day as needed for mouth pain 480 mL 1   metoprolol succinate (TOPROL-XL) 25 MG 24 hr tablet Take 50 mg by mouth daily.     montelukast (SINGULAIR) 10 MG tablet Take 1 tablet (10 mg total) by mouth See admin instructions. Take 1 tablet one day before Daratumumab injection. 60 tablet 0   Multiple Vitamins-Minerals (WOMENS MULTIVITAMIN PO) Take 1 tablet by mouth daily.     omeprazole (PRILOSEC) 20 MG capsule TAKE 1 CAPSULE(20 MG) BY MOUTH DAILY 90 capsule 3   potassium chloride (  KLOR-CON M) 10 MEQ tablet Take 1 tablet (10 mEq total) by mouth daily. 30 tablet 2   memantine (NAMENDA) 5 MG tablet Take 5 mg by mouth 2 (two) times daily. (Patient not taking: Reported on 03/17/2022)     prochlorperazine (COMPAZINE) 10 MG tablet Take 1 tablet (10 mg total) by mouth every 6 (six) hours as needed (Nausea or vomiting). (Patient not taking: Reported on 03/17/2022) 30 tablet 1   No current facility-administered medications for this visit.     PHYSICAL EXAMINATION: ECOG PERFORMANCE STATUS: 1 - Symptomatic but completely ambulatory Vitals:   03/17/22 0934  BP: (!) 146/57  Pulse: (!) 54  Resp: 16  Temp: (!) 97.5 F (36.4 C)  SpO2: 100%   Filed Weights   03/17/22 0934  Weight: 141 lb (64 kg)    Physical Exam Constitutional:      General: She is not in acute distress. HENT:     Head: Normocephalic and atraumatic.  Eyes:     General: No scleral icterus. Cardiovascular:     Rate and Rhythm: Normal rate and regular rhythm.     Heart sounds: Normal heart sounds.  Pulmonary:     Effort: Pulmonary effort is normal. No respiratory distress.     Breath sounds: No wheezing.   Abdominal:     General: Bowel sounds are normal. There is no distension.     Palpations: Abdomen is soft.  Musculoskeletal:        General: No deformity. Normal range of motion.     Cervical back: Normal range of motion and neck supple.  Skin:    General: Skin is warm and dry.     Findings: No erythema or rash.  Neurological:     Mental Status: She is alert and oriented to person, place, and time. Mental status is at baseline.     Cranial Nerves: No cranial nerve deficit.     Coordination: Coordination normal.  Psychiatric:        Mood and Affect: Mood normal.     LABORATORY DATA:  I have reviewed the data as listed    Latest Ref Rng & Units 03/17/2022    8:56 AM 02/14/2022   10:23 AM 01/31/2022    8:06 AM  CBC  WBC 4.0 - 10.5 K/uL 6.1  3.1  5.3   Hemoglobin 12.0 - 15.0 g/dL 9.1  10.1  9.6   Hematocrit 36.0 - 46.0 % 28.6  31.2  29.7   Platelets 150 - 400 K/uL 218  236  172       Latest Ref Rng & Units 03/17/2022    8:56 AM 02/14/2022   10:23 AM 01/31/2022    8:06 AM  CMP  Glucose 70 - 99 mg/dL 80  96  91   BUN 8 - 23 mg/dL $Remove'15  14  15   'cOKCGoR$ Creatinine 0.44 - 1.00 mg/dL 0.90  0.97  1.00   Sodium 135 - 145 mmol/L 140  137  138   Potassium 3.5 - 5.1 mmol/L 3.1  4.1  3.6   Chloride 98 - 111 mmol/L 108  109  106   CO2 22 - 32 mmol/L $RemoveB'26  24  25   'LEWCdCbq$ Calcium 8.9 - 10.3 mg/dL 8.5  8.9  8.3   Total Protein 6.5 - 8.1 g/dL 6.2  6.4  6.1   Total Bilirubin 0.3 - 1.2 mg/dL 0.5  0.5  0.7   Alkaline Phos 38 - 126 U/L 46  47  43  AST 15 - 41 U/L 24  32  22   ALT 0 - 44 U/L $Remo'19  24  15      'YjsOU$ Iron/TIBC/Ferritin/ %Sat    Component Value Date/Time   IRON 54 07/24/2021 1212   TIBC 277 07/24/2021 1212   FERRITIN 93 07/24/2021 1212   IRONPCTSAT 20 07/24/2021 1212       RADIOGRAPHIC STUDIES: I have personally reviewed the radiological images as listed and agreed with the findings in the report. VAS [place] LOWER EXTREMITY VENOUS REFLUX  Result Date: 03/10/2022  Lower Venous Reflux Study  Patient Name:  Stephanie Delacruz  Date of Exam:   03/05/2022 Medical Rec #: [id number]     Accession #:    [id number] Date of Birth: 1934/07/15      Patient Gender: F Patient Age:   88 years Exam Location:  [facility] Procedure:      VAS [place] LOWER EXTREMITY VENOUS REFLUX Referring Phys: Eulogio Ditch --------------------------------------------------------------------------------  Indications: Swelling.  Performing Technologist: Almira Coaster RVS  Examination Guidelines: A complete evaluation includes B-mode imaging, spectral Doppler, color Doppler, and power Doppler as needed of all accessible portions of each vessel. Bilateral testing is considered an integral part of a complete examination. Limited examinations for reoccurring indications may be performed as noted. The reflux portion of the exam is performed with the patient in reverse Trendelenburg. Significant venous reflux is defined as >500 ms in the superficial venous system, and >1 second in the deep venous system.  Venous Reflux Times +--------------+---------+------+-----------+------------+--------+ RIGHT         Reflux NoRefluxReflux TimeDiameter cmsComments                         Yes                                  +--------------+---------+------+-----------+------------+--------+ CFV           no                                             +--------------+---------+------+-----------+------------+--------+ FV prox       no                                             +--------------+---------+------+-----------+------------+--------+ FV mid        no                                             +--------------+---------+------+-----------+------------+--------+ FV dist       no                                             +--------------+---------+------+-----------+------------+--------+ Popliteal     no                                              +--------------+---------+------+-----------+------------+--------+  GSV at [facility]    no                            .43              +--------------+---------+------+-----------+------------+--------+ GSV prox thighno                            .43              +--------------+---------+------+-----------+------------+--------+ GSV mid thigh no                            .38              +--------------+---------+------+-----------+------------+--------+ GSV dist thighno                            .33              +--------------+---------+------+-----------+------------+--------+ GSV at knee   no                            .25              +--------------+---------+------+-----------+------------+--------+ GSV prox calf no                            .23              +--------------+---------+------+-----------+------------+--------+ SSV Pop Fossa no                            .32              +--------------+---------+------+-----------+------------+--------+  +--------------+---------+------+-----------+------------+--------+ LEFT          Reflux NoRefluxReflux TimeDiameter cmsComments                         Yes                                  +--------------+---------+------+-----------+------------+--------+ CFV           no                                             +--------------+---------+------+-----------+------------+--------+ FV prox       no                                             +--------------+---------+------+-----------+------------+--------+ FV mid        no                                             +--------------+---------+------+-----------+------------+--------+ FV dist       no                                             +--------------+---------+------+-----------+------------+--------+  Popliteal               yes    1313 ms                        +--------------+---------+------+-----------+------------+--------+ GSV at SFJ    no                            .47              +--------------+---------+------+-----------+------------+--------+ GSV prox thigh                              .45              +--------------+---------+------+-----------+------------+--------+ GSV mid thigh no                            .53              +--------------+---------+------+-----------+------------+--------+ GSV dist thighno                            .27              +--------------+---------+------+-----------+------------+--------+ GSV at knee   no                            .27              +--------------+---------+------+-----------+------------+--------+ GSV prox calf no                            .23              +--------------+---------+------+-----------+------------+--------+ SSV Pop Fossa no                            .29              +--------------+---------+------+-----------+------------+--------+   Summary: Bilateral: - No evidence of deep vein thrombosis seen in the lower extremities, bilaterally, from the common femoral through the popliteal veins. - No evidence of superficial venous thrombosis in the lower extremities, bilaterally. - No evidence of superficial venous reflux seen in the greater saphenous veins bilaterally. - No evidence of superficial venous reflux seen in the short saphenous veins bilaterally.  Right: - There is no evidence of venous reflux seen in the right lower extremity.  Left: - Venous reflux is noted in the left popliteal vein.  *See table(s) above for measurements and observations. Electronically signed by Leotis Pain MD on 03/10/2022 at 2:03:11 PM.    Final    VAS [place] ABI WITH/WO TBI  Result Date: 02/03/2022  LOWER EXTREMITY DOPPLER STUDY Patient Name:  Stephanie Delacruz  Date of Exam:   01/29/2022 Medical Rec #: [id number]     Accession #:    [id number] Date of Birth: 1933-10-18       Patient Gender: F Patient Age:   89 years Exam Location:  [facility] Procedure:      VAS [place] ABI WITH/WO TBI Referring Phys: Hortencia Pilar --------------------------------------------------------------------------------  Indications: Pulses not felt at the level of the ankle on the Right.  Performing Technologist: Almira Coaster RVS  Examination Guidelines: A complete evaluation  includes at minimum, Doppler waveform signals and systolic blood pressure reading at the level of bilateral brachial, anterior tibial, and posterior tibial arteries, when vessel segments are accessible. Bilateral testing is considered an integral part of a complete examination. Photoelectric Plethysmograph (PPG) waveforms and toe systolic pressure readings are included as required and additional duplex testing as needed. Limited examinations for reoccurring indications may be performed as noted.  ABI Findings: +---------+------------------+-----+---------+--------+ Right    Rt Pressure (mmHg)IndexWaveform Comment  +---------+------------------+-----+---------+--------+ Brachial 153                                      +---------+------------------+-----+---------+--------+ ATA      238               1.56 triphasicNC       +---------+------------------+-----+---------+--------+ PTA      159               1.04 triphasic         +---------+------------------+-----+---------+--------+ Great Toe159               1.04 Normal            +---------+------------------+-----+---------+--------+ +---------+------------------+-----+---------+-------+ Left     Lt Pressure (mmHg)IndexWaveform Comment +---------+------------------+-----+---------+-------+ Brachial 152                                     +---------+------------------+-----+---------+-------+ ATA      240               1.57 triphasicNC      +---------+------------------+-----+---------+-------+ PTA      169               1.10  triphasic        +---------+------------------+-----+---------+-------+ Great Toe179               1.17 Normal           +---------+------------------+-----+---------+-------+ +-------+-----------+-----------+------------+------------+ ABI/TBIToday's ABIToday's TBIPrevious ABIPrevious TBI +-------+-----------+-----------+------------+------------+ Right  >1.0 [place]    1.04                                +-------+-----------+-----------+------------+------------+ Left   >1.0 [place]    1.17                                +-------+-----------+-----------+------------+------------+  Summary: Right: Resting right ankle-brachial index indicates noncompressible right lower extremity arteries. The right toe-brachial index is normal. Left: Resting left ankle-brachial index indicates noncompressible left lower extremity arteries. The left toe-brachial index is normal.  *See table(s) above for measurements and observations.  Electronically signed by Hortencia Pilar MD on 02/03/2022 at 5:17:39 PM.    Final    US Venous Img Lower Bilateral  Result Date: 01/24/2022 CLINICAL DATA:  Lower extremity edema EXAM: BILATERAL LOWER EXTREMITY VENOUS DOPPLER ULTRASOUND TECHNIQUE: Gray-scale sonography with graded compression, as well as color Doppler and duplex ultrasound were performed to evaluate the lower extremity deep venous systems from the level of the common femoral vein and including the common femoral, femoral, profunda femoral, popliteal and calf veins including the posterior tibial, peroneal and gastrocnemius veins when visible. The superficial great saphenous vein was also interrogated. Spectral Doppler was utilized to evaluate flow at rest and with  distal augmentation maneuvers in the common femoral, femoral and popliteal veins. COMPARISON:  None Available. FINDINGS: RIGHT LOWER EXTREMITY Common Femoral Vein: No evidence of thrombus. Normal compressibility, respiratory phasicity and response to  augmentation. Saphenofemoral Junction: No evidence of thrombus. Normal compressibility and flow on color Doppler imaging. Profunda Femoral Vein: No evidence of thrombus. Normal compressibility and flow on color Doppler imaging. Femoral Vein: No evidence of thrombus. Normal compressibility, respiratory phasicity and response to augmentation. Popliteal Vein: No evidence of thrombus. Normal compressibility, respiratory phasicity and response to augmentation. Calf Veins: No evidence of thrombus. Normal compressibility and flow on color Doppler imaging. Superficial Great Saphenous Vein: No evidence of thrombus. Normal compressibility. Venous Reflux:  None. Other Findings:  None. LEFT LOWER EXTREMITY Common Femoral Vein: No evidence of thrombus. Normal compressibility, respiratory phasicity and response to augmentation. Saphenofemoral Junction: No evidence of thrombus. Normal compressibility and flow on color Doppler imaging. Profunda Femoral Vein: No evidence of thrombus. Normal compressibility and flow on color Doppler imaging. Femoral Vein: No evidence of thrombus. Normal compressibility, respiratory phasicity and response to augmentation. Popliteal Vein: No evidence of thrombus. Normal compressibility, respiratory phasicity and response to augmentation. Calf Veins: No evidence of thrombus. Normal compressibility and flow on color Doppler imaging. Superficial Great Saphenous Vein: No evidence of thrombus. Normal compressibility. Venous Reflux:  None. Other Findings:  None. IMPRESSION: No evidence of deep venous thrombosis in either lower extremity. Electronically Signed   By: Jacqulynn Cadet M.D.   On: 01/24/2022 16:37

## 2022-03-17 NOTE — Patient Instructions (Signed)
[facility] AT [place]  Discharge Instructions: Thank you for choosing [facility] to provide your oncology and hematology care.  If you have a lab appointment with the [facility], please go directly to the [facility] and check in at the registration area.  Wear comfortable clothing and clothing appropriate for easy access to any Portacath or PICC line.   We strive to give you quality time with your provider. You may need to reschedule your appointment if you arrive late (15 or more minutes).  Arriving late affects you and other patients whose appointments are after yours.  Also, if you miss three or more appointments without notifying the office, you may be dismissed from the clinic at the provider's discretion.      For prescription refill requests, have your pharmacy contact our office and allow 72 hours for refills to be completed.    Today you received the following chemotherapy and/or immunotherapy agents DARAZALEX      To help prevent nausea and vomiting after your treatment, we encourage you to take your nausea medication as directed.  BELOW ARE SYMPTOMS THAT SHOULD BE REPORTED IMMEDIATELY: *FEVER GREATER THAN 100.4 F (38 C) OR HIGHER *CHILLS OR SWEATING *NAUSEA AND VOMITING THAT IS NOT CONTROLLED WITH YOUR NAUSEA MEDICATION *UNUSUAL SHORTNESS OF BREATH *UNUSUAL BRUISING OR BLEEDING *URINARY PROBLEMS (pain or burning when urinating, or frequent urination) *BOWEL PROBLEMS (unusual diarrhea, constipation, pain near the anus) TENDERNESS IN MOUTH AND THROAT WITH OR WITHOUT PRESENCE OF ULCERS (sore throat, sores in mouth, or a toothache) UNUSUAL RASH, SWELLING OR PAIN  UNUSUAL VAGINAL DISCHARGE OR ITCHING   Items with * indicate a potential emergency and should be followed up as soon as possible or go to the Emergency Department if any problems should occur.  Please show the CHEMOTHERAPY ALERT CARD or IMMUNOTHERAPY ALERT CARD at check-in to  the Emergency Department and triage nurse.  Should you have questions after your visit or need to cancel or reschedule your appointment, please contact [facility] [place] AT [place]  [phone] and follow the prompts.  Office hours are 8:00 a.m. to 4:30 p.m. Monday - Friday. Please note that voicemails left after 4:00 p.m. may not be returned until the following business day.  We are closed weekends and major holidays. You have access to a nurse at all times for urgent questions. Please call the main number to the clinic [phone] and follow the prompts.  For any non-urgent questions, you may also contact your provider using MyChart. We now offer e-Visits for anyone 86 and older to request care online for non-urgent symptoms. For details visit [email or web address].   Also download the MyChart app! Go to the app store, search "MyChart", open the app, select [facility], and log in with your MyChart username and password.  Masks are optional in the cancer centers. If you would like for your care team to wear a mask while they are taking care of you, please let them know. For doctor visits, patients may have with them one support person who is at least 86 years old. At this time, visitors are not allowed in the infusion area.  Daratumumab Injection What is this medication? DARATUMUMAB (dar a toom ue mab) treats multiple myeloma, a type of bone marrow cancer. It works by helping your immune system slow or stop the spread of cancer cells. It is a monoclonal antibody. This medicine may be used for other purposes; ask your health care provider or pharmacist  if you have questions. COMMON BRAND NAME(S): DARZALEX What should I tell my care team before I take this medication? They need to know if you have any of these conditions: Hereditary fructose intolerance Infection, such as chickenpox, herpes, hepatitis B virus Lung or breathing disease, such as asthma, COPD An unusual or  allergic reaction to daratumumab, sorbitol, other medications, foods, dyes, or preservatives Pregnant or trying to get pregnant Breast-feeding How should I use this medication? This medication is injected into a vein. It is given by your care team in a hospital or clinic setting. Talk to your care team about the use of this medication in children. Special care may be needed. Overdosage: If you think you have taken too much of this medicine contact a poison control center or emergency room at once. NOTE: This medicine is only for you. Do not share this medicine with others. What if I miss a dose? Keep appointments for follow-up doses. It is important not to miss your dose. Call your care team if you are unable to keep an appointment. What may interact with this medication? Interactions have not been studied. This list may not describe all possible interactions. Give your health care provider a list of all the medicines, herbs, non-prescription drugs, or dietary supplements you use. Also tell them if you smoke, drink alcohol, or use illegal drugs. Some items may interact with your medicine. What should I watch for while using this medication? Your condition will be monitored carefully while you are receiving this medication. This medication can cause serious allergic reactions. To reduce your risk, your care team may give you other medication to take before receiving this one. Be sure to follow the directions from your care team. This medication can affect the results of blood tests to match your blood type. These changes can last for up to 6 months after the final dose. Your care team will do blood tests to match your blood type before you start treatment. Tell all of your care team that you are being treated with this medication before receiving a blood transfusion. This medication can affect the results of some tests used to determine treatment response; extra tests may be needed to evaluate  response. Talk to your care team if you wish to become pregnant or think you are pregnant. This medication can cause serious birth defects if taken during pregnancy and for 3 months after the last dose. A reliable form of contraception is recommended while taking this medication and for 3 months after the last dose. Talk to your care team about effective forms of contraception. Do not breast-feed while taking this medication. What side effects may I notice from receiving this medication? Side effects that you should report to your care team as soon as possible: Allergic reactions--skin rash, itching, hives, swelling of the face, lips, tongue, or throat Infection--fever, chills, cough, sore throat, wounds that don't heal, pain or trouble when passing urine, general feeling of discomfort or being unwell Infusion reactions--chest pain, shortness of breath or trouble breathing, feeling faint or lightheaded Unusual bruising or bleeding Side effects that usually do not require medical attention (report to your care team if they continue or are bothersome): Constipation Diarrhea Fatigue Nausea Pain, tingling, or numbness in the hands or feet Swelling of the ankles, hands, or feet This list may not describe all possible side effects. Call your doctor for medical advice about side effects. You may report side effects to FDA at 1-800-FDA-1088. Where should I keep my  medication? This medication is given in a hospital or clinic. It will not be stored at home. NOTE: This sheet is a summary. It may not cover all possible information. If you have questions about this medicine, talk to your doctor, pharmacist, or health care provider.  2023 Elsevier/Gold Standard (2014-06-12 00:00:00)   

## 2022-03-17 NOTE — Progress Notes (Signed)
Pt in for follow up, denies any concerns today. 

## 2022-03-17 NOTE — Assessment & Plan Note (Signed)
Continue monitor

## 2022-03-17 NOTE — Assessment & Plan Note (Addendum)
Multiple myeloma, IgA kappa Labs reviewed and discussed with Proceed with D1 Daratumumab.   Revlimid D1-21 Patient takes dexamethasone 20 mg weekly Continue asprin 68m daily and Acyclovir 4058mBID.

## 2022-03-18 ENCOUNTER — Telehealth: Payer: Self-pay | Admitting: *Deleted

## 2022-03-18 LAB — KAPPA/LAMBDA LIGHT CHAINS
Kappa free light chain: 16.8 mg/L (ref 3.3–19.4)
Kappa, lambda light chain ratio: 2.27 — ABNORMAL HIGH (ref 0.26–1.65)
Lambda free light chains: 7.4 mg/L (ref 5.7–26.3)

## 2022-03-18 NOTE — Telephone Encounter (Signed)
Daughter Levada Dy called requesting to speak with Benjamine Mola regarding patient Dexamethasone. [phone]

## 2022-03-18 NOTE — Telephone Encounter (Signed)
Spoke to Lynn, who wanted clarification on when to take Dex since pt's tx date has changed to Monday. Per Dr. Tasia Catchings ok to continue taking dex weekly, Monday or tues will be fine. Levada Dy verbalized understanding.

## 2022-03-20 ENCOUNTER — Ambulatory Visit
Admission: RE | Admit: 2022-03-20 | Discharge: 2022-03-20 | Disposition: A | Discharge status: Home / Self Care | Payer: Medicare Other | Source: Ambulatory Visit | Attending: Internal Medicine | Admitting: Internal Medicine

## 2022-03-20 DIAGNOSIS — M85852 Other specified disorders of bone density and structure, left thigh: Secondary | ICD-10-CM | POA: Diagnosis not present

## 2022-03-20 DIAGNOSIS — E2839 Other primary ovarian failure: Secondary | ICD-10-CM | POA: Diagnosis not present

## 2022-03-22 ENCOUNTER — Other Ambulatory Visit: Payer: Self-pay

## 2022-03-24 LAB — MULTIPLE MYELOMA PANEL, SERUM
Albumin SerPl Elph-Mcnc: 3.2 g/dL (ref 2.9–4.4)
Albumin/Glob SerPl: 1.3 (ref 0.7–1.7)
Alpha 1: 0.2 g/dL (ref 0.0–0.4)
Alpha2 Glob SerPl Elph-Mcnc: 0.7 g/dL (ref 0.4–1.0)
B-Globulin SerPl Elph-Mcnc: 1.2 g/dL (ref 0.7–1.3)
Gamma Glob SerPl Elph-Mcnc: 0.4 g/dL (ref 0.4–1.8)
Globulin, Total: 2.5 g/dL (ref 2.2–3.9)
IgA: 438 mg/dL — ABNORMAL HIGH (ref 64–422)
IgG (Immunoglobin G), Serum: 454 mg/dL — ABNORMAL LOW (ref 586–1602)
IgM (Immunoglobulin M), Srm: 8 mg/dL — ABNORMAL LOW (ref 26–217)
M Protein SerPl Elph-Mcnc: 0.2 g/dL — ABNORMAL HIGH
Total Protein ELP: 5.7 g/dL — ABNORMAL LOW (ref 6.0–8.5)

## 2022-03-25 ENCOUNTER — Encounter: Payer: Self-pay | Admitting: Internal Medicine

## 2022-03-25 MED ORDER — HYDROCHLOROTHIAZIDE 25 MG PO TABS: ORAL_TABLET | ORAL | 1 refills | Status: DC

## 2022-03-25 MED ORDER — METOPROLOL SUCCINATE ER 25 MG PO TB24: 50.0000 mg | ORAL_TABLET | Freq: Every day | ORAL | 1 refills | Status: DC

## 2022-03-25 NOTE — Telephone Encounter (Signed)
I do not mind refilling medication, but Please confirm dose taking.  Metoprolol states '25mg'$  tablet, but report states taking '50mg'$  per day.  Also clarify hctz 12.'5mg'$  q day.

## 2022-03-25 NOTE — Telephone Encounter (Signed)
Rx sent in for metoprolol and hctz.

## 2022-03-25 NOTE — Telephone Encounter (Signed)
Pt is taking Metoprolol '25mg'$ -2 PO QD ('50mg'$  total)  HCTZ '25mg'$ -12.5 PO QD  Pharmacy- [place]

## 2022-04-07 ENCOUNTER — Other Ambulatory Visit: Payer: Self-pay

## 2022-04-07 ENCOUNTER — Other Ambulatory Visit: Payer: Self-pay | Admitting: *Deleted

## 2022-04-07 DIAGNOSIS — C9000 Multiple myeloma not having achieved remission: Secondary | ICD-10-CM

## 2022-04-07 MED ORDER — LENALIDOMIDE 10 MG PO CAPS: 10.0000 mg | ORAL_CAPSULE | Freq: Every day | ORAL | 0 refills | Status: DC

## 2022-04-07 NOTE — Telephone Encounter (Signed)
Revlimid rx sent

## 2022-04-08 ENCOUNTER — Encounter: Payer: Self-pay | Admitting: Oncology

## 2022-04-14 ENCOUNTER — Inpatient Hospital Stay: Payer: Medicare Other

## 2022-04-14 ENCOUNTER — Inpatient Hospital Stay (HOSPITAL_BASED_OUTPATIENT_CLINIC_OR_DEPARTMENT_OTHER): Payer: Medicare Other | Admitting: Oncology

## 2022-04-14 ENCOUNTER — Encounter: Payer: Self-pay | Admitting: Oncology

## 2022-04-14 ENCOUNTER — Inpatient Hospital Stay: Payer: Medicare Other | Attending: Oncology

## 2022-04-14 VITALS — BP 139/77 | HR 56 | Temp 97.7°F | Resp 18

## 2022-04-14 VITALS — BP 160/83 | HR 58 | Temp 98.7°F | Resp 18 | Wt 137.1 lb

## 2022-04-14 DIAGNOSIS — Z79899 Other long term (current) drug therapy: Secondary | ICD-10-CM | POA: Insufficient documentation

## 2022-04-14 DIAGNOSIS — M899 Disorder of bone, unspecified: Secondary | ICD-10-CM | POA: Diagnosis not present

## 2022-04-14 DIAGNOSIS — C9000 Multiple myeloma not having achieved remission: Secondary | ICD-10-CM | POA: Insufficient documentation

## 2022-04-14 DIAGNOSIS — D649 Anemia, unspecified: Secondary | ICD-10-CM

## 2022-04-14 DIAGNOSIS — Z79624 Long term (current) use of inhibitors of nucleotide synthesis: Secondary | ICD-10-CM | POA: Insufficient documentation

## 2022-04-14 DIAGNOSIS — Z7982 Long term (current) use of aspirin: Secondary | ICD-10-CM | POA: Diagnosis not present

## 2022-04-14 DIAGNOSIS — Z7952 Long term (current) use of systemic steroids: Secondary | ICD-10-CM | POA: Insufficient documentation

## 2022-04-14 DIAGNOSIS — Z8249 Family history of ischemic heart disease and other diseases of the circulatory system: Secondary | ICD-10-CM | POA: Diagnosis not present

## 2022-04-14 DIAGNOSIS — Z7961 Long term (current) use of immunomodulator: Secondary | ICD-10-CM | POA: Insufficient documentation

## 2022-04-14 DIAGNOSIS — Z823 Family history of stroke: Secondary | ICD-10-CM | POA: Insufficient documentation

## 2022-04-14 DIAGNOSIS — Z8261 Family history of arthritis: Secondary | ICD-10-CM | POA: Diagnosis not present

## 2022-04-14 DIAGNOSIS — R5383 Other fatigue: Secondary | ICD-10-CM | POA: Insufficient documentation

## 2022-04-14 DIAGNOSIS — Z5111 Encounter for antineoplastic chemotherapy: Secondary | ICD-10-CM

## 2022-04-14 DIAGNOSIS — Z803 Family history of malignant neoplasm of breast: Secondary | ICD-10-CM | POA: Diagnosis not present

## 2022-04-14 DIAGNOSIS — I10 Essential (primary) hypertension: Secondary | ICD-10-CM | POA: Insufficient documentation

## 2022-04-14 DIAGNOSIS — E7800 Pure hypercholesterolemia, unspecified: Secondary | ICD-10-CM | POA: Insufficient documentation

## 2022-04-14 DIAGNOSIS — Z5112 Encounter for antineoplastic immunotherapy: Secondary | ICD-10-CM | POA: Insufficient documentation

## 2022-04-14 LAB — CBC WITH DIFFERENTIAL/PLATELET
Abs Immature Granulocytes: 0.01 10*3/uL (ref 0.00–0.07)
Basophils Absolute: 0.1 10*3/uL (ref 0.0–0.1)
Basophils Relative: 3 %
Eosinophils Absolute: 0.1 10*3/uL (ref 0.0–0.5)
Eosinophils Relative: 3 %
HCT: 30.7 % — ABNORMAL LOW (ref 36.0–46.0)
Hemoglobin: 9.8 g/dL — ABNORMAL LOW (ref 12.0–15.0)
Immature Granulocytes: 0 %
Lymphocytes Relative: 39 %
Lymphs Abs: 1.6 10*3/uL (ref 0.7–4.0)
MCH: 31.2 pg (ref 26.0–34.0)
MCHC: 31.9 g/dL (ref 30.0–36.0)
MCV: 97.8 fL (ref 80.0–100.0)
Monocytes Absolute: 0.3 10*3/uL (ref 0.1–1.0)
Monocytes Relative: 8 %
Neutro Abs: 2 10*3/uL (ref 1.7–7.7)
Neutrophils Relative %: 47 %
Platelets: 217 10*3/uL (ref 150–400)
RBC: 3.14 MIL/uL — ABNORMAL LOW (ref 3.87–5.11)
RDW: 15.8 % — ABNORMAL HIGH (ref 11.5–15.5)
WBC: 4.2 10*3/uL (ref 4.0–10.5)
nRBC: 0 % (ref 0.0–0.2)

## 2022-04-14 LAB — COMPREHENSIVE METABOLIC PANEL
ALT: 16 U/L (ref 0–44)
AST: 24 U/L (ref 15–41)
Albumin: 3.4 g/dL — ABNORMAL LOW (ref 3.5–5.0)
Alkaline Phosphatase: 43 U/L (ref 38–126)
Anion gap: 6 (ref 5–15)
BUN: 18 mg/dL (ref 8–23)
CO2: 25 mmol/L (ref 22–32)
Calcium: 9 mg/dL (ref 8.9–10.3)
Chloride: 108 mmol/L (ref 98–111)
Creatinine, Ser: 1.11 mg/dL — ABNORMAL HIGH (ref 0.44–1.00)
GFR, Estimated: 48 mL/min — ABNORMAL LOW (ref >60)
Glucose, Bld: 87 mg/dL (ref 70–99)
Potassium: 3.8 mmol/L (ref 3.5–5.1)
Sodium: 139 mmol/L (ref 135–145)
Total Bilirubin: 0.4 mg/dL (ref 0.3–1.2)
Total Protein: 6.3 g/dL — ABNORMAL LOW (ref 6.5–8.1)

## 2022-04-14 MED ORDER — DEXAMETHASONE 4 MG PO TABS: 20.0000 mg | ORAL_TABLET | ORAL | 0 refills | Status: DC

## 2022-04-14 MED ORDER — ACETAMINOPHEN 325 MG PO TABS
650.0000 mg | ORAL_TABLET | Freq: Once | ORAL | Status: AC
  Administered 2022-04-14: 650 mg via ORAL

## 2022-04-14 MED ORDER — SODIUM CHLORIDE 0.9 % IV SOLN
INTRAVENOUS | Status: DC
  Filled 2022-04-14: qty 250

## 2022-04-14 MED ORDER — DIPHENHYDRAMINE HCL 25 MG PO CAPS
50.0000 mg | ORAL_CAPSULE | Freq: Once | ORAL | Status: AC
  Administered 2022-04-14: 50 mg via ORAL
  Filled 2022-04-14: qty 2

## 2022-04-14 MED ORDER — POTASSIUM CHLORIDE CRYS ER 10 MEQ PO TBCR: 20.0000 meq | EXTENDED_RELEASE_TABLET | Freq: Every day | ORAL | 1 refills | Status: DC

## 2022-04-14 MED ORDER — ACETAMINOPHEN 325 MG PO TABS
ORAL_TABLET | ORAL | Status: AC
  Filled 2022-04-14: qty 2

## 2022-04-14 MED ORDER — ZOLEDRONIC ACID 4 MG/5ML IV CONC
3.3000 mg | Freq: Once | INTRAVENOUS | Status: AC
  Administered 2022-04-14: 3.3 mg via INTRAVENOUS
  Filled 2022-04-14: qty 4.13

## 2022-04-14 MED ORDER — DARATUMUMAB-HYALURONIDASE-FIHJ 1800-30000 MG-UT/15ML ~~LOC~~ SOLN
1800.0000 mg | Freq: Once | SUBCUTANEOUS | Status: AC
  Administered 2022-04-14: 1800 mg via SUBCUTANEOUS
  Filled 2022-04-14: qty 15

## 2022-04-14 NOTE — Assessment & Plan Note (Addendum)
Zometa monthly, hold if calcium is low.  Recommend calcium 1200 mg daily. 

## 2022-04-14 NOTE — Assessment & Plan Note (Signed)
Multiple myeloma, IgA kappa Labs reviewed and discussed with Proceed with D1 Daratumumab.   Revlimid D1-21 Patient takes dexamethasone 20 mg weekly Continue asprin 41m daily and Acyclovir 4070mBID.

## 2022-04-14 NOTE — Patient Instructions (Signed)
[facility]  Discharge Instructions: Thank you for choosing [facility] to provide your oncology and hematology care.  If you have a lab appointment with the Cancer Center, please go directly to the Cancer Center and check in at the registration area.  Wear comfortable clothing and clothing appropriate for easy access to any Portacath or PICC line.   We strive to give you quality time with your provider. You may need to reschedule your appointment if you arrive late (15 or more minutes).  Arriving late affects you and other patients whose appointments are after yours.  Also, if you miss three or more appointments without notifying the office, you may be dismissed from the clinic at the provider's discretion.      For prescription refill requests, have your pharmacy contact our office and allow 72 hours for refills to be completed.    Today you received the following chemotherapy and/or immunotherapy agents: Darzalex Faspro      To help prevent nausea and vomiting after your treatment, we encourage you to take your nausea medication as directed.  BELOW ARE SYMPTOMS THAT SHOULD BE REPORTED IMMEDIATELY: *FEVER GREATER THAN 100.4 F (38 C) OR HIGHER *CHILLS OR SWEATING *NAUSEA AND VOMITING THAT IS NOT CONTROLLED WITH YOUR NAUSEA MEDICATION *UNUSUAL SHORTNESS OF BREATH *UNUSUAL BRUISING OR BLEEDING *URINARY PROBLEMS (pain or burning when urinating, or frequent urination) *BOWEL PROBLEMS (unusual diarrhea, constipation, pain near the anus) TENDERNESS IN MOUTH AND THROAT WITH OR WITHOUT PRESENCE OF ULCERS (sore throat, sores in mouth, or a toothache) UNUSUAL RASH, SWELLING OR PAIN  UNUSUAL VAGINAL DISCHARGE OR ITCHING   Items with * indicate a potential emergency and should be followed up as soon as possible or go to the Emergency Department if any problems should occur.  Please show the CHEMOTHERAPY ALERT CARD or IMMUNOTHERAPY ALERT CARD at  check-in to the Emergency Department and triage nurse.  Should you have questions after your visit or need to cancel or reschedule your appointment, please contact [facility]  [phone] and follow the prompts.  Office hours are 8:00 a.m. to 4:30 p.m. Monday - Friday. Please note that voicemails left after 4:00 p.m. may not be returned until the following business day.  We are closed weekends and major holidays. You have access to a nurse at all times for urgent questions. Please call the main number to the clinic [phone] and follow the prompts.  For any non-urgent questions, you may also contact your provider using MyChart. We now offer e-Visits for anyone 18 and older to request care online for non-urgent symptoms. For details visit [email or web address].   Also download the MyChart app! Go to the app store, search "MyChart", open the app, select [place], and log in with your MyChart username and password.  Masks are optional in the cancer centers. If you would like for your care team to wear a mask while they are taking care of you, please let them know. For doctor visits, patients may have with them one support person who is at least 86 years old. At this time, visitors are not allowed in the infusion area.   

## 2022-04-14 NOTE — Progress Notes (Signed)
Pt here for follow up. No new concerns voiced.   

## 2022-04-14 NOTE — Assessment & Plan Note (Signed)
Chemotherapy plan as listed as above. 

## 2022-04-14 NOTE — Assessment & Plan Note (Signed)
Continue monitor

## 2022-04-14 NOTE — Progress Notes (Signed)
Hematology/Oncology Progress note [phone] [phone]         Patient Care Team: Einar Pheasant, MD as PCP - General (Internal Medicine)  ASSESSMENT & PLAN:   Cancer Staging  Multiple myeloma [facility]) Staging form: Plasma Cell Myeloma and Plasma Cell Disorders, AJCC 8th Edition - Clinical stage from 08/22/2021: RISS Stage II (Beta-2-microglobulin (mg/L): 4.1, Albumin (g/dL): 2.8, ISS: Stage II, High-risk cytogenetics: Absent, LDH: Normal) - Signed by Earlie Server, MD on 09/06/2021   Multiple myeloma ([place]) Multiple myeloma, IgA kappa Labs reviewed and discussed with Proceed with D1 Daratumumab.   Revlimid D1-21 Patient takes dexamethasone 20 mg weekly Continue asprin 1m daily and Acyclovir 4019mBID.   Encounter for antineoplastic chemotherapy Chemotherapy plan as listed as above.  Bone lesion Zometa monthly, hold if calcium is low.  Recommend calcium 1200 mg daily.  Anemia Continue monitor.   Orders Placed This Encounter  Procedures   Kappa/lambda light chains    Standing Status:   Future    Standing Expiration Date:   09/02/2023   Multiple Myeloma Panel (SPEP&IFE w/QIG)    Standing Status:   Future    Standing Expiration Date:   09/02/2023   Comprehensive metabolic panel    Standing Status:   Future    Standing Expiration Date:   09/01/2023   CBC with Differential    Standing Status:   Future    Standing Expiration Date:   09/02/2023    Follow up   Lab/MD/Dara/Zometa in 4 weeks  All questions were answered. The patient knows to call the clinic with any problems, questions or concerns.  ZhEarlie ServerMD, PhD [facility] 04/14/2022     CHIEF COMPLAINTS/REASON FOR VISIT:  Follow-up for multiple myeloma  HISTORY OF PRESENTING ILLNESS:   Stephanie Delacruz a  [age].o.  female with PMH listed below was seen in consultation at the request of  ScEinar PheasantMD  for evaluation of anemia 07/15/2021, patient had CBC checked at the  primary care provider's office.  CBC showed hemoglobin 7.7, hematocrit 23.9, white count 3.6, RDW 16.5.  Platelet count 166. Reviewed previous lab records.  Patient has chronic anemia dating back at least 2016. Her baseline prior to 2018 was about 10.   Hemoglobin was 9.1 in March 2022, and further dropped to 8.1 in October 2022, then November dropped to 7.7. Patient denies being significantly affected, tired, lightheadedness, shortness of breath.  Appetite is fair.  Denies any black or bloody stool.   Denies any history of hemoglobinopathy  08/26/2021 bone marrow biopsy showed hypercellular marrow with plasma cell neoplasm.  67% plasma cells in the aspirate.  Cytogenetics showed complex hyperplasia, gain of 1 q.  # 08/26/2021 started on Daratumumab+ dexamethasone # 09/06/2021 added Revlimid 1035m 14 days.  #09/27/2021 till present, cycle 2 Revlimid 10 mg for 21 days.  INTERVAL HISTORY Stephanie Delacruz a [age] female who has above history reviewed by me today presents for follow up visit for management of multiple myeloma Patient tolerates daratumumab -RD regimen well. She was accompanied by her daughter today.  She denies any nausea vomiting diarrhea.     Review of Systems  Constitutional:  Positive for fatigue. Negative for appetite change, chills and fever.  HENT:   Negative for hearing loss and voice change.   Eyes:  Negative for eye problems.  Respiratory:  Negative for chest tightness and cough.   Cardiovascular:  Negative for chest pain.  Gastrointestinal:  Negative  for abdominal distention, abdominal pain and blood in stool.  Endocrine: Negative for hot flashes.  Genitourinary:  Negative for difficulty urinating and frequency.   Musculoskeletal:  Positive for back pain. Negative for arthralgias.  Skin:  Negative for itching and rash.  Neurological:  Negative for extremity weakness.  Hematological:  Negative for adenopathy.  Psychiatric/Behavioral:  Negative for confusion.      MEDICAL HISTORY:  Past Medical History:  Diagnosis Date   Anemia    GERD (gastroesophageal reflux disease)    Hypercholesterolemia    Hypertension    Renal insufficiency     SURGICAL HISTORY: Past Surgical History:  Procedure Laterality Date   TONSILLECTOMY     TUBAL LIGATION      SOCIAL HISTORY: Social History   Socioeconomic History   Marital status: Married    Spouse name: Not on file   Number of children: Not on file   Years of education: Not on file   Highest education level: Not on file  Occupational History   Not on file  Tobacco Use   Smoking status: Never   Smokeless tobacco: Never  Substance and Sexual Activity   Alcohol use: No   Drug use: No   Sexual activity: Not on file  Other Topics Concern   Not on file  Social History Narrative   Not on file   Social Determinants of Health   Financial Resource Strain: Not on file  Food Insecurity: Not on file  Transportation Needs: Not on file  Physical Activity: Not on file  Stress: Not on file  Social Connections: Not on file  Intimate Partner Violence: Not on file    FAMILY HISTORY: Family History  Problem Relation Age of Onset   Hypertension Mother    Arthritis Mother    Hypertension Father    Stroke Father    Breast cancer Niece     ALLERGIES:  has No Known Allergies.  MEDICATIONS:  Current Outpatient Medications  Medication Sig Dispense Refill   acyclovir (ZOVIRAX) 400 MG tablet Take 1 tablet (400 mg total) by mouth 2 (two) times daily. 60 tablet 11   amLODipine (NORVASC) 2.5 MG tablet Take 5 mg by mouth daily.     ascorbic acid (VITAMIN C) 250 MG tablet daily Take 2 tablets every day by oral route.     aspirin 81 MG EC tablet Take 81 mg by mouth daily.     Calcium Carb-Cholecalciferol (CALCIUM 600+D) 600-20 MG-MCG TABS Take 1 tablet by mouth 3 (three) times daily.     donepezil (ARICEPT) 10 MG tablet Take 1 tablet (10 mg total) by mouth daily. 90 tablet 2   fenofibrate (TRICOR) 48  MG tablet Take 1 tablet (48 mg total) by mouth daily. 30 tablet 3   fexofenadine (ALLEGRA) 180 MG tablet Take by mouth.     hydrochlorothiazide (HYDRODIURIL) 25 MG tablet 1/2 tablet q day 45 tablet 1   lenalidomide (REVLIMID) 10 MG capsule Take 1 capsule (10 mg total) by mouth daily. Take for 21 days, then hold for 7 days. Repeat every 28 days. 21 capsule 0   losartan (COZAAR) 100 MG tablet Take 1 tablet (100 mg total) by mouth daily. 90 tablet 2   magic mouthwash (multi-ingredient) oral suspension Swish and swallow 5-35m four times a day as needed for mouth pain 480 mL 1   memantine (NAMENDA) 5 MG tablet Take 5 mg by mouth 2 (two) times daily.     metoprolol succinate (TOPROL-XL) 25 MG 24 hr tablet  Take 2 tablets (50 mg total) by mouth daily. 180 tablet 1   montelukast (SINGULAIR) 10 MG tablet Take 1 tablet (10 mg total) by mouth See admin instructions. Take 1 tablet one day before Daratumumab injection. 60 tablet 0   Multiple Vitamins-Minerals (WOMENS MULTIVITAMIN PO) Take 1 tablet by mouth daily.     omeprazole (PRILOSEC) 20 MG capsule TAKE 1 CAPSULE(20 MG) BY MOUTH DAILY 90 capsule 3   dexamethasone (DECADRON) 4 MG tablet Take 5 tablets (20 mg total) by mouth once a week. 60 tablet 0   potassium chloride (KLOR-CON M) 10 MEQ tablet Take 2 tablets (20 mEq total) by mouth daily. 60 tablet 1   No current facility-administered medications for this visit.   Facility-Administered Medications Ordered in Other Visits  Medication Dose Route Frequency Provider Last Rate Last Admin   0.9 %  sodium chloride infusion   Intravenous Continuous Earlie Server, MD   Stopped at 04/14/22 1117   acetaminophen (TYLENOL) 325 MG tablet              PHYSICAL EXAMINATION: ECOG PERFORMANCE STATUS: 1 - Symptomatic but completely ambulatory Vitals:   04/14/22 0934  BP: (!) 160/83  Pulse: (!) 58  Resp: 18  Temp: 98.7 F (37.1 C)   Filed Weights   04/14/22 0934  Weight: 137 lb 1.6 oz (62.2 kg)    Physical  Exam Constitutional:      General: She is not in acute distress. HENT:     Head: Normocephalic and atraumatic.  Eyes:     General: No scleral icterus. Cardiovascular:     Rate and Rhythm: Normal rate and regular rhythm.     Heart sounds: Normal heart sounds.  Pulmonary:     Effort: Pulmonary effort is normal. No respiratory distress.     Breath sounds: No wheezing.  Abdominal:     General: Bowel sounds are normal. There is no distension.     Palpations: Abdomen is soft.  Musculoskeletal:        General: No deformity. Normal range of motion.     Cervical back: Normal range of motion and neck supple.  Skin:    General: Skin is warm and dry.     Findings: No erythema or rash.  Neurological:     Mental Status: She is alert and oriented to person, place, and time. Mental status is at baseline.     Cranial Nerves: No cranial nerve deficit.     Coordination: Coordination normal.  Psychiatric:        Mood and Affect: Mood normal.     LABORATORY DATA:  I have reviewed the data as listed    Latest Ref Rng & Units 04/14/2022    9:08 AM 03/17/2022    8:56 AM 02/14/2022   10:23 AM  CBC  WBC 4.0 - 10.5 K/uL 4.2  6.1  3.1   Hemoglobin 12.0 - 15.0 g/dL 9.8  9.1  10.1   Hematocrit 36.0 - 46.0 % 30.7  28.6  31.2   Platelets 150 - 400 K/uL 217  218  236       Latest Ref Rng & Units 04/14/2022    9:08 AM 03/17/2022    8:56 AM 02/14/2022   10:23 AM  CMP  Glucose 70 - 99 mg/dL 87  80  96   BUN 8 - 23 mg/dL '18  15  14   ' Creatinine 0.44 - 1.00 mg/dL 1.11  0.90  0.97   Sodium 135 - 145 mmol/L  139  140  137   Potassium 3.5 - 5.1 mmol/L 3.8  3.1  4.1   Chloride 98 - 111 mmol/L 108  108  109   CO2 22 - 32 mmol/L '25  26  24   ' Calcium 8.9 - 10.3 mg/dL 9.0  8.5  8.9   Total Protein 6.5 - 8.1 g/dL 6.3  6.2  6.4   Total Bilirubin 0.3 - 1.2 mg/dL 0.4  0.5  0.5   Alkaline Phos 38 - 126 U/L 43  46  47   AST 15 - 41 U/L 24  24  32   ALT 0 - 44 U/L '16  19  24      ' Iron/TIBC/Ferritin/ %Sat     Component Value Date/Time   IRON 54 07/24/2021 1212   TIBC 277 07/24/2021 1212   FERRITIN 93 07/24/2021 1212   IRONPCTSAT 20 07/24/2021 1212       RADIOGRAPHIC STUDIES: I have personally reviewed the radiological images as listed and agreed with the findings in the report. DG Bone Density  Result Date: 03/20/2022 EXAM: DUAL X-RAY ABSORPTIOMETRY (DXA) FOR BONE MINERAL DENSITY IMPRESSION: Your patient Gailene Youkhana completed a BMD test on 03/20/2022 using the [employer] (analysis version: 14.10) manufactured by [employer]. The following summarizes the results of our evaluation. Technologist: LCE PATIENT BIOGRAPHICAL: Name: Estephani, Popper Patient ID: [id number] Birth Date: 06-21-1934 Height: 61.0 in. Gender: Female Exam Date: 03/20/2022 Weight: 141.8 lbs. Indications: Advanced Age, Early Menopause, Multiple Myeloma, POSTmenopausal Fractures: Treatments: Aspirin, Calcium, Reclast, Revlimid, Vitamin D ASSESSMENT: The BMD measured at Femur Neck Left is 0.736 g/cm2 with a T-score of -2.2. This patient is considered osteopenic according to [facility]) criteria. L-2 and L-3 were excluded due to degenerative changes. The scan quality is good. Site Region Measured Measured WHO Young Adult BMD Date       Age      Classification T-score AP Spine L1-L4 (L2,L3) 5/20/[age] 86.2 Normal -0.3 1.146 g/cm2 DualFemur Neck Left 03/20/2022 86.2 Osteopenia -2.2 0.736 g/cm2 World Health Organization [facility]) criteria for post-menopausal, Caucasian Women: Normal:       T-score at or above -1 SD Osteopenia:   T-score between -1 and -2.5 SD Osteoporosis: T-score at or below -2.5 SD RECOMMENDATIONS: 1. All patients should optimize calcium and vitamin D intake. 2. Consider FDA-approved medical therapies in postmenopausal women and men aged 69 years and older, based on the following: a. A hip or vertebral (clinical or morphometric) fracture b. T-score < -2.5 at the femoral neck or spine after  appropriate evaluation to exclude secondary causes c. Low bone mass (T-score between -1.0 and -2.5 at the femoral neck or spine) and a 10-year probability of a hip fracture > 3% or a 10-year probability of a major osteoporosis-related fracture > 20% based on the [place]-adapted WHO algorithm d. Clinician judgment and/or patient preferences may indicate treatment for people with 10-year fracture probabilities above or below these levels FOLLOW-UP: People with diagnosed cases of osteoporosis or at high risk for fracture should have regular bone mineral density tests. For patients eligible for Medicare, routine testing is allowed once every 2 years. The testing frequency can be increased to one year for patients who have rapidly progressing disease, those who are receiving or discontinuing medical therapy to restore bone mass, or have additional risk factors. I have reviewed this report, and agree with the above findings. [facility] Radiology Your patient JACQUELYN SHADRICK completed a FRAX assessment on 03/20/2022  using the [place] (analysis version: 14.10) manufactured by [employer]. The following summarizes the results of our evaluation. Technologist: LCE PATIENT BIOGRAPHICAL: Name: Eveleen, Mcnear Patient ID: [id number] Birth Date: Sep 30, 1933 Height:    61.0 in. Gender:     [age]    [age]       Weight:    141.8 lbs. Ethnicity:  Black                            Exam Date: 03/20/2022 FRAX* RESULTS:  (version: 3.5) 10-year Probability of Fracture1 Major Osteoporotic Fracture2 Hip Fracture 7.6% 2.5% Population: [place] (Black) Risk Factors: None Based on Femur (Left) Neck BMD 1 -The 10-year probability of fracture may be lower than reported if the patient has received treatment. 2 -Major Osteoporotic Fracture: Clinical Spine, Forearm, Hip or Shoulder *FRAX is a Materials engineer of the [employer] for Metabolic Bone Disease, a [place] (WHO)  [facility]. ASSESSMENT: The probability of a major osteoporotic fracture is 7.6% within the next ten years. The probability of a hip fracture is 2.5% within the next ten years. Electronically Signed   By: Ammie Ferrier M.D.   On: 03/20/2022 11:24   VAS [place] LOWER EXTREMITY VENOUS REFLUX  Result Date: 03/10/2022  Lower Venous Reflux Study Patient Name:  KEEVA REISEN  Date of Exam:   03/05/2022 Medical Rec #: [id number]     Accession #:    [id number] Date of Birth: 1933/12/29      Patient Gender: F Patient Age:   44 years Exam Location:  [facility] Procedure:      VAS [place] LOWER EXTREMITY VENOUS REFLUX Referring Phys: Eulogio Ditch --------------------------------------------------------------------------------  Indications: Swelling.  Performing Technologist: Almira Coaster RVS  Examination Guidelines: A complete evaluation includes B-mode imaging, spectral Doppler, color Doppler, and power Doppler as needed of all accessible portions of each vessel. Bilateral testing is considered an integral part of a complete examination. Limited examinations for reoccurring indications may be performed as noted. The reflux portion of the exam is performed with the patient in reverse Trendelenburg. Significant venous reflux is defined as >500 ms in the superficial venous system, and >1 second in the deep venous system.  Venous Reflux Times +--------------+---------+------+-----------+------------+--------+ RIGHT         Reflux NoRefluxReflux TimeDiameter cmsComments                         Yes                                  +--------------+---------+------+-----------+------------+--------+ CFV           no                                             +--------------+---------+------+-----------+------------+--------+ FV prox       no                                             +--------------+---------+------+-----------+------------+--------+ FV mid        no                                              +--------------+---------+------+-----------+------------+--------+  FV dist       no                                             +--------------+---------+------+-----------+------------+--------+ Popliteal     no                                             +--------------+---------+------+-----------+------------+--------+ GSV at SFJ    no                            .43              +--------------+---------+------+-----------+------------+--------+ GSV prox thighno                            .43              +--------------+---------+------+-----------+------------+--------+ GSV mid thigh no                            .38              +--------------+---------+------+-----------+------------+--------+ GSV dist thighno                            .33              +--------------+---------+------+-----------+------------+--------+ GSV at knee   no                            .25              +--------------+---------+------+-----------+------------+--------+ GSV prox calf no                            .23              +--------------+---------+------+-----------+------------+--------+ SSV Pop Fossa no                            .32              +--------------+---------+------+-----------+------------+--------+  +--------------+---------+------+-----------+------------+--------+ LEFT          Reflux NoRefluxReflux TimeDiameter cmsComments                         Yes                                  +--------------+---------+------+-----------+------------+--------+ CFV           no                                             +--------------+---------+------+-----------+------------+--------+ FV prox       no                                             +--------------+---------+------+-----------+------------+--------+  FV mid        no                                              +--------------+---------+------+-----------+------------+--------+ FV dist       no                                             +--------------+---------+------+-----------+------------+--------+ Popliteal               yes    1313 ms                       +--------------+---------+------+-----------+------------+--------+ GSV at SFJ    no                            .47              +--------------+---------+------+-----------+------------+--------+ GSV prox thigh                              .45              +--------------+---------+------+-----------+------------+--------+ GSV mid thigh no                            .41              +--------------+---------+------+-----------+------------+--------+ GSV dist thighno                            .27              +--------------+---------+------+-----------+------------+--------+ GSV at knee   no                            .27              +--------------+---------+------+-----------+------------+--------+ GSV prox calf no                            .23              +--------------+---------+------+-----------+------------+--------+ SSV Pop Fossa no                            .29              +--------------+---------+------+-----------+------------+--------+   Summary: Bilateral: - No evidence of deep vein thrombosis seen in the lower extremities, bilaterally, from the common femoral through the popliteal veins. - No evidence of superficial venous thrombosis in the lower extremities, bilaterally. - No evidence of superficial venous reflux seen in the greater saphenous veins bilaterally. - No evidence of superficial venous reflux seen in the short saphenous veins bilaterally.  Right: - There is no evidence of venous reflux seen in the right lower extremity.  Left: - Venous reflux is noted in the left popliteal vein.  *See table(s) above for measurements and observations. Electronically signed by Leotis Pain MD on 03/10/2022 at 2:03:11 PM.    Final  VAS [place] ABI WITH/WO TBI  Result Date: 02/03/2022  LOWER EXTREMITY DOPPLER STUDY Patient Name:  KALYNNE WOMAC  Date of Exam:   01/29/2022 Medical Rec #: [id number]     Accession #:    [id number] Date of Birth: 14-May-1934      Patient Gender: F Patient Age:   71 years Exam Location:  [facility] Procedure:      VAS [place] ABI WITH/WO TBI Referring Phys: Hortencia Pilar --------------------------------------------------------------------------------  Indications: Pulses not felt at the level of the ankle on the Right.  Performing Technologist: Almira Coaster RVS  Examination Guidelines: A complete evaluation includes at minimum, Doppler waveform signals and systolic blood pressure reading at the level of bilateral brachial, anterior tibial, and posterior tibial arteries, when vessel segments are accessible. Bilateral testing is considered an integral part of a complete examination. Photoelectric Plethysmograph (PPG) waveforms and toe systolic pressure readings are included as required and additional duplex testing as needed. Limited examinations for reoccurring indications may be performed as noted.  ABI Findings: +---------+------------------+-----+---------+--------+ Right    Rt Pressure (mmHg)IndexWaveform Comment  +---------+------------------+-----+---------+--------+ Brachial 153                                      +---------+------------------+-----+---------+--------+ ATA      238               1.56 triphasicNC       +---------+------------------+-----+---------+--------+ PTA      159               1.04 triphasic         +---------+------------------+-----+---------+--------+ Great Toe159               1.04 Normal            +---------+------------------+-----+---------+--------+ +---------+------------------+-----+---------+-------+ Left     Lt Pressure (mmHg)IndexWaveform Comment  +---------+------------------+-----+---------+-------+ Brachial 152                                     +---------+------------------+-----+---------+-------+ ATA      240               1.57 triphasicNC      +---------+------------------+-----+---------+-------+ PTA      169               1.10 triphasic        +---------+------------------+-----+---------+-------+ Great Toe179               1.17 Normal           +---------+------------------+-----+---------+-------+ +-------+-----------+-----------+------------+------------+ ABI/TBIToday's ABIToday's TBIPrevious ABIPrevious TBI +-------+-----------+-----------+------------+------------+ Right  >1.0 [place]    1.04                                +-------+-----------+-----------+------------+------------+ Left   >1.0     1.17                                +-------+-----------+-----------+------------+------------+  Summary: Right: Resting right ankle-brachial index indicates noncompressible right lower extremity arteries. The right toe-brachial index is normal. Left: Resting left ankle-brachial index indicates noncompressible left lower extremity arteries. The left toe-brachial index is normal.  *See table(s) above for measurements and observations.  Electronically signed by Hortencia Pilar  MD on 02/03/2022 at 5:17:39 PM.    Final    US Venous Img Lower Bilateral  Result Date: 01/24/2022 CLINICAL DATA:  Lower extremity edema EXAM: BILATERAL LOWER EXTREMITY VENOUS DOPPLER ULTRASOUND TECHNIQUE: Gray-scale sonography with graded compression, as well as color Doppler and duplex ultrasound were performed to evaluate the lower extremity deep venous systems from the level of the common femoral vein and including the common femoral, femoral, profunda femoral, popliteal and calf veins including the posterior tibial, peroneal and gastrocnemius veins when visible. The superficial great saphenous vein was also interrogated. Spectral  Doppler was utilized to evaluate flow at rest and with distal augmentation maneuvers in the common femoral, femoral and popliteal veins. COMPARISON:  None Available. FINDINGS: RIGHT LOWER EXTREMITY Common Femoral Vein: No evidence of thrombus. Normal compressibility, respiratory phasicity and response to augmentation. Saphenofemoral Junction: No evidence of thrombus. Normal compressibility and flow on color Doppler imaging. Profunda Femoral Vein: No evidence of thrombus. Normal compressibility and flow on color Doppler imaging. Femoral Vein: No evidence of thrombus. Normal compressibility, respiratory phasicity and response to augmentation. Popliteal Vein: No evidence of thrombus. Normal compressibility, respiratory phasicity and response to augmentation. Calf Veins: No evidence of thrombus. Normal compressibility and flow on color Doppler imaging. Superficial Great Saphenous Vein: No evidence of thrombus. Normal compressibility. Venous Reflux:  None. Other Findings:  None. LEFT LOWER EXTREMITY Common Femoral Vein: No evidence of thrombus. Normal compressibility, respiratory phasicity and response to augmentation. Saphenofemoral Junction: No evidence of thrombus. Normal compressibility and flow on color Doppler imaging. Profunda Femoral Vein: No evidence of thrombus. Normal compressibility and flow on color Doppler imaging. Femoral Vein: No evidence of thrombus. Normal compressibility, respiratory phasicity and response to augmentation. Popliteal Vein: No evidence of thrombus. Normal compressibility, respiratory phasicity and response to augmentation. Calf Veins: No evidence of thrombus. Normal compressibility and flow on color Doppler imaging. Superficial Great Saphenous Vein: No evidence of thrombus. Normal compressibility. Venous Reflux:  None. Other Findings:  None. IMPRESSION: No evidence of deep venous thrombosis in either lower extremity. Electronically Signed   By: Jacqulynn Cadet M.D.   On: 01/24/2022  16:37

## 2022-04-15 LAB — KAPPA/LAMBDA LIGHT CHAINS
Kappa free light chain: 17.6 mg/L (ref 3.3–19.4)
Kappa, lambda light chain ratio: 2.29 — ABNORMAL HIGH (ref 0.26–1.65)
Lambda free light chains: 7.7 mg/L (ref 5.7–26.3)

## 2022-04-18 LAB — MULTIPLE MYELOMA PANEL, SERUM
Albumin SerPl Elph-Mcnc: 3.1 g/dL (ref 2.9–4.4)
Albumin/Glob SerPl: 1.3 (ref 0.7–1.7)
Alpha 1: 0.2 g/dL (ref 0.0–0.4)
Alpha2 Glob SerPl Elph-Mcnc: 0.7 g/dL (ref 0.4–1.0)
B-Globulin SerPl Elph-Mcnc: 1.2 g/dL (ref 0.7–1.3)
Gamma Glob SerPl Elph-Mcnc: 0.4 g/dL (ref 0.4–1.8)
Globulin, Total: 2.5 g/dL (ref 2.2–3.9)
IgA: 400 mg/dL (ref 64–422)
IgG (Immunoglobin G), Serum: 457 mg/dL — ABNORMAL LOW (ref 586–1602)
IgM (Immunoglobulin M), Srm: 11 mg/dL — ABNORMAL LOW (ref 26–217)
M Protein SerPl Elph-Mcnc: 0.6 g/dL — ABNORMAL HIGH
Total Protein ELP: 5.6 g/dL — ABNORMAL LOW (ref 6.0–8.5)

## 2022-04-30 ENCOUNTER — Encounter: Payer: Self-pay | Admitting: Oncology

## 2022-04-30 ENCOUNTER — Encounter: Payer: Self-pay | Admitting: Internal Medicine

## 2022-04-30 NOTE — Telephone Encounter (Signed)
Just FYI. I called and spoke with patient's daughter & she is on [place] but other daughter was with patient. Her sx are mild with some slight cough & nasal congestion. I searched & we have no appointments available in office. Pt also lives in [place], & did not want to drive further out of town. She is scheduled at [facility] tomorrow just due to age may need evaluation.

## 2022-04-30 NOTE — Telephone Encounter (Signed)
Agree with need for evaluation.  

## 2022-05-01 ENCOUNTER — Ambulatory Visit
Admission: RE | Admit: 2022-05-01 | Discharge: 2022-05-01 | Disposition: A | Discharge status: Home / Self Care | Payer: Medicare Other | Source: Ambulatory Visit | Attending: Internal Medicine | Admitting: Internal Medicine

## 2022-05-01 VITALS — BP 152/76 | HR 67 | Temp 97.3°F | Resp 18

## 2022-05-01 DIAGNOSIS — U071 COVID-19: Secondary | ICD-10-CM

## 2022-05-01 NOTE — ED Triage Notes (Signed)
Tested positive for COVID.

## 2022-05-01 NOTE — ED Provider Notes (Signed)
[facility]    CSN: [id number] Arrival date & time: 05/01/22  1150      History   Chief Complaint Chief Complaint  Patient presents with   Appointment    1200    HPI Stephanie Delacruz is a 88 y.o. female.   HPI  Patient is accompanied by her daughter.  They present to urgent care with report of positive COVID test after 2 days of symptoms.  Symptoms started Tuesday (2 days ago).  She reports cough sometimes productive, runny nose, nasal congestion.  Denies fever, GI symptoms including nausea vomiting or diarrhea.  Denies shortness of breath or difficulty breathing.  Past Medical History:  Diagnosis Date   Anemia    GERD (gastroesophageal reflux disease)    Hypercholesterolemia    Hypertension    Renal insufficiency     Patient Active Problem List   Diagnosis Date Noted   Dementia ([place]) 01/29/2022   Swelling of lower extremity 11/30/2021   Absent pulse in lower extremity 11/30/2021   Allergic rhinitis 09/16/2021   Cataract 09/16/2021   Heart murmur 09/16/2021   Tension-type headache 09/16/2021   Anemia 08/30/2021   Stage 3a chronic kidney disease ([place]) 08/30/2021   Encounter for antineoplastic chemotherapy 08/30/2021   Cough 08/26/2021   Goals of care, counseling/discussion 08/22/2021   Multiple myeloma ([place]) 08/22/2021   Bone lesion 08/22/2021   Multiple myeloma not having achieved remission ([place]) 08/22/2021   Low back pain 06/29/2021   History of colon polyps 06/29/2021   History of dementia 06/29/2021   History of anemia 06/24/2021   Renal insufficiency 08/28/2018   Pure hypercholesterolemia 11/17/2015   Essential (primary) hypertension 11/17/2015   Anxiety 11/17/2015   Gastroesophageal reflux disease without esophagitis 11/17/2015   Shortness of breath 11/17/2015    Past Surgical History:  Procedure Laterality Date   TONSILLECTOMY     TUBAL LIGATION      OB History   No obstetric history on file.      Home Medications    Prior to  Admission medications   Medication Sig Start Date End Date Taking? Authorizing Provider  acyclovir (ZOVIRAX) 400 MG tablet Take 1 tablet (400 mg total) by mouth 2 (two) times daily. 08/22/21   Earlie Server, MD  amLODipine (NORVASC) 2.5 MG tablet Take 5 mg by mouth daily. 05/17/21   [provider]  ascorbic acid (VITAMIN C) 250 MG tablet daily Take 2 tablets every day by oral route.    [provider]  aspirin 81 MG EC tablet Take 81 mg by mouth daily.    [provider]  Calcium Carb-Cholecalciferol (CALCIUM 600+D) 600-20 MG-MCG TABS Take 1 tablet by mouth 3 (three) times daily.    [provider]  dexamethasone (DECADRON) 4 MG tablet Take 5 tablets (20 mg total) by mouth once a week. 04/14/22   Earlie Server, MD  donepezil (ARICEPT) 10 MG tablet Take 1 tablet (10 mg total) by mouth daily. 02/11/22   Einar Pheasant, MD  fenofibrate (TRICOR) 48 MG tablet Take 1 tablet (48 mg total) by mouth daily. 01/24/22   Einar Pheasant, MD  fexofenadine (ALLEGRA) 180 MG tablet Take by mouth.    [provider]  hydrochlorothiazide (HYDRODIURIL) 25 MG tablet 1/2 tablet q day 03/25/22   Einar Pheasant, MD  lenalidomide (REVLIMID) 10 MG capsule Take 1 capsule (10 mg total) by mouth daily. Take for 21 days, then hold for 7 days. Repeat every 28 days. 04/07/22   Earlie Server, MD  losartan (COZAAR) 100 MG tablet Take 1 tablet (100 mg total) by mouth daily. 02/05/22   Einar Pheasant, MD  magic mouthwash (multi-ingredient) oral suspension Swish and swallow 5-70ml four times a day as needed for mouth pain 12/04/21     memantine (NAMENDA) 5 MG tablet Take 5 mg by mouth 2 (two) times daily. 04/07/22   [provider]  metoprolol succinate (TOPROL-XL) 25 MG 24 hr tablet Take 2 tablets (50 mg total) by mouth daily. 03/25/22   Einar Pheasant, MD  montelukast (SINGULAIR) 10 MG tablet Take 1 tablet (10 mg total) by mouth See admin instructions. Take 1 tablet one day before Daratumumab  injection. 12/27/21   Earlie Server, MD  Multiple Vitamins-Minerals (WOMENS MULTIVITAMIN PO) Take 1 tablet by mouth daily.    [provider]  omeprazole (PRILOSEC) 20 MG capsule TAKE 1 CAPSULE(20 MG) BY MOUTH DAILY 02/24/22   Einar Pheasant, MD  potassium chloride (KLOR-CON M) 10 MEQ tablet Take 2 tablets (20 mEq total) by mouth daily. 04/14/22   Earlie Server, MD    Family History Family History  Problem Relation Age of Onset   Hypertension Mother    Arthritis Mother    Hypertension Father    Stroke Father    Breast cancer Niece     Social History Social History   Tobacco Use   Smoking status: Never   Smokeless tobacco: Never  Substance Use Topics   Alcohol use: No   Drug use: No     Allergies   Patient has no known allergies.   Review of Systems Review of Systems   Physical Exam Triage Vital Signs ED Triage Vitals [05/01/22 1201]  Enc Vitals Group     BP      Pulse      Resp      Temp      Temp src      SpO2      Weight      Height      Head Circumference      Peak Flow      Pain Score 0     Pain Loc      Pain Edu?      Excl. in [place]?    No data found.  Updated Vital Signs There were no vitals taken for this visit.  Visual Acuity Right Eye Distance:   Left Eye Distance:   Bilateral Distance:    Right Eye Near:   Left Eye Near:    Bilateral Near:     Physical Exam Vitals reviewed.  Constitutional:      Appearance: Normal appearance.  HENT:     Nose: Congestion present.     Mouth/Throat:     Mouth: Mucous membranes are moist.     Pharynx: Oropharynx is clear. No posterior oropharyngeal erythema.  Eyes:     Conjunctiva/sclera: Conjunctivae normal.     Pupils: Pupils are equal, round, and reactive to light.  Cardiovascular:     Rate and Rhythm: Normal rate and regular rhythm.     Pulses: Normal pulses.     Heart sounds: Normal heart sounds.  Pulmonary:     Effort: Pulmonary effort is normal.     Breath sounds: Normal breath sounds.   Skin:    General: Skin is warm and dry.  Neurological:     General: No focal deficit present.     Mental Status: She is alert and oriented to person, place, and time.  Psychiatric:  Mood and Affect: Mood normal.        Behavior: Behavior normal.      UC Treatments / Results  Labs (all labs ordered are listed, but only abnormal results are displayed) Labs Reviewed - No data to display  EKG   Radiology No results found.  Procedures Procedures (including critical care time)  Medications Ordered in UC Medications - No data to display  Initial Impression / Assessment and Plan / UC Course  I have reviewed the triage vital signs and the nursing notes.  Pertinent labs & imaging results that were available during my care of the patient were reviewed by me and considered in my medical decision making (see chart for details).   Physical exam is reassuring.  Lungs CTAB.  No reported SOB.  Endorses adequate fluid intake with normal urine production.  No signs/symptoms of dehydration.  Recommend continued OTC medication for symptom control.  Given her mild symptoms and general good health, do not recommend Paxlovid.  Reviewed signs or symptoms which would require emergent treatment in the ED.  Discussed this plan with patient and daughter who are reassured by it and agree.   Final Clinical Impressions(s) / UC Diagnoses   Final diagnoses:  None   Discharge Instructions   None    ED Prescriptions   None    PDMP not reviewed this encounter.   Rose Phi, [place] 05/01/22 1215

## 2022-05-01 NOTE — ED Triage Notes (Signed)
Patient presents to UC for cough, runny nose, and congestion since Tuesday. Taking delsym and coricidin.   Denies fever or SOB.

## 2022-05-01 NOTE — Discharge Instructions (Addendum)
Follow up here or with your primary care provider if your symptoms are worsening or not improving.     

## 2022-05-07 ENCOUNTER — Other Ambulatory Visit: Payer: Self-pay | Admitting: Pharmacist

## 2022-05-07 DIAGNOSIS — C9000 Multiple myeloma not having achieved remission: Secondary | ICD-10-CM

## 2022-05-07 MED ORDER — LENALIDOMIDE 10 MG PO CAPS: 10.0000 mg | ORAL_CAPSULE | Freq: Every day | ORAL | 0 refills | Status: DC

## 2022-05-09 ENCOUNTER — Other Ambulatory Visit: Payer: Self-pay

## 2022-05-12 ENCOUNTER — Inpatient Hospital Stay (HOSPITAL_BASED_OUTPATIENT_CLINIC_OR_DEPARTMENT_OTHER): Payer: Medicare Other | Admitting: Oncology

## 2022-05-12 ENCOUNTER — Other Ambulatory Visit: Payer: Self-pay

## 2022-05-12 ENCOUNTER — Inpatient Hospital Stay: Payer: Medicare Other

## 2022-05-12 ENCOUNTER — Inpatient Hospital Stay: Payer: Medicare Other | Attending: Oncology

## 2022-05-12 ENCOUNTER — Encounter: Payer: Self-pay | Admitting: Oncology

## 2022-05-12 DIAGNOSIS — I10 Essential (primary) hypertension: Secondary | ICD-10-CM | POA: Diagnosis not present

## 2022-05-12 DIAGNOSIS — Z823 Family history of stroke: Secondary | ICD-10-CM | POA: Diagnosis not present

## 2022-05-12 DIAGNOSIS — Z7982 Long term (current) use of aspirin: Secondary | ICD-10-CM | POA: Insufficient documentation

## 2022-05-12 DIAGNOSIS — Z79899 Other long term (current) drug therapy: Secondary | ICD-10-CM | POA: Insufficient documentation

## 2022-05-12 DIAGNOSIS — Z8249 Family history of ischemic heart disease and other diseases of the circulatory system: Secondary | ICD-10-CM | POA: Diagnosis not present

## 2022-05-12 DIAGNOSIS — Z5111 Encounter for antineoplastic chemotherapy: Secondary | ICD-10-CM

## 2022-05-12 DIAGNOSIS — M549 Dorsalgia, unspecified: Secondary | ICD-10-CM | POA: Insufficient documentation

## 2022-05-12 DIAGNOSIS — D649 Anemia, unspecified: Secondary | ICD-10-CM | POA: Diagnosis not present

## 2022-05-12 DIAGNOSIS — Z803 Family history of malignant neoplasm of breast: Secondary | ICD-10-CM | POA: Insufficient documentation

## 2022-05-12 DIAGNOSIS — M899 Disorder of bone, unspecified: Secondary | ICD-10-CM | POA: Diagnosis not present

## 2022-05-12 DIAGNOSIS — E7800 Pure hypercholesterolemia, unspecified: Secondary | ICD-10-CM | POA: Insufficient documentation

## 2022-05-12 DIAGNOSIS — Z5112 Encounter for antineoplastic immunotherapy: Secondary | ICD-10-CM | POA: Diagnosis not present

## 2022-05-12 DIAGNOSIS — C9000 Multiple myeloma not having achieved remission: Secondary | ICD-10-CM | POA: Insufficient documentation

## 2022-05-12 DIAGNOSIS — R5383 Other fatigue: Secondary | ICD-10-CM | POA: Diagnosis not present

## 2022-05-12 DIAGNOSIS — Z79624 Long term (current) use of inhibitors of nucleotide synthesis: Secondary | ICD-10-CM | POA: Insufficient documentation

## 2022-05-12 DIAGNOSIS — Z8261 Family history of arthritis: Secondary | ICD-10-CM | POA: Insufficient documentation

## 2022-05-12 DIAGNOSIS — Z7961 Long term (current) use of immunomodulator: Secondary | ICD-10-CM | POA: Insufficient documentation

## 2022-05-12 LAB — COMPREHENSIVE METABOLIC PANEL
ALT: 22 U/L (ref 0–44)
AST: 26 U/L (ref 15–41)
Albumin: 3.5 g/dL (ref 3.5–5.0)
Alkaline Phosphatase: 57 U/L (ref 38–126)
Anion gap: 6 (ref 5–15)
BUN: 17 mg/dL (ref 8–23)
CO2: 25 mmol/L (ref 22–32)
Calcium: 9 mg/dL (ref 8.9–10.3)
Chloride: 108 mmol/L (ref 98–111)
Creatinine, Ser: 0.99 mg/dL (ref 0.44–1.00)
GFR, Estimated: 56 mL/min — ABNORMAL LOW (ref >60)
Glucose, Bld: 105 mg/dL — ABNORMAL HIGH (ref 70–99)
Potassium: 4.4 mmol/L (ref 3.5–5.1)
Sodium: 139 mmol/L (ref 135–145)
Total Bilirubin: 0.4 mg/dL (ref 0.3–1.2)
Total Protein: 6.7 g/dL (ref 6.5–8.1)

## 2022-05-12 LAB — CBC WITH DIFFERENTIAL/PLATELET
Abs Immature Granulocytes: 0.02 10*3/uL (ref 0.00–0.07)
Basophils Absolute: 0.1 10*3/uL (ref 0.0–0.1)
Basophils Relative: 1 %
Eosinophils Absolute: 0 10*3/uL (ref 0.0–0.5)
Eosinophils Relative: 0 %
HCT: 31.3 % — ABNORMAL LOW (ref 36.0–46.0)
Hemoglobin: 10.3 g/dL — ABNORMAL LOW (ref 12.0–15.0)
Immature Granulocytes: 0 %
Lymphocytes Relative: 19 %
Lymphs Abs: 1 10*3/uL (ref 0.7–4.0)
MCH: 32 pg (ref 26.0–34.0)
MCHC: 32.9 g/dL (ref 30.0–36.0)
MCV: 97.2 fL (ref 80.0–100.0)
Monocytes Absolute: 0.1 10*3/uL (ref 0.1–1.0)
Monocytes Relative: 2 %
Neutro Abs: 3.9 10*3/uL (ref 1.7–7.7)
Neutrophils Relative %: 78 %
Platelets: 289 10*3/uL (ref 150–400)
RBC: 3.22 MIL/uL — ABNORMAL LOW (ref 3.87–5.11)
RDW: 14.8 % (ref 11.5–15.5)
WBC: 5.1 10*3/uL (ref 4.0–10.5)
nRBC: 0 % (ref 0.0–0.2)

## 2022-05-12 MED ORDER — SODIUM CHLORIDE 0.9 % IV SOLN
Freq: Once | INTRAVENOUS | Status: AC
  Filled 2022-05-12: qty 250

## 2022-05-12 MED ORDER — DIPHENHYDRAMINE HCL 25 MG PO CAPS
50.0000 mg | ORAL_CAPSULE | Freq: Once | ORAL | Status: AC
  Administered 2022-05-12: 50 mg via ORAL
  Filled 2022-05-12: qty 2

## 2022-05-12 MED ORDER — ACETAMINOPHEN 325 MG PO TABS
650.0000 mg | ORAL_TABLET | Freq: Once | ORAL | Status: AC
  Administered 2022-05-12: 650 mg via ORAL
  Filled 2022-05-12: qty 2

## 2022-05-12 MED ORDER — ZOLEDRONIC ACID 4 MG/5ML IV CONC
3.3000 mg | Freq: Once | INTRAVENOUS | Status: AC
  Administered 2022-05-12: 3.3 mg via INTRAVENOUS
  Filled 2022-05-12: qty 4.13

## 2022-05-12 MED ORDER — DARATUMUMAB-HYALURONIDASE-FIHJ 1800-30000 MG-UT/15ML ~~LOC~~ SOLN
1800.0000 mg | Freq: Once | SUBCUTANEOUS | Status: AC
  Administered 2022-05-12: 1800 mg via SUBCUTANEOUS
  Filled 2022-05-12: qty 15

## 2022-05-12 NOTE — Progress Notes (Signed)
Pt here for follow up. No new concerns voiced.   

## 2022-05-12 NOTE — Progress Notes (Signed)
Hematology/Oncology Progress note [phone] [phone]         Patient Care Team: Einar Pheasant, MD as PCP - General (Internal Medicine)  ASSESSMENT & PLAN:   Cancer Staging  Multiple myeloma [facility]) Staging form: Plasma Cell Myeloma and Plasma Cell Disorders, AJCC 8th Edition - Clinical stage from 08/22/2021: RISS Stage II (Beta-2-microglobulin (mg/L): 4.1, Albumin (g/dL): 2.8, ISS: Stage II, High-risk cytogenetics: Absent, LDH: Normal) - Signed by Earlie Server, MD on 09/06/2021   Multiple myeloma ([place]) Multiple myeloma, IgA kappa Labs reviewed and discussed with Proceed with D1 Daratumumab.   Revlimid D1-21 Patient takes dexamethasone 20 mg weekly Continue asprin $RemoveBeforeD'81mg'FSNooZgdnroHqL$  daily and Acyclovir $RemoveBefor'400mg'LjwJoNsbqbhW$  BID.   Encounter for antineoplastic chemotherapy Chemotherapy plan as listed as above.  Bone lesion Zometa monthly, hold if calcium is low.  Recommend calcium 1200 mg daily.  No orders of the defined types were placed in this encounter.   Follow up  Lab/MD/Dara/Zometa in 4 weeks  All questions were answered. The patient knows to call the clinic with any problems, questions or concerns.  Earlie Server, MD, PhD [facility] Hematology Oncology 05/12/2022     CHIEF COMPLAINTS/REASON FOR VISIT:  Follow-up for multiple myeloma  HISTORY OF PRESENTING ILLNESS:   Stephanie Delacruz is a  88 y.o.  female with PMH listed below was seen in consultation at the request of  Earlie Server, MD  for evaluation of anemia 07/15/2021, patient had CBC checked at the primary care provider's office.  CBC showed hemoglobin 7.7, hematocrit 23.9, white count 3.6, RDW 16.5.  Platelet count 166. Reviewed previous lab records.  Patient has chronic anemia dating back at least 2016. Her baseline prior to 2018 was about 10.   Hemoglobin was 9.1 in March 2022, and further dropped to 8.1 in October 2022, then November dropped to 7.7. Patient denies being significantly affected, tired, lightheadedness,  shortness of breath.  Appetite is fair.  Denies any black or bloody stool.   Denies any history of hemoglobinopathy  08/26/2021 bone marrow biopsy showed hypercellular marrow with plasma cell neoplasm.  67% plasma cells in the aspirate.  Cytogenetics showed complex hyperplasia, gain of 1 q.  # 08/26/2021 started on Daratumumab+ dexamethasone # 09/06/2021 added Revlimid $RemoveBefore'10mg'khCcQCtbaYiPe$  x 14 days.  #09/27/2021 till present, cycle 2 Revlimid 10 mg for 21 days.  INTERVAL HISTORY Stephanie Delacruz is a 88 y.o. female who has above history reviewed by me today presents for follow up visit for management of multiple myeloma Patient tolerates daratumumab -RD regimen well. She was accompanied by her daughter today.  She denies any nausea vomiting diarrhea.     Review of Systems  Constitutional:  Positive for fatigue. Negative for appetite change, chills and fever.  HENT:   Negative for hearing loss and voice change.   Eyes:  Negative for eye problems.  Respiratory:  Negative for chest tightness and cough.   Cardiovascular:  Negative for chest pain.  Gastrointestinal:  Negative for abdominal distention, abdominal pain and blood in stool.  Endocrine: Negative for hot flashes.  Genitourinary:  Negative for difficulty urinating and frequency.   Musculoskeletal:  Positive for back pain. Negative for arthralgias.  Skin:  Negative for itching and rash.  Neurological:  Negative for extremity weakness.  Hematological:  Negative for adenopathy.  Psychiatric/Behavioral:  Negative for confusion.     MEDICAL HISTORY:  Past Medical History:  Diagnosis Date   Anemia    GERD (gastroesophageal reflux disease)    Hypercholesterolemia  Hypertension    Renal insufficiency     SURGICAL HISTORY: Past Surgical History:  Procedure Laterality Date   TONSILLECTOMY     TUBAL LIGATION      SOCIAL HISTORY: Social History   Socioeconomic History   Marital status: Married    Spouse name: Not on file   Number of children:  Not on file   Years of education: Not on file   Highest education level: Not on file  Occupational History   Not on file  Tobacco Use   Smoking status: Never   Smokeless tobacco: Never  Substance and Sexual Activity   Alcohol use: No   Drug use: No   Sexual activity: Not on file  Other Topics Concern   Not on file  Social History Narrative   Not on file   Social Determinants of Health   Financial Resource Strain: Not on file  Food Insecurity: Not on file  Transportation Needs: Not on file  Physical Activity: Not on file  Stress: Not on file  Social Connections: Not on file  Intimate Partner Violence: Not on file    FAMILY HISTORY: Family History  Problem Relation Age of Onset   Hypertension Mother    Arthritis Mother    Hypertension Father    Stroke Father    Breast cancer Niece     ALLERGIES:  has No Known Allergies.  MEDICATIONS:  Current Outpatient Medications  Medication Sig Dispense Refill   acyclovir (ZOVIRAX) 400 MG tablet Take 1 tablet (400 mg total) by mouth 2 (two) times daily. 60 tablet 11   amLODipine (NORVASC) 2.5 MG tablet Take 5 mg by mouth daily.     ascorbic acid (VITAMIN C) 250 MG tablet daily Take 2 tablets every day by oral route.     aspirin 81 MG EC tablet Take 81 mg by mouth daily.     Calcium Carb-Cholecalciferol (CALCIUM 600+D) 600-20 MG-MCG TABS Take 1 tablet by mouth 3 (three) times daily.     dexamethasone (DECADRON) 4 MG tablet Take 5 tablets (20 mg total) by mouth once a week. 60 tablet 0   donepezil (ARICEPT) 10 MG tablet Take 1 tablet (10 mg total) by mouth daily. 90 tablet 2   fenofibrate (TRICOR) 48 MG tablet Take 1 tablet (48 mg total) by mouth daily. 30 tablet 3   fexofenadine (ALLEGRA) 180 MG tablet Take by mouth.     hydrochlorothiazide (HYDRODIURIL) 25 MG tablet 1/2 tablet q day 45 tablet 1   lenalidomide (REVLIMID) 10 MG capsule Take 1 capsule (10 mg total) by mouth daily. Take for 21 days, then hold for 7 days. Repeat every  28 days. 21 capsule 0   losartan (COZAAR) 100 MG tablet Take 1 tablet (100 mg total) by mouth daily. 90 tablet 2   magic mouthwash (multi-ingredient) oral suspension Swish and swallow 5-96ml four times a day as needed for mouth pain 480 mL 1   memantine (NAMENDA) 5 MG tablet Take 5 mg by mouth 2 (two) times daily.     metoprolol succinate (TOPROL-XL) 25 MG 24 hr tablet Take 2 tablets (50 mg total) by mouth daily. 180 tablet 1   montelukast (SINGULAIR) 10 MG tablet Take 1 tablet (10 mg total) by mouth See admin instructions. Take 1 tablet one day before Daratumumab injection. 60 tablet 0   Multiple Vitamins-Minerals (WOMENS MULTIVITAMIN PO) Take 1 tablet by mouth daily.     omeprazole (PRILOSEC) 20 MG capsule TAKE 1 CAPSULE(20 MG) BY MOUTH DAILY 90  capsule 3   potassium chloride (KLOR-CON M) 10 MEQ tablet Take 2 tablets (20 mEq total) by mouth daily. 60 tablet 1   No current facility-administered medications for this visit.     PHYSICAL EXAMINATION: ECOG PERFORMANCE STATUS: 1 - Symptomatic but completely ambulatory Vitals:   05/12/22 1307  BP: (!) 157/54  Pulse: 63  Resp: 18  Temp: 98.1 F (36.7 C)   Filed Weights   05/12/22 1307  Weight: 140 lb 14.4 oz (63.9 kg)    Physical Exam Constitutional:      General: She is not in acute distress. HENT:     Head: Normocephalic and atraumatic.  Eyes:     General: No scleral icterus. Cardiovascular:     Rate and Rhythm: Normal rate and regular rhythm.     Heart sounds: Normal heart sounds.  Pulmonary:     Effort: Pulmonary effort is normal. No respiratory distress.     Breath sounds: No wheezing.  Abdominal:     General: Bowel sounds are normal. There is no distension.     Palpations: Abdomen is soft.  Musculoskeletal:        General: No deformity. Normal range of motion.     Cervical back: Normal range of motion and neck supple.  Skin:    General: Skin is warm and dry.     Findings: No erythema or rash.  Neurological:      Mental Status: She is alert and oriented to person, place, and time. Mental status is at baseline.     Cranial Nerves: No cranial nerve deficit.     Coordination: Coordination normal.  Psychiatric:        Mood and Affect: Mood normal.     LABORATORY DATA:  I have reviewed the data as listed    Latest Ref Rng & Units 05/12/2022   12:37 PM 04/14/2022    9:08 AM 03/17/2022    8:56 AM  CBC  WBC 4.0 - 10.5 K/uL 5.1  4.2  6.1   Hemoglobin 12.0 - 15.0 g/dL 10.3  9.8  9.1   Hematocrit 36.0 - 46.0 % 31.3  30.7  28.6   Platelets 150 - 400 K/uL 289  217  218       Latest Ref Rng & Units 05/12/2022   12:37 PM 04/14/2022    9:08 AM 03/17/2022    8:56 AM  CMP  Glucose 70 - 99 mg/dL 105  87  80   BUN 8 - 23 mg/dL $Remove'17  18  15   'WrykUZW$ Creatinine 0.44 - 1.00 mg/dL 0.99  1.11  0.90   Sodium 135 - 145 mmol/L 139  139  140   Potassium 3.5 - 5.1 mmol/L 4.4  3.8  3.1   Chloride 98 - 111 mmol/L 108  108  108   CO2 22 - 32 mmol/L $RemoveB'25  25  26   'LRNYjTld$ Calcium 8.9 - 10.3 mg/dL 9.0  9.0  8.5   Total Protein 6.5 - 8.1 g/dL 6.7  6.3  6.2   Total Bilirubin 0.3 - 1.2 mg/dL 0.4  0.4  0.5   Alkaline Phos 38 - 126 U/L 57  43  46   AST 15 - 41 U/L $Remo'26  24  24   'ciZei$ ALT 0 - 44 U/L $Remo'22  16  19      'HIqeg$ Iron/TIBC/Ferritin/ %Sat    Component Value Date/Time   IRON 54 07/24/2021 1212   TIBC 277 07/24/2021 1212   FERRITIN 93 07/24/2021 1212  IRONPCTSAT 20 07/24/2021 1212       RADIOGRAPHIC STUDIES: I have personally reviewed the radiological images as listed and agreed with the findings in the report. DG Bone Density  Result Date: 03/20/2022 EXAM: DUAL X-RAY ABSORPTIOMETRY (DXA) FOR BONE MINERAL DENSITY IMPRESSION: Your patient Juliza Machnik completed a BMD test on 03/20/2022 using the [place] (analysis version: 14.10) manufactured by [employer]. The following summarizes the results of our evaluation. Technologist: LCE PATIENT BIOGRAPHICAL: Name: Latiffany, [place] Patient ID: [id number] Birth Date:  [birth year] Height: 61.0 in. Gender: Female Exam Date: 03/20/2022 Weight: 141.8 lbs. Indications: Advanced Age, Early Menopause, Multiple Myeloma, POSTmenopausal Fractures: Treatments: Aspirin, Calcium, Reclast, Revlimid, Vitamin D ASSESSMENT: The BMD measured at Femur Neck Left is 0.736 g/cm2 with a T-score of -2.2. This patient is considered osteopenic according to [facility]) criteria. L-2 and L-3 were excluded due to degenerative changes. The scan quality is good. Site Region Measured Measured WHO Young Adult BMD Date       Age      Classification T-score AP Spine L1-L4 (L2,L3) 5/20/[age] 86.2 Normal -0.3 1.146 g/cm2 DualFemur Neck Left 03/20/2024 86.2 Osteopenia -2.2 0.736 g/cm2 World Health Organization [facility]) criteria for post-menopausal, Caucasian Women: Normal:       T-score at or above -1 SD Osteopenia:   T-score between -1 and -2.5 SD Osteoporosis: T-score at or below -2.5 SD RECOMMENDATIONS: 1. All patients should optimize calcium and vitamin D intake. 2. Consider FDA-approved medical therapies in postmenopausal women and men aged 86 years and older, based on the following: a. A hip or vertebral (clinical or morphometric) fracture b. T-score < -2.5 at the femoral neck or spine after appropriate evaluation to exclude secondary causes c. Low bone mass (T-score between -1.0 and -2.5 at the femoral neck or spine) and a 10-year probability of a hip fracture > 3% or a 10-year probability of a major osteoporosis-related fracture > 20% based on the [place]-adapted WHO algorithm d. Clinician judgment and/or patient preferences may indicate treatment for people with 10-year fracture probabilities above or below these levels FOLLOW-UP: People with diagnosed cases of osteoporosis or at high risk for fracture should have regular bone mineral density tests. For patients eligible for Medicare, routine testing is allowed once every 2 years. The testing frequency can be increased to one year for patients who  have rapidly progressing disease, those who are receiving or discontinuing medical therapy to restore bone mass, or have additional risk factors. I have reviewed this report, and agree with the above findings. [facility] Your patient JILLENE WEHRENBERG completed a FRAX assessment on 03/20/2022 using the [place] (analysis version: 14.10) manufactured by [employer]. The following summarizes the results of our evaluation. Technologist: LCE PATIENT BIOGRAPHICAL: Name: Donnamae, [place] Patient ID: [id number] Birth Date: 1934-06-01 Height:    61.0 in. Gender:     [age]    [age]       Weight:    141.8 lbs. Ethnicity:  Black                            Exam Date: 03/20/2022 FRAX* RESULTS:  (version: 3.5) 10-year Probability of Fracture1 Major Osteoporotic Fracture2 Hip Fracture 7.6% 2.5% Population: [place] (Black) Risk Factors: None Based on Femur (Left) Neck BMD 1 -The 10-year probability of fracture may be lower than reported if the patient has received  treatment. 2 -Major Osteoporotic Fracture: Clinical Spine, Forearm, Hip or Shoulder *FRAX is a Materials engineer of the [employer] for Metabolic Bone Disease, a [place] (WHO) [facility]. ASSESSMENT: The probability of a major osteoporotic fracture is 7.6% within the next ten years. The probability of a hip fracture is 2.5% within the next ten years. Electronically Signed   By: Ammie Ferrier M.D.   On: 03/20/2022 11:24   VAS [place] LOWER EXTREMITY VENOUS REFLUX  Result Date: 03/10/2022  Lower Venous Reflux Study Patient Name:  KELCIE CURRIE  Date of Exam:   03/05/2022 Medical Rec #: [id number]     Accession #:    [id number] Date of Birth: 06/22/1934      Patient Gender: F Patient Age:   88 years Exam Location:  [facility] Procedure:      VAS [place] LOWER EXTREMITY VENOUS REFLUX Referring Phys: Eulogio Ditch  --------------------------------------------------------------------------------  Indications: Swelling.  Performing Technologist: Almira Coaster RVS  Examination Guidelines: A complete evaluation includes B-mode imaging, spectral Doppler, color Doppler, and power Doppler as needed of all accessible portions of each vessel. Bilateral testing is considered an integral part of a complete examination. Limited examinations for reoccurring indications may be performed as noted. The reflux portion of the exam is performed with the patient in reverse Trendelenburg. Significant venous reflux is defined as >500 ms in the superficial venous system, and >1 second in the deep venous system.  Venous Reflux Times +--------------+---------+------+-----------+------------+--------+ RIGHT         Reflux NoRefluxReflux TimeDiameter cmsComments                         Yes                                  +--------------+---------+------+-----------+------------+--------+ CFV           no                                             +--------------+---------+------+-----------+------------+--------+ FV prox       no                                             +--------------+---------+------+-----------+------------+--------+ FV mid        no                                             +--------------+---------+------+-----------+------------+--------+ FV dist       no                                             +--------------+---------+------+-----------+------------+--------+ Popliteal     no                                             +--------------+---------+------+-----------+------------+--------+  GSV at [facility]    no                            .43              +--------------+---------+------+-----------+------------+--------+ GSV prox thighno                            .43              +--------------+---------+------+-----------+------------+--------+ GSV mid thigh  no                            .38              +--------------+---------+------+-----------+------------+--------+ GSV dist thighno                            .33              +--------------+---------+------+-----------+------------+--------+ GSV at knee   no                            .25              +--------------+---------+------+-----------+------------+--------+ GSV prox calf no                            .23              +--------------+---------+------+-----------+------------+--------+ SSV Pop Fossa no                            .32              +--------------+---------+------+-----------+------------+--------+  +--------------+---------+------+-----------+------------+--------+ LEFT          Reflux NoRefluxReflux TimeDiameter cmsComments                         Yes                                  +--------------+---------+------+-----------+------------+--------+ CFV           no                                             +--------------+---------+------+-----------+------------+--------+ FV prox       no                                             +--------------+---------+------+-----------+------------+--------+ FV mid        no                                             +--------------+---------+------+-----------+------------+--------+ FV dist       no                                             +--------------+---------+------+-----------+------------+--------+  Popliteal               yes    1313 ms                       +--------------+---------+------+-----------+------------+--------+ GSV at SFJ    no                            .47              +--------------+---------+------+-----------+------------+--------+ GSV prox thigh                              .45              +--------------+---------+------+-----------+------------+--------+ GSV mid thigh no                            .36               +--------------+---------+------+-----------+------------+--------+ GSV dist thighno                            .27              +--------------+---------+------+-----------+------------+--------+ GSV at knee   no                            .27              +--------------+---------+------+-----------+------------+--------+ GSV prox calf no                            .23              +--------------+---------+------+-----------+------------+--------+ SSV Pop Fossa no                            .29              +--------------+---------+------+-----------+------------+--------+   Summary: Bilateral: - No evidence of deep vein thrombosis seen in the lower extremities, bilaterally, from the common femoral through the popliteal veins. - No evidence of superficial venous thrombosis in the lower extremities, bilaterally. - No evidence of superficial venous reflux seen in the greater saphenous veins bilaterally. - No evidence of superficial venous reflux seen in the short saphenous veins bilaterally.  Right: - There is no evidence of venous reflux seen in the right lower extremity.  Left: - Venous reflux is noted in the left popliteal vein.  *See table(s) above for measurements and observations. Electronically signed by Leotis Pain MD on 03/10/2022 at 2:03:11 PM.    Final

## 2022-05-12 NOTE — Patient Instructions (Signed)
[facility] AT [place]  Discharge Instructions: Thank you for choosing [facility] to provide your oncology and hematology care.  If you have a lab appointment with the [facility], please go directly to the [facility] and check in at the registration area.  Wear comfortable clothing and clothing appropriate for easy access to any Portacath or PICC line.   We strive to give you quality time with your provider. You may need to reschedule your appointment if you arrive late (15 or more minutes).  Arriving late affects you and other patients whose appointments are after yours.  Also, if you miss three or more appointments without notifying the office, you may be dismissed from the clinic at the provider's discretion.      For prescription refill requests, have your pharmacy contact our office and allow 72 hours for refills to be completed.    Today you received the following chemotherapy and/or immunotherapy agents Darzalex & Zometa      To help prevent nausea and vomiting after your treatment, we encourage you to take your nausea medication as directed.  BELOW ARE SYMPTOMS THAT SHOULD BE REPORTED IMMEDIATELY: *FEVER GREATER THAN 100.4 F (38 C) OR HIGHER *CHILLS OR SWEATING *NAUSEA AND VOMITING THAT IS NOT CONTROLLED WITH YOUR NAUSEA MEDICATION *UNUSUAL SHORTNESS OF BREATH *UNUSUAL BRUISING OR BLEEDING *URINARY PROBLEMS (pain or burning when urinating, or frequent urination) *BOWEL PROBLEMS (unusual diarrhea, constipation, pain near the anus) TENDERNESS IN MOUTH AND THROAT WITH OR WITHOUT PRESENCE OF ULCERS (sore throat, sores in mouth, or a toothache) UNUSUAL RASH, SWELLING OR PAIN  UNUSUAL VAGINAL DISCHARGE OR ITCHING   Items with * indicate a potential emergency and should be followed up as soon as possible or go to the Emergency Department if any problems should occur.  Please show the CHEMOTHERAPY ALERT CARD or IMMUNOTHERAPY ALERT CARD at  check-in to the Emergency Department and triage nurse.  Should you have questions after your visit or need to cancel or reschedule your appointment, please contact [facility] [place] [facility] [place]  [phone] and follow the prompts.  Office hours are 8:00 a.m. to 4:30 p.m. Monday - Friday. Please note that voicemails left after 4:00 p.m. may not be returned until the following business day.  We are closed weekends and major holidays. You have access to a nurse at all times for urgent questions. Please call the main number to the clinic [phone] and follow the prompts.  For any non-urgent questions, you may also contact your provider using MyChart. We now offer e-Visits for anyone 80 and older to request care online for non-urgent symptoms. For details visit [email or web address].   Also download the MyChart app! Go to the app store, search "MyChart", open the app, select [facility], and log in with your MyChart username and password.  Masks are optional in the cancer centers. If you would like for your care team to wear a mask while they are taking care of you, please let them know. For doctor visits, patients may have with them one support person who is at least 86 years old. At this time, visitors are not allowed in the infusion area.

## 2022-05-12 NOTE — Assessment & Plan Note (Signed)
Multiple myeloma, IgA kappa Labs reviewed and discussed with Proceed with D1 Daratumumab.   Revlimid D1-21 Patient takes dexamethasone 20 mg weekly Continue asprin 81mg daily and Acyclovir 400mg BID.  

## 2022-05-12 NOTE — Assessment & Plan Note (Signed)
Chemotherapy plan as listed as above. 

## 2022-05-12 NOTE — Assessment & Plan Note (Signed)
Zometa monthly, hold if calcium is low.  Recommend calcium 1200 mg daily. 

## 2022-05-13 ENCOUNTER — Other Ambulatory Visit: Payer: Self-pay

## 2022-05-13 LAB — KAPPA/LAMBDA LIGHT CHAINS
Kappa free light chain: 20.9 mg/L — ABNORMAL HIGH (ref 3.3–19.4)
Kappa, lambda light chain ratio: 2.15 — ABNORMAL HIGH (ref 0.26–1.65)
Lambda free light chains: 9.7 mg/L (ref 5.7–26.3)

## 2022-05-14 ENCOUNTER — Other Ambulatory Visit: Payer: Self-pay

## 2022-05-15 ENCOUNTER — Encounter: Payer: Self-pay | Admitting: Internal Medicine

## 2022-05-15 LAB — MULTIPLE MYELOMA PANEL, SERUM
Albumin SerPl Elph-Mcnc: 3.3 g/dL (ref 2.9–4.4)
Albumin/Glob SerPl: 1.2 (ref 0.7–1.7)
Alpha 1: 0.3 g/dL (ref 0.0–0.4)
Alpha2 Glob SerPl Elph-Mcnc: 0.8 g/dL (ref 0.4–1.0)
B-Globulin SerPl Elph-Mcnc: 1.3 g/dL (ref 0.7–1.3)
Gamma Glob SerPl Elph-Mcnc: 0.4 g/dL (ref 0.4–1.8)
Globulin, Total: 2.8 g/dL (ref 2.2–3.9)
IgA: 370 mg/dL (ref 64–422)
IgG (Immunoglobin G), Serum: 497 mg/dL — ABNORMAL LOW (ref 586–1602)
IgM (Immunoglobulin M), Srm: 12 mg/dL — ABNORMAL LOW (ref 26–217)
M Protein SerPl Elph-Mcnc: 0.1 g/dL — ABNORMAL HIGH
Total Protein ELP: 6.1 g/dL (ref 6.0–8.5)

## 2022-05-25 ENCOUNTER — Other Ambulatory Visit: Payer: Self-pay | Admitting: Internal Medicine

## 2022-05-26 ENCOUNTER — Encounter (INDEPENDENT_AMBULATORY_CARE_PROVIDER_SITE_OTHER): Payer: Self-pay

## 2022-06-04 ENCOUNTER — Encounter: Payer: Self-pay | Admitting: Oncology

## 2022-06-04 ENCOUNTER — Other Ambulatory Visit: Payer: Self-pay

## 2022-06-04 DIAGNOSIS — C9000 Multiple myeloma not having achieved remission: Secondary | ICD-10-CM

## 2022-06-04 MED ORDER — LENALIDOMIDE 10 MG PO CAPS: 10.0000 mg | ORAL_CAPSULE | Freq: Every day | ORAL | 0 refills | Status: DC

## 2022-06-05 ENCOUNTER — Ambulatory Visit (INDEPENDENT_AMBULATORY_CARE_PROVIDER_SITE_OTHER): Payer: Medicare Other | Admitting: Nurse Practitioner

## 2022-06-08 ENCOUNTER — Other Ambulatory Visit: Payer: Self-pay

## 2022-06-09 ENCOUNTER — Other Ambulatory Visit: Payer: Medicare Other

## 2022-06-09 ENCOUNTER — Ambulatory Visit: Payer: Medicare Other

## 2022-06-09 ENCOUNTER — Ambulatory Visit: Payer: Medicare Other | Admitting: Oncology

## 2022-06-10 ENCOUNTER — Inpatient Hospital Stay: Payer: Medicare Other

## 2022-06-10 ENCOUNTER — Inpatient Hospital Stay (HOSPITAL_BASED_OUTPATIENT_CLINIC_OR_DEPARTMENT_OTHER): Payer: Medicare Other | Admitting: Oncology

## 2022-06-10 ENCOUNTER — Inpatient Hospital Stay: Payer: Medicare Other | Attending: Oncology

## 2022-06-10 ENCOUNTER — Encounter: Payer: Self-pay | Admitting: Oncology

## 2022-06-10 VITALS — BP 143/57 | Temp 98.7°F | Resp 18 | Wt 139.3 lb

## 2022-06-10 VITALS — BP 132/58 | HR 59 | Temp 98.5°F | Resp 18

## 2022-06-10 DIAGNOSIS — D649 Anemia, unspecified: Secondary | ICD-10-CM

## 2022-06-10 DIAGNOSIS — I129 Hypertensive chronic kidney disease with stage 1 through stage 4 chronic kidney disease, or unspecified chronic kidney disease: Secondary | ICD-10-CM | POA: Insufficient documentation

## 2022-06-10 DIAGNOSIS — Z8249 Family history of ischemic heart disease and other diseases of the circulatory system: Secondary | ICD-10-CM | POA: Diagnosis not present

## 2022-06-10 DIAGNOSIS — Z5112 Encounter for antineoplastic immunotherapy: Secondary | ICD-10-CM | POA: Diagnosis not present

## 2022-06-10 DIAGNOSIS — Z7982 Long term (current) use of aspirin: Secondary | ICD-10-CM | POA: Diagnosis not present

## 2022-06-10 DIAGNOSIS — R5383 Other fatigue: Secondary | ICD-10-CM | POA: Diagnosis not present

## 2022-06-10 DIAGNOSIS — M549 Dorsalgia, unspecified: Secondary | ICD-10-CM | POA: Diagnosis not present

## 2022-06-10 DIAGNOSIS — C9000 Multiple myeloma not having achieved remission: Secondary | ICD-10-CM

## 2022-06-10 DIAGNOSIS — Z79899 Other long term (current) drug therapy: Secondary | ICD-10-CM | POA: Diagnosis not present

## 2022-06-10 DIAGNOSIS — Z803 Family history of malignant neoplasm of breast: Secondary | ICD-10-CM | POA: Insufficient documentation

## 2022-06-10 DIAGNOSIS — Z79624 Long term (current) use of inhibitors of nucleotide synthesis: Secondary | ICD-10-CM | POA: Insufficient documentation

## 2022-06-10 DIAGNOSIS — E7800 Pure hypercholesterolemia, unspecified: Secondary | ICD-10-CM | POA: Diagnosis not present

## 2022-06-10 DIAGNOSIS — Z7961 Long term (current) use of immunomodulator: Secondary | ICD-10-CM | POA: Diagnosis not present

## 2022-06-10 DIAGNOSIS — N1831 Chronic kidney disease, stage 3a: Secondary | ICD-10-CM

## 2022-06-10 DIAGNOSIS — Z5111 Encounter for antineoplastic chemotherapy: Secondary | ICD-10-CM

## 2022-06-10 DIAGNOSIS — Z823 Family history of stroke: Secondary | ICD-10-CM | POA: Diagnosis not present

## 2022-06-10 DIAGNOSIS — Z8261 Family history of arthritis: Secondary | ICD-10-CM | POA: Diagnosis not present

## 2022-06-10 DIAGNOSIS — M899 Disorder of bone, unspecified: Secondary | ICD-10-CM

## 2022-06-10 LAB — CBC WITH DIFFERENTIAL/PLATELET
Abs Immature Granulocytes: 0.02 10*3/uL (ref 0.00–0.07)
Basophils Absolute: 0.1 10*3/uL (ref 0.0–0.1)
Basophils Relative: 3 %
Eosinophils Absolute: 0.1 10*3/uL (ref 0.0–0.5)
Eosinophils Relative: 3 %
HCT: 31.2 % — ABNORMAL LOW (ref 36.0–46.0)
Hemoglobin: 10 g/dL — ABNORMAL LOW (ref 12.0–15.0)
Immature Granulocytes: 0 %
Lymphocytes Relative: 42 %
Lymphs Abs: 1.9 10*3/uL (ref 0.7–4.0)
MCH: 31.4 pg (ref 26.0–34.0)
MCHC: 32.1 g/dL (ref 30.0–36.0)
MCV: 98.1 fL (ref 80.0–100.0)
Monocytes Absolute: 0.4 10*3/uL (ref 0.1–1.0)
Monocytes Relative: 9 %
Neutro Abs: 1.9 10*3/uL (ref 1.7–7.7)
Neutrophils Relative %: 43 %
Platelets: 235 10*3/uL (ref 150–400)
RBC: 3.18 MIL/uL — ABNORMAL LOW (ref 3.87–5.11)
RDW: 14.9 % (ref 11.5–15.5)
WBC: 4.5 10*3/uL (ref 4.0–10.5)
nRBC: 0 % (ref 0.0–0.2)

## 2022-06-10 LAB — COMPREHENSIVE METABOLIC PANEL
ALT: 18 U/L (ref 0–44)
AST: 25 U/L (ref 15–41)
Albumin: 3.3 g/dL — ABNORMAL LOW (ref 3.5–5.0)
Alkaline Phosphatase: 41 U/L (ref 38–126)
Anion gap: 6 (ref 5–15)
BUN: 18 mg/dL (ref 8–23)
CO2: 25 mmol/L (ref 22–32)
Calcium: 8.9 mg/dL (ref 8.9–10.3)
Chloride: 109 mmol/L (ref 98–111)
Creatinine, Ser: 1.03 mg/dL — ABNORMAL HIGH (ref 0.44–1.00)
GFR, Estimated: 53 mL/min — ABNORMAL LOW (ref >60)
Glucose, Bld: 91 mg/dL (ref 70–99)
Potassium: 3.8 mmol/L (ref 3.5–5.1)
Sodium: 140 mmol/L (ref 135–145)
Total Bilirubin: 0.4 mg/dL (ref 0.3–1.2)
Total Protein: 6.1 g/dL — ABNORMAL LOW (ref 6.5–8.1)

## 2022-06-10 MED ORDER — SODIUM CHLORIDE 0.9 % IV SOLN
Freq: Once | INTRAVENOUS | Status: AC
  Filled 2022-06-10: qty 250

## 2022-06-10 MED ORDER — DIPHENHYDRAMINE HCL 25 MG PO CAPS
50.0000 mg | ORAL_CAPSULE | Freq: Once | ORAL | Status: AC
  Administered 2022-06-10: 50 mg via ORAL
  Filled 2022-06-10: qty 2

## 2022-06-10 MED ORDER — DARATUMUMAB-HYALURONIDASE-FIHJ 1800-30000 MG-UT/15ML ~~LOC~~ SOLN
1800.0000 mg | Freq: Once | SUBCUTANEOUS | Status: AC
  Administered 2022-06-10: 1800 mg via SUBCUTANEOUS
  Filled 2022-06-10: qty 15

## 2022-06-10 MED ORDER — ACETAMINOPHEN 325 MG PO TABS
650.0000 mg | ORAL_TABLET | Freq: Once | ORAL | Status: AC
  Administered 2022-06-10: 650 mg via ORAL
  Filled 2022-06-10: qty 2

## 2022-06-10 MED ORDER — ZOLEDRONIC ACID 4 MG/5ML IV CONC
3.3000 mg | Freq: Once | INTRAVENOUS | Status: AC
  Administered 2022-06-10: 3.3 mg via INTRAVENOUS
  Filled 2022-06-10: qty 4.13

## 2022-06-10 NOTE — Progress Notes (Signed)
Hematology/Oncology Progress note [phone] [phone]         Patient Care Team: Stephanie Pheasant, MD as PCP - General (Internal Medicine)  ASSESSMENT & PLAN:   Cancer Staging  Multiple myeloma Stephanie Delacruz) Staging form: Plasma Cell Myeloma and Plasma Cell Disorders, AJCC 8th Edition - Clinical stage from 08/22/2021: RISS Stage II (Beta-2-microglobulin (mg/L): 4.1, Albumin (g/dL): 2.8, ISS: Stage II, High-risk cytogenetics: Absent, LDH: Normal) - Signed by Stephanie Server, MD on 09/06/2021   Multiple myeloma (Stephanie Delacruz) Multiple myeloma, IgA kappa, complex hyperploid gain of 1q Labs reviewed and discussed with Proceed with Daratumumab.   Revlimid D1-21 Patient takes dexamethasone 20 mg weekly Continue asprin 33m daily and Acyclovir 4076mBID.  Plan to repeat BM biopsy in Jan 2024  Bone lesion Zometa monthly, hold if calcium is low.  Recommend calcium 1200 mg daily.  Anemia Continue monitor.   Stage 3a chronic kidney disease (HCTrentonrecommend patient to avoid nephrotoxin.  Encourage oral hydration.  Encounter for antineoplastic chemotherapy Chemotherapy plan as listed as above.  No orders of the defined types were placed in this encounter.   Follow up  Lab/MD/Dara/Zometa in 4 weeks  All questions were answered. The patient knows to call the clinic with any problems, questions or concerns.  Stephanie ServerMD, PhD [facility] 06/10/2022     CHIEF COMPLAINTS/REASON FOR VISIT:  Follow-up for multiple myeloma  HISTORY OF PRESENTING ILLNESS:   Stephanie Delacruz a  [age].o.  female with PMH listed below was seen in consultation at the request of  YuEarlie ServerMD  for evaluation of anemia 07/15/2021, patient had CBC checked at the primary care provider's office.  CBC showed hemoglobin 7.7, hematocrit 23.9, white count 3.6, RDW 16.5.  Platelet count 166. Reviewed previous lab records.  Patient has chronic anemia dating back at least 2016. Her baseline prior  to 2018 was about 10.   Hemoglobin was 9.1 in March 2022, and further dropped to 8.1 in October 2022, then November dropped to 7.7. Patient denies being significantly affected, tired, lightheadedness, shortness of breath.  Appetite is fair.  Denies any black or bloody stool.   Denies any history of hemoglobinopathy  08/26/2021 bone marrow biopsy showed hypercellular marrow with plasma cell neoplasm.  67% plasma cells in the aspirate.  Cytogenetics showed complex hyperplasia, gain of 1 q.  # 08/26/2021 started on Daratumumab+ dexamethasone # 09/06/2021 added Revlimid 1057m 14 days.  #09/27/2021 till present, cycle 2 Revlimid 10 mg for 21 days.  INTERVAL HISTORY Stephanie Delacruz a 88 35o. female who has above history reviewed by me today presents for follow up visit for management of multiple myeloma Patient tolerates daratumumab -RD regimen well. She was accompanied by her daughter today.  She denies any nausea vomiting diarrhea.   No new complaints.    Review of Systems  Constitutional:  Positive for fatigue. Negative for appetite change, chills and fever.  HENT:   Negative for hearing loss and voice change.   Eyes:  Negative for eye problems.  Respiratory:  Negative for chest tightness and cough.   Cardiovascular:  Negative for chest pain.  Gastrointestinal:  Negative for abdominal distention, abdominal pain and blood in stool.  Endocrine: Negative for hot flashes.  Genitourinary:  Negative for difficulty urinating and frequency.   Musculoskeletal:  Positive for back pain. Negative for arthralgias.  Skin:  Negative for itching and rash.  Neurological:  Negative for extremity weakness.  Hematological:  Negative for  adenopathy.  Psychiatric/Behavioral:  Negative for confusion.     MEDICAL HISTORY:  Past Medical History:  Diagnosis Date   Anemia    GERD (gastroesophageal reflux disease)    Hypercholesterolemia    Hypertension    Renal insufficiency     SURGICAL HISTORY: Past  Surgical History:  Procedure Laterality Date   TONSILLECTOMY     TUBAL LIGATION      SOCIAL HISTORY: Social History   Socioeconomic History   Marital status: Married    Spouse name: Not on file   Number of children: Not on file   Years of education: Not on file   Highest education level: Not on file  Occupational History   Not on file  Tobacco Use   Smoking status: Never   Smokeless tobacco: Never  Substance and Sexual Activity   Alcohol use: No   Drug use: No   Sexual activity: Not on file  Other Topics Concern   Not on file  Social History Narrative   Not on file   Social Determinants of Health   Financial Resource Strain: Not on file  Food Insecurity: Not on file  Transportation Needs: Not on file  Physical Activity: Not on file  Stress: Not on file  Social Connections: Not on file  Intimate Partner Violence: Not on file    FAMILY HISTORY: Family History  Problem Relation Age of Onset   Hypertension Mother    Arthritis Mother    Hypertension Father    Stroke Father    Breast cancer Niece     ALLERGIES:  has No Known Allergies.  MEDICATIONS:  Current Outpatient Medications  Medication Sig Dispense Refill   acyclovir (ZOVIRAX) 400 MG tablet Take 1 tablet (400 mg total) by mouth 2 (two) times daily. 60 tablet 11   amLODipine (NORVASC) 2.5 MG tablet Take 5 mg by mouth daily.     ascorbic acid (VITAMIN C) 250 MG tablet daily Take 2 tablets every day by oral route.     aspirin 81 MG EC tablet Take 81 mg by mouth daily.     Calcium Carb-Cholecalciferol (CALCIUM 600+D) 600-20 MG-MCG TABS Take 1 tablet by mouth 3 (three) times daily.     dexamethasone (DECADRON) 4 MG tablet Take 5 tablets (20 mg total) by mouth once a week. 60 tablet 0   donepezil (ARICEPT) 10 MG tablet Take 1 tablet (10 mg total) by mouth daily. 90 tablet 2   fenofibrate (TRICOR) 48 MG tablet TAKE 1 TABLET(48 MG) BY MOUTH DAILY 30 tablet 3   fexofenadine (ALLEGRA) 180 MG tablet Take by mouth.      hydrochlorothiazide (HYDRODIURIL) 25 MG tablet 1/2 tablet q day 45 tablet 1   lenalidomide (REVLIMID) 10 MG capsule Take 1 capsule (10 mg total) by mouth daily. Take for 21 days, then hold for 7 days. Repeat every 28 days. 21 capsule 0   losartan (COZAAR) 100 MG tablet Take 1 tablet (100 mg total) by mouth daily. 90 tablet 2   magic mouthwash (multi-ingredient) oral suspension Swish and swallow 5-50m four times a day as needed for mouth pain 480 mL 1   memantine (NAMENDA) 5 MG tablet Take 5 mg by mouth 2 (two) times daily.     metoprolol succinate (TOPROL-XL) 25 MG 24 hr tablet Take 2 tablets (50 mg total) by mouth daily. 180 tablet 1   montelukast (SINGULAIR) 10 MG tablet Take 1 tablet (10 mg total) by mouth See admin instructions. Take 1 tablet one day before  Daratumumab injection. 60 tablet 0   Multiple Vitamins-Minerals (WOMENS MULTIVITAMIN PO) Take 1 tablet by mouth daily.     omeprazole (PRILOSEC) 20 MG capsule TAKE 1 CAPSULE(20 MG) BY MOUTH DAILY 90 capsule 3   potassium chloride (KLOR-CON M) 10 MEQ tablet Take 2 tablets (20 mEq total) by mouth daily. 60 tablet 1   No current facility-administered medications for this visit.     PHYSICAL EXAMINATION: ECOG PERFORMANCE STATUS: 1 - Symptomatic but completely ambulatory Vitals:   06/10/22 0907  BP: (!) 143/57  Resp: 18  Temp: 98.7 F (37.1 C)   Filed Weights   06/10/22 0907  Weight: 139 lb 4.8 oz (63.2 kg)    Physical Exam Constitutional:      General: She is not in acute distress. HENT:     Head: Normocephalic and atraumatic.  Eyes:     General: No scleral icterus. Cardiovascular:     Rate and Rhythm: Normal rate and regular rhythm.     Heart sounds: Normal heart sounds.  Pulmonary:     Effort: Pulmonary effort is normal. No respiratory distress.     Breath sounds: No wheezing.  Abdominal:     General: Bowel sounds are normal. There is no distension.     Palpations: Abdomen is soft.  Musculoskeletal:         General: No deformity. Normal range of motion.     Cervical back: Normal range of motion and neck supple.  Skin:    General: Skin is warm and dry.     Findings: No erythema or rash.  Neurological:     Mental Status: She is alert and oriented to person, place, and time. Mental status is at baseline.     Cranial Nerves: No cranial nerve deficit.     Coordination: Coordination normal.  Psychiatric:        Mood and Affect: Mood normal.     LABORATORY DATA:  I have reviewed the data as listed    Latest Ref Rng & Units 06/10/2022    8:48 AM 05/12/2022   12:37 PM 04/14/2022    9:08 AM  CBC  WBC 4.0 - 10.5 K/uL 4.5  5.1  4.2   Hemoglobin 12.0 - 15.0 g/dL 10.0  10.3  9.8   Hematocrit 36.0 - 46.0 % 31.2  31.3  30.7   Platelets 150 - 400 K/uL 235  289  217       Latest Ref Rng & Units 06/10/2022    8:48 AM 05/12/2022   12:37 PM 04/14/2022    9:08 AM  CMP  Glucose 70 - 99 mg/dL 91  105  87   BUN 8 - 23 mg/dL _0 Creatinine 0.44 - 1.00 mg/dL 1.03  0.99  1.11   Sodium 135 - 145 mmol/L 140  139  139   Potassium 3.5 - 5.1 mmol/L 3.8  4.4  3.8   Chloride 98 - 111 mmol/L 109  108  108   CO2 22 - 32 mmol/L _1 Calcium 8.9 - 10.3 mg/dL 8.9  9.0  9.0   Total Protein 6.5 - 8.1 g/dL 6.1  6.7  6.3   Total Bilirubin 0.3 - 1.2 mg/dL 0.4  0.4  0.4   Alkaline Phos 38 - 126 U/L 41  57  43   AST 15 - 41 U/L _2 ALT 0 - 44 U/L 18  22  16  Iron/TIBC/Ferritin/ %Sat    Component Value Date/Time   IRON 54 07/24/2021 1212   TIBC 277 07/24/2021 1212   FERRITIN 93 07/24/2021 1212   IRONPCTSAT 20 07/24/2021 1212       RADIOGRAPHIC STUDIES: I have personally reviewed the radiological images as listed and agreed with the findings in the report. DG Bone Density  Result Date: 03/20/2022 EXAM: DUAL X-RAY ABSORPTIOMETRY (DXA) FOR BONE MINERAL DENSITY IMPRESSION: Your patient Indra Wolters completed a BMD test on 03/20/2022 using the [place]  (analysis version: 14.10) manufactured by [employer]. The following summarizes the results of our evaluation. Technologist: LCE PATIENT BIOGRAPHICAL: Name: Jonika, Critz Patient ID: [id number] Birth Date: 05/01/1934 Height: 61.0 in. Gender: Female Exam Date: 03/20/2022 Weight: 141.8 lbs. Indications: Advanced Age, Early Menopause, Multiple Myeloma, POSTmenopausal Fractures: Treatments: Aspirin, Calcium, Reclast, Revlimid, Vitamin D ASSESSMENT: The BMD measured at Femur Neck Left is 0.736 g/cm2 with a T-score of -2.2. This patient is considered osteopenic according to [facility]) criteria. L-2 and L-3 were excluded due to degenerative changes. The scan quality is good. Site Region Measured Measured WHO Young Adult BMD Date       Age      Classification T-score AP Spine L1-L4 (L2,L3) 5/20/[age] 86.2 Normal -0.3 1.146 g/cm2 DualFemur Neck Left 03/20/2022 86.2 Osteopenia -2.2 0.736 g/cm2 World Health Organization [facility]) criteria for post-menopausal, Caucasian Women: Normal:       T-score at or above -1 SD Osteopenia:   T-score between -1 and -2.5 SD Osteoporosis: T-score at or below -2.5 SD RECOMMENDATIONS: 1. All patients should optimize calcium and vitamin D intake. 2. Consider FDA-approved medical therapies in postmenopausal women and men aged 17 years and older, based on the following: a. A hip or vertebral (clinical or morphometric) fracture b. T-score < -2.5 at the femoral neck or spine after appropriate evaluation to exclude secondary causes c. Low bone mass (T-score between -1.0 and -2.5 at the femoral neck or spine) and a 10-year probability of a hip fracture > 3% or a 10-year probability of a major osteoporosis-related fracture > 20% based on the [place]-adapted WHO algorithm d. Clinician judgment and/or patient preferences may indicate treatment for people with 10-year fracture probabilities above or below these levels FOLLOW-UP: People with diagnosed cases of osteoporosis or at high risk for  fracture should have regular bone mineral density tests. For patients eligible for Medicare, routine testing is allowed once every 2 years. The testing frequency can be increased to one year for patients who have rapidly progressing disease, those who are receiving or discontinuing medical therapy to restore bone mass, or have additional risk factors. I have reviewed this report, and agree with the above findings. [facility] Your patient RETHEL SEBEK completed a FRAX assessment on 03/20/2022 using the [place] (analysis version: 14.10) manufactured by [employer]. The following summarizes the results of our evaluation. Technologist: LCE PATIENT BIOGRAPHICAL: Name: Ingri, Diemer Patient ID: [id number] Birth Date: September 09, 1933 Height:    61.0 in. Gender:     [age]    [age]       Weight:    141.8 lbs. Ethnicity:  Black                            Exam Date: 03/20/2022 FRAX* RESULTS:  (version: 3.5) 10-year Probability of Fracture1 Major Osteoporotic Fracture2 Hip Fracture 7.6% 2.5%  Population: [place] (Black) Risk Factors: None Based on Femur (Left) Neck BMD 1 -The 10-year probability of fracture may be lower than reported if the patient has received treatment. 2 -Major Osteoporotic Fracture: Clinical Spine, Forearm, Hip or Shoulder *FRAX is a Materials engineer of the [employer] for Metabolic Bone Disease, a [place] (WHO) [facility]. ASSESSMENT: The probability of a major osteoporotic fracture is 7.6% within the next ten years. The probability of a hip fracture is 2.5% within the next ten years. Electronically Signed   By: Ammie Ferrier M.D.   On: 03/20/2022 11:24

## 2022-06-10 NOTE — Assessment & Plan Note (Signed)
Chemotherapy plan as listed as above. 

## 2022-06-10 NOTE — Assessment & Plan Note (Signed)
recommend patient to avoid nephrotoxin.  Encourage oral hydration. 

## 2022-06-10 NOTE — Assessment & Plan Note (Signed)
Continue monitor

## 2022-06-10 NOTE — Patient Instructions (Signed)
[facility]  Discharge Instructions: Thank you for choosing [facility] to provide your oncology and hematology care.  If you have a lab appointment with the Cancer Center, please go directly to the Cancer Center and check in at the registration area.  Wear comfortable clothing and clothing appropriate for easy access to any Portacath or PICC line.   We strive to give you quality time with your provider. You may need to reschedule your appointment if you arrive late (15 or more minutes).  Arriving late affects you and other patients whose appointments are after yours.  Also, if you miss three or more appointments without notifying the office, you may be dismissed from the clinic at the provider's discretion.      For prescription refill requests, have your pharmacy contact our office and allow 72 hours for refills to be completed.    Today you received the following chemotherapy and/or immunotherapy agents: Darzalex Faspro      To help prevent nausea and vomiting after your treatment, we encourage you to take your nausea medication as directed.  BELOW ARE SYMPTOMS THAT SHOULD BE REPORTED IMMEDIATELY: *FEVER GREATER THAN 100.4 F (38 C) OR HIGHER *CHILLS OR SWEATING *NAUSEA AND VOMITING THAT IS NOT CONTROLLED WITH YOUR NAUSEA MEDICATION *UNUSUAL SHORTNESS OF BREATH *UNUSUAL BRUISING OR BLEEDING *URINARY PROBLEMS (pain or burning when urinating, or frequent urination) *BOWEL PROBLEMS (unusual diarrhea, constipation, pain near the anus) TENDERNESS IN MOUTH AND THROAT WITH OR WITHOUT PRESENCE OF ULCERS (sore throat, sores in mouth, or a toothache) UNUSUAL RASH, SWELLING OR PAIN  UNUSUAL VAGINAL DISCHARGE OR ITCHING   Items with * indicate a potential emergency and should be followed up as soon as possible or go to the Emergency Department if any problems should occur.  Please show the CHEMOTHERAPY ALERT CARD or IMMUNOTHERAPY ALERT CARD at  check-in to the Emergency Department and triage nurse.  Should you have questions after your visit or need to cancel or reschedule your appointment, please contact [facility]  [phone] and follow the prompts.  Office hours are 8:00 a.m. to 4:30 p.m. Monday - Friday. Please note that voicemails left after 4:00 p.m. may not be returned until the following business day.  We are closed weekends and major holidays. You have access to a nurse at all times for urgent questions. Please call the main number to the clinic [phone] and follow the prompts.  For any non-urgent questions, you may also contact your provider using MyChart. We now offer e-Visits for anyone 18 and older to request care online for non-urgent symptoms. For details visit [email or web address].   Also download the MyChart app! Go to the app store, search "MyChart", open the app, select [facility], and log in with your MyChart username and password.  Masks are optional in the cancer centers. If you would like for your care team to wear a mask while they are taking care of you, please let them know. For doctor visits, patients may have with them one support person who is at least 86 years old. At this time, visitors are not allowed in the infusion area.   

## 2022-06-10 NOTE — Progress Notes (Signed)
Pt here for follow up. No new concerns voiced.   

## 2022-06-10 NOTE — Assessment & Plan Note (Addendum)
Multiple myeloma, IgA kappa, complex hyperploid gain of 1q Labs reviewed and discussed with Proceed with Daratumumab.   Revlimid D1-21 Patient takes dexamethasone 20 mg weekly Continue asprin 56m daily and Acyclovir 4037mBID.  Plan to repeat BM biopsy in Jan 2024

## 2022-06-10 NOTE — Progress Notes (Signed)
Pt reports she took steroid prior to coming to clinic. Pre-med orders are 650 mg PO Tylenol and 50 mg PO Benadryl. Per [facility] no need to wait full hour post Tylenol and Benadryl prior to Darzalex.

## 2022-06-10 NOTE — Assessment & Plan Note (Signed)
Zometa monthly, hold if calcium is low.  Recommend calcium 1200 mg daily.

## 2022-06-11 LAB — KAPPA/LAMBDA LIGHT CHAINS
Kappa free light chain: 14 mg/L (ref 3.3–19.4)
Kappa, lambda light chain ratio: 1.87 — ABNORMAL HIGH (ref 0.26–1.65)
Lambda free light chains: 7.5 mg/L (ref 5.7–26.3)

## 2022-06-13 ENCOUNTER — Ambulatory Visit (INDEPENDENT_AMBULATORY_CARE_PROVIDER_SITE_OTHER): Payer: Medicare Other | Admitting: Vascular Surgery

## 2022-06-13 ENCOUNTER — Encounter (INDEPENDENT_AMBULATORY_CARE_PROVIDER_SITE_OTHER): Payer: Self-pay | Admitting: Vascular Surgery

## 2022-06-13 VITALS — BP 145/65 | HR 61 | Resp 18 | Ht 64.0 in | Wt 139.4 lb

## 2022-06-13 DIAGNOSIS — N1831 Chronic kidney disease, stage 3a: Secondary | ICD-10-CM | POA: Diagnosis not present

## 2022-06-13 DIAGNOSIS — I890 Lymphedema, not elsewhere classified: Secondary | ICD-10-CM | POA: Diagnosis not present

## 2022-06-13 DIAGNOSIS — D649 Anemia, unspecified: Secondary | ICD-10-CM | POA: Diagnosis not present

## 2022-06-13 LAB — MULTIPLE MYELOMA PANEL, SERUM
Albumin SerPl Elph-Mcnc: 3.3 g/dL (ref 2.9–4.4)
Albumin/Glob SerPl: 1.5 (ref 0.7–1.7)
Alpha 1: 0.2 g/dL (ref 0.0–0.4)
Alpha2 Glob SerPl Elph-Mcnc: 0.6 g/dL (ref 0.4–1.0)
B-Globulin SerPl Elph-Mcnc: 1 g/dL (ref 0.7–1.3)
Gamma Glob SerPl Elph-Mcnc: 0.4 g/dL (ref 0.4–1.8)
Globulin, Total: 2.3 g/dL (ref 2.2–3.9)
IgA: 283 mg/dL (ref 64–422)
IgG (Immunoglobin G), Serum: 490 mg/dL — ABNORMAL LOW (ref 586–1602)
IgM (Immunoglobulin M), Srm: 13 mg/dL — ABNORMAL LOW (ref 26–217)
Total Protein ELP: 5.6 g/dL — ABNORMAL LOW (ref 6.0–8.5)

## 2022-06-13 NOTE — Progress Notes (Signed)
Subjective:    Patient ID: Stephanie Delacruz, female    DOB: 1934/05/09, 88 y.o.   MRN: [id number] No chief complaint on file.   Stephanie Delacruz is an 88 year old female who returns to clinic today for 73-monthfollow-up for leg swelling.  She has instituted all the conventional therapies for leg swelling.  She elevates her feet every night, wears compression hose during the day, and is walking is much as she can with her fatigue related to her multiple myeloma and back pain.  She states that the compressions socks have reduce the swelling in her left leg considerably.  She is very happy with the status of her legs today.  On her last visit a lymphedema pump was mentioned and she states today that she feels there is no need for this.  She wishes to continue with her conventional therapy she is currently doing.  I am in agreement with her decision.  On examination of her lower extremities today, her left leg remains slightly edematous compared to her right.  Patient states that the swelling is considerably less than when she first [place].  She denies any symptoms related to DVT.  She denies any pain in her lower extremities.  Denies any trouble breathing or leg cramping.    Review of Systems  Constitutional: Negative.   Respiratory: Negative.    Cardiovascular:  Positive for leg swelling.  Gastrointestinal: Negative.   Endocrine: Negative.   Genitourinary: Negative.   Musculoskeletal:  Positive for back pain.  Skin: Negative.   Hematological:  Positive for adenopathy.       Positive for Fatigue related to Multiple Myeloma.   Psychiatric/Behavioral: Negative.         Objective:   Physical Exam Constitutional:      Appearance: Normal appearance. She is normal weight.  Eyes:     Pupils: Pupils are equal, round, and reactive to light.  Cardiovascular:     Rate and Rhythm: Normal rate and regular rhythm.     Pulses: Normal pulses.  Pulmonary:     Effort: Pulmonary effort is  normal.     Breath sounds: Normal breath sounds.  Abdominal:     General: Abdomen is flat. Bowel sounds are normal.     Palpations: Abdomen is soft.  Musculoskeletal:        General: Normal range of motion.     Cervical back: Normal range of motion.  Skin:    General: Skin is warm and dry.     Capillary Refill: Capillary refill takes 2 to 3 seconds.  Neurological:     General: No focal deficit present.     Mental Status: She is alert and oriented to person, place, and time.  Psychiatric:        Mood and Affect: Mood normal.        Behavior: Behavior normal.        Thought Content: Thought content normal.        Judgment: Judgment normal.     BP (!) 145/65 (BP Location: Left Arm)   Pulse 61   Resp 18   Ht _0  (1.626 m)   Wt 139 lb 6.4 oz (63.2 kg)   BMI 23.93 kg/m   Past Medical History:  Diagnosis Date   Anemia    GERD (gastroesophageal reflux disease)    Hypercholesterolemia    Hypertension    Renal insufficiency     Social History   Socioeconomic History  Marital status: Married    Spouse name: Not on file   Number of children: Not on file   Years of education: Not on file   Highest education level: Not on file  Occupational History   Not on file  Tobacco Use   Smoking status: Never   Smokeless tobacco: Never  Substance and Sexual Activity   Alcohol use: No   Drug use: No   Sexual activity: Not on file  Other Topics Concern   Not on file  Social History Narrative   Not on file   Social Determinants of Health   Financial Resource Strain: Not on file  Food Insecurity: Not on file  Transportation Needs: Not on file  Physical Activity: Not on file  Stress: Not on file  Social Connections: Not on file  Intimate Partner Violence: Not on file    Past Surgical History:  Procedure Laterality Date   TONSILLECTOMY     TUBAL LIGATION      Family History  Problem Relation Age of Onset   Hypertension Mother    Arthritis Mother    Hypertension  Father    Stroke Father    Breast cancer Niece     No Known Allergies     Latest Ref Rng & Units 06/10/2022    8:48 AM 05/12/2022   12:37 PM 04/14/2022    9:08 AM  CBC  WBC 4.0 - 10.5 K/uL 4.5  5.1  4.2   Hemoglobin 12.0 - 15.0 g/dL 10.0  10.3  9.8   Hematocrit 36.0 - 46.0 % 31.2  31.3  30.7   Platelets 150 - 400 K/uL 235  289  217       CMP     Component Value Date/Time   NA 140 06/10/2022 0848   K 3.8 06/10/2022 0848   CL 109 06/10/2022 0848   CO2 25 06/10/2022 0848   GLUCOSE 91 06/10/2022 0848   BUN 18 06/10/2022 0848   CREATININE 1.03 (H) 06/10/2022 0848   CALCIUM 8.9 06/10/2022 0848   PROT 6.1 (L) 06/10/2022 0848   ALBUMIN 3.3 (L) 06/10/2022 0848   AST 25 06/10/2022 0848   ALT 18 06/10/2022 0848   ALKPHOS 41 06/10/2022 0848   BILITOT 0.4 06/10/2022 0848   GFRNONAA 53 (L) 06/10/2022 0848     No results found.     Assessment & Plan:   1. Lymphedema Considerable less lower leg edema to her left leg today.  Patient does not want to pursue lymphedema pump today.  She wishes to continue with conventional therapy.  Plan is a 83-monthfollow-up in clinic to assess lower extremity edema.  2. Stage 3a chronic kidney disease ([facility] continue to monitor with new diagnosis of multiple myeloma.  Patient will continue to follow medical regime set up by oncology.  Oncology to follow.  3. Fatigue associated with anemia Patient's chronic anemia now noted to be possibly related to her new diagnosis of multiple myeloma.  Past 3 medical results of the patient's hemoglobin have risen.  Although the patient still states that she gets some type of fatigue upon exercise.  Patient will continue to follow medical regime set up by oncology.  Oncology to follow.  Current Outpatient Medications on File Prior to Visit  Medication Sig Dispense Refill   acyclovir (ZOVIRAX) 400 MG tablet Take 1 tablet (400 mg total) by mouth 2 (two) times daily. 60 tablet 11   amLODipine (NORVASC)  2.5 MG tablet Take 5 mg by mouth  daily.     ascorbic acid (VITAMIN C) 250 MG tablet daily Take 2 tablets every day by oral route.     aspirin 81 MG EC tablet Take 81 mg by mouth daily.     Calcium Carb-Cholecalciferol (CALCIUM 600+D) 600-20 MG-MCG TABS Take 1 tablet by mouth 3 (three) times daily.     dexamethasone (DECADRON) 4 MG tablet Take 5 tablets (20 mg total) by mouth once a week. 60 tablet 0   donepezil (ARICEPT) 10 MG tablet Take 1 tablet (10 mg total) by mouth daily. 90 tablet 2   fenofibrate (TRICOR) 48 MG tablet TAKE 1 TABLET(48 MG) BY MOUTH DAILY 30 tablet 3   fexofenadine (ALLEGRA) 180 MG tablet Take by mouth.     hydrochlorothiazide (HYDRODIURIL) 25 MG tablet 1/2 tablet q day 45 tablet 1   lenalidomide (REVLIMID) 10 MG capsule Take 1 capsule (10 mg total) by mouth daily. Take for 21 days, then hold for 7 days. Repeat every 28 days. 21 capsule 0   losartan (COZAAR) 100 MG tablet Take 1 tablet (100 mg total) by mouth daily. 90 tablet 2   magic mouthwash (multi-ingredient) oral suspension Swish and swallow 5-71m four times a day as needed for mouth pain 480 mL 1   memantine (NAMENDA) 5 MG tablet Take 5 mg by mouth 2 (two) times daily.     metoprolol succinate (TOPROL-XL) 25 MG 24 hr tablet Take 2 tablets (50 mg total) by mouth daily. 180 tablet 1   montelukast (SINGULAIR) 10 MG tablet Take 1 tablet (10 mg total) by mouth See admin instructions. Take 1 tablet one day before Daratumumab injection. 60 tablet 0   Multiple Vitamins-Minerals (WOMENS MULTIVITAMIN PO) Take 1 tablet by mouth daily.     omeprazole (PRILOSEC) 20 MG capsule TAKE 1 CAPSULE(20 MG) BY MOUTH DAILY 90 capsule 3   potassium chloride (KLOR-CON M) 10 MEQ tablet Take 2 tablets (20 mEq total) by mouth daily. 60 tablet 1   No current facility-administered medications on file prior to visit.    There are no Patient Instructions on file for this visit. No follow-ups on file.   BDrema Pry NP

## 2022-06-14 ENCOUNTER — Other Ambulatory Visit: Payer: Self-pay

## 2022-07-01 ENCOUNTER — Other Ambulatory Visit: Payer: Self-pay

## 2022-07-01 DIAGNOSIS — C9000 Multiple myeloma not having achieved remission: Secondary | ICD-10-CM

## 2022-07-01 MED ORDER — LENALIDOMIDE 10 MG PO CAPS: 10.0000 mg | ORAL_CAPSULE | Freq: Every day | ORAL | 0 refills | Status: DC

## 2022-07-03 ENCOUNTER — Encounter: Payer: Medicare Other | Admitting: Internal Medicine

## 2022-07-04 ENCOUNTER — Ambulatory Visit (INDEPENDENT_AMBULATORY_CARE_PROVIDER_SITE_OTHER): Payer: Medicare Other | Admitting: Internal Medicine

## 2022-07-04 ENCOUNTER — Encounter: Payer: Self-pay | Admitting: Internal Medicine

## 2022-07-04 VITALS — BP 144/90 | HR 78 | Temp 98.1°F | Resp 15 | Ht 64.0 in | Wt 138.8 lb

## 2022-07-04 DIAGNOSIS — M7989 Other specified soft tissue disorders: Secondary | ICD-10-CM

## 2022-07-04 DIAGNOSIS — Z0000 Encounter for general adult medical examination without abnormal findings: Secondary | ICD-10-CM | POA: Insufficient documentation

## 2022-07-04 DIAGNOSIS — C9000 Multiple myeloma not having achieved remission: Secondary | ICD-10-CM

## 2022-07-04 DIAGNOSIS — Z8659 Personal history of other mental and behavioral disorders: Secondary | ICD-10-CM | POA: Diagnosis not present

## 2022-07-04 DIAGNOSIS — D649 Anemia, unspecified: Secondary | ICD-10-CM

## 2022-07-04 DIAGNOSIS — I10 Essential (primary) hypertension: Secondary | ICD-10-CM | POA: Diagnosis not present

## 2022-07-04 DIAGNOSIS — R198 Other specified symptoms and signs involving the digestive system and abdomen: Secondary | ICD-10-CM

## 2022-07-04 DIAGNOSIS — E7800 Pure hypercholesterolemia, unspecified: Secondary | ICD-10-CM

## 2022-07-04 DIAGNOSIS — N1831 Chronic kidney disease, stage 3a: Secondary | ICD-10-CM

## 2022-07-04 MED ORDER — HYDROCHLOROTHIAZIDE 25 MG PO TABS: ORAL_TABLET | ORAL | 1 refills | Status: DC

## 2022-07-04 MED ORDER — VALSARTAN 160 MG PO TABS: 160.0000 mg | ORAL_TABLET | Freq: Every day | ORAL | 1 refills | Status: DC

## 2022-07-04 MED ORDER — METOPROLOL SUCCINATE ER 25 MG PO TB24: 50.0000 mg | ORAL_TABLET | Freq: Every day | ORAL | 1 refills | Status: DC

## 2022-07-04 NOTE — Assessment & Plan Note (Signed)
Physical today 07/04/22. PET - 08/28/21

## 2022-07-04 NOTE — Patient Instructions (Signed)
Stop losartn.  Start diovan '160mg'$  per day  Benefiber daily

## 2022-07-04 NOTE — Progress Notes (Unsigned)
Patient ID: Stephanie Delacruz, female   DOB: 1934/02/07, 88 y.o.   MRN: [id number]   Subjective:    Patient ID: Stephanie Delacruz, female    DOB: 01/17/1934, 88 y.o.   MRN: [id number]   Patient here for No chief complaint on file.  Marland Kitchen   HPI Here for physical exam. COVID positive 05/01/22.   Followed by Dr Tasia Catchings - multiple myeloma - revlimid and dexamethasone. Zometa monthly.    Past Medical History:  Diagnosis Date   Anemia    GERD (gastroesophageal reflux disease)    Hypercholesterolemia    Hypertension    Renal insufficiency    Past Surgical History:  Procedure Laterality Date   TONSILLECTOMY     TUBAL LIGATION     Family History  Problem Relation Age of Onset   Hypertension Mother    Arthritis Mother    Hypertension Father    Stroke Father    Breast cancer Niece    Social History   Socioeconomic History   Marital status: Married    Spouse name: Not on file   Number of children: Not on file   Years of education: Not on file   Highest education level: Not on file  Occupational History   Not on file  Tobacco Use   Smoking status: Never   Smokeless tobacco: Never  Substance and Sexual Activity   Alcohol use: No   Drug use: No   Sexual activity: Not on file  Other Topics Concern   Not on file  Social History Narrative   Not on file   Social Determinants of Health   Financial Resource Strain: Not on file  Food Insecurity: Not on file  Transportation Needs: Not on file  Physical Activity: Not on file  Stress: Not on file  Social Connections: Not on file     Review of Systems     Objective:     There were no vitals taken for this visit. Wt Readings from Last 3 Encounters:  06/13/22 139 lb 6.4 oz (63.2 kg)  06/10/22 139 lb 4.8 oz (63.2 kg)  05/12/22 140 lb 14.4 oz (63.9 kg)    Physical Exam   Outpatient Encounter Medications as of 07/04/2022  Medication Sig   acyclovir (ZOVIRAX) 400 MG tablet Take 1 tablet (400 mg total) by mouth 2 (two) times daily.    amLODipine (NORVASC) 2.5 MG tablet Take 5 mg by mouth daily.   ascorbic acid (VITAMIN C) 250 MG tablet daily Take 2 tablets every day by oral route.   aspirin 81 MG EC tablet Take 81 mg by mouth daily.   Calcium Carb-Cholecalciferol (CALCIUM 600+D) 600-20 MG-MCG TABS Take 1 tablet by mouth 3 (three) times daily.   dexamethasone (DECADRON) 4 MG tablet Take 5 tablets (20 mg total) by mouth once a week.   donepezil (ARICEPT) 10 MG tablet Take 1 tablet (10 mg total) by mouth daily.   fenofibrate (TRICOR) 48 MG tablet TAKE 1 TABLET(48 MG) BY MOUTH DAILY   fexofenadine (ALLEGRA) 180 MG tablet Take by mouth.   hydrochlorothiazide (HYDRODIURIL) 25 MG tablet 1/2 tablet q day   lenalidomide (REVLIMID) 10 MG capsule Take 1 capsule (10 mg total) by mouth daily. Take for 21 days, then hold for 7 days. Repeat every 28 days.   losartan (COZAAR) 100 MG tablet Take 1 tablet (100 mg total) by mouth daily.   magic mouthwash (multi-ingredient) oral suspension Swish and swallow 5-56m four times a day as needed for mouth pain  memantine (NAMENDA) 5 MG tablet Take 5 mg by mouth 2 (two) times daily.   metoprolol succinate (TOPROL-XL) 25 MG 24 hr tablet Take 2 tablets (50 mg total) by mouth daily.   montelukast (SINGULAIR) 10 MG tablet Take 1 tablet (10 mg total) by mouth See admin instructions. Take 1 tablet one day before Daratumumab injection.   Multiple Vitamins-Minerals (WOMENS MULTIVITAMIN PO) Take 1 tablet by mouth daily.   omeprazole (PRILOSEC) 20 MG capsule TAKE 1 CAPSULE(20 MG) BY MOUTH DAILY   potassium chloride (KLOR-CON M) 10 MEQ tablet Take 2 tablets (20 mEq total) by mouth daily.   No facility-administered encounter medications on file as of 07/04/2022.     Lab Results  Component Value Date   WBC 4.5 06/10/2022   HGB 10.0 (L) 06/10/2022   HCT 31.2 (L) 06/10/2022   PLT 235 06/10/2022   GLUCOSE 91 06/10/2022   CHOL 115 06/27/2021   TRIG 34 06/27/2021   HDL 51 06/27/2021   LDLCALC 57 06/27/2021    ALT 18 06/10/2022   AST 25 06/10/2022   NA 140 06/10/2022   K 3.8 06/10/2022   CL 109 06/10/2022   CREATININE 1.03 (H) 06/10/2022   BUN 18 06/10/2022   CO2 25 06/10/2022   TSH 0.346 (L) 07/24/2021       Assessment & Plan:   Problem List Items Addressed This Visit   None    Einar Pheasant, MD

## 2022-07-05 DIAGNOSIS — R198 Other specified symptoms and signs involving the digestive system and abdomen: Secondary | ICD-10-CM | POA: Insufficient documentation

## 2022-07-05 NOTE — Assessment & Plan Note (Signed)
Avoid antiinflammatories.  Stay hydrated.  Follow metabolic panel.   

## 2022-07-05 NOTE — Assessment & Plan Note (Signed)
Evaluated and worked up by [facility].  Follow.

## 2022-07-05 NOTE — Assessment & Plan Note (Signed)
Increased soft stool as outlined.  No abdominal pain.  Eating.  No blood.  Trial of benefiber.  Call with update.

## 2022-07-05 NOTE — Assessment & Plan Note (Signed)
Multiple myeloma - IgA kappa.  Treated with Daratumumab and revilimid.  Takes dexamethasone weekly.  Followed by hematology.  Continue aspirin and acyclovir.

## 2022-07-05 NOTE — Assessment & Plan Note (Signed)
Followed by hematology 

## 2022-07-05 NOTE — Assessment & Plan Note (Signed)
On aricept and namenda.  Follow up with neurology.

## 2022-07-05 NOTE — Assessment & Plan Note (Signed)
Blood pressure as outlined.  Currently on toprol, amlodipine, microzide and losartan.  Will hold on increasing amlodipine given problems she has had with lower extremity swelling.  Given increased soft stool, hold on increasing diuretic - to avoid volume depletion.  Will change losartan to diovan '160mg'$  q day.   Follow pressures.  Follow metabolic panel. Sent in readings.  May need to increase to '320mg'$  q day.

## 2022-07-05 NOTE — Assessment & Plan Note (Addendum)
On tricor.  Follow lipid panel.  Will need to see if can add to labs through cancer center.

## 2022-07-07 ENCOUNTER — Other Ambulatory Visit: Payer: Medicare Other

## 2022-07-07 ENCOUNTER — Ambulatory Visit: Payer: Medicare Other | Admitting: Oncology

## 2022-07-07 ENCOUNTER — Ambulatory Visit: Payer: Medicare Other

## 2022-07-08 ENCOUNTER — Inpatient Hospital Stay: Payer: Medicare Other | Attending: Oncology

## 2022-07-08 ENCOUNTER — Inpatient Hospital Stay (HOSPITAL_BASED_OUTPATIENT_CLINIC_OR_DEPARTMENT_OTHER): Payer: Medicare Other | Admitting: Oncology

## 2022-07-08 ENCOUNTER — Inpatient Hospital Stay: Payer: Medicare Other

## 2022-07-08 ENCOUNTER — Encounter: Payer: Self-pay | Admitting: Oncology

## 2022-07-08 VITALS — BP 148/53 | HR 51 | Temp 96.5°F | Resp 16

## 2022-07-08 VITALS — BP 160/62 | HR 56 | Temp 97.5°F | Wt 139.5 lb

## 2022-07-08 DIAGNOSIS — C9000 Multiple myeloma not having achieved remission: Secondary | ICD-10-CM

## 2022-07-08 DIAGNOSIS — E7800 Pure hypercholesterolemia, unspecified: Secondary | ICD-10-CM | POA: Diagnosis not present

## 2022-07-08 DIAGNOSIS — Z803 Family history of malignant neoplasm of breast: Secondary | ICD-10-CM | POA: Insufficient documentation

## 2022-07-08 DIAGNOSIS — M899 Disorder of bone, unspecified: Secondary | ICD-10-CM | POA: Diagnosis not present

## 2022-07-08 DIAGNOSIS — R5383 Other fatigue: Secondary | ICD-10-CM | POA: Insufficient documentation

## 2022-07-08 DIAGNOSIS — Z7961 Long term (current) use of immunomodulator: Secondary | ICD-10-CM | POA: Diagnosis not present

## 2022-07-08 DIAGNOSIS — Z823 Family history of stroke: Secondary | ICD-10-CM | POA: Insufficient documentation

## 2022-07-08 DIAGNOSIS — Z5112 Encounter for antineoplastic immunotherapy: Secondary | ICD-10-CM | POA: Insufficient documentation

## 2022-07-08 DIAGNOSIS — Z8261 Family history of arthritis: Secondary | ICD-10-CM | POA: Insufficient documentation

## 2022-07-08 DIAGNOSIS — N1831 Chronic kidney disease, stage 3a: Secondary | ICD-10-CM | POA: Insufficient documentation

## 2022-07-08 DIAGNOSIS — M549 Dorsalgia, unspecified: Secondary | ICD-10-CM | POA: Insufficient documentation

## 2022-07-08 DIAGNOSIS — Z8249 Family history of ischemic heart disease and other diseases of the circulatory system: Secondary | ICD-10-CM | POA: Diagnosis not present

## 2022-07-08 DIAGNOSIS — Z5111 Encounter for antineoplastic chemotherapy: Secondary | ICD-10-CM

## 2022-07-08 DIAGNOSIS — Z79899 Other long term (current) drug therapy: Secondary | ICD-10-CM | POA: Insufficient documentation

## 2022-07-08 DIAGNOSIS — Z79624 Long term (current) use of inhibitors of nucleotide synthesis: Secondary | ICD-10-CM | POA: Diagnosis not present

## 2022-07-08 DIAGNOSIS — D649 Anemia, unspecified: Secondary | ICD-10-CM | POA: Diagnosis not present

## 2022-07-08 DIAGNOSIS — I129 Hypertensive chronic kidney disease with stage 1 through stage 4 chronic kidney disease, or unspecified chronic kidney disease: Secondary | ICD-10-CM | POA: Insufficient documentation

## 2022-07-08 DIAGNOSIS — R778 Other specified abnormalities of plasma proteins: Secondary | ICD-10-CM

## 2022-07-08 LAB — COMPREHENSIVE METABOLIC PANEL
ALT: 18 U/L (ref 0–44)
AST: 22 U/L (ref 15–41)
Albumin: 3.4 g/dL — ABNORMAL LOW (ref 3.5–5.0)
Alkaline Phosphatase: 39 U/L (ref 38–126)
Anion gap: 9 (ref 5–15)
BUN: 22 mg/dL (ref 8–23)
CO2: 26 mmol/L (ref 22–32)
Calcium: 9.1 mg/dL (ref 8.9–10.3)
Chloride: 108 mmol/L (ref 98–111)
Creatinine, Ser: 0.99 mg/dL (ref 0.44–1.00)
GFR, Estimated: 56 mL/min — ABNORMAL LOW (ref >60)
Glucose, Bld: 98 mg/dL (ref 70–99)
Potassium: 3.8 mmol/L (ref 3.5–5.1)
Sodium: 143 mmol/L (ref 135–145)
Total Bilirubin: 0.6 mg/dL (ref 0.3–1.2)
Total Protein: 6.2 g/dL — ABNORMAL LOW (ref 6.5–8.1)

## 2022-07-08 LAB — CBC WITH DIFFERENTIAL/PLATELET
Abs Immature Granulocytes: 0.01 10*3/uL (ref 0.00–0.07)
Basophils Absolute: 0.1 10*3/uL (ref 0.0–0.1)
Basophils Relative: 2 %
Eosinophils Absolute: 0.1 10*3/uL (ref 0.0–0.5)
Eosinophils Relative: 2 %
HCT: 31.5 % — ABNORMAL LOW (ref 36.0–46.0)
Hemoglobin: 10.2 g/dL — ABNORMAL LOW (ref 12.0–15.0)
Immature Granulocytes: 0 %
Lymphocytes Relative: 50 %
Lymphs Abs: 2.2 10*3/uL (ref 0.7–4.0)
MCH: 31.6 pg (ref 26.0–34.0)
MCHC: 32.4 g/dL (ref 30.0–36.0)
MCV: 97.5 fL (ref 80.0–100.0)
Monocytes Absolute: 0.4 10*3/uL (ref 0.1–1.0)
Monocytes Relative: 9 %
Neutro Abs: 1.6 10*3/uL — ABNORMAL LOW (ref 1.7–7.7)
Neutrophils Relative %: 37 %
Platelets: 226 10*3/uL (ref 150–400)
RBC: 3.23 MIL/uL — ABNORMAL LOW (ref 3.87–5.11)
RDW: 14.7 % (ref 11.5–15.5)
WBC: 4.4 10*3/uL (ref 4.0–10.5)
nRBC: 0 % (ref 0.0–0.2)

## 2022-07-08 MED ORDER — ACETAMINOPHEN 325 MG PO TABS
650.0000 mg | ORAL_TABLET | Freq: Once | ORAL | Status: AC
  Administered 2022-07-08: 650 mg via ORAL
  Filled 2022-07-08: qty 2

## 2022-07-08 MED ORDER — ZOLEDRONIC ACID 4 MG/5ML IV CONC
3.3000 mg | Freq: Once | INTRAVENOUS | Status: AC
  Administered 2022-07-08: 3.3 mg via INTRAVENOUS
  Filled 2022-07-08: qty 4.13

## 2022-07-08 MED ORDER — DIPHENHYDRAMINE HCL 25 MG PO CAPS
50.0000 mg | ORAL_CAPSULE | Freq: Once | ORAL | Status: AC
  Administered 2022-07-08: 50 mg via ORAL
  Filled 2022-07-08: qty 2

## 2022-07-08 MED ORDER — DARATUMUMAB-HYALURONIDASE-FIHJ 1800-30000 MG-UT/15ML ~~LOC~~ SOLN
1800.0000 mg | Freq: Once | SUBCUTANEOUS | Status: AC
  Administered 2022-07-08: 1800 mg via SUBCUTANEOUS
  Filled 2022-07-08: qty 15

## 2022-07-08 MED ORDER — SODIUM CHLORIDE 0.9 % IV SOLN
Freq: Once | INTRAVENOUS | Status: AC
  Filled 2022-07-08: qty 250

## 2022-07-08 MED ORDER — LENALIDOMIDE 10 MG PO CAPS: 10.0000 mg | ORAL_CAPSULE | Freq: Every day | ORAL | 0 refills | Status: DC

## 2022-07-08 MED ORDER — DEXAMETHASONE 4 MG PO TABS: 20.0000 mg | ORAL_TABLET | ORAL | 0 refills | Status: DC

## 2022-07-08 NOTE — Patient Instructions (Signed)
[facility]  Discharge Instructions: Thank you for choosing [facility] to provide your oncology and hematology care.  If you have a lab appointment with the Cancer Center, please go directly to the Cancer Center and check in at the registration area.  Wear comfortable clothing and clothing appropriate for easy access to any Portacath or PICC line.   We strive to give you quality time with your provider. You may need to reschedule your appointment if you arrive late (15 or more minutes).  Arriving late affects you and other patients whose appointments are after yours.  Also, if you miss three or more appointments without notifying the office, you may be dismissed from the clinic at the provider's discretion.      For prescription refill requests, have your pharmacy contact our office and allow 72 hours for refills to be completed.    Today you received the following chemotherapy and/or immunotherapy agents Darzalex      To help prevent nausea and vomiting after your treatment, we encourage you to take your nausea medication as directed.  BELOW ARE SYMPTOMS THAT SHOULD BE REPORTED IMMEDIATELY: *FEVER GREATER THAN 100.4 F (38 C) OR HIGHER *CHILLS OR SWEATING *NAUSEA AND VOMITING THAT IS NOT CONTROLLED WITH YOUR NAUSEA MEDICATION *UNUSUAL SHORTNESS OF BREATH *UNUSUAL BRUISING OR BLEEDING *URINARY PROBLEMS (pain or burning when urinating, or frequent urination) *BOWEL PROBLEMS (unusual diarrhea, constipation, pain near the anus) TENDERNESS IN MOUTH AND THROAT WITH OR WITHOUT PRESENCE OF ULCERS (sore throat, sores in mouth, or a toothache) UNUSUAL RASH, SWELLING OR PAIN  UNUSUAL VAGINAL DISCHARGE OR ITCHING   Items with * indicate a potential emergency and should be followed up as soon as possible or go to the Emergency Department if any problems should occur.  Please show the CHEMOTHERAPY ALERT CARD or IMMUNOTHERAPY ALERT CARD at check-in to  the Emergency Department and triage nurse.  Should you have questions after your visit or need to cancel or reschedule your appointment, please contact [facility]  [phone] and follow the prompts.  Office hours are 8:00 a.m. to 4:30 p.m. Monday - Friday. Please note that voicemails left after 4:00 p.m. may not be returned until the following business day.  We are closed weekends and major holidays. You have access to a nurse at all times for urgent questions. Please call the main number to the clinic [phone] and follow the prompts.  For any non-urgent questions, you may also contact your provider using MyChart. We now offer e-Visits for anyone 18 and older to request care online for non-urgent symptoms. For details visit [email or web address].   Also download the MyChart app! Go to the app store, search "MyChart", open the app, select [facility], and log in with your MyChart username and password.  Masks are optional in the cancer centers. If you would like for your care team to wear a mask while they are taking care of you, please let them know. For doctor visits, patients may have with them one support person who is at least 86 years old. At this time, visitors are not allowed in the infusion area.   

## 2022-07-08 NOTE — Assessment & Plan Note (Signed)
recommend patient to avoid nephrotoxin.  Encourage oral hydration. 

## 2022-07-08 NOTE — Progress Notes (Signed)
Hematology/Oncology Progress note [phone] [phone]         Patient Care Team: Einar Pheasant, MD as PCP - General (Internal Medicine)  ASSESSMENT & PLAN:   Cancer Staging  Multiple myeloma [facility]) Staging form: Plasma Cell Myeloma and Plasma Cell Disorders, AJCC 8th Edition - Clinical stage from 08/22/2021: RISS Stage II (Beta-2-microglobulin (mg/L): 4.1, Albumin (g/dL): 2.8, ISS: Stage II, High-risk cytogenetics: Absent, LDH: Normal) - Signed by Earlie Server, MD on 09/06/2021   Multiple myeloma ([place]) Multiple myeloma, IgA kappa, complex hyperploid gain of 1q Labs reviewed and discussed with patient - VGPR Proceed with Daratumumab.   Revlimid D1-21 Patient takes dexamethasone 20 mg weekly Continue asprin 34m daily and Acyclovir 4087mBID.  Plan to repeat BM biopsy in end Dec 2023  Bone lesion Zometa today. Will increase interval to Q3 months.  Recommend calcium 1200 mg daily.  Normocytic anemia Continue monitor.   Stage 3a chronic kidney disease (HCTyronerecommend patient to avoid nephrotoxin.  Encourage oral hydration.  Encounter for antineoplastic chemotherapy Chemotherapy plan as listed as above.  No orders of the defined types were placed in this encounter.   Follow up  Lab/MD/Dara in 4 weeks  All questions were answered. The patient knows to call the clinic with any problems, questions or concerns.  ZhEarlie ServerMD, PhD [facility] 07/08/2022     CHIEF COMPLAINTS/REASON FOR VISIT:  Follow-up for multiple myeloma  HISTORY OF PRESENTING ILLNESS:   Stephanie L WaBirdsells a  [age].o.  female with PMH listed below was seen in consultation at the request of  YuEarlie ServerMD  for evaluation of anemia 07/15/2021, patient had CBC checked at the primary care provider's office.  CBC showed hemoglobin 7.7, hematocrit 23.9, white count 3.6, RDW 16.5.  Platelet count 166. Reviewed previous lab records.  Patient has chronic anemia dating back  at least 2016. Her baseline prior to 2018 was about 10.   Hemoglobin was 9.1 in March 2022, and further dropped to 8.1 in October 2022, then November dropped to 7.7. Patient denies being significantly affected, tired, lightheadedness, shortness of breath.  Appetite is fair.  Denies any black or bloody stool.   Denies any history of hemoglobinopathy  08/26/2021 bone marrow biopsy showed hypercellular marrow with plasma cell neoplasm.  67% plasma cells in the aspirate.  Cytogenetics showed complex hyperplasia, gain of 1 q.  # 08/26/2021 started on Daratumumab+ dexamethasone # 09/06/2021 added Revlimid 1037m 14 days.  #09/27/2021 till present, cycle 2 Revlimid 10 mg for 21 days.  INTERVAL HISTORY Stephanie L WadTeat a 88 13o. female who has above history reviewed by me today presents for follow up visit for management of multiple myeloma Patient tolerates daratumumab -RD regimen well. She was accompanied by her daughter today.  She denies any nausea vomiting diarrhea.   No new complaints.    Review of Systems  Constitutional:  Positive for fatigue. Negative for appetite change, chills and fever.  HENT:   Negative for hearing loss and voice change.   Eyes:  Negative for eye problems.  Respiratory:  Negative for chest tightness and cough.   Cardiovascular:  Negative for chest pain.  Gastrointestinal:  Negative for abdominal distention, abdominal pain and blood in stool.  Endocrine: Negative for hot flashes.  Genitourinary:  Negative for difficulty urinating and frequency.   Musculoskeletal:  Positive for back pain. Negative for arthralgias.  Skin:  Negative for itching and rash.  Neurological:  Negative for extremity  weakness.  Hematological:  Negative for adenopathy.  Psychiatric/Behavioral:  Negative for confusion.     MEDICAL HISTORY:  Past Medical History:  Diagnosis Date   Anemia    GERD (gastroesophageal reflux disease)    Hypercholesterolemia    Hypertension    Renal insufficiency      SURGICAL HISTORY: Past Surgical History:  Procedure Laterality Date   TONSILLECTOMY     TUBAL LIGATION      SOCIAL HISTORY: Social History   Socioeconomic History   Marital status: Married    Spouse name: Not on file   Number of children: Not on file   Years of education: Not on file   Highest education level: Not on file  Occupational History   Not on file  Tobacco Use   Smoking status: Never   Smokeless tobacco: Never  Substance and Sexual Activity   Alcohol use: No   Drug use: No   Sexual activity: Not on file  Other Topics Concern   Not on file  Social History Narrative   Not on file   Social Determinants of Health   Financial Resource Strain: Not on file  Food Insecurity: Not on file  Transportation Needs: Not on file  Physical Activity: Not on file  Stress: Not on file  Social Connections: Not on file  Intimate Partner Violence: Not on file    FAMILY HISTORY: Family History  Problem Relation Age of Onset   Hypertension Mother    Arthritis Mother    Hypertension Father    Stroke Father    Breast cancer Niece     ALLERGIES:  has No Known Allergies.  MEDICATIONS:  Current Outpatient Medications  Medication Sig Dispense Refill   acyclovir (ZOVIRAX) 400 MG tablet Take 1 tablet (400 mg total) by mouth 2 (two) times daily. 60 tablet 11   amLODipine (NORVASC) 2.5 MG tablet Take 5 mg by mouth daily.     ascorbic acid (VITAMIN C) 250 MG tablet daily Take 2 tablets every day by oral route.     aspirin 81 MG EC tablet Take 81 mg by mouth daily.     Calcium Carb-Cholecalciferol (CALCIUM 600+D) 600-20 MG-MCG TABS Take 1 tablet by mouth 3 (three) times daily.     donepezil (ARICEPT) 10 MG tablet Take 1 tablet (10 mg total) by mouth daily. 90 tablet 2   fenofibrate (TRICOR) 48 MG tablet TAKE 1 TABLET(48 MG) BY MOUTH DAILY 30 tablet 3   fexofenadine (ALLEGRA) 180 MG tablet Take by mouth.     hydrochlorothiazide (HYDRODIURIL) 25 MG tablet 1/2 tablet q day 45  tablet 1   memantine (NAMENDA) 5 MG tablet Take 5 mg by mouth 2 (two) times daily.     metoprolol succinate (TOPROL-XL) 25 MG 24 hr tablet Take 2 tablets (50 mg total) by mouth daily. 180 tablet 1   montelukast (SINGULAIR) 10 MG tablet Take 1 tablet (10 mg total) by mouth See admin instructions. Take 1 tablet one day before Daratumumab injection. 60 tablet 0   Multiple Vitamins-Minerals (WOMENS MULTIVITAMIN PO) Take 1 tablet by mouth daily.     omeprazole (PRILOSEC) 20 MG capsule TAKE 1 CAPSULE(20 MG) BY MOUTH DAILY 90 capsule 3   potassium chloride (KLOR-CON M) 10 MEQ tablet Take 2 tablets (20 mEq total) by mouth daily. 60 tablet 1   valsartan (DIOVAN) 160 MG tablet Take 1 tablet (160 mg total) by mouth daily. 90 tablet 1   dexamethasone (DECADRON) 4 MG tablet Take 5 tablets (20 mg  total) by mouth once a week. 60 tablet 0   [START ON 08/05/2022] lenalidomide (REVLIMID) 10 MG capsule Take 1 capsule (10 mg total) by mouth daily. Take for 21 days, then hold for 7 days. Repeat every 28 days. 21 capsule 0   No current facility-administered medications for this visit.     PHYSICAL EXAMINATION: ECOG PERFORMANCE STATUS: 1 - Symptomatic but completely ambulatory Vitals:   07/08/22 0845  BP: (!) 160/62  Pulse: (!) 56  Temp: (!) 97.5 F (36.4 C)  SpO2: 100%   Filed Weights   07/08/22 0845  Weight: 139 lb 8 oz (63.3 kg)    Physical Exam Constitutional:      General: She is not in acute distress. HENT:     Head: Normocephalic and atraumatic.  Eyes:     General: No scleral icterus. Cardiovascular:     Rate and Rhythm: Normal rate and regular rhythm.     Heart sounds: Normal heart sounds.  Pulmonary:     Effort: Pulmonary effort is normal. No respiratory distress.     Breath sounds: No wheezing.  Abdominal:     General: Bowel sounds are normal. There is no distension.     Palpations: Abdomen is soft.  Musculoskeletal:        General: No deformity. Normal range of motion.     Cervical  back: Normal range of motion and neck supple.  Skin:    General: Skin is warm and dry.     Findings: No erythema or rash.  Neurological:     Mental Status: She is alert and oriented to person, place, and time. Mental status is at baseline.     Cranial Nerves: No cranial nerve deficit.     Coordination: Coordination normal.  Psychiatric:        Mood and Affect: Mood normal.     LABORATORY DATA:  I have reviewed the data as listed    Latest Ref Rng & Units 07/08/2022    8:33 AM 06/10/2022    8:48 AM 05/12/2022   12:37 PM  CBC  WBC 4.0 - 10.5 K/uL 4.4  4.5  5.1   Hemoglobin 12.0 - 15.0 g/dL 10.2  10.0  10.3   Hematocrit 36.0 - 46.0 % 31.5  31.2  31.3   Platelets 150 - 400 K/uL 226  235  289       Latest Ref Rng & Units 07/08/2022    8:33 AM 06/10/2022    8:48 AM 05/12/2022   12:37 PM  CMP  Glucose 70 - 99 mg/dL 98  91  105   BUN 8 - 23 mg/dL _0 Creatinine 0.44 - 1.00 mg/dL 0.99  1.03  0.99   Sodium 135 - 145 mmol/L 143  140  139   Potassium 3.5 - 5.1 mmol/L 3.8  3.8  4.4   Chloride 98 - 111 mmol/L 108  109  108   CO2 22 - 32 mmol/L _1 Calcium 8.9 - 10.3 mg/dL 9.1  8.9  9.0   Total Protein 6.5 - 8.1 g/dL 6.2  6.1  6.7   Total Bilirubin 0.3 - 1.2 mg/dL 0.6  0.4  0.4   Alkaline Phos 38 - 126 U/L 39  41  57   AST 15 - 41 U/L _2 ALT 0 - 44 U/L _3 Iron/TIBC/Ferritin/ %Sat  Component Value Date/Time   IRON 54 07/24/2021 1212   TIBC 277 07/24/2021 1212   FERRITIN 93 07/24/2021 1212   IRONPCTSAT 20 07/24/2021 1212       RADIOGRAPHIC STUDIES: I have personally reviewed the radiological images as listed and agreed with the findings in the report. No results found.

## 2022-07-08 NOTE — Assessment & Plan Note (Signed)
Zometa today. Will increase interval to Q3 months.  Recommend calcium 1200 mg daily.

## 2022-07-08 NOTE — Assessment & Plan Note (Signed)
Continue monitor

## 2022-07-08 NOTE — Addendum Note (Signed)
Addended by: Oneida Arenas on: 07/08/2022 04:30 PM   Modules accepted: Orders

## 2022-07-08 NOTE — Assessment & Plan Note (Signed)
Chemotherapy plan as listed as above. 

## 2022-07-08 NOTE — Assessment & Plan Note (Addendum)
Multiple myeloma, IgA kappa, complex hyperploid gain of 1q Labs reviewed and discussed with patient - VGPR Proceed with Daratumumab.   Revlimid D1-21 Patient takes dexamethasone 20 mg weekly Continue asprin 71m daily and Acyclovir 4069mBID.  Plan to repeat BM biopsy in end Dec 2023

## 2022-07-09 ENCOUNTER — Telehealth: Payer: Self-pay

## 2022-07-09 LAB — KAPPA/LAMBDA LIGHT CHAINS
Kappa free light chain: 14.6 mg/L (ref 3.3–19.4)
Kappa, lambda light chain ratio: 1.97 — ABNORMAL HIGH (ref 0.26–1.65)
Lambda free light chains: 7.4 mg/L (ref 5.7–26.3)

## 2022-07-09 NOTE — Telephone Encounter (Signed)
Patient will need Bone marrow biopsy the week of 12/26. Request has been faxed to IR

## 2022-07-10 ENCOUNTER — Encounter: Payer: Self-pay | Admitting: Oncology

## 2022-07-15 LAB — MULTIPLE MYELOMA PANEL, SERUM
Albumin SerPl Elph-Mcnc: 3.3 g/dL (ref 2.9–4.4)
Albumin/Glob SerPl: 1.6 (ref 0.7–1.7)
Alpha 1: 0.2 g/dL (ref 0.0–0.4)
Alpha2 Glob SerPl Elph-Mcnc: 0.6 g/dL (ref 0.4–1.0)
B-Globulin SerPl Elph-Mcnc: 1.1 g/dL (ref 0.7–1.3)
Gamma Glob SerPl Elph-Mcnc: 0.3 g/dL — ABNORMAL LOW (ref 0.4–1.8)
Globulin, Total: 2.2 g/dL (ref 2.2–3.9)
IgA: 267 mg/dL (ref 64–422)
IgG (Immunoglobin G), Serum: 490 mg/dL — ABNORMAL LOW (ref 586–1602)
IgM (Immunoglobulin M), Srm: 12 mg/dL — ABNORMAL LOW (ref 26–217)
Total Protein ELP: 5.5 g/dL — ABNORMAL LOW (ref 6.0–8.5)

## 2022-07-17 ENCOUNTER — Other Ambulatory Visit: Payer: Self-pay | Admitting: Oncology

## 2022-07-17 ENCOUNTER — Telehealth: Payer: Self-pay

## 2022-07-17 ENCOUNTER — Encounter: Payer: Self-pay | Admitting: Oncology

## 2022-07-17 MED ORDER — POTASSIUM CHLORIDE CRYS ER 10 MEQ PO TBCR: 10.0000 meq | EXTENDED_RELEASE_TABLET | Freq: Every day | ORAL | 1 refills | Status: DC

## 2022-07-17 NOTE — Telephone Encounter (Signed)
Oral Oncology Patient Advocate Encounter  Was successful in securing patient a $12,000 grant from [facility] to provide copayment coverage for Lenalidomide.  This will keep the out of pocket expense at $0.     [employer] ID: [id number]  I have spoken with the patient.   The billing information is as follows and has been shared with [employer].    RxBin: [id number] PCN: [id number] Member ID: [id number] Group ID: [id number] Dates of Eligibility: 07/28/22 through 07/28/23  Fund:  Multiple Myeloma - Medicare [place], [facility] Patient [place]  [phone] (phone) [phone] (fax) 07/17/2022 12:31 PM

## 2022-07-17 NOTE — Telephone Encounter (Signed)
Potassium on 12/12 was 3.8. please advise on refill.

## 2022-07-22 ENCOUNTER — Other Ambulatory Visit: Payer: Self-pay | Admitting: Radiology

## 2022-07-22 DIAGNOSIS — C9000 Multiple myeloma not having achieved remission: Secondary | ICD-10-CM

## 2022-07-22 NOTE — Progress Notes (Signed)
Patient for CT Bone Marrow Biopsy on Wed 07/23/2022, I called and spoke with the patient's daughter on the phone and gave pre-procedure instructions. Pt's daughter was made aware to be here at 7:30a at the new entrance, NPO after MN prior to procedure as well as driver post procedure/recovery/discharge. Pt's daughter stated understanding.  Called 07/22/2022

## 2022-07-23 ENCOUNTER — Ambulatory Visit
Admission: RE | Admit: 2022-07-23 | Discharge: 2022-07-23 | Disposition: A | Discharge status: Home / Self Care | Payer: Medicare Other | Source: Ambulatory Visit | Attending: Oncology | Admitting: Oncology

## 2022-07-23 ENCOUNTER — Other Ambulatory Visit: Payer: Self-pay

## 2022-07-23 DIAGNOSIS — C9000 Multiple myeloma not having achieved remission: Secondary | ICD-10-CM | POA: Diagnosis not present

## 2022-07-23 DIAGNOSIS — Z1379 Encounter for other screening for genetic and chromosomal anomalies: Secondary | ICD-10-CM | POA: Diagnosis not present

## 2022-07-23 DIAGNOSIS — D649 Anemia, unspecified: Secondary | ICD-10-CM | POA: Diagnosis not present

## 2022-07-23 LAB — CBC WITH DIFFERENTIAL/PLATELET
Abs Immature Granulocytes: 0.02 10*3/uL (ref 0.00–0.07)
Basophils Absolute: 0 10*3/uL (ref 0.0–0.1)
Basophils Relative: 1 %
Eosinophils Absolute: 0 10*3/uL (ref 0.0–0.5)
Eosinophils Relative: 0 %
HCT: 29.6 % — ABNORMAL LOW (ref 36.0–46.0)
Hemoglobin: 9.8 g/dL — ABNORMAL LOW (ref 12.0–15.0)
Immature Granulocytes: 0 %
Lymphocytes Relative: 24 %
Lymphs Abs: 1.5 10*3/uL (ref 0.7–4.0)
MCH: 31.5 pg (ref 26.0–34.0)
MCHC: 33.1 g/dL (ref 30.0–36.0)
MCV: 95.2 fL (ref 80.0–100.0)
Monocytes Absolute: 0.7 10*3/uL (ref 0.1–1.0)
Monocytes Relative: 10 %
Neutro Abs: 4.1 10*3/uL (ref 1.7–7.7)
Neutrophils Relative %: 65 %
Platelets: 182 10*3/uL (ref 150–400)
RBC: 3.11 MIL/uL — ABNORMAL LOW (ref 3.87–5.11)
RDW: 14.2 % (ref 11.5–15.5)
WBC: 6.3 10*3/uL (ref 4.0–10.5)
nRBC: 0 % (ref 0.0–0.2)

## 2022-07-23 MED ORDER — MIDAZOLAM HCL 2 MG/2ML IJ SOLN
INTRAMUSCULAR | Status: AC
  Filled 2022-07-23: qty 2

## 2022-07-23 MED ORDER — HEPARIN SOD (PORK) LOCK FLUSH 100 UNIT/ML IV SOLN
INTRAVENOUS | Status: AC
  Administered 2022-07-23: 500 [IU] via INTRAVENOUS
  Filled 2022-07-23: qty 5

## 2022-07-23 MED ORDER — SODIUM CHLORIDE 0.9 % IV SOLN: INTRAVENOUS | Status: DC

## 2022-07-23 MED ORDER — LIDOCAINE HCL (PF) 1 % IJ SOLN
10.0000 mL | Freq: Once | INTRAMUSCULAR | Status: AC
  Administered 2022-07-23: 10 mL
  Filled 2022-07-23: qty 10

## 2022-07-23 MED ORDER — FENTANYL CITRATE (PF) 100 MCG/2ML IJ SOLN
INTRAMUSCULAR | Status: AC
  Filled 2022-07-23: qty 2

## 2022-07-23 MED ORDER — FENTANYL CITRATE (PF) 100 MCG/2ML IJ SOLN
INTRAMUSCULAR | Status: AC | PRN
  Administered 2022-07-23: 25 ug via INTRAVENOUS

## 2022-07-23 MED ORDER — MIDAZOLAM HCL 5 MG/5ML IJ SOLN
INTRAMUSCULAR | Status: AC | PRN
  Administered 2022-07-23: 1 mg via INTRAVENOUS

## 2022-07-23 MED ORDER — HEPARIN SOD (PORK) LOCK FLUSH 100 UNIT/ML IV SOLN
500.0000 [IU] | Freq: Once | INTRAVENOUS | Status: AC
  Filled 2022-07-23: qty 5

## 2022-07-23 NOTE — Progress Notes (Signed)
Patient clinically stable post BMB per Dr Denna Haggard, tolerated well. Vitals stable pre and post procedure. Report given to [facility] post procedure/329, received Versed 1 mg along with Fentanyl 25 mcg IV for procedure. Denies complaints at present time.

## 2022-07-23 NOTE — H&P (Signed)
Interventional Radiology - Inpatient Consult H&P    Referring Provider (current admission): Earlie Server, MD  Reason for Consult: Multiple myeloma     History of Present Illness  Stephanie Delacruz is a 88 y.o. female with a relevant past medical history of MM seen in consultation for BMBx.    Additional Past Medical History Past Medical History:  Diagnosis Date   Anemia    GERD (gastroesophageal reflux disease)    Hypercholesterolemia    Hypertension    Renal insufficiency     Surgical History  Past Surgical History:  Procedure Laterality Date   TONSILLECTOMY     TUBAL LIGATION       Medications  I have reviewed the current medication list. Refer to chart for details. Current Outpatient Medications  Medication Instructions   acyclovir (ZOVIRAX) 400 mg, Oral, 2 times daily   amLODipine (NORVASC) 5 mg, Oral, Daily   ascorbic acid (VITAMIN C) 250 MG tablet daily Take 2 tablets every day by oral route.   aspirin EC 81 mg, Oral, Daily   Calcium Carb-Cholecalciferol (CALCIUM 600+D) 600-20 MG-MCG TABS 1 tablet, Oral, 3 times daily   dexamethasone (DECADRON) 20 mg, Oral, Weekly   donepezil (ARICEPT) 10 mg, Oral, Daily   fenofibrate (TRICOR) 48 MG tablet TAKE 1 TABLET(48 MG) BY MOUTH DAILY   fexofenadine (ALLEGRA) 180 MG tablet Oral   hydrochlorothiazide (HYDRODIURIL) 25 MG tablet 1/2 tablet q day   [START ON 08/05/2022] lenalidomide (REVLIMID) 10 mg, Oral, Daily, Take for 21 days, then hold for 7 days. Repeat every 28 days.   memantine (NAMENDA) 5 mg, Oral, 2 times daily   metoprolol succinate (TOPROL-XL) 50 mg, Oral, Daily   montelukast (SINGULAIR) 10 mg, Oral, See admin instructions, Take 1 tablet one day before Daratumumab injection.   Multiple Vitamins-Minerals (WOMENS MULTIVITAMIN PO) 1 tablet, Oral, Daily   omeprazole (PRILOSEC) 20 MG capsule TAKE 1 CAPSULE(20 MG) BY MOUTH DAILY   potassium chloride (KLOR-CON M) 10 MEQ tablet 10 mEq, Oral, Daily   valsartan (DIOVAN) 160 mg,  Oral, Daily     Allergies No Known Allergies   Physical Exam Current Vitals Temp: 98.2 F (36.8 C) (Temp Source: Oral)  Pulse Rate: 61  Resp: (!) 21  BP: 128/63  SpO2: 100 %  Height: _0  (154.9 cm)  Weight: 65.3 kg  Body mass index is 27.21 kg/m.  General: Alert and answers questions appropriately. No apparent distress. HEENT: Normocephalic, atraumatic. Conjunctivae normal without scleral icterus. Mallampati score: I (soft palate, uvula, fauces, and tonsillar pillars visible) Cardiac: Regular rate.  Pulmonary: Normal work of breathing. On room air. Abdominal: Soft without distension.   Pertinent Lab Results Labs: CBC: WBC/Hgb/Hct/Plts:  6.3/9.8/29.6/182 (12/27 0825)  BMP:   Coagulation:    CBC Trends: Recent Labs    07/23/22 0825  WBC 6.3  HGB 9.8*  HCT 29.6*  PLT 182     Creatinine Trend: No results for input(s): "CREATININE" in the last 72 hours.   Relevant and/or Recent Imaging: N/a    Assessment & Plan Stephanie Delacruz is a 88 y.o. female seen in consultation for BMBx.   Procedure Checklist:  Consent obtained: Risks of the procedure as well as the alternatives and risks of each were explained to the patient and/or caregiver.  Consent for the procedure was obtained and is signed in the bedside chart Consent obtained from: The patient Patient is appropriate candidate for sedation Yes ASA Classification: ASA 2 - Patient with mild systemic disease with no  functional limitations NPO status: 0000  Code status:   Code Status: Full Code Pre-procedural prep necessary: n/a      I spent a total of 15 Minutes in face-to-face in clinical consultation, greater than 50% of which was spent on medical decision-making and counseling/coordinating care for bone marrow biopsy.     Albin Felling, MD  Vascular and Interventional Radiology 07/23/2022 10:02 AM

## 2022-07-23 NOTE — Procedures (Signed)
Interventional Radiology Procedure Note  Date of Procedure: 07/23/2022  Procedure: BMBx  Findings:  1. BMBx right ilium    Complications: No immediate complications noted.   Estimated Blood Loss: minimal  Follow-up and Recommendations: 1. Bedrest 1 hour    Albin Felling, MD  Vascular & Interventional Radiology  07/23/2022 9:47 AM

## 2022-07-30 ENCOUNTER — Other Ambulatory Visit: Payer: Self-pay

## 2022-07-30 DIAGNOSIS — C9000 Multiple myeloma not having achieved remission: Secondary | ICD-10-CM

## 2022-07-30 LAB — SURGICAL PATHOLOGY

## 2022-07-30 MED ORDER — LENALIDOMIDE 10 MG PO CAPS: 10.0000 mg | ORAL_CAPSULE | Freq: Every day | ORAL | 0 refills | Status: DC

## 2022-08-01 ENCOUNTER — Encounter (HOSPITAL_COMMUNITY): Payer: Self-pay | Admitting: Oncology

## 2022-08-05 ENCOUNTER — Encounter: Payer: Self-pay | Admitting: Oncology

## 2022-08-05 ENCOUNTER — Inpatient Hospital Stay (HOSPITAL_BASED_OUTPATIENT_CLINIC_OR_DEPARTMENT_OTHER): Payer: Medicare Other | Admitting: Oncology

## 2022-08-05 ENCOUNTER — Inpatient Hospital Stay: Payer: Medicare Other | Attending: Oncology

## 2022-08-05 ENCOUNTER — Other Ambulatory Visit: Payer: Self-pay | Admitting: Oncology

## 2022-08-05 ENCOUNTER — Inpatient Hospital Stay: Payer: Medicare Other

## 2022-08-05 VITALS — BP 153/64 | HR 62 | Temp 98.0°F | Wt 142.8 lb

## 2022-08-05 DIAGNOSIS — I129 Hypertensive chronic kidney disease with stage 1 through stage 4 chronic kidney disease, or unspecified chronic kidney disease: Secondary | ICD-10-CM | POA: Diagnosis not present

## 2022-08-05 DIAGNOSIS — Z5111 Encounter for antineoplastic chemotherapy: Secondary | ICD-10-CM

## 2022-08-05 DIAGNOSIS — E7800 Pure hypercholesterolemia, unspecified: Secondary | ICD-10-CM | POA: Diagnosis not present

## 2022-08-05 DIAGNOSIS — Z7961 Long term (current) use of immunomodulator: Secondary | ICD-10-CM | POA: Diagnosis not present

## 2022-08-05 DIAGNOSIS — Z823 Family history of stroke: Secondary | ICD-10-CM | POA: Diagnosis not present

## 2022-08-05 DIAGNOSIS — C9000 Multiple myeloma not having achieved remission: Secondary | ICD-10-CM | POA: Diagnosis not present

## 2022-08-05 DIAGNOSIS — Z5112 Encounter for antineoplastic immunotherapy: Secondary | ICD-10-CM | POA: Insufficient documentation

## 2022-08-05 DIAGNOSIS — D649 Anemia, unspecified: Secondary | ICD-10-CM | POA: Insufficient documentation

## 2022-08-05 DIAGNOSIS — R5383 Other fatigue: Secondary | ICD-10-CM | POA: Insufficient documentation

## 2022-08-05 DIAGNOSIS — Z79624 Long term (current) use of inhibitors of nucleotide synthesis: Secondary | ICD-10-CM | POA: Insufficient documentation

## 2022-08-05 DIAGNOSIS — Z8249 Family history of ischemic heart disease and other diseases of the circulatory system: Secondary | ICD-10-CM | POA: Insufficient documentation

## 2022-08-05 DIAGNOSIS — Z79899 Other long term (current) drug therapy: Secondary | ICD-10-CM | POA: Insufficient documentation

## 2022-08-05 DIAGNOSIS — Z803 Family history of malignant neoplasm of breast: Secondary | ICD-10-CM | POA: Diagnosis not present

## 2022-08-05 DIAGNOSIS — Z7982 Long term (current) use of aspirin: Secondary | ICD-10-CM | POA: Diagnosis not present

## 2022-08-05 DIAGNOSIS — N1831 Chronic kidney disease, stage 3a: Secondary | ICD-10-CM

## 2022-08-05 DIAGNOSIS — M899 Disorder of bone, unspecified: Secondary | ICD-10-CM

## 2022-08-05 DIAGNOSIS — M549 Dorsalgia, unspecified: Secondary | ICD-10-CM | POA: Diagnosis not present

## 2022-08-05 DIAGNOSIS — C9001 Multiple myeloma in remission: Secondary | ICD-10-CM | POA: Diagnosis not present

## 2022-08-05 DIAGNOSIS — Z8261 Family history of arthritis: Secondary | ICD-10-CM | POA: Insufficient documentation

## 2022-08-05 LAB — COMPREHENSIVE METABOLIC PANEL
ALT: 20 U/L (ref 0–44)
AST: 25 U/L (ref 15–41)
Albumin: 3.5 g/dL (ref 3.5–5.0)
Alkaline Phosphatase: 46 U/L (ref 38–126)
Anion gap: 7 (ref 5–15)
BUN: 17 mg/dL (ref 8–23)
CO2: 24 mmol/L (ref 22–32)
Calcium: 8.6 mg/dL — ABNORMAL LOW (ref 8.9–10.3)
Chloride: 110 mmol/L (ref 98–111)
Creatinine, Ser: 0.91 mg/dL (ref 0.44–1.00)
GFR, Estimated: >60 mL/min (ref >60)
Glucose, Bld: 116 mg/dL — ABNORMAL HIGH (ref 70–99)
Potassium: 4.2 mmol/L (ref 3.5–5.1)
Sodium: 141 mmol/L (ref 135–145)
Total Bilirubin: 0.3 mg/dL (ref 0.3–1.2)
Total Protein: 6.2 g/dL — ABNORMAL LOW (ref 6.5–8.1)

## 2022-08-05 LAB — CBC WITH DIFFERENTIAL/PLATELET
Abs Immature Granulocytes: 0.01 10*3/uL (ref 0.00–0.07)
Basophils Absolute: 0.1 10*3/uL (ref 0.0–0.1)
Basophils Relative: 1 %
Eosinophils Absolute: 0 10*3/uL (ref 0.0–0.5)
Eosinophils Relative: 0 %
HCT: 30.4 % — ABNORMAL LOW (ref 36.0–46.0)
Hemoglobin: 10.1 g/dL — ABNORMAL LOW (ref 12.0–15.0)
Immature Granulocytes: 0 %
Lymphocytes Relative: 18 %
Lymphs Abs: 1 10*3/uL (ref 0.7–4.0)
MCH: 32.2 pg (ref 26.0–34.0)
MCHC: 33.2 g/dL (ref 30.0–36.0)
MCV: 96.8 fL (ref 80.0–100.0)
Monocytes Absolute: 0.1 10*3/uL (ref 0.1–1.0)
Monocytes Relative: 1 %
Neutro Abs: 4.3 10*3/uL (ref 1.7–7.7)
Neutrophils Relative %: 80 %
Platelets: 245 10*3/uL (ref 150–400)
RBC: 3.14 MIL/uL — ABNORMAL LOW (ref 3.87–5.11)
RDW: 14.6 % (ref 11.5–15.5)
WBC: 5.4 10*3/uL (ref 4.0–10.5)
nRBC: 0 % (ref 0.0–0.2)

## 2022-08-05 MED ORDER — SODIUM CHLORIDE 0.9 % IV SOLN
INTRAVENOUS | Status: DC
  Filled 2022-08-05: qty 250

## 2022-08-05 MED ORDER — ACETAMINOPHEN 325 MG PO TABS
650.0000 mg | ORAL_TABLET | Freq: Once | ORAL | Status: AC
  Administered 2022-08-05: 650 mg via ORAL
  Filled 2022-08-05: qty 2

## 2022-08-05 MED ORDER — DARATUMUMAB-HYALURONIDASE-FIHJ 1800-30000 MG-UT/15ML ~~LOC~~ SOLN
1800.0000 mg | Freq: Once | SUBCUTANEOUS | Status: AC
  Administered 2022-08-05: 1800 mg via SUBCUTANEOUS
  Filled 2022-08-05: qty 15

## 2022-08-05 MED ORDER — ZOLEDRONIC ACID 4 MG/5ML IV CONC
3.3000 mg | Freq: Once | INTRAVENOUS | Status: AC
  Administered 2022-08-05: 3.3 mg via INTRAVENOUS
  Filled 2022-08-05: qty 4.13

## 2022-08-05 MED ORDER — DIPHENHYDRAMINE HCL 25 MG PO CAPS
50.0000 mg | ORAL_CAPSULE | Freq: Once | ORAL | Status: AC
  Administered 2022-08-05: 50 mg via ORAL
  Filled 2022-08-05: qty 2

## 2022-08-05 NOTE — Assessment & Plan Note (Addendum)
Continue Q3 months Zometa- next due March 2024 Recommend calcium 1200 mg daily.

## 2022-08-05 NOTE — Progress Notes (Signed)
Hematology/Oncology Progress note [phone] [phone]         Patient Care Team: Einar Pheasant, MD as PCP - General (Internal Medicine)  ASSESSMENT & PLAN:   Cancer Staging  Multiple myeloma [facility]) Staging form: Plasma Cell Myeloma and Plasma Cell Disorders, AJCC 8th Edition - Clinical stage from 08/22/2021: RISS Stage II (Beta-2-microglobulin (mg/L): 4.1, Albumin (g/dL): 2.8, ISS: Stage II, High-risk cytogenetics: Absent, LDH: Normal) - Signed by Earlie Server, MD on 09/06/2021   Multiple myeloma ([place]) Multiple myeloma, IgA kappa, complex hyperploid gain of 1q Labs and bone marrow biopsy results were reviewed and discussed with patient - 3% plasma cell, no M protein. She is in CR Proceed with Daratumumab Rd.   Revlimid D1-21, Patient takes dexamethasone 20 mg weekly Continue asprin '81mg'$  daily and Acyclovir '400mg'$  BID.  I recommend to start at next cycle with Daratumumab monotherapy maintenance   Stage 3a chronic kidney disease ([place]) recommend patient to avoid nephrotoxin.  Encourage oral hydration.  Normocytic anemia Continue monitor.   Encounter for antineoplastic chemotherapy Chemotherapy plan as listed as above.  Bone lesion Continue Q3 months Zometa- next due March 2024 Recommend calcium 1200 mg daily.  Orders Placed This Encounter  Procedures   CBC with Differential/Platelet    Standing Status:   Future    Number of Occurrences:   1    Standing Expiration Date:   08/05/2023   Follow up  Lab/MD/Dara in 4 weeks  All questions were answered. The patient knows to call the clinic with any problems, questions or concerns.  Earlie Server, MD, PhD [facility] Hematology Oncology 08/05/2022     CHIEF COMPLAINTS/REASON FOR VISIT:  Follow-up for multiple myeloma  HISTORY OF PRESENTING ILLNESS:   Stephanie Delacruz is a  89 y.o.  female presents for follow up of multiple myeloma.  Oncology History  Multiple myeloma ([place])  08/22/2021 Initial Diagnosis    Multiple myeloma ([place])   08/22/2021 Cancer Staging   Staging form: Plasma Cell Myeloma and Plasma Cell Disorders, AJCC 8th Edition - Clinical stage from 08/22/2021: RISS Stage II (Beta-2-microglobulin (mg/L): 4.1, Albumin (g/dL): 2.8, ISS: Stage II, High-risk cytogenetics: Absent, LDH: Normal) - Signed by Earlie Server, MD on 09/06/2021 Stage prefix: Initial diagnosis Beta 2 microglobulin range (mg/L): 3.5 to 5.49 Albumin range (g/dL): Less than 3.5 Cytogenetics: 1q addition   08/26/2021 - 09/01/2022 Chemotherapy   08/26/21 MYELOMA Daratumumab SQ q28d  09/06/2021 added Revlimid '10mg'$  x 14 days.  09/27/2021 starting cycle 2 Revlimid 10 mg 21 days on, 7 days off. Weekly Dexamethasone.    08/26/2021 Bone Marrow Biopsy   Bone marrow biopsy showed hypercellular marrow with plasma cell neoplasm. 67% plasma cells in the aspirate. Cytogenetics showed complex hyperplasia, gain of 1 q.    07/23/2022 Bone Marrow Biopsy   Bone marrow biopsy showed Hypercellular bone marrow for age with trilineage hematopoiesis and 3% plasma cells  Cytogenetics are normal.     INTERVAL HISTORY Stephanie Delacruz is a 89 y.o. female who has above history reviewed by me today presents for follow up visit for management of multiple myeloma Patient tolerates daratumumab -RD regimen well. S/p bone marrow biopsy.  She was accompanied by her daughter today.  She denies any nausea vomiting diarrhea.   No new complaints.    Review of Systems  Constitutional:  Positive for fatigue. Negative for appetite change, chills and fever.  HENT:   Negative for hearing loss and voice change.   Eyes:  Negative for eye problems.  Respiratory:  Negative for chest tightness and cough.   Cardiovascular:  Negative for chest pain.  Gastrointestinal:  Negative for abdominal distention, abdominal pain and blood in stool.  Endocrine: Negative for hot flashes.  Genitourinary:  Negative for difficulty urinating and frequency.   Musculoskeletal:  Positive for  back pain. Negative for arthralgias.  Skin:  Negative for itching and rash.  Neurological:  Negative for extremity weakness.  Hematological:  Negative for adenopathy.  Psychiatric/Behavioral:  Negative for confusion.     MEDICAL HISTORY:  Past Medical History:  Diagnosis Date   Anemia    GERD (gastroesophageal reflux disease)    Hypercholesterolemia    Hypertension    Renal insufficiency     SURGICAL HISTORY: Past Surgical History:  Procedure Laterality Date   TONSILLECTOMY     TUBAL LIGATION      SOCIAL HISTORY: Social History   Socioeconomic History   Marital status: Married    Spouse name: Not on file   Number of children: Not on file   Years of education: Not on file   Highest education level: Not on file  Occupational History   Not on file  Tobacco Use   Smoking status: Never   Smokeless tobacco: Never  Vaping Use   Vaping Use: Never used  Substance and Sexual Activity   Alcohol use: No   Drug use: No   Sexual activity: Not on file  Other Topics Concern   Not on file  Social History Narrative   Lives with husband Jeneen Rinks and daughter Levada Dy. No pets.    Social Determinants of Health   Financial Resource Strain: Not on file  Food Insecurity: Not on file  Transportation Needs: Not on file  Physical Activity: Not on file  Stress: Not on file  Social Connections: Not on file  Intimate Partner Violence: Not on file    FAMILY HISTORY: Family History  Problem Relation Age of Onset   Hypertension Mother    Arthritis Mother    Hypertension Father    Stroke Father    Breast cancer Niece     ALLERGIES:  has No Known Allergies.  MEDICATIONS:  Current Outpatient Medications  Medication Sig Dispense Refill   acyclovir (ZOVIRAX) 400 MG tablet TAKE 1 TABLET(400 MG) BY MOUTH TWICE DAILY 60 tablet 11   amLODipine (NORVASC) 2.5 MG tablet Take 5 mg by mouth daily.     ascorbic acid (VITAMIN C) 250 MG tablet daily Take 2 tablets every day by oral route.      aspirin 81 MG EC tablet Take 81 mg by mouth daily.     Calcium Carb-Cholecalciferol (CALCIUM 600+D) 600-20 MG-MCG TABS Take 1 tablet by mouth 3 (three) times daily.     donepezil (ARICEPT) 10 MG tablet Take 1 tablet (10 mg total) by mouth daily. 90 tablet 2   fenofibrate (TRICOR) 48 MG tablet TAKE 1 TABLET(48 MG) BY MOUTH DAILY 30 tablet 3   fexofenadine (ALLEGRA) 180 MG tablet Take by mouth.     hydrochlorothiazide (HYDRODIURIL) 25 MG tablet 1/2 tablet q day 45 tablet 1   lenalidomide (REVLIMID) 10 MG capsule Take 1 capsule (10 mg total) by mouth daily. Take for 21 days, then hold for 7 days. Repeat every 28 days. 21 capsule 0   memantine (NAMENDA) 5 MG tablet Take 5 mg by mouth 2 (two) times daily.     metoprolol succinate (TOPROL-XL) 25 MG 24 hr tablet Take 2 tablets (50 mg total) by mouth daily. 180 tablet 1  montelukast (SINGULAIR) 10 MG tablet Take 1 tablet (10 mg total) by mouth See admin instructions. Take 1 tablet one day before Daratumumab injection. 60 tablet 0   Multiple Vitamins-Minerals (WOMENS MULTIVITAMIN PO) Take 1 tablet by mouth daily.     omeprazole (PRILOSEC) 20 MG capsule TAKE 1 CAPSULE(20 MG) BY MOUTH DAILY 90 capsule 3   potassium chloride (KLOR-CON M) 10 MEQ tablet Take 1 tablet (10 mEq total) by mouth daily. 30 tablet 1   valsartan (DIOVAN) 160 MG tablet Take 1 tablet (160 mg total) by mouth daily. 90 tablet 1   dexamethasone (DECADRON) 4 MG tablet Take 5 tablets (20 mg total) by mouth once a week. (Patient not taking: Reported on 07/23/2022) 60 tablet 0   No current facility-administered medications for this visit.   Facility-Administered Medications Ordered in Other Visits  Medication Dose Route Frequency Provider Last Rate Last Admin   0.9 %  sodium chloride infusion   Intravenous Continuous Earlie Server, MD   Stopped at 08/05/22 1529     PHYSICAL EXAMINATION: ECOG PERFORMANCE STATUS: 1 - Symptomatic but completely ambulatory Vitals:   08/05/22 1335  BP: (!)  153/64  Pulse: 62  Temp: 98 F (36.7 C)  SpO2: 100%   Filed Weights   08/05/22 1335  Weight: 142 lb 12.8 oz (64.8 kg)    Physical Exam Constitutional:      General: She is not in acute distress. HENT:     Head: Normocephalic and atraumatic.  Eyes:     General: No scleral icterus. Cardiovascular:     Rate and Rhythm: Normal rate and regular rhythm.     Heart sounds: Normal heart sounds.  Pulmonary:     Effort: Pulmonary effort is normal. No respiratory distress.     Breath sounds: No wheezing.  Abdominal:     General: Bowel sounds are normal. There is no distension.     Palpations: Abdomen is soft.  Musculoskeletal:        General: No deformity. Normal range of motion.     Cervical back: Normal range of motion and neck supple.  Skin:    General: Skin is warm and dry.     Findings: No erythema or rash.  Neurological:     Mental Status: She is alert and oriented to person, place, and time. Mental status is at baseline.     Cranial Nerves: No cranial nerve deficit.     Coordination: Coordination normal.  Psychiatric:        Mood and Affect: Mood normal.     LABORATORY DATA:  I have reviewed the data as listed    Latest Ref Rng & Units 08/05/2022    2:08 PM 07/23/2022    8:25 AM 07/08/2022    8:33 AM  CBC  WBC 4.0 - 10.5 K/uL 5.4  6.3  4.4   Hemoglobin 12.0 - 15.0 g/dL 10.1  9.8  10.2   Hematocrit 36.0 - 46.0 % 30.4  29.6  31.5   Platelets 150 - 400 K/uL 245  182  226       Latest Ref Rng & Units 08/05/2022    1:21 PM 07/08/2022    8:33 AM 06/10/2022    8:48 AM  CMP  Glucose 70 - 99 mg/dL 116  98  91   BUN 8 - 23 mg/dL '17  22  18   '$ Creatinine 0.44 - 1.00 mg/dL 0.91  0.99  1.03   Sodium 135 - 145 mmol/L 141  143  140   Potassium 3.5 - 5.1 mmol/L 4.2  3.8  3.8   Chloride 98 - 111 mmol/L 110  108  109   CO2 22 - 32 mmol/L '24  26  25   '$ Calcium 8.9 - 10.3 mg/dL 8.6  9.1  8.9   Total Protein 6.5 - 8.1 g/dL 6.2  6.2  6.1   Total Bilirubin 0.3 - 1.2 mg/dL 0.3  0.6   0.4   Alkaline Phos 38 - 126 U/L 46  39  41   AST 15 - 41 U/L '25  22  25   '$ ALT 0 - 44 U/L '20  18  18      '$ Iron/TIBC/Ferritin/ %Sat    Component Value Date/Time   IRON 54 07/24/2021 1212   TIBC 277 07/24/2021 1212   FERRITIN 93 07/24/2021 1212   IRONPCTSAT 20 07/24/2021 1212       RADIOGRAPHIC STUDIES: I have personally reviewed the radiological images as listed and agreed with the findings in the report. CT BONE MARROW BIOPSY & ASPIRATION  Result Date: 07/23/2022 INDICATION: Multiple myeloma EXAM: CT BONE MARROW BIOPSY AND ASPIRATION MEDICATIONS: None. ANESTHESIA/SEDATION: Moderate (conscious) sedation was employed during this procedure. A total of Versed 1 mg and Fentanyl 25 mcg was administered intravenously. Moderate Sedation Time: 10 minutes. The patient's level of consciousness and vital signs were monitored continuously by radiology nursing throughout the procedure under my direct supervision. FLUOROSCOPY TIME:  N/a COMPLICATIONS: None immediate. PROCEDURE: Informed written consent was obtained from the patient after a thorough discussion of the procedural risks, benefits and alternatives. All questions were addressed. Maximal Sterile Barrier Technique was utilized including caps, mask, sterile gowns, sterile gloves, sterile drape, hand hygiene and skin antiseptic. A timeout was performed prior to the initiation of the procedure. The patient was placed prone on the CT exam table. Limited CT of the pelvis was performed for planning purposes. Skin entry site was marked, and the overlying skin was prepped and draped in the standard sterile fashion. Local analgesia was obtained with 1% lidocaine. Using CT guidance, an 11 gauge needle was advanced just deep to the cortex of the right posterior ilium. Subsequently, bone marrow aspiration and core biopsy were performed. Specimens were submitted to lab/pathology for handling. Hemostasis was achieved with manual pressure, and a clean dressing  was placed. The patient tolerated the procedure well without immediate complication. IMPRESSION: Successful CT-guided bone marrow aspiration and core biopsy of the right posterior ilium. Electronically Signed   By: Albin Felling M.D.   On: 07/23/2022 14:44

## 2022-08-05 NOTE — Assessment & Plan Note (Signed)
Continue monitor

## 2022-08-05 NOTE — Assessment & Plan Note (Addendum)
Multiple myeloma, IgA kappa, complex hyperploid gain of 1q Labs and bone marrow biopsy results were reviewed and discussed with patient - 3% plasma cell, no M protein. She is in CR Proceed with Daratumumab Rd.   Revlimid D1-21, Patient takes dexamethasone 20 mg weekly Continue asprin '81mg'$  daily and Acyclovir '400mg'$  BID.  I recommend to start at next cycle with Daratumumab monotherapy maintenance

## 2022-08-05 NOTE — Assessment & Plan Note (Signed)
recommend patient to avoid nephrotoxin.  Encourage oral hydration. 

## 2022-08-05 NOTE — Patient Instructions (Signed)
[facility]  Discharge Instructions: Thank you for choosing [facility] to provide your oncology and hematology care.  If you have a lab appointment with the Cancer Center, please go directly to the Cancer Center and check in at the registration area.  Wear comfortable clothing and clothing appropriate for easy access to any Portacath or PICC line.   We strive to give you quality time with your provider. You may need to reschedule your appointment if you arrive late (15 or more minutes).  Arriving late affects you and other patients whose appointments are after yours.  Also, if you miss three or more appointments without notifying the office, you may be dismissed from the clinic at the provider's discretion.      For prescription refill requests, have your pharmacy contact our office and allow 72 hours for refills to be completed.    Today you received the following chemotherapy and/or immunotherapy agents: Darzalex Faspro      To help prevent nausea and vomiting after your treatment, we encourage you to take your nausea medication as directed.  BELOW ARE SYMPTOMS THAT SHOULD BE REPORTED IMMEDIATELY: *FEVER GREATER THAN 100.4 F (38 C) OR HIGHER *CHILLS OR SWEATING *NAUSEA AND VOMITING THAT IS NOT CONTROLLED WITH YOUR NAUSEA MEDICATION *UNUSUAL SHORTNESS OF BREATH *UNUSUAL BRUISING OR BLEEDING *URINARY PROBLEMS (pain or burning when urinating, or frequent urination) *BOWEL PROBLEMS (unusual diarrhea, constipation, pain near the anus) TENDERNESS IN MOUTH AND THROAT WITH OR WITHOUT PRESENCE OF ULCERS (sore throat, sores in mouth, or a toothache) UNUSUAL RASH, SWELLING OR PAIN  UNUSUAL VAGINAL DISCHARGE OR ITCHING   Items with * indicate a potential emergency and should be followed up as soon as possible or go to the Emergency Department if any problems should occur.  Please show the CHEMOTHERAPY ALERT CARD or IMMUNOTHERAPY ALERT CARD at  check-in to the Emergency Department and triage nurse.  Should you have questions after your visit or need to cancel or reschedule your appointment, please contact [facility]  [phone] and follow the prompts.  Office hours are 8:00 a.m. to 4:30 p.m. Monday - Friday. Please note that voicemails left after 4:00 p.m. may not be returned until the following business day.  We are closed weekends and major holidays. You have access to a nurse at all times for urgent questions. Please call the main number to the clinic [phone] and follow the prompts.  For any non-urgent questions, you may also contact your provider using MyChart. We now offer e-Visits for anyone 18 and older to request care online for non-urgent symptoms. For details visit [email or web address].   Also download the MyChart app! Go to the app store, search "MyChart", open the app, select [facility], and log in with your MyChart username and password.  Masks are optional in the cancer centers. If you would like for your care team to wear a mask while they are taking care of you, please let them know. For doctor visits, patients may have with them one support person who is at least 87 years old. At this time, visitors are not allowed in the infusion area.   

## 2022-08-05 NOTE — Assessment & Plan Note (Signed)
Chemotherapy plan as listed as above. 

## 2022-08-06 LAB — KAPPA/LAMBDA LIGHT CHAINS
Kappa free light chain: 16.8 mg/L (ref 3.3–19.4)
Kappa, lambda light chain ratio: 2.1 — ABNORMAL HIGH (ref 0.26–1.65)
Lambda free light chains: 8 mg/L (ref 5.7–26.3)

## 2022-08-08 ENCOUNTER — Encounter: Payer: Self-pay | Admitting: Oncology

## 2022-08-08 LAB — MULTIPLE MYELOMA PANEL, SERUM
Albumin SerPl Elph-Mcnc: 3.4 g/dL (ref 2.9–4.4)
Albumin/Glob SerPl: 1.5 (ref 0.7–1.7)
Alpha 1: 0.2 g/dL (ref 0.0–0.4)
Alpha2 Glob SerPl Elph-Mcnc: 0.7 g/dL (ref 0.4–1.0)
B-Globulin SerPl Elph-Mcnc: 1 g/dL (ref 0.7–1.3)
Gamma Glob SerPl Elph-Mcnc: 0.4 g/dL (ref 0.4–1.8)
Globulin, Total: 2.4 g/dL (ref 2.2–3.9)
IgA: 272 mg/dL (ref 64–422)
IgG (Immunoglobin G), Serum: 495 mg/dL — ABNORMAL LOW (ref 586–1602)
IgM (Immunoglobulin M), Srm: 12 mg/dL — ABNORMAL LOW (ref 26–217)
Total Protein ELP: 5.8 g/dL — ABNORMAL LOW (ref 6.0–8.5)

## 2022-08-11 NOTE — Progress Notes (Signed)
The patient has been taking dexamethasone at home prior to daratumumab injections. After discussion with the provider, the offset time of daratumumab was changed to 30 minutes, starting 09/02/2022, in order to reduce patient wait time. A calendar was not sent to the patient because she has already been taking dexamethasone at home.  Laray Anger, PharmD PGY-2 Pharmacy Resident Hematology/Oncology [phone]  08/11/2022 4:05 PM

## 2022-08-15 ENCOUNTER — Ambulatory Visit (INDEPENDENT_AMBULATORY_CARE_PROVIDER_SITE_OTHER): Payer: Medicare Other | Admitting: Internal Medicine

## 2022-08-15 ENCOUNTER — Encounter: Payer: Self-pay | Admitting: Internal Medicine

## 2022-08-15 VITALS — BP 136/74 | HR 72 | Temp 98.1°F | Resp 16 | Ht 63.0 in | Wt 139.0 lb

## 2022-08-15 DIAGNOSIS — Z862 Personal history of diseases of the blood and blood-forming organs and certain disorders involving the immune mechanism: Secondary | ICD-10-CM

## 2022-08-15 DIAGNOSIS — I10 Essential (primary) hypertension: Secondary | ICD-10-CM | POA: Diagnosis not present

## 2022-08-15 DIAGNOSIS — C9001 Multiple myeloma in remission: Secondary | ICD-10-CM

## 2022-08-15 DIAGNOSIS — F0390 Unspecified dementia without behavioral disturbance: Secondary | ICD-10-CM

## 2022-08-15 DIAGNOSIS — E0789 Other specified disorders of thyroid: Secondary | ICD-10-CM

## 2022-08-15 DIAGNOSIS — N1831 Chronic kidney disease, stage 3a: Secondary | ICD-10-CM

## 2022-08-15 DIAGNOSIS — E7800 Pure hypercholesterolemia, unspecified: Secondary | ICD-10-CM

## 2022-08-15 NOTE — Progress Notes (Signed)
Subjective:    Patient ID: Stephanie Delacruz, female    DOB: 1934-04-10, 89 y.o.   MRN: [id number]  Patient here for  Chief Complaint  Patient presents with   Hypertension    HPI Here to follow up regarding her blood pressure.  She is accompanied by her daughter.  History obtained from both of them.  Being followed by hematology for treatment - multiple myeloma. Treated with Daratumumab and revilimid. S/p bone marro biopsy - 3% plasma cell, no M protein.  In CR. Continues aspirin daily.  Last visit, losartan was changed to diovan to see if could achieve better blood pressure control.  Tolerating.  Blood pressure is varying.  Denies any chest pain.  Breathing stable.  Eating.  No vomiting.  No abdominal pain or bowel change.  Had questions regarding covid and RSV vaccines.     Past Medical History:  Diagnosis Date   Anemia    GERD (gastroesophageal reflux disease)    Hypercholesterolemia    Hypertension    Renal insufficiency    Past Surgical History:  Procedure Laterality Date   TONSILLECTOMY     TUBAL LIGATION     Family History  Problem Relation Age of Onset   Hypertension Mother    Arthritis Mother    Hypertension Father    Stroke Father    Breast cancer Niece    Social History   Socioeconomic History   Marital status: Married    Spouse name: Not on file   Number of children: Not on file   Years of education: Not on file   Highest education level: Not on file  Occupational History   Not on file  Tobacco Use   Smoking status: Never   Smokeless tobacco: Never  Vaping Use   Vaping Use: Never used  Substance and Sexual Activity   Alcohol use: No   Drug use: No   Sexual activity: Not on file  Other Topics Concern   Not on file  Social History Narrative   Lives with husband Jeneen Rinks and daughter Levada Dy. No pets.    Social Determinants of Health   Financial Resource Strain: Not on file  Food Insecurity: Not on file  Transportation Needs: Not on file  Physical  Activity: Not on file  Stress: Not on file  Social Connections: Not on file     Review of Systems  Constitutional:  Negative for appetite change and unexpected weight change.  HENT:  Negative for congestion and sinus pressure.   Respiratory:  Negative for cough, chest tightness and shortness of breath.   Cardiovascular:  Negative for chest pain and palpitations.       No increased swelling.   Gastrointestinal:  Negative for abdominal pain, diarrhea, nausea and vomiting.  Genitourinary:  Negative for difficulty urinating and dysuria.  Musculoskeletal:  Negative for joint swelling and myalgias.  Skin:  Negative for color change and rash.  Neurological:  Negative for dizziness and headaches.  Psychiatric/Behavioral:  Negative for agitation and dysphoric mood.        Objective:     BP 136/74   Pulse 72   Temp 98.1 F (36.7 C)   Resp 16   Ht '5\' 3"'$  (1.6 m)   Wt 139 lb (63 kg)   SpO2 99%   BMI 24.62 kg/m  Wt Readings from Last 3 Encounters:  08/15/22 139 lb (63 kg)  08/05/22 142 lb 12.8 oz (64.8 kg)  07/23/22 144 lb (65.3 kg)    Physical  Exam Vitals reviewed.  Constitutional:      General: She is not in acute distress.    Appearance: Normal appearance.  HENT:     Head: Normocephalic and atraumatic.     Right Ear: External ear normal.     Left Ear: External ear normal.  Eyes:     General: No scleral icterus.       Right eye: No discharge.        Left eye: No discharge.     Conjunctiva/sclera: Conjunctivae normal.  Neck:     Thyroid: No thyromegaly.     Comments: Palpable - thyroid - enlarged.   Cardiovascular:     Rate and Rhythm: Normal rate and regular rhythm.  Pulmonary:     Effort: No respiratory distress.     Breath sounds: Normal breath sounds. No wheezing.  Abdominal:     General: Bowel sounds are normal.     Palpations: Abdomen is soft.     Tenderness: There is no abdominal tenderness.  Musculoskeletal:        General: No tenderness.     Cervical  back: Neck supple. No tenderness.     Comments: No increased swelling.   Lymphadenopathy:     Cervical: No cervical adenopathy.  Skin:    Findings: No erythema or rash.  Neurological:     Mental Status: She is alert.  Psychiatric:        Mood and Affect: Mood normal.        Behavior: Behavior normal.      Outpatient Encounter Medications as of 08/15/2022  Medication Sig   acyclovir (ZOVIRAX) 400 MG tablet TAKE 1 TABLET(400 MG) BY MOUTH TWICE DAILY   amLODipine (NORVASC) 2.5 MG tablet Take 5 mg by mouth daily.   ascorbic acid (VITAMIN C) 250 MG tablet daily Take 2 tablets every day by oral route.   aspirin 81 MG EC tablet Take 81 mg by mouth daily.   Calcium Carb-Cholecalciferol (CALCIUM 600+D) 600-20 MG-MCG TABS Take 1 tablet by mouth 3 (three) times daily.   dexamethasone (DECADRON) 4 MG tablet Take 5 tablets (20 mg total) by mouth once a week. (Patient not taking: Reported on 07/23/2022)   donepezil (ARICEPT) 10 MG tablet Take 1 tablet (10 mg total) by mouth daily.   fenofibrate (TRICOR) 48 MG tablet TAKE 1 TABLET(48 MG) BY MOUTH DAILY   fexofenadine (ALLEGRA) 180 MG tablet Take by mouth.   hydrochlorothiazide (HYDRODIURIL) 25 MG tablet 1/2 tablet q day   lenalidomide (REVLIMID) 10 MG capsule Take 1 capsule (10 mg total) by mouth daily. Take for 21 days, then hold for 7 days. Repeat every 28 days.   memantine (NAMENDA) 5 MG tablet Take 5 mg by mouth 2 (two) times daily.   metoprolol succinate (TOPROL-XL) 25 MG 24 hr tablet Take 2 tablets (50 mg total) by mouth daily.   montelukast (SINGULAIR) 10 MG tablet Take 1 tablet (10 mg total) by mouth See admin instructions. Take 1 tablet one day before Daratumumab injection.   Multiple Vitamins-Minerals (WOMENS MULTIVITAMIN PO) Take 1 tablet by mouth daily.   omeprazole (PRILOSEC) 20 MG capsule TAKE 1 CAPSULE(20 MG) BY MOUTH DAILY   potassium chloride (KLOR-CON M) 10 MEQ tablet Take 1 tablet (10 mEq total) by mouth daily.   valsartan  (DIOVAN) 160 MG tablet Take 1 tablet (160 mg total) by mouth daily.   No facility-administered encounter medications on file as of 08/15/2022.     Lab Results  Component Value Date  WBC 5.4 08/05/2022   HGB 10.1 (L) 08/05/2022   HCT 30.4 (L) 08/05/2022   PLT 245 08/05/2022   GLUCOSE 116 (H) 08/05/2022   CHOL 115 06/27/2021   TRIG 34 06/27/2021   HDL 51 06/27/2021   LDLCALC 57 06/27/2021   ALT 20 08/05/2022   AST 25 08/05/2022   NA 141 08/05/2022   K 4.2 08/05/2022   CL 110 08/05/2022   CREATININE 0.91 08/05/2022   BUN 17 08/05/2022   CO2 24 08/05/2022   TSH 0.346 (L) 07/24/2021    CT BONE MARROW BIOPSY & ASPIRATION  Result Date: 07/23/2022 INDICATION: Multiple myeloma EXAM: CT BONE MARROW BIOPSY AND ASPIRATION MEDICATIONS: None. ANESTHESIA/SEDATION: Moderate (conscious) sedation was employed during this procedure. A total of Versed 1 mg and Fentanyl 25 mcg was administered intravenously. Moderate Sedation Time: 10 minutes. The patient's level of consciousness and vital signs were monitored continuously by radiology nursing throughout the procedure under my direct supervision. FLUOROSCOPY TIME:  N/a COMPLICATIONS: None immediate. PROCEDURE: Informed written consent was obtained from the patient after a thorough discussion of the procedural risks, benefits and alternatives. All questions were addressed. Maximal Sterile Barrier Technique was utilized including caps, mask, sterile gowns, sterile gloves, sterile drape, hand hygiene and skin antiseptic. A timeout was performed prior to the initiation of the procedure. The patient was placed prone on the CT exam table. Limited CT of the pelvis was performed for planning purposes. Skin entry site was marked, and the overlying skin was prepped and draped in the standard sterile fashion. Local analgesia was obtained with 1% lidocaine. Using CT guidance, an 11 gauge needle was advanced just deep to the cortex of the right posterior ilium.  Subsequently, bone marrow aspiration and core biopsy were performed. Specimens were submitted to lab/pathology for handling. Hemostasis was achieved with manual pressure, and a clean dressing was placed. The patient tolerated the procedure well without immediate complication. IMPRESSION: Successful CT-guided bone marrow aspiration and core biopsy of the right posterior ilium. Electronically Signed   By: Albin Felling M.D.   On: 07/23/2022 14:44       Assessment & Plan:  Essential (primary) hypertension Assessment & Plan: Blood pressure on check here ok.  Recheck - systolic in the 803O.  Will have them to continue to monitor.  If persistent elevation, will increase diovan to '320mg'$  q day.  Follow pressures.  Follow metabolic panel.    Pure hypercholesterolemia Assessment & Plan: On tricor.  Follow lipid panel.  Will need to see if can add to labs through cancer center.    History of anemia Assessment & Plan: Being followed by hematology.  hgb stable.    Dementia without behavioral disturbance, psychotic disturbance, mood disturbance, or anxiety, unspecified dementia severity, unspecified dementia type [facility]) Assessment & Plan: On aricept and namenda.  Continue f/u with neurology.    Multiple myeloma in remission [facility]) Assessment & Plan: Being followed by hematology for treatment - multiple myeloma. Treated with Daratumumab and revilimid. S/p bone marro biopsy - 3% plasma cell, no M protein.  In CR. Continues aspirin daily.    Stage 3a chronic kidney disease (HCC) Assessment & Plan: Avoid antiinflammatories.  Stay hydrated.  Follow metabolic panel.     Thyroid fullness Assessment & Plan: Noted on exam.  Check thyroid ultrasound.   Orders: -     US THYROID; Future     Einar Pheasant, MD

## 2022-08-18 ENCOUNTER — Encounter: Payer: Self-pay | Admitting: Internal Medicine

## 2022-08-18 DIAGNOSIS — E0789 Other specified disorders of thyroid: Secondary | ICD-10-CM | POA: Insufficient documentation

## 2022-08-18 NOTE — Assessment & Plan Note (Signed)
Noted on exam.  Check thyroid ultrasound.

## 2022-08-18 NOTE — Assessment & Plan Note (Signed)
On aricept and namenda.  Continue f/u with neurology.

## 2022-08-18 NOTE — Assessment & Plan Note (Signed)
Being followed by hematology.  hgb stable.

## 2022-08-18 NOTE — Assessment & Plan Note (Signed)
Blood pressure on check here ok.  Recheck - systolic in the 943E.  Will have them to continue to monitor.  If persistent elevation, will increase diovan to '320mg'$  q day.  Follow pressures.  Follow metabolic panel.

## 2022-08-18 NOTE — Assessment & Plan Note (Signed)
Being followed by hematology for treatment - multiple myeloma. Treated with Daratumumab and revilimid. S/p bone marro biopsy - 3% plasma cell, no M protein.  In CR. Continues aspirin daily.

## 2022-08-18 NOTE — Assessment & Plan Note (Signed)
On tricor.  Follow lipid panel.  Will need to see if can add to labs through cancer center.

## 2022-08-18 NOTE — Assessment & Plan Note (Signed)
Avoid antiinflammatories.  Stay hydrated.  Follow metabolic panel.   

## 2022-08-20 ENCOUNTER — Other Ambulatory Visit: Payer: Self-pay

## 2022-08-20 ENCOUNTER — Telehealth: Payer: Self-pay

## 2022-08-20 DIAGNOSIS — E0789 Other specified disorders of thyroid: Secondary | ICD-10-CM

## 2022-08-20 DIAGNOSIS — C9001 Multiple myeloma in remission: Secondary | ICD-10-CM

## 2022-08-20 NOTE — Telephone Encounter (Signed)
TSH and free T4 labs ordered to be done at patients 2/6 appointment.

## 2022-08-25 ENCOUNTER — Other Ambulatory Visit: Payer: Self-pay

## 2022-08-25 DIAGNOSIS — C9000 Multiple myeloma not having achieved remission: Secondary | ICD-10-CM

## 2022-08-25 MED ORDER — LENALIDOMIDE 10 MG PO CAPS: 10.0000 mg | ORAL_CAPSULE | Freq: Every day | ORAL | 0 refills | Status: DC

## 2022-08-27 ENCOUNTER — Ambulatory Visit
Admission: RE | Admit: 2022-08-27 | Discharge: 2022-08-27 | Disposition: A | Discharge status: Home / Self Care | Payer: Medicare Other | Source: Ambulatory Visit | Attending: Internal Medicine | Admitting: Internal Medicine

## 2022-08-27 ENCOUNTER — Encounter: Payer: Self-pay | Admitting: Oncology

## 2022-08-27 DIAGNOSIS — E0789 Other specified disorders of thyroid: Secondary | ICD-10-CM | POA: Insufficient documentation

## 2022-08-27 DIAGNOSIS — E041 Nontoxic single thyroid nodule: Secondary | ICD-10-CM | POA: Diagnosis not present

## 2022-08-28 ENCOUNTER — Telehealth: Payer: Self-pay

## 2022-08-28 ENCOUNTER — Other Ambulatory Visit: Payer: Self-pay

## 2022-08-28 DIAGNOSIS — E0789 Other specified disorders of thyroid: Secondary | ICD-10-CM

## 2022-08-28 NOTE — Telephone Encounter (Signed)
   LM FOR PT TO [facility] RE:      Please call and notify - thyroid ultrasound reveals changes that appear to be consistent with multinodular goiter.  There are a few nodules that warrant further evaluation.  I would like to refer her to endocrinology for further evaluation and question of need for further w/up (biopsy, etc).

## 2022-08-29 ENCOUNTER — Telehealth: Payer: Self-pay | Admitting: Internal Medicine

## 2022-08-29 NOTE — Telephone Encounter (Signed)
Lft pt vm to call ofc . thanks 

## 2022-09-02 ENCOUNTER — Inpatient Hospital Stay: Payer: Medicare Other | Attending: Oncology

## 2022-09-02 ENCOUNTER — Encounter: Payer: Self-pay | Admitting: Oncology

## 2022-09-02 ENCOUNTER — Inpatient Hospital Stay (HOSPITAL_BASED_OUTPATIENT_CLINIC_OR_DEPARTMENT_OTHER): Payer: Medicare Other | Admitting: Oncology

## 2022-09-02 ENCOUNTER — Inpatient Hospital Stay: Payer: Medicare Other

## 2022-09-02 VITALS — BP 149/54 | HR 58 | Temp 98.0°F | Resp 18 | Wt 143.0 lb

## 2022-09-02 DIAGNOSIS — C9000 Multiple myeloma not having achieved remission: Secondary | ICD-10-CM | POA: Diagnosis not present

## 2022-09-02 DIAGNOSIS — Z803 Family history of malignant neoplasm of breast: Secondary | ICD-10-CM | POA: Insufficient documentation

## 2022-09-02 DIAGNOSIS — M899 Disorder of bone, unspecified: Secondary | ICD-10-CM | POA: Diagnosis not present

## 2022-09-02 DIAGNOSIS — Z823 Family history of stroke: Secondary | ICD-10-CM | POA: Diagnosis not present

## 2022-09-02 DIAGNOSIS — C9001 Multiple myeloma in remission: Secondary | ICD-10-CM | POA: Diagnosis not present

## 2022-09-02 DIAGNOSIS — Z5112 Encounter for antineoplastic immunotherapy: Secondary | ICD-10-CM | POA: Insufficient documentation

## 2022-09-02 DIAGNOSIS — Z8261 Family history of arthritis: Secondary | ICD-10-CM | POA: Diagnosis not present

## 2022-09-02 DIAGNOSIS — Z7982 Long term (current) use of aspirin: Secondary | ICD-10-CM | POA: Diagnosis not present

## 2022-09-02 DIAGNOSIS — N1831 Chronic kidney disease, stage 3a: Secondary | ICD-10-CM

## 2022-09-02 DIAGNOSIS — M549 Dorsalgia, unspecified: Secondary | ICD-10-CM | POA: Diagnosis not present

## 2022-09-02 DIAGNOSIS — I129 Hypertensive chronic kidney disease with stage 1 through stage 4 chronic kidney disease, or unspecified chronic kidney disease: Secondary | ICD-10-CM | POA: Diagnosis not present

## 2022-09-02 DIAGNOSIS — Z5111 Encounter for antineoplastic chemotherapy: Secondary | ICD-10-CM | POA: Diagnosis not present

## 2022-09-02 DIAGNOSIS — Z9851 Tubal ligation status: Secondary | ICD-10-CM | POA: Insufficient documentation

## 2022-09-02 DIAGNOSIS — D649 Anemia, unspecified: Secondary | ICD-10-CM | POA: Diagnosis not present

## 2022-09-02 DIAGNOSIS — E7800 Pure hypercholesterolemia, unspecified: Secondary | ICD-10-CM | POA: Diagnosis not present

## 2022-09-02 DIAGNOSIS — Z8249 Family history of ischemic heart disease and other diseases of the circulatory system: Secondary | ICD-10-CM | POA: Diagnosis not present

## 2022-09-02 DIAGNOSIS — R5383 Other fatigue: Secondary | ICD-10-CM | POA: Insufficient documentation

## 2022-09-02 DIAGNOSIS — Z7961 Long term (current) use of immunomodulator: Secondary | ICD-10-CM | POA: Diagnosis not present

## 2022-09-02 DIAGNOSIS — Z79624 Long term (current) use of inhibitors of nucleotide synthesis: Secondary | ICD-10-CM | POA: Diagnosis not present

## 2022-09-02 DIAGNOSIS — Z79899 Other long term (current) drug therapy: Secondary | ICD-10-CM | POA: Diagnosis not present

## 2022-09-02 LAB — CBC WITH DIFFERENTIAL/PLATELET
Abs Immature Granulocytes: 0.01 10*3/uL (ref 0.00–0.07)
Basophils Absolute: 0.1 10*3/uL (ref 0.0–0.1)
Basophils Relative: 2 %
Eosinophils Absolute: 0.2 10*3/uL (ref 0.0–0.5)
Eosinophils Relative: 5 %
HCT: 31.4 % — ABNORMAL LOW (ref 36.0–46.0)
Hemoglobin: 10 g/dL — ABNORMAL LOW (ref 12.0–15.0)
Immature Granulocytes: 0 %
Lymphocytes Relative: 33 %
Lymphs Abs: 1.4 10*3/uL (ref 0.7–4.0)
MCH: 31.7 pg (ref 26.0–34.0)
MCHC: 31.8 g/dL (ref 30.0–36.0)
MCV: 99.7 fL (ref 80.0–100.0)
Monocytes Absolute: 0.4 10*3/uL (ref 0.1–1.0)
Monocytes Relative: 9 %
Neutro Abs: 2.1 10*3/uL (ref 1.7–7.7)
Neutrophils Relative %: 51 %
Platelets: 234 10*3/uL (ref 150–400)
RBC: 3.15 MIL/uL — ABNORMAL LOW (ref 3.87–5.11)
RDW: 14.2 % (ref 11.5–15.5)
WBC: 4.2 10*3/uL (ref 4.0–10.5)
nRBC: 0 % (ref 0.0–0.2)

## 2022-09-02 LAB — COMPREHENSIVE METABOLIC PANEL
ALT: 17 U/L (ref 0–44)
AST: 23 U/L (ref 15–41)
Albumin: 3.4 g/dL — ABNORMAL LOW (ref 3.5–5.0)
Alkaline Phosphatase: 45 U/L (ref 38–126)
Anion gap: 8 (ref 5–15)
BUN: 16 mg/dL (ref 8–23)
CO2: 26 mmol/L (ref 22–32)
Calcium: 9.4 mg/dL (ref 8.9–10.3)
Chloride: 107 mmol/L (ref 98–111)
Creatinine, Ser: 1.02 mg/dL — ABNORMAL HIGH (ref 0.44–1.00)
GFR, Estimated: 54 mL/min — ABNORMAL LOW (ref >60)
Glucose, Bld: 86 mg/dL (ref 70–99)
Potassium: 3.6 mmol/L (ref 3.5–5.1)
Sodium: 141 mmol/L (ref 135–145)
Total Bilirubin: 0.2 mg/dL — ABNORMAL LOW (ref 0.3–1.2)
Total Protein: 6.2 g/dL — ABNORMAL LOW (ref 6.5–8.1)

## 2022-09-02 LAB — TSH: TSH: 0.226 u[IU]/mL — ABNORMAL LOW (ref 0.350–4.500)

## 2022-09-02 LAB — T4, FREE: Free T4: 1.12 ng/dL (ref 0.61–1.12)

## 2022-09-02 MED ORDER — ACETAMINOPHEN 325 MG PO TABS
650.0000 mg | ORAL_TABLET | Freq: Once | ORAL | Status: AC
  Administered 2022-09-02: 650 mg via ORAL
  Filled 2022-09-02: qty 2

## 2022-09-02 MED ORDER — DIPHENHYDRAMINE HCL 25 MG PO CAPS
50.0000 mg | ORAL_CAPSULE | Freq: Once | ORAL | Status: AC
  Administered 2022-09-02: 50 mg via ORAL
  Filled 2022-09-02: qty 2

## 2022-09-02 MED ORDER — DARATUMUMAB-HYALURONIDASE-FIHJ 1800-30000 MG-UT/15ML ~~LOC~~ SOLN
1800.0000 mg | Freq: Once | SUBCUTANEOUS | Status: AC
  Administered 2022-09-02: 1800 mg via SUBCUTANEOUS
  Filled 2022-09-02: qty 15

## 2022-09-02 NOTE — Assessment & Plan Note (Signed)
Chemotherapy plan as listed as above. 

## 2022-09-02 NOTE — Patient Instructions (Signed)
[facility]  Discharge Instructions: Thank you for choosing [facility] to provide your oncology and hematology care.  If you have a lab appointment with the [facility], please go directly to the [facility] and check in at the registration area.  Wear comfortable clothing and clothing appropriate for easy access to any Portacath or PICC line.   We strive to give you quality time with your provider. You may need to reschedule your appointment if you arrive late (15 or more minutes).  Arriving late affects you and other patients whose appointments are after yours.  Also, if you miss three or more appointments without notifying the office, you may be dismissed from the clinic at the provider's discretion.      For prescription refill requests, have your pharmacy contact our office and allow 72 hours for refills to be completed.    Today you received the following chemotherapy and/or immunotherapy agents Darzalex Faspro       To help prevent nausea and vomiting after your treatment, we encourage you to take your nausea medication as directed.  BELOW ARE SYMPTOMS THAT SHOULD BE REPORTED IMMEDIATELY: *FEVER GREATER THAN 100.4 F (38 C) OR HIGHER *CHILLS OR SWEATING *NAUSEA AND VOMITING THAT IS NOT CONTROLLED WITH YOUR NAUSEA MEDICATION *UNUSUAL SHORTNESS OF BREATH *UNUSUAL BRUISING OR BLEEDING *URINARY PROBLEMS (pain or burning when urinating, or frequent urination) *BOWEL PROBLEMS (unusual diarrhea, constipation, pain near the anus) TENDERNESS IN MOUTH AND THROAT WITH OR WITHOUT PRESENCE OF ULCERS (sore throat, sores in mouth, or a toothache) UNUSUAL RASH, SWELLING OR PAIN  UNUSUAL VAGINAL DISCHARGE OR ITCHING   Items with * indicate a potential emergency and should be followed up as soon as possible or go to the Emergency Department if any problems should occur.  Please show the CHEMOTHERAPY ALERT CARD or IMMUNOTHERAPY ALERT CARD at  check-in to the Emergency Department and triage nurse.  Should you have questions after your visit or need to cancel or reschedule your appointment, please contact [facility]  [phone] and follow the prompts.  Office hours are 8:00 a.m. to 4:30 p.m. Monday - Friday. Please note that voicemails left after 4:00 p.m. may not be returned until the following business day.  We are closed weekends and major holidays. You have access to a nurse at all times for urgent questions. Please call the main number to the clinic [phone] and follow the prompts.  For any non-urgent questions, you may also contact your provider using MyChart. We now offer e-Visits for anyone 87 and older to request care online for non-urgent symptoms. For details visit [email or web address].   Also download the MyChart app! Go to the app store, search "MyChart", open the app, select [facility], and log in with your MyChart username and password.  Masks are optional in the cancer centers. If you would like for your care team to wear a mask while they are taking care of you, please let them know. For doctor visits, patients may have with them one support person who is at least 87 years old. At this time, visitors are not allowed in the infusion area.

## 2022-09-02 NOTE — Assessment & Plan Note (Signed)
Continue Q3 months Zometa- next due March 2024 Recommend calcium 1200 mg daily.

## 2022-09-02 NOTE — Progress Notes (Signed)
Hematology/Oncology Progress note Telephone:([phone] [phone]     CHIEF COMPLAINTS/REASON FOR VISIT:  Follow-up for multiple myeloma  ASSESSMENT & PLAN:   Cancer Staging  Multiple myeloma ([place]) Staging form: Plasma Cell Myeloma and Plasma Cell Disorders, AJCC 8th Edition - Clinical stage from 08/22/2021: RISS Stage II (Beta-2-microglobulin (mg/L): 4.1, Albumin (g/dL): 2.8, ISS: Stage II, High-risk cytogenetics: Absent, LDH: Normal) - Signed by Stephanie Server, MD on 09/06/2021   Multiple myeloma (HCC) Multiple myeloma, IgA kappa, complex hyperploid gain of 1q, S/p Daratumumab Rd.   Revlimid D1-21,  bone marrow biopsy results were reviewed and discussed with patient - 3% plasma cell, no M protein. She is in [employer] are reviewed and discussed with patient.  Proceed with  Daratumumab monotherapy maintenance - she takes home supply of Dexamethasone prior to her Daratumumab.  Continue asprin '81mg'$  daily and Acyclovir '400mg'$  BID.   Bone lesion Continue Q3 months Zometa- next due March 2024 Recommend calcium 1200 mg daily.  Encounter for antineoplastic chemotherapy Chemotherapy plan as listed as above.  Normocytic anemia Continue monitor.   Stage 3a chronic kidney disease ([place]) recommend patient to avoid nephrotoxin.  Encourage oral hydration.  No orders of the defined types were placed in this encounter.  Follow up  Lab/MD/Dara in 4 weeks  All questions were answered. The patient knows to call the clinic with any problems, questions or concerns.  Stephanie Server, MD, PhD [facility] Hematology Oncology 09/02/2022     HISTORY OF PRESENTING ILLNESS:   Stephanie Delacruz is a  89 y.o.  female presents for follow up of multiple myeloma.  Oncology History  Multiple myeloma ([place])  08/22/2021 Initial Diagnosis   Multiple myeloma (HCC) IgA kappa Initial M protein 5.2, free kappa light chain ratio 89.2, Kappa/lambda ratio 13.31   08/22/2021 Cancer Staging   Staging form: Plasma  Cell Myeloma and Plasma Cell Disorders, AJCC 8th Edition - Clinical stage from 08/22/2021: RISS Stage II (Beta-2-microglobulin (mg/L): 4.1, Albumin (g/dL): 2.8, ISS: Stage II, High-risk cytogenetics: Absent, LDH: Normal) - Signed by Stephanie Server, MD on 09/06/2021 Stage prefix: Initial diagnosis Beta 2 microglobulin range (mg/L): 3.5 to 5.49 Albumin range (g/dL): Less than 3.5 Cytogenetics: 1q addition   08/26/2021 - 09/01/2022 Chemotherapy   08/26/21 MYELOMA Daratumumab SQ q28d  09/06/2021 added Revlimid '10mg'$  x 14 days.  09/27/2021 starting cycle 2 Revlimid 10 mg 21 days on, 7 days off. Weekly Dexamethasone.    08/26/2021 Bone Marrow Biopsy   Bone marrow biopsy showed hypercellular marrow with plasma cell neoplasm. 67% plasma cells in the aspirate. Cytogenetics showed complex hyperplasia, gain of 1 q.    07/23/2022 Bone Marrow Biopsy   Bone marrow biopsy showed Hypercellular bone marrow for age with trilineage hematopoiesis and 3% plasma cells  Cytogenetics are normal.     INTERVAL HISTORY Stephanie Delacruz is a 89 y.o. female who has above history reviewed by me today presents for follow up visit for management of multiple myeloma She was accompanied by her daughter today.  She denies any nausea vomiting diarrhea.   No new complaints.    Review of Systems  Constitutional:  Positive for fatigue. Negative for appetite change, chills and fever.  HENT:   Negative for hearing loss and voice change.   Eyes:  Negative for eye problems.  Respiratory:  Negative for chest tightness and cough.   Cardiovascular:  Negative for chest pain.  Gastrointestinal:  Negative for abdominal distention, abdominal pain and blood in stool.  Endocrine: Negative  for hot flashes.  Genitourinary:  Negative for difficulty urinating and frequency.   Musculoskeletal:  Positive for back pain. Negative for arthralgias.  Skin:  Negative for itching and rash.  Neurological:  Negative for extremity weakness.  Hematological:   Negative for adenopathy.  Psychiatric/Behavioral:  Negative for confusion.     MEDICAL HISTORY:  Past Medical History:  Diagnosis Date   Anemia    GERD (gastroesophageal reflux disease)    Hypercholesterolemia    Hypertension    Renal insufficiency     SURGICAL HISTORY: Past Surgical History:  Procedure Laterality Date   TONSILLECTOMY     TUBAL LIGATION      SOCIAL HISTORY: Social History   Socioeconomic History   Marital status: Married    Spouse name: Not on file   Number of children: Not on file   Years of education: Not on file   Highest education level: Not on file  Occupational History   Not on file  Tobacco Use   Smoking status: Never   Smokeless tobacco: Never  Vaping Use   Vaping Use: Never used  Substance and Sexual Activity   Alcohol use: No   Drug use: No   Sexual activity: Not on file  Other Topics Concern   Not on file  Social History Narrative   Lives with husband Stephanie Delacruz and daughter Stephanie Delacruz. No pets.    Social Determinants of Health   Financial Resource Strain: Not on file  Food Insecurity: Not on file  Transportation Needs: Not on file  Physical Activity: Not on file  Stress: Not on file  Social Connections: Not on file  Intimate Partner Violence: Not on file    FAMILY HISTORY: Family History  Problem Relation Age of Onset   Hypertension Mother    Arthritis Mother    Hypertension Father    Stroke Father    Breast cancer Niece     ALLERGIES:  has No Known Allergies.  MEDICATIONS:  Current Outpatient Medications  Medication Sig Dispense Refill   acyclovir (ZOVIRAX) 400 MG tablet TAKE 1 TABLET(400 MG) BY MOUTH TWICE DAILY 60 tablet 11   amLODipine (NORVASC) 2.5 MG tablet Take 5 mg by mouth daily.     ascorbic acid (VITAMIN C) 250 MG tablet daily Take 2 tablets every day by oral route.     aspirin 81 MG EC tablet Take 81 mg by mouth daily.     Calcium Carb-Cholecalciferol (CALCIUM 600+D) 600-20 MG-MCG TABS Take 1 tablet by mouth 3  (three) times daily.     dexamethasone (DECADRON) 4 MG tablet Take 5 tablets (20 mg total) by mouth once a week. 60 tablet 0   donepezil (ARICEPT) 10 MG tablet Take 1 tablet (10 mg total) by mouth daily. 90 tablet 2   fenofibrate (TRICOR) 48 MG tablet TAKE 1 TABLET(48 MG) BY MOUTH DAILY 30 tablet 3   fexofenadine (ALLEGRA) 180 MG tablet Take by mouth.     hydrochlorothiazide (HYDRODIURIL) 25 MG tablet 1/2 tablet q day 45 tablet 1   memantine (NAMENDA) 5 MG tablet Take 5 mg by mouth 2 (two) times daily.     metoprolol succinate (TOPROL-XL) 25 MG 24 hr tablet Take 2 tablets (50 mg total) by mouth daily. 180 tablet 1   montelukast (SINGULAIR) 10 MG tablet Take 1 tablet (10 mg total) by mouth See admin instructions. Take 1 tablet one day before Daratumumab injection. 60 tablet 0   Multiple Vitamins-Minerals (WOMENS MULTIVITAMIN PO) Take 1 tablet by mouth daily.  omeprazole (PRILOSEC) 20 MG capsule TAKE 1 CAPSULE(20 MG) BY MOUTH DAILY 90 capsule 3   potassium chloride (KLOR-CON M) 10 MEQ tablet Take 1 tablet (10 mEq total) by mouth daily. 30 tablet 1   valsartan (DIOVAN) 160 MG tablet Take 1 tablet (160 mg total) by mouth daily. 90 tablet 1   lenalidomide (REVLIMID) 10 MG capsule Take 1 capsule (10 mg total) by mouth daily. Take for 21 days, then hold for 7 days. Repeat every 28 days. (Patient not taking: Reported on 09/02/2022) 21 capsule 0   No current facility-administered medications for this visit.     PHYSICAL EXAMINATION: ECOG PERFORMANCE STATUS: 1 - Symptomatic but completely ambulatory Vitals:   09/02/22 0925  BP: (!) 149/54  Pulse: (!) 58  Resp: 18  Temp: 98 F (36.7 C)   Filed Weights   09/02/22 0925  Weight: 143 lb (64.9 kg)    Physical Exam Constitutional:      General: She is not in acute distress. HENT:     Head: Normocephalic and atraumatic.  Eyes:     General: No scleral icterus. Cardiovascular:     Rate and Rhythm: Normal rate and regular rhythm.     Heart  sounds: Normal heart sounds.  Pulmonary:     Effort: Pulmonary effort is normal. No respiratory distress.     Breath sounds: No wheezing.  Abdominal:     General: Bowel sounds are normal. There is no distension.     Palpations: Abdomen is soft.  Musculoskeletal:        General: No deformity. Normal range of motion.     Cervical back: Normal range of motion and neck supple.  Skin:    General: Skin is warm and dry.     Findings: No erythema or rash.  Neurological:     Mental Status: She is alert and oriented to person, place, and time. Mental status is at baseline.     Cranial Nerves: No cranial nerve deficit.     Coordination: Coordination normal.  Psychiatric:        Mood and Affect: Mood normal.     LABORATORY DATA:  I have reviewed the data as listed    Latest Ref Rng & Units 09/02/2022    9:05 AM 08/05/2022    2:08 PM 07/23/2022    8:25 AM  CBC  WBC 4.0 - 10.5 K/uL 4.2  5.4  6.3   Hemoglobin 12.0 - 15.0 g/dL 10.0  10.1  9.8   Hematocrit 36.0 - 46.0 % 31.4  30.4  29.6   Platelets 150 - 400 K/uL 234  245  182       Latest Ref Rng & Units 08/05/2022    1:21 PM 07/08/2022    8:33 AM 06/10/2022    8:48 AM  CMP  Glucose 70 - 99 mg/dL 116  98  91   BUN 8 - 23 mg/dL '17  22  18   '$ Creatinine 0.44 - 1.00 mg/dL 0.91  0.99  1.03   Sodium 135 - 145 mmol/L 141  143  140   Potassium 3.5 - 5.1 mmol/L 4.2  3.8  3.8   Chloride 98 - 111 mmol/L 110  108  109   CO2 22 - 32 mmol/L '24  26  25   '$ Calcium 8.9 - 10.3 mg/dL 8.6  9.1  8.9   Total Protein 6.5 - 8.1 g/dL 6.2  6.2  6.1   Total Bilirubin 0.3 - 1.2 mg/dL 0.3  0.6  0.4   Alkaline Phos 38 - 126 U/L 46  39  41   AST 15 - 41 U/L '25  22  25   '$ ALT 0 - 44 U/L '20  18  18      '$ Iron/TIBC/Ferritin/ %Sat    Component Value Date/Time   IRON 54 07/24/2021 1212   TIBC 277 07/24/2021 1212   FERRITIN 93 07/24/2021 1212   IRONPCTSAT 20 07/24/2021 1212       RADIOGRAPHIC STUDIES: I have personally reviewed the radiological images as  listed and agreed with the findings in the report. US THYROID  Result Date: 08/27/2022 CLINICAL DATA:  Palpable abnormality. EXAM: THYROID ULTRASOUND TECHNIQUE: Ultrasound examination of the thyroid gland and adjacent soft tissues was performed. COMPARISON:  None Available. FINDINGS: Parenchymal Echotexture: Mildly heterogenous Isthmus: 0.9 cm Right lobe: 4.7 x 2.8 x 2.8 cm Left lobe: 4.6 x 2.2 x 2.2 cm _________________________________________________________ Estimated total number of nodules >/= 1 cm: 3 Number of spongiform nodules >/=  2 cm not described below (TR1): 0 Number of mixed cystic and solid nodules >/= 1.5 cm not described below (TR2): 0 _________________________________________________________ Nodule # 1: Approximately 1.2 cm spongiform nodule in the thyroid isthmus is considered sonographically low risk/benign. No imaging follow-up recommended. Nodule # 2: Location: Isthmus; Inferior Maximum size: 3.2 cm; Other 2 dimensions: 2.4 x 3.1 cm Composition: solid/almost completely solid (2) Echogenicity: isoechoic (1) Shape: not taller-than-wide (0) Margins: smooth (0) Echogenic foci: none (0) ACR TI-RADS total points: 3. ACR TI-RADS risk category: TR3 (3 points). ACR TI-RADS recommendations: **Given size (>/= 2.5 cm) and appearance, fine needle aspiration of this mildly suspicious nodule should be considered based on TI-RADS criteria. _________________________________________________________ Nodule # 3: Anechoic cyst with peripheral colloid artifact in the right mid gland measures up to 1.7 cm. Findings are consistent with a benign colloid nodule. Nodule # 4: Location: Left; Mid Maximum size: 2.7 cm; Other 2 dimensions: 1.5 x 2.1 cm Composition: solid/almost completely solid (2) Echogenicity: isoechoic (1) Shape: not taller-than-wide (0) Margins: ill-defined (0) Echogenic foci: none (0) ACR TI-RADS total points: 3. ACR TI-RADS risk category: TR3 (3 points). ACR TI-RADS recommendations: **Given size (>/=  2.5 cm) and appearance, fine needle aspiration of this mildly suspicious nodule should be considered based on TI-RADS criteria. _________________________________________________________ IMPRESSION: 1. Enlarged, heterogeneous and multinodular thyroid gland most consistent with multinodular goiter. 2. Large TI-RADS category 3 nodules in the thyroid isthmus (labeled # 2) and the left mid gland (labeled # 4) both meet criteria to consider fine-needle aspiration biopsy. The above is in keeping with the ACR TI-RADS recommendations - J Am Coll Radiol 2017;14:587-595. Electronically Signed   By: Jacqulynn Cadet M.D.   On: 08/27/2022 15:23   CT BONE MARROW BIOPSY & ASPIRATION  Result Date: 07/23/2022 INDICATION: Multiple myeloma EXAM: CT BONE MARROW BIOPSY AND ASPIRATION MEDICATIONS: None. ANESTHESIA/SEDATION: Moderate (conscious) sedation was employed during this procedure. A total of Versed 1 mg and Fentanyl 25 mcg was administered intravenously. Moderate Sedation Time: 10 minutes. The patient's level of consciousness and vital signs were monitored continuously by radiology nursing throughout the procedure under my direct supervision. FLUOROSCOPY TIME:  N/a COMPLICATIONS: None immediate. PROCEDURE: Informed written consent was obtained from the patient after a thorough discussion of the procedural risks, benefits and alternatives. All questions were addressed. Maximal Sterile Barrier Technique was utilized including caps, mask, sterile gowns, sterile gloves, sterile drape, hand hygiene and skin antiseptic. A timeout was performed prior to the initiation of the procedure. The patient  was placed prone on the CT exam table. Limited CT of the pelvis was performed for planning purposes. Skin entry site was marked, and the overlying skin was prepped and draped in the standard sterile fashion. Local analgesia was obtained with 1% lidocaine. Using CT guidance, an 11 gauge needle was advanced just deep to the cortex of the  right posterior ilium. Subsequently, bone marrow aspiration and core biopsy were performed. Specimens were submitted to lab/pathology for handling. Hemostasis was achieved with manual pressure, and a clean dressing was placed. The patient tolerated the procedure well without immediate complication. IMPRESSION: Successful CT-guided bone marrow aspiration and core biopsy of the right posterior ilium. Electronically Signed   By: Albin Felling M.D.   On: 07/23/2022 14:44

## 2022-09-02 NOTE — Assessment & Plan Note (Addendum)
Multiple myeloma, IgA kappa, complex hyperploid gain of 1q, S/p Daratumumab Rd.   Revlimid D1-21,  bone marrow biopsy results were reviewed and discussed with patient - 3% plasma cell, no M protein. She is in [place]  Labs are reviewed and discussed with patient.  Proceed with  Daratumumab monotherapy maintenance - she takes home supply of Dexamethasone prior to her Daratumumab.  Continue asprin 81mg daily and Acyclovir 400mg BID.  

## 2022-09-02 NOTE — Assessment & Plan Note (Signed)
Continue monitor

## 2022-09-02 NOTE — Progress Notes (Signed)
Pt here for follow up. No new concerns voiced.   

## 2022-09-02 NOTE — Assessment & Plan Note (Signed)
recommend patient to avoid nephrotoxin.  Encourage oral hydration. 

## 2022-09-03 LAB — KAPPA/LAMBDA LIGHT CHAINS
Kappa free light chain: 13.7 mg/L (ref 3.3–19.4)
Kappa, lambda light chain ratio: 1.83 — ABNORMAL HIGH (ref 0.26–1.65)
Lambda free light chains: 7.5 mg/L (ref 5.7–26.3)

## 2022-09-04 DIAGNOSIS — K080 Exfoliation of teeth due to systemic causes: Secondary | ICD-10-CM | POA: Diagnosis not present

## 2022-09-08 LAB — MULTIPLE MYELOMA PANEL, SERUM
Albumin SerPl Elph-Mcnc: 3.3 g/dL (ref 2.9–4.4)
Albumin/Glob SerPl: 1.4 (ref 0.7–1.7)
Alpha 1: 0.3 g/dL (ref 0.0–0.4)
Alpha2 Glob SerPl Elph-Mcnc: 0.7 g/dL (ref 0.4–1.0)
B-Globulin SerPl Elph-Mcnc: 1.1 g/dL (ref 0.7–1.3)
Gamma Glob SerPl Elph-Mcnc: 0.4 g/dL (ref 0.4–1.8)
Globulin, Total: 2.4 g/dL (ref 2.2–3.9)
IgA: 219 mg/dL (ref 64–422)
IgG (Immunoglobin G), Serum: 462 mg/dL — ABNORMAL LOW (ref 586–1602)
IgM (Immunoglobulin M), Srm: 11 mg/dL — ABNORMAL LOW (ref 26–217)
M Protein SerPl Elph-Mcnc: 0.1 g/dL — ABNORMAL HIGH
Total Protein ELP: 5.7 g/dL — ABNORMAL LOW (ref 6.0–8.5)

## 2022-09-14 ENCOUNTER — Other Ambulatory Visit: Payer: Self-pay | Admitting: Oncology

## 2022-09-15 NOTE — Telephone Encounter (Signed)
Component Ref Range & Units 13 d ago 1 mo ago 2 mo ago 3 mo ago 4 mo ago 5 mo ago 6 mo ago  Potassium 3.5 - 5.1 mmol/L 3.6 4.2 3.8 3.8 4.4 3.8 3.1 Low

## 2022-09-16 ENCOUNTER — Encounter: Payer: Self-pay | Admitting: Internal Medicine

## 2022-09-16 ENCOUNTER — Ambulatory Visit: Payer: Medicare Other | Admitting: Internal Medicine

## 2022-09-16 VITALS — BP 118/72 | HR 63 | Ht 63.0 in | Wt 141.0 lb

## 2022-09-16 DIAGNOSIS — E042 Nontoxic multinodular goiter: Secondary | ICD-10-CM | POA: Diagnosis not present

## 2022-09-16 DIAGNOSIS — E0590 Thyrotoxicosis, unspecified without thyrotoxic crisis or storm: Secondary | ICD-10-CM

## 2022-09-16 MED ORDER — METHIMAZOLE 5 MG PO TABS: 5.0000 mg | ORAL_TABLET | Freq: Every day | ORAL | 2 refills | Status: DC

## 2022-09-16 NOTE — Patient Instructions (Signed)
Start Methimazole 5 mg , 1 tablet daily

## 2022-09-16 NOTE — Progress Notes (Unsigned)
Name: Stephanie Delacruz  MRN/ DOB: [id number], 1933-11-28    Age/ Sex: 89 y.o., female    PCP: Einar Pheasant, MD   Reason for Endocrinology Evaluation: Multinodular goiter     Date of Initial Endocrinology Evaluation: 09/16/2022     HPI: Stephanie Delacruz is a 89 y.o. female with a past medical history of multiple myeloma, and dyslipidemia. The patient presented for initial endocrinology clinic visit on 09/16/2022 for consultative assistance with her multinodular goiter  Pt has been noted with multinodular goiter on ultrasound 07/2022 with two nodules meeting FNA criteria.  Patient on chemotherapy   She is accompanied by her daughter Levada Dy   She had weight loss during MM treatment  No local neck symptoms  Denies palpitations  Has tremors  Has diarrhea and loose stools  for unknown period of time  No biotin intake  No fh of thyroid disease      HISTORY:  Past Medical History:  Past Medical History:  Diagnosis Date   Anemia    GERD (gastroesophageal reflux disease)    Hypercholesterolemia    Hypertension    Renal insufficiency    Past Surgical History:  Past Surgical History:  Procedure Laterality Date   TONSILLECTOMY     TUBAL LIGATION      Social History:  reports that she has never smoked. She has never used smokeless tobacco. She reports that she does not drink alcohol and does not use drugs. Family History: family history includes Arthritis in her mother; Breast cancer in her niece; Hypertension in her father and mother; Stroke in her father.   HOME MEDICATIONS: Allergies as of 09/16/2022   No Known Allergies      Medication List        Accurate as of September 16, 2022  1:27 PM. If you have any questions, ask your nurse or doctor.          acyclovir 400 MG tablet Commonly known as: ZOVIRAX TAKE 1 TABLET(400 MG) BY MOUTH TWICE DAILY   amLODipine 2.5 MG tablet Commonly known as: NORVASC Take 5 mg by mouth daily.   ascorbic acid 250 MG  tablet Commonly known as: VITAMIN C daily Take 2 tablets every day by oral route.   aspirin EC 81 MG tablet Take 81 mg by mouth daily.   Calcium 600+D 600-20 MG-MCG Tabs Generic drug: Calcium Carb-Cholecalciferol Take 1 tablet by mouth 3 (three) times daily.   dexamethasone 4 MG tablet Commonly known as: DECADRON Take 5 tablets (20 mg total) by mouth once a week.   donepezil 10 MG tablet Commonly known as: ARICEPT Take 1 tablet (10 mg total) by mouth daily.   fenofibrate 48 MG tablet Commonly known as: TRICOR TAKE 1 TABLET(48 MG) BY MOUTH DAILY   fexofenadine 180 MG tablet Commonly known as: ALLEGRA Take by mouth.   hydrochlorothiazide 25 MG tablet Commonly known as: HYDRODIURIL 1/2 tablet q day   lenalidomide 10 MG capsule Commonly known as: REVLIMID Take 1 capsule (10 mg total) by mouth daily. Take for 21 days, then hold for 7 days. Repeat every 28 days.   memantine 5 MG tablet Commonly known as: NAMENDA Take 5 mg by mouth 2 (two) times daily.   metoprolol succinate 25 MG 24 hr tablet Commonly known as: TOPROL-XL Take 2 tablets (50 mg total) by mouth daily.   montelukast 10 MG tablet Commonly known as: Singulair Take 1 tablet (10 mg total) by mouth See admin instructions. Take 1 tablet  one day before Daratumumab injection.   omeprazole 20 MG capsule Commonly known as: PRILOSEC TAKE 1 CAPSULE(20 MG) BY MOUTH DAILY   prochlorperazine 10 MG tablet Commonly known as: COMPAZINE Take 10 mg by mouth every 6 (six) hours as needed for nausea or vomiting.   valsartan 160 MG tablet Commonly known as: DIOVAN Take 1 tablet (160 mg total) by mouth daily.   WOMENS MULTIVITAMIN PO Take 1 tablet by mouth daily.          REVIEW OF SYSTEMS: A comprehensive ROS was conducted with the patient and is negative except as per HPI and below:  ROS     OBJECTIVE:  VS: BP 118/72 (BP Location: Left Arm, Patient Position: Sitting, Cuff Size: Small)   Pulse 63   Ht 5' 3"$   (1.6 m)   Wt 141 lb (64 kg)   SpO2 99%   BMI 24.98 kg/m    Wt Readings from Last 3 Encounters:  09/16/22 141 lb (64 kg)  09/02/22 143 lb (64.9 kg)  08/15/22 139 lb (63 kg)     EXAM: General: Pt appears well and is in NAD  Eyes: External eye exam normal without stare, lid lag or exophthalmos.  EOM intact.  PERRL.  Neck: General: Supple without adenopathy. Thyroid: Thyroid size normal.  No goiter or nodules appreciated. No thyroid bruit.  Lungs: Clear with good BS bilat with no rales, rhonchi, or wheezes  Heart: Auscultation: RRR.  Abdomen: Normoactive bowel sounds, soft, nontender, without masses or organomegaly palpable  Extremities:  BL LE: No pretibial edema normal ROM and strength.  Mental Status: Judgment, insight: Intact Orientation: Oriented to time, place, and person Mood and affect: No depression, anxiety, or agitation     DATA REVIEWED: *** Thyroid ultrasound 08/27/2022  Estimated total number of nodules >/= 1 cm: 3   Number of spongiform nodules >/=  2 cm not described below (TR1): 0   Number of mixed cystic and solid nodules >/= 1.5 cm not described below ([place]): 0   _________________________________________________________   Nodule # 1: Approximately 1.2 cm spongiform nodule in the thyroid isthmus is considered sonographically low risk/benign. No imaging follow-up recommended.   Nodule # 2:   Location: Isthmus; Inferior   Maximum size: 3.2 cm; Other 2 dimensions: 2.4 x 3.1 cm   Composition: solid/almost completely solid (2)   Echogenicity: isoechoic (1)   Shape: not taller-than-wide (0)   Margins: smooth (0)   Echogenic foci: none (0)   ACR TI-RADS total points: 3.   ACR TI-RADS risk category: TR3 (3 points).   ACR TI-RADS recommendations:   **Given size (>/= 2.5 cm) and appearance, fine needle aspiration of this mildly suspicious nodule should be considered based on TI-RADS criteria.    _________________________________________________________   Nodule # 3: Anechoic cyst with peripheral colloid artifact in the right mid gland measures up to 1.7 cm. Findings are consistent with a benign colloid nodule.   Nodule # 4:   Location: Left; Mid   Maximum size: 2.7 cm; Other 2 dimensions: 1.5 x 2.1 cm   Composition: solid/almost completely solid (2)   Echogenicity: isoechoic (1)   Shape: not taller-than-wide (0)   Margins: ill-defined (0)   Echogenic foci: none (0)   ACR TI-RADS total points: 3.   ACR TI-RADS risk category: TR3 (3 points).   ACR TI-RADS recommendations:   **Given size (>/= 2.5 cm) and appearance, fine needle aspiration of this mildly suspicious nodule should be considered based on TI-RADS criteria.  _________________________________________________________   IMPRESSION: 1. Enlarged, heterogeneous and multinodular thyroid gland most consistent with multinodular goiter. 2. Large TI-RADS category 3 nodules in the thyroid isthmus (labeled # 2) and the left mid gland (labeled # 4) both meet criteria to consider fine-needle aspiration biopsy.  ASSESSMENT/PLAN/RECOMMENDATIONS:   Multinodular goiter    Medications :   2. Subclinical Hyperthyroidism:     The causes of subclinical hyperthyroidism are the same as the causes of overt hyperthyroidism, and like overt hyperthyroidism, subclinical hyperthyroidism can be persistent or transient. Common causes of subclinical hyperthyroidism include autonomously functioning thyroid adenomas and multinodular goiters (endogenous subclinical hyperthyroidism), or Graves' disease (endogenous subclinical hyperthyroidism).   Most patients with subclinical hyperthyroidism have no clinical manifestations of hyperthyroidism, and those symptoms that are present (eg, tachycardia, tremor, dyspnea on exertion, weight loss) are mild and nonspecific." However, subclinical hyperthyroidism is associated with an  increased risk of atrial fibrillation and, primarily in postmenopausal women, a decrease in bone mineral density.   Signed electronically by: Mack Guise, MD  [facility] Endocrinology  [facility] [address] [place],  [address] Phone: [phone] FAX: [phone]   CC: Einar Pheasant, [place] [id number] [place] [address] Phone: [phone] Fax: [phone]   Return to Endocrinology clinic as below: Future Appointments  Date Time Provider [place]  09/30/2022  8:45 AM [facility] None  09/30/2022  9:00 AM Earlie Server, MD [facility] None  09/30/2022  9:15 AM [facility] INFUSION CHAIR 18 [facility] None  10/28/2022  8:45 AM [facility] None  10/28/2022  9:00 AM Earlie Server, MD [facility] None  10/28/2022  9:15 AM [facility] INFUSION CHAIR 10 [facility] None  11/20/2022 10:30 AM Einar Pheasant, MD [id number]  12/12/2022 10:00 AM Kris Hartmann, NP [facility] None

## 2022-09-17 ENCOUNTER — Other Ambulatory Visit: Payer: Self-pay

## 2022-09-17 ENCOUNTER — Other Ambulatory Visit: Payer: Self-pay | Admitting: Internal Medicine

## 2022-09-17 ENCOUNTER — Encounter: Payer: Self-pay | Admitting: Internal Medicine

## 2022-09-17 DIAGNOSIS — E042 Nontoxic multinodular goiter: Secondary | ICD-10-CM | POA: Insufficient documentation

## 2022-09-18 ENCOUNTER — Other Ambulatory Visit: Payer: Self-pay | Admitting: Internal Medicine

## 2022-09-24 DIAGNOSIS — H2513 Age-related nuclear cataract, bilateral: Secondary | ICD-10-CM | POA: Diagnosis not present

## 2022-09-30 ENCOUNTER — Encounter: Payer: Self-pay | Admitting: Oncology

## 2022-09-30 ENCOUNTER — Inpatient Hospital Stay: Payer: Medicare Other | Attending: Oncology

## 2022-09-30 ENCOUNTER — Inpatient Hospital Stay (HOSPITAL_BASED_OUTPATIENT_CLINIC_OR_DEPARTMENT_OTHER): Payer: Medicare Other | Admitting: Oncology

## 2022-09-30 ENCOUNTER — Inpatient Hospital Stay: Payer: Medicare Other

## 2022-09-30 VITALS — BP 166/57 | HR 64 | Temp 98.0°F | Resp 18 | Wt 141.7 lb

## 2022-09-30 DIAGNOSIS — Z5112 Encounter for antineoplastic immunotherapy: Secondary | ICD-10-CM | POA: Diagnosis not present

## 2022-09-30 DIAGNOSIS — E7800 Pure hypercholesterolemia, unspecified: Secondary | ICD-10-CM | POA: Diagnosis not present

## 2022-09-30 DIAGNOSIS — Z79899 Other long term (current) drug therapy: Secondary | ICD-10-CM | POA: Diagnosis not present

## 2022-09-30 DIAGNOSIS — Z7961 Long term (current) use of immunomodulator: Secondary | ICD-10-CM | POA: Diagnosis not present

## 2022-09-30 DIAGNOSIS — D649 Anemia, unspecified: Secondary | ICD-10-CM | POA: Diagnosis not present

## 2022-09-30 DIAGNOSIS — M549 Dorsalgia, unspecified: Secondary | ICD-10-CM | POA: Insufficient documentation

## 2022-09-30 DIAGNOSIS — M899 Disorder of bone, unspecified: Secondary | ICD-10-CM

## 2022-09-30 DIAGNOSIS — N1831 Chronic kidney disease, stage 3a: Secondary | ICD-10-CM

## 2022-09-30 DIAGNOSIS — Z7982 Long term (current) use of aspirin: Secondary | ICD-10-CM | POA: Insufficient documentation

## 2022-09-30 DIAGNOSIS — Z8249 Family history of ischemic heart disease and other diseases of the circulatory system: Secondary | ICD-10-CM | POA: Diagnosis not present

## 2022-09-30 DIAGNOSIS — Z803 Family history of malignant neoplasm of breast: Secondary | ICD-10-CM | POA: Diagnosis not present

## 2022-09-30 DIAGNOSIS — Z823 Family history of stroke: Secondary | ICD-10-CM | POA: Insufficient documentation

## 2022-09-30 DIAGNOSIS — R5383 Other fatigue: Secondary | ICD-10-CM | POA: Diagnosis not present

## 2022-09-30 DIAGNOSIS — C9000 Multiple myeloma not having achieved remission: Secondary | ICD-10-CM

## 2022-09-30 DIAGNOSIS — I129 Hypertensive chronic kidney disease with stage 1 through stage 4 chronic kidney disease, or unspecified chronic kidney disease: Secondary | ICD-10-CM | POA: Diagnosis not present

## 2022-09-30 DIAGNOSIS — Z8261 Family history of arthritis: Secondary | ICD-10-CM | POA: Diagnosis not present

## 2022-09-30 DIAGNOSIS — C9001 Multiple myeloma in remission: Secondary | ICD-10-CM | POA: Diagnosis not present

## 2022-09-30 DIAGNOSIS — Z79620 Long term (current) use of immunosuppressive biologic: Secondary | ICD-10-CM | POA: Diagnosis not present

## 2022-09-30 DIAGNOSIS — Z5111 Encounter for antineoplastic chemotherapy: Secondary | ICD-10-CM | POA: Diagnosis not present

## 2022-09-30 LAB — COMPREHENSIVE METABOLIC PANEL
ALT: 17 U/L (ref 0–44)
AST: 28 U/L (ref 15–41)
Albumin: 3.6 g/dL (ref 3.5–5.0)
Alkaline Phosphatase: 46 U/L (ref 38–126)
Anion gap: 10 (ref 5–15)
BUN: 15 mg/dL (ref 8–23)
CO2: 25 mmol/L (ref 22–32)
Calcium: 9.1 mg/dL (ref 8.9–10.3)
Chloride: 108 mmol/L (ref 98–111)
Creatinine, Ser: 1.01 mg/dL — ABNORMAL HIGH (ref 0.44–1.00)
GFR, Estimated: 54 mL/min — ABNORMAL LOW (ref >60)
Glucose, Bld: 86 mg/dL (ref 70–99)
Potassium: 3.7 mmol/L (ref 3.5–5.1)
Sodium: 143 mmol/L (ref 135–145)
Total Bilirubin: 0.4 mg/dL (ref 0.3–1.2)
Total Protein: 6.3 g/dL — ABNORMAL LOW (ref 6.5–8.1)

## 2022-09-30 LAB — CBC WITH DIFFERENTIAL/PLATELET
Abs Immature Granulocytes: 0.01 10*3/uL (ref 0.00–0.07)
Basophils Absolute: 0 10*3/uL (ref 0.0–0.1)
Basophils Relative: 1 %
Eosinophils Absolute: 0.1 10*3/uL (ref 0.0–0.5)
Eosinophils Relative: 2 %
HCT: 31.2 % — ABNORMAL LOW (ref 36.0–46.0)
Hemoglobin: 10.2 g/dL — ABNORMAL LOW (ref 12.0–15.0)
Immature Granulocytes: 0 %
Lymphocytes Relative: 38 %
Lymphs Abs: 1.7 10*3/uL (ref 0.7–4.0)
MCH: 32.5 pg (ref 26.0–34.0)
MCHC: 32.7 g/dL (ref 30.0–36.0)
MCV: 99.4 fL (ref 80.0–100.0)
Monocytes Absolute: 0.2 10*3/uL (ref 0.1–1.0)
Monocytes Relative: 5 %
Neutro Abs: 2.5 10*3/uL (ref 1.7–7.7)
Neutrophils Relative %: 54 %
Platelets: 178 10*3/uL (ref 150–400)
RBC: 3.14 MIL/uL — ABNORMAL LOW (ref 3.87–5.11)
RDW: 14 % (ref 11.5–15.5)
WBC: 4.6 10*3/uL (ref 4.0–10.5)
nRBC: 0 % (ref 0.0–0.2)

## 2022-09-30 MED ORDER — DIPHENHYDRAMINE HCL 25 MG PO CAPS
50.0000 mg | ORAL_CAPSULE | Freq: Once | ORAL | Status: AC
  Administered 2022-09-30: 50 mg via ORAL
  Filled 2022-09-30: qty 2

## 2022-09-30 MED ORDER — ACETAMINOPHEN 325 MG PO TABS
650.0000 mg | ORAL_TABLET | Freq: Once | ORAL | Status: AC
  Administered 2022-09-30: 650 mg via ORAL
  Filled 2022-09-30: qty 2

## 2022-09-30 MED ORDER — DARATUMUMAB-HYALURONIDASE-FIHJ 1800-30000 MG-UT/15ML ~~LOC~~ SOLN
1800.0000 mg | Freq: Once | SUBCUTANEOUS | Status: AC
  Administered 2022-09-30: 1800 mg via SUBCUTANEOUS
  Filled 2022-09-30: qty 15

## 2022-09-30 NOTE — Assessment & Plan Note (Addendum)
Continue Q3 months Zometa- next due April  2024 Recommend calcium 1200 mg daily.

## 2022-09-30 NOTE — Assessment & Plan Note (Signed)
Chemotherapy plan as listed as above.

## 2022-09-30 NOTE — Patient Instructions (Signed)
[facility]  Discharge Instructions: Thank you for choosing [facility] to provide your oncology and hematology care.  If you have a lab appointment with the [facility], please go directly to the [facility] and check in at the registration area.  Wear comfortable clothing and clothing appropriate for easy access to any Portacath or PICC line.   We strive to give you quality time with your provider. You may need to reschedule your appointment if you arrive late (15 or more minutes).  Arriving late affects you and other patients whose appointments are after yours.  Also, if you miss three or more appointments without notifying the office, you may be dismissed from the clinic at the provider's discretion.      For prescription refill requests, have your pharmacy contact our office and allow 72 hours for refills to be completed.    Today you received the following chemotherapy and/or immunotherapy agents- darzalex faspro      To help prevent nausea and vomiting after your treatment, we encourage you to take your nausea medication as directed.  BELOW ARE SYMPTOMS THAT SHOULD BE REPORTED IMMEDIATELY: *FEVER GREATER THAN 100.4 F (38 C) OR HIGHER *CHILLS OR SWEATING *NAUSEA AND VOMITING THAT IS NOT CONTROLLED WITH YOUR NAUSEA MEDICATION *UNUSUAL SHORTNESS OF BREATH *UNUSUAL BRUISING OR BLEEDING *URINARY PROBLEMS (pain or burning when urinating, or frequent urination) *BOWEL PROBLEMS (unusual diarrhea, constipation, pain near the anus) TENDERNESS IN MOUTH AND THROAT WITH OR WITHOUT PRESENCE OF ULCERS (sore throat, sores in mouth, or a toothache) UNUSUAL RASH, SWELLING OR PAIN  UNUSUAL VAGINAL DISCHARGE OR ITCHING   Items with * indicate a potential emergency and should be followed up as soon as possible or go to the Emergency Department if any problems should occur.  Please show the CHEMOTHERAPY ALERT CARD or IMMUNOTHERAPY ALERT CARD at  check-in to the Emergency Department and triage nurse.  Should you have questions after your visit or need to cancel or reschedule your appointment, please contact [facility]  [phone] and follow the prompts.  Office hours are 8:00 a.m. to 4:30 p.m. Monday - Friday. Please note that voicemails left after 4:00 p.m. may not be returned until the following business day.  We are closed weekends and major holidays. You have access to a nurse at all times for urgent questions. Please call the main number to the clinic [phone] and follow the prompts.  For any non-urgent questions, you may also contact your provider using MyChart. We now offer e-Visits for anyone 47 and older to request care online for non-urgent symptoms. For details visit [email or web address].   Also download the MyChart app! Go to the app store, search "MyChart", open the app, select [facility], and log in with your MyChart username and password.

## 2022-09-30 NOTE — Assessment & Plan Note (Signed)
Multiple myeloma, IgA kappa, complex hyperploid gain of 1q, S/p Daratumumab Rd.   Revlimid D1-21,  bone marrow biopsy results were reviewed and discussed with patient - 3% plasma cell, no M protein. She is in [facility] [employer] are reviewed and discussed with patient.  Proceed with  Daratumumab monotherapy maintenance - she takes home supply of Dexamethasone prior to her Daratumumab.  Continue asprin '81mg'$  daily and Acyclovir '400mg'$  BID.

## 2022-09-30 NOTE — Progress Notes (Signed)
Hematology/Oncology Progress note [phone] [phone]     CHIEF COMPLAINTS/REASON FOR VISIT:  Follow-up for multiple myeloma  ASSESSMENT & PLAN:   Cancer Staging  Multiple myeloma ([place]) Staging form: Plasma Cell Myeloma and Plasma Cell Disorders, AJCC 8th Edition - Clinical stage from 08/22/2021: RISS Stage II (Beta-2-microglobulin (mg/L): 4.1, Albumin (g/dL): 2.8, ISS: Stage II, High-risk cytogenetics: Absent, LDH: Normal) - Signed by Earlie Server, MD on 09/06/2021   Multiple myeloma (HCC) Multiple myeloma, IgA kappa, complex hyperploid gain of 1q, S/p Daratumumab Rd.   Revlimid D1-21,  bone marrow biopsy results were reviewed and discussed with patient - 3% plasma cell, no M protein. She is in [facility] are reviewed and discussed with patient.  Proceed with  Daratumumab monotherapy maintenance - she takes home supply of Dexamethasone prior to her Daratumumab.  Continue asprin '81mg'$  daily and Acyclovir '400mg'$  BID.   Encounter for antineoplastic chemotherapy Chemotherapy plan as listed as above.  Bone lesion Continue Q3 months Zometa- next due April  2024 Recommend calcium 1200 mg daily.  Stage 3a chronic kidney disease ([place]) recommend patient to avoid nephrotoxin.  Encourage oral hydration.  Normocytic anemia Continue monitor.   Orders Placed This Encounter  Procedures   Kappa/lambda light chains    Standing Status:   Future    Standing Expiration Date:   11/25/2023   Multiple Myeloma Panel (SPEP&IFE w/QIG)    Standing Status:   Future    Standing Expiration Date:   11/25/2023   Comprehensive metabolic panel    Standing Status:   Future    Standing Expiration Date:   11/25/2023   CBC with Differential    Standing Status:   Future    Standing Expiration Date:   11/25/2023   Kappa/lambda light chains    Standing Status:   Future    Standing Expiration Date:   12/23/2023   Multiple Myeloma Panel (SPEP&IFE w/QIG)    Standing Status:   Future    Standing  Expiration Date:   12/23/2023   Comprehensive metabolic panel    Standing Status:   Future    Standing Expiration Date:   12/23/2023   CBC with Differential    Standing Status:   Future    Standing Expiration Date:   12/23/2023    Follow up  Lab/MD/Dara in 4 weeks  All questions were answered. The patient knows to call the clinic with any problems, questions or concerns.  Earlie Server, MD, PhD [facility] Hematology Oncology 09/30/2022     HISTORY OF PRESENTING ILLNESS:   Stephanie Delacruz is a  89 y.o.  female presents for follow up of multiple myeloma.  Oncology History  Multiple myeloma ([place])  08/22/2021 Initial Diagnosis   Multiple myeloma (HCC) IgA kappa Initial M protein 5.2, free kappa light chain ratio 89.2, Kappa/lambda ratio 13.31   08/22/2021 Cancer Staging   Staging form: Plasma Cell Myeloma and Plasma Cell Disorders, AJCC 8th Edition - Clinical stage from 08/22/2021: RISS Stage II (Beta-2-microglobulin (mg/L): 4.1, Albumin (g/dL): 2.8, ISS: Stage II, High-risk cytogenetics: Absent, LDH: Normal) - Signed by Earlie Server, MD on 09/06/2021 Stage prefix: Initial diagnosis Beta 2 microglobulin range (mg/L): 3.5 to 5.49 Albumin range (g/dL): Less than 3.5 Cytogenetics: 1q addition   08/26/2021 - 09/01/2022 Chemotherapy   08/26/21 MYELOMA Daratumumab SQ q28d  09/06/2021 added Revlimid '10mg'$  x 14 days.  09/27/2021 starting cycle 2 Revlimid 10 mg 21 days on, 7 days off. Weekly Dexamethasone.    08/26/2021 Bone Marrow  Biopsy   Bone marrow biopsy showed hypercellular marrow with plasma cell neoplasm. 67% plasma cells in the aspirate. Cytogenetics showed complex hyperplasia, gain of 1 q.    07/23/2022 Bone Marrow Biopsy   Bone marrow biopsy showed Hypercellular bone marrow for age with trilineage hematopoiesis and 3% plasma cells  Cytogenetics are normal.     INTERVAL HISTORY Stephanie Delacruz is a 89 y.o. female who has above history reviewed by me today presents for follow up visit for  management of multiple myeloma She was accompanied by her daughter today.  She denies any nausea vomiting diarrhea.   No new complaints.    Review of Systems  Constitutional:  Positive for fatigue. Negative for appetite change, chills and fever.  HENT:   Negative for hearing loss and voice change.   Eyes:  Negative for eye problems.  Respiratory:  Negative for chest tightness and cough.   Cardiovascular:  Negative for chest pain.  Gastrointestinal:  Negative for abdominal distention, abdominal pain and blood in stool.  Endocrine: Negative for hot flashes.  Genitourinary:  Negative for difficulty urinating and frequency.   Musculoskeletal:  Positive for back pain. Negative for arthralgias.  Skin:  Negative for itching and rash.  Neurological:  Negative for extremity weakness.  Hematological:  Negative for adenopathy.  Psychiatric/Behavioral:  Negative for confusion.     MEDICAL HISTORY:  Past Medical History:  Diagnosis Date   Anemia    GERD (gastroesophageal reflux disease)    Hypercholesterolemia    Hypertension    Renal insufficiency     SURGICAL HISTORY: Past Surgical History:  Procedure Laterality Date   TONSILLECTOMY     TUBAL LIGATION      SOCIAL HISTORY: Social History   Socioeconomic History   Marital status: Married    Spouse name: Not on file   Number of children: Not on file   Years of education: Not on file   Highest education level: Not on file  Occupational History   Not on file  Tobacco Use   Smoking status: Never   Smokeless tobacco: Never  Vaping Use   Vaping Use: Never used  Substance and Sexual Activity   Alcohol use: No   Drug use: No   Sexual activity: Not on file  Other Topics Concern   Not on file  Social History Narrative   Lives with husband Stephanie Delacruz and daughter Stephanie Delacruz. No pets.    Social Determinants of Health   Financial Resource Strain: Not on file  Food Insecurity: Not on file  Transportation Needs: Not on file  Physical  Activity: Not on file  Stress: Not on file  Social Connections: Not on file  Intimate Partner Violence: Not on file    FAMILY HISTORY: Family History  Problem Relation Age of Onset   Hypertension Mother    Arthritis Mother    Hypertension Father    Stroke Father    Breast cancer Niece     ALLERGIES:  has No Known Allergies.  MEDICATIONS:  Current Outpatient Medications  Medication Sig Dispense Refill   acyclovir (ZOVIRAX) 400 MG tablet TAKE 1 TABLET(400 MG) BY MOUTH TWICE DAILY 60 tablet 11   amLODipine (NORVASC) 2.5 MG tablet Take 5 mg by mouth daily.     ascorbic acid (VITAMIN C) 250 MG tablet daily Take 2 tablets every day by oral route.     aspirin 81 MG EC tablet Take 81 mg by mouth daily.     Calcium Carb-Cholecalciferol (CALCIUM 600+D) 600-20 MG-MCG  TABS Take 1 tablet by mouth 3 (three) times daily.     dexamethasone (DECADRON) 4 MG tablet Take 5 tablets (20 mg total) by mouth once a week. 60 tablet 0   donepezil (ARICEPT) 10 MG tablet Take 1 tablet (10 mg total) by mouth daily. 90 tablet 2   fenofibrate (TRICOR) 48 MG tablet TAKE 1 TABLET(48 MG) BY MOUTH DAILY 30 tablet 3   fexofenadine (ALLEGRA) 180 MG tablet Take by mouth.     hydrochlorothiazide (HYDRODIURIL) 25 MG tablet TAKE 1/2 TABLET BY MOUTH EVERY DAY 45 tablet 1   memantine (NAMENDA) 5 MG tablet Take 5 mg by mouth 2 (two) times daily.     methimazole (TAPAZOLE) 5 MG tablet Take 1 tablet (5 mg total) by mouth daily. 90 tablet 2   metoprolol succinate (TOPROL-XL) 25 MG 24 hr tablet TAKE 2 TABLETS(50 MG) BY MOUTH DAILY 180 tablet 1   montelukast (SINGULAIR) 10 MG tablet Take 1 tablet (10 mg total) by mouth See admin instructions. Take 1 tablet one day before Daratumumab injection. 60 tablet 0   Multiple Vitamins-Minerals (WOMENS MULTIVITAMIN PO) Take 1 tablet by mouth daily.     omeprazole (PRILOSEC) 20 MG capsule TAKE 1 CAPSULE(20 MG) BY MOUTH DAILY 90 capsule 3   prochlorperazine (COMPAZINE) 10 MG tablet Take 10  mg by mouth every 6 (six) hours as needed for nausea or vomiting.     valsartan (DIOVAN) 160 MG tablet Take 1 tablet (160 mg total) by mouth daily. 90 tablet 1   lenalidomide (REVLIMID) 10 MG capsule Take 1 capsule (10 mg total) by mouth daily. Take for 21 days, then hold for 7 days. Repeat every 28 days. (Patient not taking: Reported on 09/16/2022) 21 capsule 0   No current facility-administered medications for this visit.     PHYSICAL EXAMINATION: ECOG PERFORMANCE STATUS: 1 - Symptomatic but completely ambulatory Vitals:   09/30/22 0851  BP: (!) 166/57  Pulse: 64  Resp: 18  Temp: 98 F (36.7 C)  SpO2: 100%   Filed Weights   09/30/22 0851  Weight: 141 lb 11.2 oz (64.3 kg)    Physical Exam Constitutional:      General: She is not in acute distress. HENT:     Head: Normocephalic and atraumatic.  Eyes:     General: No scleral icterus. Cardiovascular:     Rate and Rhythm: Normal rate and regular rhythm.     Heart sounds: Normal heart sounds.  Pulmonary:     Effort: Pulmonary effort is normal. No respiratory distress.     Breath sounds: No wheezing.  Abdominal:     General: Bowel sounds are normal. There is no distension.     Palpations: Abdomen is soft.  Musculoskeletal:        General: No deformity. Normal range of motion.     Cervical back: Normal range of motion and neck supple.  Skin:    General: Skin is warm and dry.     Findings: No erythema or rash.  Neurological:     Mental Status: She is alert and oriented to person, place, and time. Mental status is at baseline.     Cranial Nerves: No cranial nerve deficit.     Coordination: Coordination normal.  Psychiatric:        Mood and Affect: Mood normal.     LABORATORY DATA:  I have reviewed the data as listed    Latest Ref Rng & Units 09/30/2022    8:39 AM 09/02/2022  9:05 AM 08/05/2022    2:08 PM  CBC  WBC 4.0 - 10.5 K/uL 4.6  4.2  5.4   Hemoglobin 12.0 - 15.0 g/dL 10.2  10.0  10.1   Hematocrit 36.0 - 46.0  % 31.2  31.4  30.4   Platelets 150 - 400 K/uL 178  234  245       Latest Ref Rng & Units 09/30/2022    8:39 AM 09/02/2022    9:05 AM 08/05/2022    1:21 PM  CMP  Glucose 70 - 99 mg/dL 86  86  116   BUN 8 - 23 mg/dL '15  16  17   '$ Creatinine 0.44 - 1.00 mg/dL 1.01  1.02  0.91   Sodium 135 - 145 mmol/L 143  141  141   Potassium 3.5 - 5.1 mmol/L 3.7  3.6  4.2   Chloride 98 - 111 mmol/L 108  107  110   CO2 22 - 32 mmol/L '25  26  24   '$ Calcium 8.9 - 10.3 mg/dL 9.1  9.4  8.6   Total Protein 6.5 - 8.1 g/dL 6.3  6.2  6.2   Total Bilirubin 0.3 - 1.2 mg/dL 0.4  0.2  0.3   Alkaline Phos 38 - 126 U/L 46  45  46   AST 15 - 41 U/L '28  23  25   '$ ALT 0 - 44 U/L '17  17  20      '$ Iron/TIBC/Ferritin/ %Sat    Component Value Date/Time   IRON 54 07/24/2021 1212   TIBC 277 07/24/2021 1212   FERRITIN 93 07/24/2021 1212   IRONPCTSAT 20 07/24/2021 1212       RADIOGRAPHIC STUDIES: I have personally reviewed the radiological images as listed and agreed with the findings in the report. US THYROID  Result Date: 08/27/2022 CLINICAL DATA:  Palpable abnormality. EXAM: THYROID ULTRASOUND TECHNIQUE: Ultrasound examination of the thyroid gland and adjacent soft tissues was performed. COMPARISON:  None Available. FINDINGS: Parenchymal Echotexture: Mildly heterogenous Isthmus: 0.9 cm Right lobe: 4.7 x 2.8 x 2.8 cm Left lobe: 4.6 x 2.2 x 2.2 cm _________________________________________________________ Estimated total number of nodules >/= 1 cm: 3 Number of spongiform nodules >/=  2 cm not described below (TR1): 0 Number of mixed cystic and solid nodules >/= 1.5 cm not described below (TR2): 0 _________________________________________________________ Nodule # 1: Approximately 1.2 cm spongiform nodule in the thyroid isthmus is considered sonographically low risk/benign. No imaging follow-up recommended. Nodule # 2: Location: Isthmus; Inferior Maximum size: 3.2 cm; Other 2 dimensions: 2.4 x 3.1 cm Composition: solid/almost  completely solid (2) Echogenicity: isoechoic (1) Shape: not taller-than-wide (0) Margins: smooth (0) Echogenic foci: none (0) ACR TI-RADS total points: 3. ACR TI-RADS risk category: TR3 (3 points). ACR TI-RADS recommendations: **Given size (>/= 2.5 cm) and appearance, fine needle aspiration of this mildly suspicious nodule should be considered based on TI-RADS criteria. _________________________________________________________ Nodule # 3: Anechoic cyst with peripheral colloid artifact in the right mid gland measures up to 1.7 cm. Findings are consistent with a benign colloid nodule. Nodule # 4: Location: Left; Mid Maximum size: 2.7 cm; Other 2 dimensions: 1.5 x 2.1 cm Composition: solid/almost completely solid (2) Echogenicity: isoechoic (1) Shape: not taller-than-wide (0) Margins: ill-defined (0) Echogenic foci: none (0) ACR TI-RADS total points: 3. ACR TI-RADS risk category: TR3 (3 points). ACR TI-RADS recommendations: **Given size (>/= 2.5 cm) and appearance, fine needle aspiration of this mildly suspicious nodule should be considered based on TI-RADS criteria. _________________________________________________________  IMPRESSION: 1. Enlarged, heterogeneous and multinodular thyroid gland most consistent with multinodular goiter. 2. Large TI-RADS category 3 nodules in the thyroid isthmus (labeled # 2) and the left mid gland (labeled # 4) both meet criteria to consider fine-needle aspiration biopsy. The above is in keeping with the ACR TI-RADS recommendations - J Am Coll Radiol 2017;14:587-595. Electronically Signed   By: Jacqulynn Cadet M.D.   On: 08/27/2022 15:23   CT BONE MARROW BIOPSY & ASPIRATION  Result Date: 07/23/2022 INDICATION: Multiple myeloma EXAM: CT BONE MARROW BIOPSY AND ASPIRATION MEDICATIONS: None. ANESTHESIA/SEDATION: Moderate (conscious) sedation was employed during this procedure. A total of Versed 1 mg and Fentanyl 25 mcg was administered intravenously. Moderate Sedation Time: 10 minutes.  The patient's level of consciousness and vital signs were monitored continuously by radiology nursing throughout the procedure under my direct supervision. FLUOROSCOPY TIME:  N/a COMPLICATIONS: None immediate. PROCEDURE: Informed written consent was obtained from the patient after a thorough discussion of the procedural risks, benefits and alternatives. All questions were addressed. Maximal Sterile Barrier Technique was utilized including caps, mask, sterile gowns, sterile gloves, sterile drape, hand hygiene and skin antiseptic. A timeout was performed prior to the initiation of the procedure. The patient was placed prone on the CT exam table. Limited CT of the pelvis was performed for planning purposes. Skin entry site was marked, and the overlying skin was prepped and draped in the standard sterile fashion. Local analgesia was obtained with 1% lidocaine. Using CT guidance, an 11 gauge needle was advanced just deep to the cortex of the right posterior ilium. Subsequently, bone marrow aspiration and core biopsy were performed. Specimens were submitted to lab/pathology for handling. Hemostasis was achieved with manual pressure, and a clean dressing was placed. The patient tolerated the procedure well without immediate complication. IMPRESSION: Successful CT-guided bone marrow aspiration and core biopsy of the right posterior ilium. Electronically Signed   By: Albin Felling M.D.   On: 07/23/2022 14:44

## 2022-09-30 NOTE — Assessment & Plan Note (Signed)
recommend patient to avoid nephrotoxin.  Encourage oral hydration.

## 2022-09-30 NOTE — Assessment & Plan Note (Signed)
Continue monitor

## 2022-10-01 LAB — KAPPA/LAMBDA LIGHT CHAINS
Kappa free light chain: 10.2 mg/L (ref 3.3–19.4)
Kappa, lambda light chain ratio: 2.49 — ABNORMAL HIGH (ref 0.26–1.65)
Lambda free light chains: 4.1 mg/L — ABNORMAL LOW (ref 5.7–26.3)

## 2022-10-06 NOTE — Progress Notes (Signed)
Patient for US guided FNA Isthmus Inferior & LT Mid Thyroid Nodule Bx on 10/07/2022, I called and spoke with the patient's daughter, Stephanie Delacruz on the phone and gave pre-procedure instructions. Pt's daughter was made aware to have the pt here at 2p at the [facility] registration.  Called   10/02/2022

## 2022-10-07 ENCOUNTER — Ambulatory Visit
Admission: RE | Admit: 2022-10-07 | Discharge: 2022-10-07 | Disposition: A | Discharge status: Home / Self Care | Payer: Medicare Other | Source: Ambulatory Visit | Attending: Internal Medicine | Admitting: Internal Medicine

## 2022-10-07 DIAGNOSIS — E042 Nontoxic multinodular goiter: Secondary | ICD-10-CM | POA: Diagnosis not present

## 2022-10-07 DIAGNOSIS — D440 Neoplasm of uncertain behavior of thyroid gland: Secondary | ICD-10-CM | POA: Diagnosis not present

## 2022-10-07 LAB — MULTIPLE MYELOMA PANEL, SERUM
Albumin SerPl Elph-Mcnc: 3.1 g/dL (ref 2.9–4.4)
Albumin/Glob SerPl: 1.2 (ref 0.7–1.7)
Alpha 1: 0.2 g/dL (ref 0.0–0.4)
Alpha2 Glob SerPl Elph-Mcnc: 0.7 g/dL (ref 0.4–1.0)
B-Globulin SerPl Elph-Mcnc: 1.2 g/dL (ref 0.7–1.3)
Gamma Glob SerPl Elph-Mcnc: 0.4 g/dL (ref 0.4–1.8)
Globulin, Total: 2.6 g/dL (ref 2.2–3.9)
IgA: 257 mg/dL (ref 64–422)
IgG (Immunoglobin G), Serum: 450 mg/dL — ABNORMAL LOW (ref 586–1602)
IgM (Immunoglobulin M), Srm: 11 mg/dL — ABNORMAL LOW (ref 26–217)
M Protein SerPl Elph-Mcnc: 0.1 g/dL — ABNORMAL HIGH
Total Protein ELP: 5.7 g/dL — ABNORMAL LOW (ref 6.0–8.5)

## 2022-10-07 MED ORDER — LIDOCAINE HCL (PF) 1 % IJ SOLN
4.0000 mL | Freq: Once | INTRAMUSCULAR | Status: AC
  Administered 2022-10-07: 4 mL via INTRADERMAL
  Filled 2022-10-07: qty 4

## 2022-10-07 NOTE — Procedures (Signed)
Successful US guided FNA of inferior isthmus thyroid nodule No complications. See PACS for full report.  Successful US guided FNA of mid left thyroid nodule No complications. See PACS for full report.    Narda Rutherford, AGNP-BC 10/07/2022, 4:04 PM   \

## 2022-10-07 NOTE — Discharge Instructions (Signed)
Discharge instructions reviewed with patient.

## 2022-10-08 LAB — CYTOLOGY - NON PAP

## 2022-10-23 ENCOUNTER — Encounter: Payer: Self-pay | Admitting: Internal Medicine

## 2022-10-23 LAB — CYTOLOGY - NON PAP

## 2022-10-28 ENCOUNTER — Inpatient Hospital Stay: Payer: Medicare Other | Attending: Oncology

## 2022-10-28 ENCOUNTER — Encounter: Payer: Self-pay | Admitting: Oncology

## 2022-10-28 ENCOUNTER — Inpatient Hospital Stay (HOSPITAL_BASED_OUTPATIENT_CLINIC_OR_DEPARTMENT_OTHER): Payer: Medicare Other | Admitting: Oncology

## 2022-10-28 ENCOUNTER — Inpatient Hospital Stay: Payer: Medicare Other

## 2022-10-28 VITALS — BP 154/64 | HR 66 | Temp 97.2°F | Resp 18 | Wt 139.4 lb

## 2022-10-28 DIAGNOSIS — Z8261 Family history of arthritis: Secondary | ICD-10-CM | POA: Diagnosis not present

## 2022-10-28 DIAGNOSIS — Z803 Family history of malignant neoplasm of breast: Secondary | ICD-10-CM | POA: Diagnosis not present

## 2022-10-28 DIAGNOSIS — I129 Hypertensive chronic kidney disease with stage 1 through stage 4 chronic kidney disease, or unspecified chronic kidney disease: Secondary | ICD-10-CM | POA: Diagnosis not present

## 2022-10-28 DIAGNOSIS — Z5112 Encounter for antineoplastic immunotherapy: Secondary | ICD-10-CM | POA: Insufficient documentation

## 2022-10-28 DIAGNOSIS — Z8249 Family history of ischemic heart disease and other diseases of the circulatory system: Secondary | ICD-10-CM | POA: Insufficient documentation

## 2022-10-28 DIAGNOSIS — M899 Disorder of bone, unspecified: Secondary | ICD-10-CM | POA: Diagnosis not present

## 2022-10-28 DIAGNOSIS — K219 Gastro-esophageal reflux disease without esophagitis: Secondary | ICD-10-CM | POA: Insufficient documentation

## 2022-10-28 DIAGNOSIS — Z79624 Long term (current) use of inhibitors of nucleotide synthesis: Secondary | ICD-10-CM | POA: Diagnosis not present

## 2022-10-28 DIAGNOSIS — M549 Dorsalgia, unspecified: Secondary | ICD-10-CM | POA: Insufficient documentation

## 2022-10-28 DIAGNOSIS — Z79899 Other long term (current) drug therapy: Secondary | ICD-10-CM | POA: Insufficient documentation

## 2022-10-28 DIAGNOSIS — D649 Anemia, unspecified: Secondary | ICD-10-CM | POA: Diagnosis not present

## 2022-10-28 DIAGNOSIS — N1831 Chronic kidney disease, stage 3a: Secondary | ICD-10-CM

## 2022-10-28 DIAGNOSIS — C9001 Multiple myeloma in remission: Secondary | ICD-10-CM

## 2022-10-28 DIAGNOSIS — Z823 Family history of stroke: Secondary | ICD-10-CM | POA: Insufficient documentation

## 2022-10-28 DIAGNOSIS — Z5111 Encounter for antineoplastic chemotherapy: Secondary | ICD-10-CM

## 2022-10-28 DIAGNOSIS — R5383 Other fatigue: Secondary | ICD-10-CM | POA: Insufficient documentation

## 2022-10-28 DIAGNOSIS — C9000 Multiple myeloma not having achieved remission: Secondary | ICD-10-CM | POA: Insufficient documentation

## 2022-10-28 DIAGNOSIS — E7800 Pure hypercholesterolemia, unspecified: Secondary | ICD-10-CM | POA: Diagnosis not present

## 2022-10-28 DIAGNOSIS — Z7982 Long term (current) use of aspirin: Secondary | ICD-10-CM | POA: Insufficient documentation

## 2022-10-28 LAB — COMPREHENSIVE METABOLIC PANEL
ALT: 20 U/L (ref 0–44)
AST: 29 U/L (ref 15–41)
Albumin: 3.6 g/dL (ref 3.5–5.0)
Alkaline Phosphatase: 49 U/L (ref 38–126)
Anion gap: 8 (ref 5–15)
BUN: 23 mg/dL (ref 8–23)
CO2: 25 mmol/L (ref 22–32)
Calcium: 9.5 mg/dL (ref 8.9–10.3)
Chloride: 109 mmol/L (ref 98–111)
Creatinine, Ser: 1.2 mg/dL — ABNORMAL HIGH (ref 0.44–1.00)
GFR, Estimated: 44 mL/min — ABNORMAL LOW (ref >60)
Glucose, Bld: 86 mg/dL (ref 70–99)
Potassium: 3.6 mmol/L (ref 3.5–5.1)
Sodium: 142 mmol/L (ref 135–145)
Total Bilirubin: 0.3 mg/dL (ref 0.3–1.2)
Total Protein: 6.4 g/dL — ABNORMAL LOW (ref 6.5–8.1)

## 2022-10-28 LAB — CBC WITH DIFFERENTIAL/PLATELET
Abs Immature Granulocytes: 0.02 10*3/uL (ref 0.00–0.07)
Basophils Absolute: 0 10*3/uL (ref 0.0–0.1)
Basophils Relative: 1 %
Eosinophils Absolute: 0.1 10*3/uL (ref 0.0–0.5)
Eosinophils Relative: 2 %
HCT: 31.9 % — ABNORMAL LOW (ref 36.0–46.0)
Hemoglobin: 10.3 g/dL — ABNORMAL LOW (ref 12.0–15.0)
Immature Granulocytes: 0 %
Lymphocytes Relative: 44 %
Lymphs Abs: 2.1 10*3/uL (ref 0.7–4.0)
MCH: 32.4 pg (ref 26.0–34.0)
MCHC: 32.3 g/dL (ref 30.0–36.0)
MCV: 100.3 fL — ABNORMAL HIGH (ref 80.0–100.0)
Monocytes Absolute: 0.3 10*3/uL (ref 0.1–1.0)
Monocytes Relative: 6 %
Neutro Abs: 2.1 10*3/uL (ref 1.7–7.7)
Neutrophils Relative %: 47 %
Platelets: 189 10*3/uL (ref 150–400)
RBC: 3.18 MIL/uL — ABNORMAL LOW (ref 3.87–5.11)
RDW: 13.4 % (ref 11.5–15.5)
WBC: 4.7 10*3/uL (ref 4.0–10.5)
nRBC: 0 % (ref 0.0–0.2)

## 2022-10-28 MED ORDER — SODIUM CHLORIDE 0.9 % IV SOLN
INTRAVENOUS | Status: DC
  Filled 2022-10-28: qty 250

## 2022-10-28 MED ORDER — ZOLEDRONIC ACID 4 MG/100ML IV SOLN
4.0000 mg | Freq: Once | INTRAVENOUS | Status: AC
  Administered 2022-10-28: 4 mg via INTRAVENOUS
  Filled 2022-10-28: qty 100

## 2022-10-28 MED ORDER — DARATUMUMAB-HYALURONIDASE-FIHJ 1800-30000 MG-UT/15ML ~~LOC~~ SOLN
1800.0000 mg | Freq: Once | SUBCUTANEOUS | Status: AC
  Administered 2022-10-28: 1800 mg via SUBCUTANEOUS
  Filled 2022-10-28: qty 15

## 2022-10-28 MED ORDER — DIPHENHYDRAMINE HCL 25 MG PO CAPS
50.0000 mg | ORAL_CAPSULE | Freq: Once | ORAL | Status: AC
  Administered 2022-10-28: 50 mg via ORAL
  Filled 2022-10-28: qty 2

## 2022-10-28 MED ORDER — ACETAMINOPHEN 325 MG PO TABS
650.0000 mg | ORAL_TABLET | Freq: Once | ORAL | Status: AC
  Administered 2022-10-28: 650 mg via ORAL
  Filled 2022-10-28: qty 2

## 2022-10-28 NOTE — Assessment & Plan Note (Signed)
Continue monitor

## 2022-10-28 NOTE — Assessment & Plan Note (Signed)
Continue Q3 months Zometa- today, and next due July  2024 Recommend calcium 1200 mg daily.

## 2022-10-28 NOTE — Assessment & Plan Note (Signed)
Multiple myeloma, IgA kappa, complex hyperploid gain of 1q, S/p Daratumumab Rd.   Revlimid D1-21,  bone marrow biopsy results were reviewed and discussed with patient - 3% plasma cell, no M protein. She is in [facility]  Labs are reviewed and discussed with patient.  Proceed with  Daratumumab monotherapy maintenance - she takes home supply of Dexamethasone prior to her Daratumumab.  Continue asprin 81mg daily and Acyclovir 400mg BID.  

## 2022-10-28 NOTE — Patient Instructions (Signed)
Stephanie Delacruz  Discharge Instructions: Thank you for choosing [facility] to provide your oncology and hematology care.  If you have a lab appointment with the [facility], please go directly to the [facility] and check in at the registration area.  Wear comfortable clothing and clothing appropriate for easy access to any Portacath or PICC line.   We strive to give you quality time with your provider. You may need to reschedule your appointment if you arrive late (15 or more minutes).  Arriving late affects you and other patients whose appointments are after yours.  Also, if you miss three or more appointments without notifying the office, you may be dismissed from the clinic at the provider's discretion.      For prescription refill requests, have your pharmacy contact our office and allow 72 hours for refills to be completed.    Today you received the following chemotherapy and/or immunotherapy agents Zometa and Darzalex.      To help prevent nausea and vomiting after your treatment, we encourage you to take your nausea medication as directed.  BELOW ARE SYMPTOMS THAT SHOULD BE REPORTED IMMEDIATELY: *FEVER GREATER THAN 100.4 F (38 C) OR HIGHER *CHILLS OR SWEATING *NAUSEA AND VOMITING THAT IS NOT CONTROLLED WITH YOUR NAUSEA MEDICATION *UNUSUAL SHORTNESS OF BREATH *UNUSUAL BRUISING OR BLEEDING *URINARY PROBLEMS (pain or burning when urinating, or frequent urination) *BOWEL PROBLEMS (unusual diarrhea, constipation, pain near the anus) TENDERNESS IN MOUTH AND THROAT WITH OR WITHOUT PRESENCE OF ULCERS (sore throat, sores in mouth, or a toothache) UNUSUAL RASH, SWELLING OR PAIN  UNUSUAL VAGINAL DISCHARGE OR ITCHING   Items with * indicate a potential emergency and should be followed up as soon as possible or go to the Emergency Department if any problems should occur.  Please show the CHEMOTHERAPY ALERT CARD or IMMUNOTHERAPY ALERT CARD at  check-in to the Emergency Department and triage nurse.  Should you have questions after your visit or need to cancel or reschedule your appointment, please contact [facility]  [phone] and follow the prompts.  Office hours are 8:00 a.m. to 4:30 p.m. Monday - Friday. Please note that voicemails left after 4:00 p.m. may not be returned until the following business day.  We are closed weekends and major holidays. You have access to a nurse at all times for urgent questions. Please call the main number to the clinic [phone] and follow the prompts.  For any non-urgent questions, you may also contact your provider using MyChart. We now offer e-Visits for anyone 54 and older to request care online for non-urgent symptoms. For details visit [email or web address].   Also download the MyChart app! Go to the app store, search "MyChart", open the app, select [facility], and log in with your MyChart username and password.

## 2022-10-28 NOTE — Assessment & Plan Note (Signed)
Chemotherapy plan as listed as above. 

## 2022-10-28 NOTE — Assessment & Plan Note (Signed)
recommend patient to avoid nephrotoxin.  Encourage oral hydration. 

## 2022-10-28 NOTE — Progress Notes (Signed)
Hematology/Oncology Progress note [phone] [phone]     CHIEF COMPLAINTS/REASON FOR VISIT:  Follow-up for multiple myeloma  ASSESSMENT & PLAN:   Cancer Staging  Multiple myeloma Staging form: Plasma Cell Myeloma and Plasma Cell Disorders, AJCC 8th Edition - Clinical stage from 08/22/2021: RISS Stage II (Beta-2-microglobulin (mg/L): 4.1, Albumin (g/dL): 2.8, ISS: Stage II, High-risk cytogenetics: Absent, LDH: Normal) - Signed by Earlie Server, MD on 09/06/2021   Multiple myeloma (HCC) Multiple myeloma, IgA kappa, complex hyperploid gain of 1q, S/p Daratumumab Rd.   Revlimid D1-21,  bone marrow biopsy results were reviewed and discussed with patient - 3% plasma cell, no M protein. She is in [employer] are reviewed and discussed with patient.  Proceed with  Daratumumab monotherapy maintenance - she takes home supply of Dexamethasone prior to her Daratumumab.  Continue asprin 81mg  daily and Acyclovir 400mg  BID.   Bone lesion Continue Q3 months Zometa- today, and next due July  2024 Recommend calcium 1200 mg daily.  Normocytic anemia Continue monitor.   Stage 3a chronic kidney disease ([place]) recommend patient to avoid nephrotoxin.  Encourage oral hydration.  Encounter for antineoplastic chemotherapy Chemotherapy plan as listed as above.  No orders of the defined types were placed in this encounter.   Follow up  Lab/MD/Dara in 4 weeks  All questions were answered. The patient knows to call the clinic with any problems, questions or concerns.  Earlie Server, MD, PhD [facility] Hematology Oncology 10/28/2022     HISTORY OF PRESENTING ILLNESS:   Jennfier Mailhot Delacruz is a  89 y.o.  female presents for follow up of multiple myeloma.  Oncology History  Multiple myeloma  08/22/2021 Initial Diagnosis   Multiple myeloma (HCC) IgA kappa Initial M protein 5.2, free kappa light chain ratio 89.2, Kappa/lambda ratio 13.31   08/22/2021 Cancer Staging   Staging form: Plasma  Cell Myeloma and Plasma Cell Disorders, AJCC 8th Edition - Clinical stage from 08/22/2021: RISS Stage II (Beta-2-microglobulin (mg/L): 4.1, Albumin (g/dL): 2.8, ISS: Stage II, High-risk cytogenetics: Absent, LDH: Normal) - Signed by Earlie Server, MD on 09/06/2021 Stage prefix: Initial diagnosis Beta 2 microglobulin range (mg/L): 3.5 to 5.49 Albumin range (g/dL): Less than 3.5 Cytogenetics: 1q addition   08/26/2021 - 09/01/2022 Chemotherapy   08/26/21 MYELOMA Daratumumab SQ q28d  09/06/2021 added Revlimid 10mg  x 14 days.  09/27/2021 starting cycle 2 Revlimid 10 mg 21 days on, 7 days off. Weekly Dexamethasone.    08/26/2021 Bone Marrow Biopsy   Bone marrow biopsy showed hypercellular marrow with plasma cell neoplasm. 67% plasma cells in the aspirate. Cytogenetics showed complex hyperplasia, gain of 1 q.    07/23/2022 Bone Marrow Biopsy   Bone marrow biopsy showed Hypercellular bone marrow for age with trilineage hematopoiesis and 3% plasma cells  Cytogenetics are normal.     INTERVAL HISTORY Stephanie Delacruz is a 89 y.o. female who has above history reviewed by me today presents for follow up visit for management of multiple myeloma She was accompanied by her daughter today.  She denies any nausea vomiting diarrhea.   No new complaints.  S/p thyroid lesion biopsy, obtained by endocrinologist.    Review of Systems  Constitutional:  Positive for fatigue. Negative for appetite change, chills and fever.  HENT:   Negative for hearing loss and voice change.   Eyes:  Negative for eye problems.  Respiratory:  Negative for chest tightness and cough.   Cardiovascular:  Negative for chest pain.  Gastrointestinal:  Negative for abdominal  distention, abdominal pain and blood in stool.  Endocrine: Negative for hot flashes.  Genitourinary:  Negative for difficulty urinating and frequency.   Musculoskeletal:  Positive for back pain. Negative for arthralgias.  Skin:  Negative for itching and rash.  Neurological:   Negative for extremity weakness.  Hematological:  Negative for adenopathy.  Psychiatric/Behavioral:  Negative for confusion.     MEDICAL HISTORY:  Past Medical History:  Diagnosis Date   Anemia    GERD (gastroesophageal reflux disease)    Hypercholesterolemia    Hypertension    Renal insufficiency     SURGICAL HISTORY: Past Surgical History:  Procedure Laterality Date   TONSILLECTOMY     TUBAL LIGATION      SOCIAL HISTORY: Social History   Socioeconomic History   Marital status: Married    Spouse name: Not on file   Number of children: Not on file   Years of education: Not on file   Highest education level: Not on file  Occupational History   Not on file  Tobacco Use   Smoking status: Never   Smokeless tobacco: Never  Vaping Use   Vaping Use: Never used  Substance and Sexual Activity   Alcohol use: No   Drug use: No   Sexual activity: Not on file  Other Topics Concern   Not on file  Social History Narrative   Lives with husband Jeneen Rinks and daughter Levada Dy. No pets.    Social Determinants of Health   Financial Resource Strain: Not on file  Food Insecurity: Not on file  Transportation Needs: Not on file  Physical Activity: Not on file  Stress: Not on file  Social Connections: Not on file  Intimate Partner Violence: Not on file    FAMILY HISTORY: Family History  Problem Relation Age of Onset   Hypertension Mother    Arthritis Mother    Hypertension Father    Stroke Father    Breast cancer Niece     ALLERGIES:  has No Known Allergies.  MEDICATIONS:  Current Outpatient Medications  Medication Sig Dispense Refill   acyclovir (ZOVIRAX) 400 MG tablet TAKE 1 TABLET(400 MG) BY MOUTH TWICE DAILY 60 tablet 11   amLODipine (NORVASC) 2.5 MG tablet Take 5 mg by mouth daily.     ascorbic acid (VITAMIN C) 250 MG tablet daily Take 2 tablets every day by oral route.     aspirin 81 MG EC tablet Take 81 mg by mouth daily.     Calcium Carb-Cholecalciferol (CALCIUM  600+D) 600-20 MG-MCG TABS Take 1 tablet by mouth 3 (three) times daily.     dexamethasone (DECADRON) 4 MG tablet Take 5 tablets (20 mg total) by mouth once a week. 60 tablet 0   donepezil (ARICEPT) 10 MG tablet Take 1 tablet (10 mg total) by mouth daily. 90 tablet 2   fenofibrate (TRICOR) 48 MG tablet TAKE 1 TABLET(48 MG) BY MOUTH DAILY 30 tablet 3   fexofenadine (ALLEGRA) 180 MG tablet Take by mouth.     hydrochlorothiazide (HYDRODIURIL) 25 MG tablet TAKE 1/2 TABLET BY MOUTH EVERY DAY 45 tablet 1   memantine (NAMENDA) 5 MG tablet Take 5 mg by mouth 2 (two) times daily.     methimazole (TAPAZOLE) 5 MG tablet Take 1 tablet (5 mg total) by mouth daily. 90 tablet 2   metoprolol succinate (TOPROL-XL) 25 MG 24 hr tablet TAKE 2 TABLETS(50 MG) BY MOUTH DAILY 180 tablet 1   montelukast (SINGULAIR) 10 MG tablet Take 1 tablet (10 mg total)  by mouth See admin instructions. Take 1 tablet one day before Daratumumab injection. 60 tablet 0   Multiple Vitamins-Minerals (WOMENS MULTIVITAMIN PO) Take 1 tablet by mouth daily.     omeprazole (PRILOSEC) 20 MG capsule TAKE 1 CAPSULE(20 MG) BY MOUTH DAILY 90 capsule 3   prochlorperazine (COMPAZINE) 10 MG tablet Take 10 mg by mouth every 6 (six) hours as needed for nausea or vomiting.     valsartan (DIOVAN) 160 MG tablet Take 1 tablet (160 mg total) by mouth daily. 90 tablet 1   No current facility-administered medications for this visit.     PHYSICAL EXAMINATION: ECOG PERFORMANCE STATUS: 1 - Symptomatic but completely ambulatory Vitals:   10/28/22 0908  BP: (!) 154/64  Pulse: 66  Resp: 18  Temp: (!) 97.2 F (36.2 C)  SpO2: 100%   Filed Weights   10/28/22 0908  Weight: 139 lb 6.4 oz (63.2 kg)    Physical Exam Constitutional:      General: She is not in acute distress. HENT:     Head: Normocephalic and atraumatic.  Eyes:     General: No scleral icterus. Cardiovascular:     Rate and Rhythm: Normal rate and regular rhythm.     Heart sounds: Normal  heart sounds.  Pulmonary:     Effort: Pulmonary effort is normal. No respiratory distress.     Breath sounds: No wheezing.  Abdominal:     General: Bowel sounds are normal. There is no distension.     Palpations: Abdomen is soft.  Musculoskeletal:        General: No deformity. Normal range of motion.     Cervical back: Normal range of motion and neck supple.  Skin:    General: Skin is warm and dry.     Findings: No erythema or rash.  Neurological:     Mental Status: She is alert and oriented to person, place, and time. Mental status is at baseline.     Cranial Nerves: No cranial nerve deficit.     Coordination: Coordination normal.  Psychiatric:        Mood and Affect: Mood normal.     LABORATORY DATA:  I have reviewed the data as listed    Latest Ref Rng & Units 10/28/2022    8:44 AM 09/30/2022    8:39 AM 09/02/2022    9:05 AM  CBC  WBC 4.0 - 10.5 K/uL 4.7  4.6  4.2   Hemoglobin 12.0 - 15.0 g/dL 10.3  10.2  10.0   Hematocrit 36.0 - 46.0 % 31.9  31.2  31.4   Platelets 150 - 400 K/uL 189  178  234       Latest Ref Rng & Units 10/28/2022    8:44 AM 09/30/2022    8:39 AM 09/02/2022    9:05 AM  CMP  Glucose 70 - 99 mg/dL 86  86  86   BUN 8 - 23 mg/dL 23  15  16    Creatinine 0.44 - 1.00 mg/dL 1.20  1.01  1.02   Sodium 135 - 145 mmol/L 142  143  141   Potassium 3.5 - 5.1 mmol/L 3.6  3.7  3.6   Chloride 98 - 111 mmol/L 109  108  107   CO2 22 - 32 mmol/L 25  25  26    Calcium 8.9 - 10.3 mg/dL 9.5  9.1  9.4   Total Protein 6.5 - 8.1 g/dL 6.4  6.3  6.2   Total Bilirubin 0.3 - 1.2 mg/dL  0.3  0.4  0.2   Alkaline Phos 38 - 126 U/L 49  46  45   AST 15 - 41 U/L 29  28  23    ALT 0 - 44 U/L 20  17  17       Iron/TIBC/Ferritin/ %Sat    Component Value Date/Time   IRON 54 07/24/2021 1212   TIBC 277 07/24/2021 1212   FERRITIN 93 07/24/2021 1212   IRONPCTSAT 20 07/24/2021 1212       RADIOGRAPHIC STUDIES: I have personally reviewed the radiological images as listed and agreed with  the findings in the report. [place] FNA BX THYROID 1ST LESION AFIRMA  Result Date: 10/07/2022 INDICATION: Indeterminate thyroid nodule EXAM: ULTRASOUND GUIDED FINE NEEDLE ASPIRATION OF INDETERMINATE THYROID NODULE x 2 COMPARISON:  Ultrasound thyroid study dated 08/27/2022 MEDICATIONS: 1 % lidocaine COMPLICATIONS: None immediate. TECHNIQUE: Informed written consent was obtained from the patient after a discussion of the risks, benefits and alternatives to treatment. Questions regarding the procedure were encouraged and answered. A timeout was performed prior to the initiation of the procedure. Pre-procedural ultrasound scanning demonstrated unchanged size and appearance of the indeterminate nodules within the inferior isthmus The procedure was planned. The neck was prepped in the usual sterile fashion, and a sterile drape was applied covering the operative field. A timeout was performed prior to the initiation of the procedure. Local anesthesia was provided with 1% lidocaine. Under direct ultrasound guidance, 6 FNA biopsies were performed of the inferior isthmus nodule with a 25 gauge needle. One of those were obtained for [facility]. Multiple ultrasound images were saved for procedural documentation purposes. The samples were prepared and submitted to pathology. Under direct ultrasound guidance, 5 FNA biopsies were performed of the mid left thyroid nodule with a 25 gauge needle. 2 of those were obtained for [facility]. Multiple ultrasound images were saved for procedural documentation purposes. The samples were prepared and submitted to pathology. Limited post procedural scanning was negative for hematoma or additional complication. Dressings were placed. The patient tolerated the above procedures procedure well without immediate postprocedural complication. FINDINGS: Nodule reference number based on prior diagnostic ultrasound: 2 Maximum size: 3.2 cm Location: Isthmus; Inferior ACR TI-RADS risk category: TR3 (3 points)  Reason for biopsy: meets ACR TI-RADS criteria _________________________________________________________ Nodule reference number based on prior diagnostic ultrasound: 4 Maximum size: 2.7 cm Location: Left; Mid ACR TI-RADS risk category: TR3 (3 points) Reason for biopsy: meets ACR TI-RADS criteria Ultrasound imaging confirms appropriate placement of the needles within the thyroid nodule. IMPRESSION: 1. Technically successful ultrasound guided fine needle aspiration of inferior isthmus thyroid nodule 2. Technically successful ultrasound guided fine needle aspiration of left mid thyroid nodule Read by: Narda Rutherford, AGNP-BC Electronically Signed   By: Ruthann Cancer M.D.   On: 10/07/2022 16:53   [place] FNA BIOPSY THYROID EA ADD LESION AFIRMA  Result Date: 10/07/2022 INDICATION: Indeterminate thyroid nodule EXAM: ULTRASOUND GUIDED FINE NEEDLE ASPIRATION OF INDETERMINATE THYROID NODULE x 2 COMPARISON:  Ultrasound thyroid study dated 08/27/2022 MEDICATIONS: 1 % lidocaine COMPLICATIONS: None immediate. TECHNIQUE: Informed written consent was obtained from the patient after a discussion of the risks, benefits and alternatives to treatment. Questions regarding the procedure were encouraged and answered. A timeout was performed prior to the initiation of the procedure. Pre-procedural ultrasound scanning demonstrated unchanged size and appearance of the indeterminate nodules within the inferior isthmus The procedure was planned. The neck was prepped in the usual sterile fashion, and a sterile drape was applied covering the operative field. A timeout  was performed prior to the initiation of the procedure. Local anesthesia was provided with 1% lidocaine. Under direct ultrasound guidance, 6 FNA biopsies were performed of the inferior isthmus nodule with a 25 gauge needle. One of those were obtained for [facility]. Multiple ultrasound images were saved for procedural documentation purposes. The samples were prepared and submitted to  pathology. Under direct ultrasound guidance, 5 FNA biopsies were performed of the mid left thyroid nodule with a 25 gauge needle. 2 of those were obtained for [facility]. Multiple ultrasound images were saved for procedural documentation purposes. The samples were prepared and submitted to pathology. Limited post procedural scanning was negative for hematoma or additional complication. Dressings were placed. The patient tolerated the above procedures procedure well without immediate postprocedural complication. FINDINGS: Nodule reference number based on prior diagnostic ultrasound: 2 Maximum size: 3.2 cm Location: Isthmus; Inferior ACR TI-RADS risk category: TR3 (3 points) Reason for biopsy: meets ACR TI-RADS criteria _________________________________________________________ Nodule reference number based on prior diagnostic ultrasound: 4 Maximum size: 2.7 cm Location: Left; Mid ACR TI-RADS risk category: TR3 (3 points) Reason for biopsy: meets ACR TI-RADS criteria Ultrasound imaging confirms appropriate placement of the needles within the thyroid nodule. IMPRESSION: 1. Technically successful ultrasound guided fine needle aspiration of inferior isthmus thyroid nodule 2. Technically successful ultrasound guided fine needle aspiration of left mid thyroid nodule Read by: Narda Rutherford, AGNP-BC Electronically Signed   By: Ruthann Cancer M.D.   On: 10/07/2022 16:26   US THYROID  Result Date: 08/27/2022 CLINICAL DATA:  Palpable abnormality. EXAM: THYROID ULTRASOUND TECHNIQUE: Ultrasound examination of the thyroid gland and adjacent soft tissues was performed. COMPARISON:  None Available. FINDINGS: Parenchymal Echotexture: Mildly heterogenous Isthmus: 0.9 cm Right lobe: 4.7 x 2.8 x 2.8 cm Left lobe: 4.6 x 2.2 x 2.2 cm _________________________________________________________ Estimated total number of nodules >/= 1 cm: 3 Number of spongiform nodules >/=  2 cm not described below (TR1): 0 Number of mixed cystic and solid nodules  >/= 1.5 cm not described below (TR2): 0 _________________________________________________________ Nodule # 1: Approximately 1.2 cm spongiform nodule in the thyroid isthmus is considered sonographically low risk/benign. No imaging follow-up recommended. Nodule # 2: Location: Isthmus; Inferior Maximum size: 3.2 cm; Other 2 dimensions: 2.4 x 3.1 cm Composition: solid/almost completely solid (2) Echogenicity: isoechoic (1) Shape: not taller-than-wide (0) Margins: smooth (0) Echogenic foci: none (0) ACR TI-RADS total points: 3. ACR TI-RADS risk category: TR3 (3 points). ACR TI-RADS recommendations: **Given size (>/= 2.5 cm) and appearance, fine needle aspiration of this mildly suspicious nodule should be considered based on TI-RADS criteria. _________________________________________________________ Nodule # 3: Anechoic cyst with peripheral colloid artifact in the right mid gland measures up to 1.7 cm. Findings are consistent with a benign colloid nodule. Nodule # 4: Location: Left; Mid Maximum size: 2.7 cm; Other 2 dimensions: 1.5 x 2.1 cm Composition: solid/almost completely solid (2) Echogenicity: isoechoic (1) Shape: not taller-than-wide (0) Margins: ill-defined (0) Echogenic foci: none (0) ACR TI-RADS total points: 3. ACR TI-RADS risk category: TR3 (3 points). ACR TI-RADS recommendations: **Given size (>/= 2.5 cm) and appearance, fine needle aspiration of this mildly suspicious nodule should be considered based on TI-RADS criteria. _________________________________________________________ IMPRESSION: 1. Enlarged, heterogeneous and multinodular thyroid gland most consistent with multinodular goiter. 2. Large TI-RADS category 3 nodules in the thyroid isthmus (labeled # 2) and the left mid gland (labeled # 4) both meet criteria to consider fine-needle aspiration biopsy. The above is in keeping with the ACR TI-RADS recommendations - J Am Coll Radiol  [id number]. Electronically Signed   By: Jacqulynn Cadet M.D.    On: 08/27/2022 15:23

## 2022-10-29 LAB — KAPPA/LAMBDA LIGHT CHAINS
Kappa free light chain: 13.7 mg/L (ref 3.3–19.4)
Kappa, lambda light chain ratio: 2.63 — ABNORMAL HIGH (ref 0.26–1.65)
Lambda free light chains: 5.2 mg/L — ABNORMAL LOW (ref 5.7–26.3)

## 2022-10-31 LAB — MULTIPLE MYELOMA PANEL, SERUM
Albumin SerPl Elph-Mcnc: 3.5 g/dL (ref 2.9–4.4)
Albumin/Glob SerPl: 1.5 (ref 0.7–1.7)
Alpha 1: 0.2 g/dL (ref 0.0–0.4)
Alpha2 Glob SerPl Elph-Mcnc: 0.7 g/dL (ref 0.4–1.0)
B-Globulin SerPl Elph-Mcnc: 1.1 g/dL (ref 0.7–1.3)
Gamma Glob SerPl Elph-Mcnc: 0.5 g/dL (ref 0.4–1.8)
Globulin, Total: 2.5 g/dL (ref 2.2–3.9)
IgA: 268 mg/dL (ref 64–422)
IgG (Immunoglobin G), Serum: 467 mg/dL — ABNORMAL LOW (ref 586–1602)
IgM (Immunoglobulin M), Srm: 13 mg/dL — ABNORMAL LOW (ref 26–217)
Total Protein ELP: 6 g/dL (ref 6.0–8.5)

## 2022-11-03 ENCOUNTER — Other Ambulatory Visit: Payer: Self-pay | Admitting: Internal Medicine

## 2022-11-17 ENCOUNTER — Other Ambulatory Visit (INDEPENDENT_AMBULATORY_CARE_PROVIDER_SITE_OTHER): Payer: Medicare Other

## 2022-11-17 DIAGNOSIS — E0590 Thyrotoxicosis, unspecified without thyrotoxic crisis or storm: Secondary | ICD-10-CM

## 2022-11-17 LAB — T4, FREE: Free T4: 0.92 ng/dL (ref 0.60–1.60)

## 2022-11-17 LAB — TSH: TSH: 1.26 u[IU]/mL (ref 0.35–5.50)

## 2022-11-20 ENCOUNTER — Ambulatory Visit (INDEPENDENT_AMBULATORY_CARE_PROVIDER_SITE_OTHER): Payer: Medicare Other | Admitting: Internal Medicine

## 2022-11-20 ENCOUNTER — Encounter: Payer: Self-pay | Admitting: Internal Medicine

## 2022-11-20 VITALS — BP 116/72 | HR 69 | Temp 97.9°F | Resp 16 | Ht 61.0 in | Wt 142.0 lb

## 2022-11-20 DIAGNOSIS — E7800 Pure hypercholesterolemia, unspecified: Secondary | ICD-10-CM | POA: Diagnosis not present

## 2022-11-20 DIAGNOSIS — E0789 Other specified disorders of thyroid: Secondary | ICD-10-CM | POA: Diagnosis not present

## 2022-11-20 DIAGNOSIS — I10 Essential (primary) hypertension: Secondary | ICD-10-CM

## 2022-11-20 DIAGNOSIS — N1831 Chronic kidney disease, stage 3a: Secondary | ICD-10-CM

## 2022-11-20 DIAGNOSIS — F0390 Unspecified dementia without behavioral disturbance: Secondary | ICD-10-CM

## 2022-11-20 DIAGNOSIS — C9001 Multiple myeloma in remission: Secondary | ICD-10-CM

## 2022-11-20 DIAGNOSIS — D649 Anemia, unspecified: Secondary | ICD-10-CM | POA: Diagnosis not present

## 2022-11-20 DIAGNOSIS — Z862 Personal history of diseases of the blood and blood-forming organs and certain disorders involving the immune mechanism: Secondary | ICD-10-CM

## 2022-11-20 DIAGNOSIS — Z86010 Personal history of colonic polyps: Secondary | ICD-10-CM

## 2022-11-20 DIAGNOSIS — Z8659 Personal history of other mental and behavioral disorders: Secondary | ICD-10-CM

## 2022-11-20 MED ORDER — FENOFIBRATE 48 MG PO TABS: ORAL_TABLET | ORAL | 2 refills | Status: DC

## 2022-11-20 MED ORDER — VALSARTAN 160 MG PO TABS: 160.0000 mg | ORAL_TABLET | Freq: Every day | ORAL | 1 refills | Status: DC

## 2022-11-20 MED ORDER — AMLODIPINE BESYLATE 2.5 MG PO TABS: 5.0000 mg | ORAL_TABLET | Freq: Every day | ORAL | 2 refills | Status: DC

## 2022-11-20 MED ORDER — METOPROLOL SUCCINATE ER 25 MG PO TB24: ORAL_TABLET | ORAL | 1 refills | Status: DC

## 2022-11-20 MED ORDER — HYDROCHLOROTHIAZIDE 25 MG PO TABS: ORAL_TABLET | ORAL | 1 refills | Status: DC

## 2022-11-20 MED ORDER — OMEPRAZOLE 20 MG PO CPDR: DELAYED_RELEASE_CAPSULE | ORAL | 3 refills | Status: DC

## 2022-11-20 NOTE — Progress Notes (Signed)
Subjective:    Patient ID: Stephanie Delacruz, female    DOB: 08/14/1933, 89 y.o.   MRN: [id number]  Patient here for  Chief Complaint  Patient presents with   Medical Management of Chronic Issues    HPI Here to follow up regarding her blood pressure.  She is accompanied by her daughter.  History obtained from both of them.  Being followed by hematology for treatment - multiple myeloma. Treated with Daratumumab and revilimid. S/p bone marrow biopsy - 3% plasma cell, no M protein.  In CR. Last evaluated 10/28/22 - Zometa.  Next due - July.  Continues aspirin daily. Currently doing well.  No chest pain.  Breathing stable. Thyroid ultrasound - reveals changes that appear to be consistent with multinodular goiter. Saw endocrinology - recommended FNA. Molecular testing of the thyroid - no evidence of cancer. Recommended starting methimazole.  Taking and tolerating.  Eating.  No nausea or vomiting.  Bowels moving. Reviewed outside blood pressures.  Varying - 120-140/60-70s.  (Some higher readings at times).     Past Medical History:  Diagnosis Date   Anemia    GERD (gastroesophageal reflux disease)    Hypercholesterolemia    Hypertension    Renal insufficiency    Past Surgical History:  Procedure Laterality Date   TONSILLECTOMY     TUBAL LIGATION     Family History  Problem Relation Age of Onset   Hypertension Mother    Arthritis Mother    Hypertension Father    Stroke Father    Breast cancer Niece    Social History   Socioeconomic History   Marital status: Married    Spouse name: Not on file   Number of children: Not on file   Years of education: Not on file   Highest education level: Not on file  Occupational History   Not on file  Tobacco Use   Smoking status: Never   Smokeless tobacco: Never  Vaping Use   Vaping Use: Never used  Substance and Sexual Activity   Alcohol use: No   Drug use: No   Sexual activity: Not on file  Other Topics Concern   Not on file  Social  History Narrative   Lives with husband Fayrene Fearing and daughter Marylene Land. No pets.    Social Determinants of Health   Financial Resource Strain: Not on file  Food Insecurity: Not on file  Transportation Needs: Not on file  Physical Activity: Not on file  Stress: Not on file  Social Connections: Not on file     Review of Systems  Constitutional:  Negative for appetite change and unexpected weight change.  HENT:  Negative for congestion and sinus pressure.   Respiratory:  Negative for cough, chest tightness and shortness of breath.   Cardiovascular:  Negative for chest pain and palpitations.  Gastrointestinal:  Negative for abdominal pain, diarrhea, nausea and vomiting.  Genitourinary:  Negative for difficulty urinating and dysuria.  Musculoskeletal:  Negative for joint swelling and myalgias.  Skin:  Negative for color change and rash.  Neurological:  Negative for dizziness and headaches.  Psychiatric/Behavioral:  Negative for agitation and dysphoric mood.        Objective:     BP 116/72   Pulse 69   Temp 97.9 F (36.6 C)   Resp 16   Ht 5\' 1"  (1.549 m)   Wt 142 lb (64.4 kg)   SpO2 98%   BMI 26.83 kg/m  Wt Readings from Last 3 Encounters:  11/20/22  142 lb (64.4 kg)  10/28/22 139 lb 6.4 oz (63.2 kg)  09/30/22 141 lb 11.2 oz (64.3 kg)    Physical Exam Vitals reviewed.  Constitutional:      General: She is not in acute distress.    Appearance: Normal appearance.  HENT:     Head: Normocephalic and atraumatic.     Right Ear: External ear normal.     Left Ear: External ear normal.  Eyes:     General: No scleral icterus.       Right eye: No discharge.        Left eye: No discharge.     Conjunctiva/sclera: Conjunctivae normal.  Neck:     Thyroid: No thyromegaly.  Cardiovascular:     Rate and Rhythm: Normal rate and regular rhythm.  Pulmonary:     Effort: No respiratory distress.     Breath sounds: Normal breath sounds. No wheezing.  Abdominal:     General: Bowel  sounds are normal.     Palpations: Abdomen is soft.     Tenderness: There is no abdominal tenderness.  Musculoskeletal:        General: No swelling or tenderness.     Cervical back: Neck supple. No tenderness.  Lymphadenopathy:     Cervical: No cervical adenopathy.  Skin:    Findings: No erythema or rash.  Neurological:     Mental Status: She is alert.  Psychiatric:        Mood and Affect: Mood normal.        Behavior: Behavior normal.      Outpatient Encounter Medications as of 11/20/2022  Medication Sig   acyclovir (ZOVIRAX) 400 MG tablet TAKE 1 TABLET(400 MG) BY MOUTH TWICE DAILY   amLODipine (NORVASC) 2.5 MG tablet Take 2 tablets (5 mg total) by mouth daily.   ascorbic acid (VITAMIN C) 250 MG tablet daily Take 2 tablets every day by oral route.   aspirin 81 MG EC tablet Take 81 mg by mouth daily.   Calcium Carb-Cholecalciferol (CALCIUM 600+D) 600-20 MG-MCG TABS Take 1 tablet by mouth 3 (three) times daily.   dexamethasone (DECADRON) 4 MG tablet Take 5 tablets (20 mg total) by mouth once a week.   donepezil (ARICEPT) 10 MG tablet TAKE 1 TABLET(10 MG) BY MOUTH DAILY   fenofibrate (TRICOR) 48 MG tablet TAKE 1 TABLET(48 MG) BY MOUTH DAILY   fexofenadine (ALLEGRA) 180 MG tablet Take by mouth.   hydrochlorothiazide (HYDRODIURIL) 25 MG tablet TAKE 1/2 TABLET BY MOUTH EVERY DAY   memantine (NAMENDA) 5 MG tablet Take 5 mg by mouth 2 (two) times daily.   methimazole (TAPAZOLE) 5 MG tablet Take 1 tablet (5 mg total) by mouth daily.   metoprolol succinate (TOPROL-XL) 25 MG 24 hr tablet TAKE 2 TABLETS(50 MG) BY MOUTH DAILY   montelukast (SINGULAIR) 10 MG tablet Take 1 tablet (10 mg total) by mouth See admin instructions. Take 1 tablet one day before Daratumumab injection.   Multiple Vitamins-Minerals (WOMENS MULTIVITAMIN PO) Take 1 tablet by mouth daily.   omeprazole (PRILOSEC) 20 MG capsule TAKE 1 CAPSULE(20 MG) BY MOUTH DAILY   prochlorperazine (COMPAZINE) 10 MG tablet Take 10 mg by  mouth every 6 (six) hours as needed for nausea or vomiting.   valsartan (DIOVAN) 160 MG tablet Take 1 tablet (160 mg total) by mouth daily.   [DISCONTINUED] amLODipine (NORVASC) 2.5 MG tablet Take 5 mg by mouth daily.   [DISCONTINUED] fenofibrate (TRICOR) 48 MG tablet TAKE 1 TABLET(48 MG) BY MOUTH DAILY   [  DISCONTINUED] hydrochlorothiazide (HYDRODIURIL) 25 MG tablet TAKE 1/2 TABLET BY MOUTH EVERY DAY   [DISCONTINUED] metoprolol succinate (TOPROL-XL) 25 MG 24 hr tablet TAKE 2 TABLETS(50 MG) BY MOUTH DAILY   [DISCONTINUED] omeprazole (PRILOSEC) 20 MG capsule TAKE 1 CAPSULE(20 MG) BY MOUTH DAILY   [DISCONTINUED] valsartan (DIOVAN) 160 MG tablet Take 1 tablet (160 mg total) by mouth daily.   No facility-administered encounter medications on file as of 11/20/2022.     Lab Results  Component Value Date   WBC 4.7 10/28/2022   HGB 10.3 (L) 10/28/2022   HCT 31.9 (L) 10/28/2022   PLT 189 10/28/2022   GLUCOSE 86 10/28/2022   CHOL 115 06/27/2021   TRIG 34 06/27/2021   HDL 51 06/27/2021   LDLCALC 57 06/27/2021   ALT 20 10/28/2022   AST 29 10/28/2022   NA 142 10/28/2022   K 3.6 10/28/2022   CL 109 10/28/2022   CREATININE 1.20 (H) 10/28/2022   BUN 23 10/28/2022   CO2 25 10/28/2022   TSH 1.26 11/17/2022    [place] FNA BX THYROID 1ST LESION AFIRMA  Result Date: 10/07/2022 INDICATION: Indeterminate thyroid nodule EXAM: ULTRASOUND GUIDED FINE NEEDLE ASPIRATION OF INDETERMINATE THYROID NODULE x 2 COMPARISON:  Ultrasound thyroid study dated 08/27/2022 MEDICATIONS: 1 % lidocaine COMPLICATIONS: None immediate. TECHNIQUE: Informed written consent was obtained from the patient after a discussion of the risks, benefits and alternatives to treatment. Questions regarding the procedure were encouraged and answered. A timeout was performed prior to the initiation of the procedure. Pre-procedural ultrasound scanning demonstrated unchanged size and appearance of the indeterminate nodules within the inferior isthmus  The procedure was planned. The neck was prepped in the usual sterile fashion, and a sterile drape was applied covering the operative field. A timeout was performed prior to the initiation of the procedure. Local anesthesia was provided with 1% lidocaine. Under direct ultrasound guidance, 6 FNA biopsies were performed of the inferior isthmus nodule with a 25 gauge needle. One of those were obtained for [facility]. Multiple ultrasound images were saved for procedural documentation purposes. The samples were prepared and submitted to pathology. Under direct ultrasound guidance, 5 FNA biopsies were performed of the mid left thyroid nodule with a 25 gauge needle. 2 of those were obtained for [facility]. Multiple ultrasound images were saved for procedural documentation purposes. The samples were prepared and submitted to pathology. Limited post procedural scanning was negative for hematoma or additional complication. Dressings were placed. The patient tolerated the above procedures procedure well without immediate postprocedural complication. FINDINGS: Nodule reference number based on prior diagnostic ultrasound: 2 Maximum size: 3.2 cm Location: Isthmus; Inferior ACR TI-RADS risk category: TR3 (3 points) Reason for biopsy: meets ACR TI-RADS criteria _________________________________________________________ Nodule reference number based on prior diagnostic ultrasound: 4 Maximum size: 2.7 cm Location: Left; Mid ACR TI-RADS risk category: TR3 (3 points) Reason for biopsy: meets ACR TI-RADS criteria Ultrasound imaging confirms appropriate placement of the needles within the thyroid nodule. IMPRESSION: 1. Technically successful ultrasound guided fine needle aspiration of inferior isthmus thyroid nodule 2. Technically successful ultrasound guided fine needle aspiration of left mid thyroid nodule Read by: Alex Gardener, AGNP-BC Electronically Signed   By: Marliss Coots M.D.   On: 10/07/2022 16:53   [place] FNA BIOPSY THYROID EA ADD LESION  AFIRMA  Result Date: 10/07/2022 INDICATION: Indeterminate thyroid nodule EXAM: ULTRASOUND GUIDED FINE NEEDLE ASPIRATION OF INDETERMINATE THYROID NODULE x 2 COMPARISON:  Ultrasound thyroid study dated 08/27/2022 MEDICATIONS: 1 % lidocaine COMPLICATIONS: None immediate. TECHNIQUE: Informed written consent  was obtained from the patient after a discussion of the risks, benefits and alternatives to treatment. Questions regarding the procedure were encouraged and answered. A timeout was performed prior to the initiation of the procedure. Pre-procedural ultrasound scanning demonstrated unchanged size and appearance of the indeterminate nodules within the inferior isthmus The procedure was planned. The neck was prepped in the usual sterile fashion, and a sterile drape was applied covering the operative field. A timeout was performed prior to the initiation of the procedure. Local anesthesia was provided with 1% lidocaine. Under direct ultrasound guidance, 6 FNA biopsies were performed of the inferior isthmus nodule with a 25 gauge needle. One of those were obtained for [facility]. Multiple ultrasound images were saved for procedural documentation purposes. The samples were prepared and submitted to pathology. Under direct ultrasound guidance, 5 FNA biopsies were performed of the mid left thyroid nodule with a 25 gauge needle. 2 of those were obtained for [facility]. Multiple ultrasound images were saved for procedural documentation purposes. The samples were prepared and submitted to pathology. Limited post procedural scanning was negative for hematoma or additional complication. Dressings were placed. The patient tolerated the above procedures procedure well without immediate postprocedural complication. FINDINGS: Nodule reference number based on prior diagnostic ultrasound: 2 Maximum size: 3.2 cm Location: Isthmus; Inferior ACR TI-RADS risk category: TR3 (3 points) Reason for biopsy: meets ACR TI-RADS criteria  _________________________________________________________ Nodule reference number based on prior diagnostic ultrasound: 4 Maximum size: 2.7 cm Location: Left; Mid ACR TI-RADS risk category: TR3 (3 points) Reason for biopsy: meets ACR TI-RADS criteria Ultrasound imaging confirms appropriate placement of the needles within the thyroid nodule. IMPRESSION: 1. Technically successful ultrasound guided fine needle aspiration of inferior isthmus thyroid nodule 2. Technically successful ultrasound guided fine needle aspiration of left mid thyroid nodule Read by: Alex Gardener, AGNP-BC Electronically Signed   By: Marliss Coots M.D.   On: 10/07/2022 16:26       Assessment & Plan:  Thyroid fullness Assessment & Plan: Thyroid ultrasound - reveals changes that appear to be consistent with multinodular goiter. Saw endocrinology - recommended FNA. Molecular testing of the thyroid - no evidence of cancer. Recommended starting methimazole.  Taking and tolerating.  Continue f/u with endocrinology.    Stage 3a chronic kidney disease (HCC) Assessment & Plan: Avoid antiinflammatories.  Stay hydrated.  Follow metabolic panel.     Pure hypercholesterolemia Assessment & Plan: On tricor.  Follow lipid panel.  Will need to see if can add to labs through cancer center.    Normocytic anemia Assessment & Plan: Followed by hematology.    Multiple myeloma in remission [facility]) Assessment & Plan: Being followed by hematology for treatment - multiple myeloma. Treated with Daratumumab and revilimid. S/p bone marro biopsy - 3% plasma cell, no M protein.  In CR. Continues aspirin daily.    History of dementia Assessment & Plan: On aricept and namenda.  Follow up with neurology.    History of colon polyps Assessment & Plan: Has had colonoscopy.  States was told did not need f/u colonoscopy.     History of anemia Assessment & Plan: Being followed by hematology.  hgb stable.    Essential (primary)  hypertension Assessment & Plan: Blood pressure on check here ok.  Reviewed outside blood pressures.  Will have them to continue to monitor.  If persistent elevation, will increase diovan to 320mg  q day.  Follow pressures.  Follow metabolic panel.    Dementia without behavioral disturbance, psychotic disturbance, mood disturbance, or anxiety,  unspecified dementia severity, unspecified dementia type [facility]) Assessment & Plan: On aricept and namenda.  Continue f/u with neurology.    Other orders -     amLODIPine Besylate; Take 2 tablets (5 mg total) by mouth daily.  Dispense: 180 tablet; Refill: 2 -     Fenofibrate; TAKE 1 TABLET(48 MG) BY MOUTH DAILY  Dispense: 90 tablet; Refill: 2 -     hydroCHLOROthiazide; TAKE 1/2 TABLET BY MOUTH EVERY DAY  Dispense: 45 tablet; Refill: 1 -     Metoprolol Succinate ER; TAKE 2 TABLETS(50 MG) BY MOUTH DAILY  Dispense: 180 tablet; Refill: 1 -     Omeprazole; TAKE 1 CAPSULE(20 MG) BY MOUTH DAILY  Dispense: 90 capsule; Refill: 3 -     Valsartan; Take 1 tablet (160 mg total) by mouth daily.  Dispense: 90 tablet; Refill: 1     Dale Mascot, MD

## 2022-11-21 ENCOUNTER — Other Ambulatory Visit: Payer: Self-pay

## 2022-11-21 LAB — TRAB (TSH RECEPTOR BINDING ANTIBODY): TRAB: <1 IU/L (ref <=2.00)

## 2022-11-23 ENCOUNTER — Encounter: Payer: Self-pay | Admitting: Internal Medicine

## 2022-11-23 NOTE — Assessment & Plan Note (Signed)
Avoid antiinflammatories.  Stay hydrated.  Follow metabolic panel.   

## 2022-11-23 NOTE — Assessment & Plan Note (Signed)
Being followed by hematology.  hgb stable.  ?

## 2022-11-23 NOTE — Assessment & Plan Note (Signed)
Has had colonoscopy.  States was told did not need f/u colonoscopy.   

## 2022-11-23 NOTE — Assessment & Plan Note (Signed)
Thyroid ultrasound - reveals changes that appear to be consistent with multinodular goiter. Saw endocrinology - recommended FNA. Molecular testing of the thyroid - no evidence of cancer. Recommended starting methimazole.  Taking and tolerating.  Continue f/u with endocrinology.

## 2022-11-23 NOTE — Assessment & Plan Note (Signed)
Being followed by hematology for treatment - multiple myeloma. Treated with Daratumumab and revilimid. S/p bone marro biopsy - 3% plasma cell, no M protein.  In CR. Continues aspirin daily.  

## 2022-11-23 NOTE — Assessment & Plan Note (Signed)
Blood pressure on check here ok.  Reviewed outside blood pressures.  Will have them to continue to monitor.  If persistent elevation, will increase diovan to 320mg  q day.  Follow pressures.  Follow metabolic panel.

## 2022-11-23 NOTE — Assessment & Plan Note (Signed)
On aricept and namenda.  Continue f/u with neurology.  

## 2022-11-23 NOTE — Assessment & Plan Note (Signed)
On tricor.  Follow lipid panel.  Will need to see if can add to labs through cancer center.  

## 2022-11-23 NOTE — Assessment & Plan Note (Signed)
On aricept and namenda.  Follow up with neurology.  

## 2022-11-23 NOTE — Assessment & Plan Note (Signed)
Followed by hematology 

## 2022-11-25 ENCOUNTER — Encounter: Payer: Self-pay | Admitting: Oncology

## 2022-11-25 ENCOUNTER — Inpatient Hospital Stay: Payer: Medicare Other

## 2022-11-25 ENCOUNTER — Inpatient Hospital Stay (HOSPITAL_BASED_OUTPATIENT_CLINIC_OR_DEPARTMENT_OTHER): Payer: Medicare Other | Admitting: Oncology

## 2022-11-25 VITALS — BP 141/68 | HR 61 | Temp 97.8°F | Resp 18 | Wt 144.3 lb

## 2022-11-25 DIAGNOSIS — Z8249 Family history of ischemic heart disease and other diseases of the circulatory system: Secondary | ICD-10-CM | POA: Diagnosis not present

## 2022-11-25 DIAGNOSIS — K219 Gastro-esophageal reflux disease without esophagitis: Secondary | ICD-10-CM | POA: Diagnosis not present

## 2022-11-25 DIAGNOSIS — Z8261 Family history of arthritis: Secondary | ICD-10-CM | POA: Diagnosis not present

## 2022-11-25 DIAGNOSIS — I129 Hypertensive chronic kidney disease with stage 1 through stage 4 chronic kidney disease, or unspecified chronic kidney disease: Secondary | ICD-10-CM | POA: Diagnosis not present

## 2022-11-25 DIAGNOSIS — Z7982 Long term (current) use of aspirin: Secondary | ICD-10-CM | POA: Diagnosis not present

## 2022-11-25 DIAGNOSIS — C9000 Multiple myeloma not having achieved remission: Secondary | ICD-10-CM

## 2022-11-25 DIAGNOSIS — Z5112 Encounter for antineoplastic immunotherapy: Secondary | ICD-10-CM | POA: Diagnosis not present

## 2022-11-25 DIAGNOSIS — D649 Anemia, unspecified: Secondary | ICD-10-CM

## 2022-11-25 DIAGNOSIS — N1831 Chronic kidney disease, stage 3a: Secondary | ICD-10-CM | POA: Diagnosis not present

## 2022-11-25 DIAGNOSIS — C9001 Multiple myeloma in remission: Secondary | ICD-10-CM

## 2022-11-25 DIAGNOSIS — Z823 Family history of stroke: Secondary | ICD-10-CM | POA: Diagnosis not present

## 2022-11-25 DIAGNOSIS — M899 Disorder of bone, unspecified: Secondary | ICD-10-CM | POA: Diagnosis not present

## 2022-11-25 DIAGNOSIS — Z79624 Long term (current) use of inhibitors of nucleotide synthesis: Secondary | ICD-10-CM | POA: Diagnosis not present

## 2022-11-25 DIAGNOSIS — Z5111 Encounter for antineoplastic chemotherapy: Secondary | ICD-10-CM

## 2022-11-25 DIAGNOSIS — Z79899 Other long term (current) drug therapy: Secondary | ICD-10-CM | POA: Diagnosis not present

## 2022-11-25 DIAGNOSIS — M549 Dorsalgia, unspecified: Secondary | ICD-10-CM | POA: Diagnosis not present

## 2022-11-25 DIAGNOSIS — E7800 Pure hypercholesterolemia, unspecified: Secondary | ICD-10-CM | POA: Diagnosis not present

## 2022-11-25 DIAGNOSIS — R5383 Other fatigue: Secondary | ICD-10-CM | POA: Diagnosis not present

## 2022-11-25 DIAGNOSIS — Z803 Family history of malignant neoplasm of breast: Secondary | ICD-10-CM | POA: Diagnosis not present

## 2022-11-25 LAB — CBC WITH DIFFERENTIAL/PLATELET
Abs Immature Granulocytes: 0.02 10*3/uL (ref 0.00–0.07)
Basophils Absolute: 0.1 10*3/uL (ref 0.0–0.1)
Basophils Relative: 1 %
Eosinophils Absolute: 0 10*3/uL (ref 0.0–0.5)
Eosinophils Relative: 0 %
HCT: 32.4 % — ABNORMAL LOW (ref 36.0–46.0)
Hemoglobin: 10.5 g/dL — ABNORMAL LOW (ref 12.0–15.0)
Immature Granulocytes: 0 %
Lymphocytes Relative: 21 %
Lymphs Abs: 1.3 10*3/uL (ref 0.7–4.0)
MCH: 32.3 pg (ref 26.0–34.0)
MCHC: 32.4 g/dL (ref 30.0–36.0)
MCV: 99.7 fL (ref 80.0–100.0)
Monocytes Absolute: 0.1 10*3/uL (ref 0.1–1.0)
Monocytes Relative: 1 %
Neutro Abs: 4.6 10*3/uL (ref 1.7–7.7)
Neutrophils Relative %: 77 %
Platelets: 228 10*3/uL (ref 150–400)
RBC: 3.25 MIL/uL — ABNORMAL LOW (ref 3.87–5.11)
RDW: 12.8 % (ref 11.5–15.5)
WBC: 6 10*3/uL (ref 4.0–10.5)
nRBC: 0 % (ref 0.0–0.2)

## 2022-11-25 LAB — COMPREHENSIVE METABOLIC PANEL
ALT: 17 U/L (ref 0–44)
AST: 27 U/L (ref 15–41)
Albumin: 3.8 g/dL (ref 3.5–5.0)
Alkaline Phosphatase: 50 U/L (ref 38–126)
Anion gap: 8 (ref 5–15)
BUN: 20 mg/dL (ref 8–23)
CO2: 25 mmol/L (ref 22–32)
Calcium: 9.4 mg/dL (ref 8.9–10.3)
Chloride: 104 mmol/L (ref 98–111)
Creatinine, Ser: 1.03 mg/dL — ABNORMAL HIGH (ref 0.44–1.00)
GFR, Estimated: 53 mL/min — ABNORMAL LOW (ref >60)
Glucose, Bld: 103 mg/dL — ABNORMAL HIGH (ref 70–99)
Potassium: 3.9 mmol/L (ref 3.5–5.1)
Sodium: 137 mmol/L (ref 135–145)
Total Bilirubin: 0.3 mg/dL (ref 0.3–1.2)
Total Protein: 7 g/dL (ref 6.5–8.1)

## 2022-11-25 MED ORDER — DIPHENHYDRAMINE HCL 25 MG PO CAPS
50.0000 mg | ORAL_CAPSULE | Freq: Once | ORAL | Status: AC
  Administered 2022-11-25: 50 mg via ORAL
  Filled 2022-11-25: qty 2

## 2022-11-25 MED ORDER — ACETAMINOPHEN 325 MG PO TABS
650.0000 mg | ORAL_TABLET | Freq: Once | ORAL | Status: AC
  Administered 2022-11-25: 650 mg via ORAL
  Filled 2022-11-25: qty 2

## 2022-11-25 MED ORDER — DARATUMUMAB-HYALURONIDASE-FIHJ 1800-30000 MG-UT/15ML ~~LOC~~ SOLN
1800.0000 mg | Freq: Once | SUBCUTANEOUS | Status: AC
  Administered 2022-11-25: 1800 mg via SUBCUTANEOUS
  Filled 2022-11-25: qty 15

## 2022-11-25 NOTE — Progress Notes (Signed)
Pt here for follow up. No new concerns voiced.   

## 2022-11-25 NOTE — Assessment & Plan Note (Signed)
recommend patient to avoid nephrotoxin.  Encourage oral hydration. 

## 2022-11-25 NOTE — Assessment & Plan Note (Signed)
Continue Q3 months Zometa- today, and next due July  2024 Recommend calcium 1200 mg daily. 

## 2022-11-25 NOTE — Assessment & Plan Note (Signed)
Multiple myeloma, IgA kappa, complex hyperploid gain of 1q, S/p Daratumumab Rd.   Revlimid D1-21,  bone marrow biopsy results were reviewed and discussed with patient - 3% plasma cell, no M protein. She is in [facility]  Labs are reviewed and discussed with patient.  Proceed with  Daratumumab monotherapy maintenance - she takes home supply of Dexamethasone prior to her Daratumumab.  Continue asprin 81mg daily and Acyclovir 400mg BID.  

## 2022-11-25 NOTE — Assessment & Plan Note (Signed)
Chemotherapy plan as listed as above. 

## 2022-11-25 NOTE — Assessment & Plan Note (Signed)
Stable. Continue monitor 

## 2022-11-25 NOTE — Progress Notes (Signed)
Hematology/Oncology Progress note Telephone:(336) [phone] Fax:([phone]     CHIEF COMPLAINTS/REASON FOR VISIT:  Follow-up for multiple myeloma  ASSESSMENT & PLAN:   Cancer Staging  Multiple myeloma (HCC) Staging form: Plasma Cell Myeloma and Plasma Cell Disorders, AJCC 8th Edition - Clinical stage from 08/22/2021: RISS Stage II (Beta-2-microglobulin (mg/L): 4.1, Albumin (g/dL): 2.8, ISS: Stage II, High-risk cytogenetics: Absent, LDH: Normal) - Signed by Rickard Patience, MD on 09/06/2021   Multiple myeloma (HCC) Multiple myeloma, IgA kappa, complex hyperploid gain of 1q, S/p Daratumumab Rd.   Revlimid D1-21,  bone marrow biopsy results were reviewed and discussed with patient - 3% plasma cell, no M protein. She is in [facility] are reviewed and discussed with patient.  Proceed with  Daratumumab monotherapy maintenance - she takes home supply of Dexamethasone prior to her Daratumumab.  Continue asprin 81mg  daily and Acyclovir 400mg  BID.   Bone lesion Continue Q3 months Zometa- today, and next due July  2024 Recommend calcium 1200 mg daily.  Normocytic anemia Stable Continue monitor.   Stage 3a chronic kidney disease (HCC) recommend patient to avoid nephrotoxin.  Encourage oral hydration.  Encounter for antineoplastic chemotherapy Chemotherapy plan as listed as above.  Orders Placed This Encounter  Procedures   Kappa/lambda light chains    Standing Status:   Future    Standing Expiration Date:   01/20/2024   Multiple Myeloma Panel (SPEP&IFE w/QIG)    Standing Status:   Future    Standing Expiration Date:   01/20/2024   Comprehensive metabolic panel    Standing Status:   Future    Standing Expiration Date:   01/20/2024   CBC with Differential    Standing Status:   Future    Standing Expiration Date:   01/20/2024     Follow up  Lab/MD/Dara in 4 weeks  All questions were answered. The patient knows to call the clinic with any problems, questions or concerns.  Rickard Patience, MD,  PhD [facility] Hematology Oncology 11/25/2022     HISTORY OF PRESENTING ILLNESS:   Stephanie Delacruz is a  89 y.o.  female presents for follow up of multiple myeloma.  Oncology History  Multiple myeloma (HCC)  08/22/2021 Initial Diagnosis   Multiple myeloma (HCC) IgA kappa Initial M protein 5.2, free kappa light chain ratio 89.2, Kappa/lambda ratio 13.31   08/22/2021 Cancer Staging   Staging form: Plasma Cell Myeloma and Plasma Cell Disorders, AJCC 8th Edition - Clinical stage from 08/22/2021: RISS Stage II (Beta-2-microglobulin (mg/L): 4.1, Albumin (g/dL): 2.8, ISS: Stage II, High-risk cytogenetics: Absent, LDH: Normal) - Signed by Rickard Patience, MD on 09/06/2021 Stage prefix: Initial diagnosis Beta 2 microglobulin range (mg/L): 3.5 to 5.49 Albumin range (g/dL): Less than 3.5 Cytogenetics: 1q addition   08/26/2021 - 09/01/2022 Chemotherapy   08/26/21 MYELOMA Daratumumab SQ q28d  09/06/2021 added Revlimid 10mg  x 14 days.  09/27/2021 starting cycle 2 Revlimid 10 mg 21 days on, 7 days off. Weekly Dexamethasone.    08/26/2021 Bone Marrow Biopsy   Bone marrow biopsy showed hypercellular marrow with plasma cell neoplasm. 67% plasma cells in the aspirate. Cytogenetics showed complex hyperplasia, gain of 1 q.    07/23/2022 Bone Marrow Biopsy   Bone marrow biopsy showed Hypercellular bone marrow for age with trilineage hematopoiesis and 3% plasma cells  Cytogenetics are normal.     INTERVAL HISTORY Stephanie Delacruz is a 89 y.o. female who has above history reviewed by me today presents for follow up visit for management of  multiple myeloma She was accompanied by her daughter today.  She denies any nausea vomiting diarrhea.   No new complaints.  S/p thyroid lesion biopsy, obtained by endocrinologist. + AUS   Review of Systems  Constitutional:  Positive for fatigue. Negative for appetite change, chills and fever.  HENT:   Negative for hearing loss and voice change.   Eyes:  Negative for eye problems.   Respiratory:  Negative for chest tightness and cough.   Cardiovascular:  Negative for chest pain.  Gastrointestinal:  Negative for abdominal distention, abdominal pain and blood in stool.  Endocrine: Negative for hot flashes.  Genitourinary:  Negative for difficulty urinating and frequency.   Musculoskeletal:  Positive for back pain. Negative for arthralgias.  Skin:  Negative for itching and rash.  Neurological:  Negative for extremity weakness.  Hematological:  Negative for adenopathy.  Psychiatric/Behavioral:  Negative for confusion.     MEDICAL HISTORY:  Past Medical History:  Diagnosis Date   Anemia    GERD (gastroesophageal reflux disease)    Hypercholesterolemia    Hypertension    Renal insufficiency     SURGICAL HISTORY: Past Surgical History:  Procedure Laterality Date   TONSILLECTOMY     TUBAL LIGATION      SOCIAL HISTORY: Social History   Socioeconomic History   Marital status: Married    Spouse name: Not on file   Number of children: Not on file   Years of education: Not on file   Highest education level: Not on file  Occupational History   Not on file  Tobacco Use   Smoking status: Never   Smokeless tobacco: Never  Vaping Use   Vaping Use: Never used  Substance and Sexual Activity   Alcohol use: No   Drug use: No   Sexual activity: Not on file  Other Topics Concern   Not on file  Social History Narrative   Lives with husband Fayrene Fearing and daughter Marylene Land. No pets.    Social Determinants of Health   Financial Resource Strain: Not on file  Food Insecurity: Not on file  Transportation Needs: Not on file  Physical Activity: Not on file  Stress: Not on file  Social Connections: Not on file  Intimate Partner Violence: Not on file    FAMILY HISTORY: Family History  Problem Relation Age of Onset   Hypertension Mother    Arthritis Mother    Hypertension Father    Stroke Father    Breast cancer Niece     ALLERGIES:  has No Known  Allergies.  MEDICATIONS:  Current Outpatient Medications  Medication Sig Dispense Refill   acyclovir (ZOVIRAX) 400 MG tablet TAKE 1 TABLET(400 MG) BY MOUTH TWICE DAILY 60 tablet 11   amLODipine (NORVASC) 2.5 MG tablet Take 2 tablets (5 mg total) by mouth daily. 180 tablet 2   ascorbic acid (VITAMIN C) 250 MG tablet daily Take 2 tablets every day by oral route.     aspirin 81 MG EC tablet Take 81 mg by mouth daily.     Calcium Carb-Cholecalciferol (CALCIUM 600+D) 600-20 MG-MCG TABS Take 1 tablet by mouth 3 (three) times daily.     dexamethasone (DECADRON) 4 MG tablet Take 5 tablets (20 mg total) by mouth once a week. 60 tablet 0   donepezil (ARICEPT) 10 MG tablet TAKE 1 TABLET(10 MG) BY MOUTH DAILY 90 tablet 2   fenofibrate (TRICOR) 48 MG tablet TAKE 1 TABLET(48 MG) BY MOUTH DAILY 90 tablet 2   fexofenadine (ALLEGRA) 180 MG  tablet Take by mouth.     hydrochlorothiazide (HYDRODIURIL) 25 MG tablet TAKE 1/2 TABLET BY MOUTH EVERY DAY 45 tablet 1   memantine (NAMENDA) 5 MG tablet Take 5 mg by mouth 2 (two) times daily.     methimazole (TAPAZOLE) 5 MG tablet Take 1 tablet (5 mg total) by mouth daily. 90 tablet 2   metoprolol succinate (TOPROL-XL) 25 MG 24 hr tablet TAKE 2 TABLETS(50 MG) BY MOUTH DAILY 180 tablet 1   montelukast (SINGULAIR) 10 MG tablet Take 1 tablet (10 mg total) by mouth See admin instructions. Take 1 tablet one day before Daratumumab injection. 60 tablet 0   Multiple Vitamins-Minerals (WOMENS MULTIVITAMIN PO) Take 1 tablet by mouth daily.     omeprazole (PRILOSEC) 20 MG capsule TAKE 1 CAPSULE(20 MG) BY MOUTH DAILY 90 capsule 3   prochlorperazine (COMPAZINE) 10 MG tablet Take 10 mg by mouth every 6 (six) hours as needed for nausea or vomiting.     valsartan (DIOVAN) 160 MG tablet Take 1 tablet (160 mg total) by mouth daily. 90 tablet 1   No current facility-administered medications for this visit.     PHYSICAL EXAMINATION: ECOG PERFORMANCE STATUS: 1 - Symptomatic but  completely ambulatory Vitals:   11/25/22 1353  BP: (!) 141/68  Pulse: 61  Resp: 18  Temp: 97.8 F (36.6 C)   Filed Weights   11/25/22 1353  Weight: 144 lb 4.8 oz (65.5 kg)    Physical Exam Constitutional:      General: She is not in acute distress. HENT:     Head: Normocephalic and atraumatic.  Eyes:     General: No scleral icterus. Cardiovascular:     Rate and Rhythm: Normal rate and regular rhythm.     Heart sounds: Normal heart sounds.  Pulmonary:     Effort: Pulmonary effort is normal. No respiratory distress.     Breath sounds: No wheezing.  Abdominal:     General: Bowel sounds are normal. There is no distension.     Palpations: Abdomen is soft.  Musculoskeletal:        General: No deformity. Normal range of motion.     Cervical back: Normal range of motion and neck supple.  Skin:    General: Skin is warm and dry.     Findings: No erythema or rash.  Neurological:     Mental Status: She is alert and oriented to person, place, and time. Mental status is at baseline.     Cranial Nerves: No cranial nerve deficit.     Coordination: Coordination normal.  Psychiatric:        Mood and Affect: Mood normal.     LABORATORY DATA:  I have reviewed the data as listed    Latest Ref Rng & Units 11/25/2022    1:17 PM 10/28/2022    8:44 AM 09/30/2022    8:39 AM  CBC  WBC 4.0 - 10.5 K/uL 6.0  4.7  4.6   Hemoglobin 12.0 - 15.0 g/dL 16.1  09.6  04.5   Hematocrit 36.0 - 46.0 % 32.4  31.9  31.2   Platelets 150 - 400 K/uL 228  189  178       Latest Ref Rng & Units 11/25/2022    1:17 PM 10/28/2022    8:44 AM 09/30/2022    8:39 AM  CMP  Glucose 70 - 99 mg/dL 409  86  86   BUN 8 - 23 mg/dL 20  23  15    Creatinine 0.44 -  1.00 mg/dL 1.61  0.96  0.45   Sodium 135 - 145 mmol/L 137  142  143   Potassium 3.5 - 5.1 mmol/L 3.9  3.6  3.7   Chloride 98 - 111 mmol/L 104  109  108   CO2 22 - 32 mmol/L 25  25  25    Calcium 8.9 - 10.3 mg/dL 9.4  9.5  9.1   Total Protein 6.5 - 8.1 g/dL 7.0   6.4  6.3   Total Bilirubin 0.3 - 1.2 mg/dL 0.3  0.3  0.4   Alkaline Phos 38 - 126 U/L 50  49  46   AST 15 - 41 U/L 27  29  28    ALT 0 - 44 U/L 17  20  17       Iron/TIBC/Ferritin/ %Sat    Component Value Date/Time   IRON 54 07/24/2021 1212   TIBC 277 07/24/2021 1212   FERRITIN 93 07/24/2021 1212   IRONPCTSAT 20 07/24/2021 1212       RADIOGRAPHIC STUDIES: I have personally reviewed the radiological images as listed and agreed with the findings in the report. [place] FNA BX THYROID 1ST LESION AFIRMA  Result Date: 10/07/2022 INDICATION: Indeterminate thyroid nodule EXAM: ULTRASOUND GUIDED FINE NEEDLE ASPIRATION OF INDETERMINATE THYROID NODULE x 2 COMPARISON:  Ultrasound thyroid study dated 08/27/2022 MEDICATIONS: 1 % lidocaine COMPLICATIONS: None immediate. TECHNIQUE: Informed written consent was obtained from the patient after a discussion of the risks, benefits and alternatives to treatment. Questions regarding the procedure were encouraged and answered. A timeout was performed prior to the initiation of the procedure. Pre-procedural ultrasound scanning demonstrated unchanged size and appearance of the indeterminate nodules within the inferior isthmus The procedure was planned. The neck was prepped in the usual sterile fashion, and a sterile drape was applied covering the operative field. A timeout was performed prior to the initiation of the procedure. Local anesthesia was provided with 1% lidocaine. Under direct ultrasound guidance, 6 FNA biopsies were performed of the inferior isthmus nodule with a 25 gauge needle. One of those were obtained for [facility]. Multiple ultrasound images were saved for procedural documentation purposes. The samples were prepared and submitted to pathology. Under direct ultrasound guidance, 5 FNA biopsies were performed of the mid left thyroid nodule with a 25 gauge needle. 2 of those were obtained for [facility]. Multiple ultrasound images were saved for procedural  documentation purposes. The samples were prepared and submitted to pathology. Limited post procedural scanning was negative for hematoma or additional complication. Dressings were placed. The patient tolerated the above procedures procedure well without immediate postprocedural complication. FINDINGS: Nodule reference number based on prior diagnostic ultrasound: 2 Maximum size: 3.2 cm Location: Isthmus; Inferior ACR TI-RADS risk category: TR3 (3 points) Reason for biopsy: meets ACR TI-RADS criteria _________________________________________________________ Nodule reference number based on prior diagnostic ultrasound: 4 Maximum size: 2.7 cm Location: Left; Mid ACR TI-RADS risk category: TR3 (3 points) Reason for biopsy: meets ACR TI-RADS criteria Ultrasound imaging confirms appropriate placement of the needles within the thyroid nodule. IMPRESSION: 1. Technically successful ultrasound guided fine needle aspiration of inferior isthmus thyroid nodule 2. Technically successful ultrasound guided fine needle aspiration of left mid thyroid nodule Read by: Alex Gardener, AGNP-BC Electronically Signed   By: Marliss Coots M.D.   On: 10/07/2022 16:53   [place] FNA BIOPSY THYROID EA ADD LESION AFIRMA  Result Date: 10/07/2022 INDICATION: Indeterminate thyroid nodule EXAM: ULTRASOUND GUIDED FINE NEEDLE ASPIRATION OF INDETERMINATE THYROID NODULE x 2 COMPARISON:  Ultrasound thyroid study dated  08/27/2022 MEDICATIONS: 1 % lidocaine COMPLICATIONS: None immediate. TECHNIQUE: Informed written consent was obtained from the patient after a discussion of the risks, benefits and alternatives to treatment. Questions regarding the procedure were encouraged and answered. A timeout was performed prior to the initiation of the procedure. Pre-procedural ultrasound scanning demonstrated unchanged size and appearance of the indeterminate nodules within the inferior isthmus The procedure was planned. The neck was prepped in the usual sterile fashion,  and a sterile drape was applied covering the operative field. A timeout was performed prior to the initiation of the procedure. Local anesthesia was provided with 1% lidocaine. Under direct ultrasound guidance, 6 FNA biopsies were performed of the inferior isthmus nodule with a 25 gauge needle. One of those were obtained for [facility]. Multiple ultrasound images were saved for procedural documentation purposes. The samples were prepared and submitted to pathology. Under direct ultrasound guidance, 5 FNA biopsies were performed of the mid left thyroid nodule with a 25 gauge needle. 2 of those were obtained for [facility]. Multiple ultrasound images were saved for procedural documentation purposes. The samples were prepared and submitted to pathology. Limited post procedural scanning was negative for hematoma or additional complication. Dressings were placed. The patient tolerated the above procedures procedure well without immediate postprocedural complication. FINDINGS: Nodule reference number based on prior diagnostic ultrasound: 2 Maximum size: 3.2 cm Location: Isthmus; Inferior ACR TI-RADS risk category: TR3 (3 points) Reason for biopsy: meets ACR TI-RADS criteria _________________________________________________________ Nodule reference number based on prior diagnostic ultrasound: 4 Maximum size: 2.7 cm Location: Left; Mid ACR TI-RADS risk category: TR3 (3 points) Reason for biopsy: meets ACR TI-RADS criteria Ultrasound imaging confirms appropriate placement of the needles within the thyroid nodule. IMPRESSION: 1. Technically successful ultrasound guided fine needle aspiration of inferior isthmus thyroid nodule 2. Technically successful ultrasound guided fine needle aspiration of left mid thyroid nodule Read by: Alex Gardener, AGNP-BC Electronically Signed   By: Marliss Coots M.D.   On: 10/07/2022 16:26

## 2022-11-25 NOTE — Patient Instructions (Signed)
[facility]  Discharge Instructions: Thank you for choosing [facility] to provide your oncology and hematology care.  If you have a lab appointment with the Cancer Center, please go directly to the Cancer Center and check in at the registration area.  Wear comfortable clothing and clothing appropriate for easy access to any Portacath or PICC line.   We strive to give you quality time with your provider. You may need to reschedule your appointment if you arrive late (15 or more minutes).  Arriving late affects you and other patients whose appointments are after yours.  Also, if you miss three or more appointments without notifying the office, you may be dismissed from the clinic at the provider's discretion.      For prescription refill requests, have your pharmacy contact our office and allow 72 hours for refills to be completed.    Today you received the following chemotherapy and/or immunotherapy agents Darzalex      To help prevent nausea and vomiting after your treatment, we encourage you to take your nausea medication as directed.  BELOW ARE SYMPTOMS THAT SHOULD BE REPORTED IMMEDIATELY: *FEVER GREATER THAN 100.4 F (38 C) OR HIGHER *CHILLS OR SWEATING *NAUSEA AND VOMITING THAT IS NOT CONTROLLED WITH YOUR NAUSEA MEDICATION *UNUSUAL SHORTNESS OF BREATH *UNUSUAL BRUISING OR BLEEDING *URINARY PROBLEMS (pain or burning when urinating, or frequent urination) *BOWEL PROBLEMS (unusual diarrhea, constipation, pain near the anus) TENDERNESS IN MOUTH AND THROAT WITH OR WITHOUT PRESENCE OF ULCERS (sore throat, sores in mouth, or a toothache) UNUSUAL RASH, SWELLING OR PAIN  UNUSUAL VAGINAL DISCHARGE OR ITCHING   Items with * indicate a potential emergency and should be followed up as soon as possible or go to the Emergency Department if any problems should occur.  Please show the CHEMOTHERAPY ALERT CARD or IMMUNOTHERAPY ALERT CARD at check-in to  the Emergency Department and triage nurse.  Should you have questions after your visit or need to cancel or reschedule your appointment, please contact [facility] [facility]  [phone] and follow the prompts.  Office hours are 8:00 a.m. to 4:30 p.m. Monday - Friday. Please note that voicemails left after 4:00 p.m. may not be returned until the following business day.  We are closed weekends and major holidays. You have access to a nurse at all times for urgent questions. Please call the main number to the clinic [phone] and follow the prompts.  For any non-urgent questions, you may also contact your provider using MyChart. We now offer e-Visits for anyone 18 and older to request care online for non-urgent symptoms. For details visit [email or web address].   Also download the MyChart app! Go to the app store, search "MyChart", open the app, select [facility], and log in with your MyChart username and password.      

## 2022-11-26 LAB — KAPPA/LAMBDA LIGHT CHAINS
Kappa free light chain: 10.2 mg/L (ref 3.3–19.4)
Kappa, lambda light chain ratio: 2.49 — ABNORMAL HIGH (ref 0.26–1.65)
Lambda free light chains: 4.1 mg/L — ABNORMAL LOW (ref 5.7–26.3)

## 2022-12-01 LAB — MULTIPLE MYELOMA PANEL, SERUM
Albumin SerPl Elph-Mcnc: 3.5 g/dL (ref 2.9–4.4)
Albumin/Glob SerPl: 1.3 (ref 0.7–1.7)
Alpha 1: 0.3 g/dL (ref 0.0–0.4)
Alpha2 Glob SerPl Elph-Mcnc: 0.8 g/dL (ref 0.4–1.0)
B-Globulin SerPl Elph-Mcnc: 1.3 g/dL (ref 0.7–1.3)
Gamma Glob SerPl Elph-Mcnc: 0.5 g/dL (ref 0.4–1.8)
Globulin, Total: 2.8 g/dL (ref 2.2–3.9)
IgA: 285 mg/dL (ref 64–422)
IgG (Immunoglobin G), Serum: 551 mg/dL — ABNORMAL LOW (ref 586–1602)
IgM (Immunoglobulin M), Srm: 15 mg/dL — ABNORMAL LOW (ref 26–217)
M Protein SerPl Elph-Mcnc: 0.5 g/dL — ABNORMAL HIGH
Total Protein ELP: 6.3 g/dL (ref 6.0–8.5)

## 2022-12-06 ENCOUNTER — Encounter: Payer: Self-pay | Admitting: Oncology

## 2022-12-12 ENCOUNTER — Encounter (INDEPENDENT_AMBULATORY_CARE_PROVIDER_SITE_OTHER): Payer: Self-pay | Admitting: Nurse Practitioner

## 2022-12-12 ENCOUNTER — Ambulatory Visit (INDEPENDENT_AMBULATORY_CARE_PROVIDER_SITE_OTHER): Payer: Medicare Other | Admitting: Nurse Practitioner

## 2022-12-12 VITALS — BP 150/72 | HR 57 | Resp 18 | Ht 61.5 in | Wt 147.2 lb

## 2022-12-12 DIAGNOSIS — I10 Essential (primary) hypertension: Secondary | ICD-10-CM

## 2022-12-12 DIAGNOSIS — I890 Lymphedema, not elsewhere classified: Secondary | ICD-10-CM | POA: Diagnosis not present

## 2022-12-12 DIAGNOSIS — N1831 Chronic kidney disease, stage 3a: Secondary | ICD-10-CM

## 2022-12-12 NOTE — Progress Notes (Signed)
Subjective:    Patient ID: Stephanie Delacruz, female    DOB: 06-07-1934, 89 y.o.   MRN: [id number] Chief Complaint  Patient presents with   Follow-up    f/u in 6 months with no studies    The patient returns to the office for followup evaluation regarding leg swelling.  The swelling has improved quite a bit and the pain associated with swelling has decreased substantially. There have not been any interval development of a ulcerations or wounds.  Since the previous visit the patient has been wearing graduated compression stockings and has noted improvement in the lymphedema. The patient has been using compression routinely morning until night.  The patient also states elevation during the day and exercise (such as walking) is being done too.  Overall she has been progressing very well.       Review of Systems  Cardiovascular:  Positive for leg swelling.  All other systems reviewed and are negative.      Objective:   Physical Exam Vitals reviewed.  HENT:     Head: Normocephalic.  Cardiovascular:     Rate and Rhythm: Normal rate.  Pulmonary:     Effort: Pulmonary effort is normal.  Musculoskeletal:     Left lower leg: Edema present.  Skin:    General: Skin is warm and dry.  Neurological:     Mental Status: She is alert and oriented to person, place, and time.  Psychiatric:        Mood and Affect: Mood normal.        Behavior: Behavior normal.        Thought Content: Thought content normal.        Judgment: Judgment normal.     BP (!) 150/72 (BP Location: Left Arm)   Pulse (!) 57   Resp 18   Ht 5' 1.5" (1.562 m)   Wt 147 lb 3.2 oz (66.8 kg)   BMI 27.36 kg/m   Past Medical History:  Diagnosis Date   Anemia    GERD (gastroesophageal reflux disease)    Hypercholesterolemia    Hypertension    Renal insufficiency     Social History   Socioeconomic History   Marital status: Married    Spouse name: Not on file   Number of children: Not on file   Years of  education: Not on file   Highest education level: Not on file  Occupational History   Not on file  Tobacco Use   Smoking status: Never   Smokeless tobacco: Never  Vaping Use   Vaping Use: Never used  Substance and Sexual Activity   Alcohol use: No   Drug use: No   Sexual activity: Not on file  Other Topics Concern   Not on file  Social History Narrative   Lives with husband Fayrene Fearing and daughter Marylene Land. No pets.    Social Determinants of Health   Financial Resource Strain: Not on file  Food Insecurity: Not on file  Transportation Needs: Not on file  Physical Activity: Not on file  Stress: Not on file  Social Connections: Not on file  Intimate Partner Violence: Not on file    Past Surgical History:  Procedure Laterality Date   TONSILLECTOMY     TUBAL LIGATION      Family History  Problem Relation Age of Onset   Hypertension Mother    Arthritis Mother    Hypertension Father    Stroke Father    Breast cancer Niece  No Known Allergies     Latest Ref Rng & Units 11/25/2022    1:17 PM 10/28/2022    8:44 AM 09/30/2022    8:39 AM  CBC  WBC 4.0 - 10.5 K/uL 6.0  4.7  4.6   Hemoglobin 12.0 - 15.0 g/dL 86.5  78.4  69.6   Hematocrit 36.0 - 46.0 % 32.4  31.9  31.2   Platelets 150 - 400 K/uL 228  189  178       CMP     Component Value Date/Time   NA 137 11/25/2022 1317   K 3.9 11/25/2022 1317   CL 104 11/25/2022 1317   CO2 25 11/25/2022 1317   GLUCOSE 103 (H) 11/25/2022 1317   BUN 20 11/25/2022 1317   CREATININE 1.03 (H) 11/25/2022 1317   CALCIUM 9.4 11/25/2022 1317   PROT 7.0 11/25/2022 1317   ALBUMIN 3.8 11/25/2022 1317   AST 27 11/25/2022 1317   ALT 17 11/25/2022 1317   ALKPHOS 50 11/25/2022 1317   BILITOT 0.3 11/25/2022 1317   GFRNONAA 53 (L) 11/25/2022 1317     No results found.     Assessment & Plan:   1. Lymphedema Recommend:  No surgery or intervention at this point in time.    I have reviewed my previous discussion with the patient  regarding swelling and why it  causes symptoms.  The patient is doing well with compression and will continue wearing graduated compression on a daily basis. The patient will  continue wearing the compression first thing in the morning and removing them in the evening. The patient is instructed specifically not to sleep in the compression.    In addition, behavioral modification including elevation during the day and exercise as tolerated will be continued.    Patient should follow-up on an annual basis   2. Essential (primary) hypertension Continue antihypertensive medications as already ordered, these medications have been reviewed and there are no changes at this time.  The patient notes that sometimes there are somewhat elevated blood pressures.  She is advised to  3. Stage 3a chronic kidney disease (HCC) This can also exacerbate some of the patient's lower extremity edema   Current Outpatient Medications on File Prior to Visit  Medication Sig Dispense Refill   acyclovir (ZOVIRAX) 400 MG tablet TAKE 1 TABLET(400 MG) BY MOUTH TWICE DAILY 60 tablet 11   amLODipine (NORVASC) 2.5 MG tablet Take 2 tablets (5 mg total) by mouth daily. 180 tablet 2   ascorbic acid (VITAMIN C) 250 MG tablet daily Take 2 tablets every day by oral route.     aspirin 81 MG EC tablet Take 81 mg by mouth daily.     Calcium Carb-Cholecalciferol (CALCIUM 600+D) 600-20 MG-MCG TABS Take 1 tablet by mouth 3 (three) times daily.     dexamethasone (DECADRON) 4 MG tablet Take 5 tablets (20 mg total) by mouth once a week. 60 tablet 0   donepezil (ARICEPT) 10 MG tablet TAKE 1 TABLET(10 MG) BY MOUTH DAILY 90 tablet 2   fenofibrate (TRICOR) 48 MG tablet TAKE 1 TABLET(48 MG) BY MOUTH DAILY 90 tablet 2   fexofenadine (ALLEGRA) 180 MG tablet Take by mouth.     hydrochlorothiazide (HYDRODIURIL) 25 MG tablet TAKE 1/2 TABLET BY MOUTH EVERY DAY 45 tablet 1   memantine (NAMENDA) 5 MG tablet Take 5 mg by mouth 2 (two) times daily.      methimazole (TAPAZOLE) 5 MG tablet Take 1 tablet (5 mg total) by mouth daily.  90 tablet 2   metoprolol succinate (TOPROL-XL) 25 MG 24 hr tablet TAKE 2 TABLETS(50 MG) BY MOUTH DAILY 180 tablet 1   montelukast (SINGULAIR) 10 MG tablet Take 1 tablet (10 mg total) by mouth See admin instructions. Take 1 tablet one day before Daratumumab injection. 60 tablet 0   Multiple Vitamins-Minerals (WOMENS MULTIVITAMIN PO) Take 1 tablet by mouth daily.     omeprazole (PRILOSEC) 20 MG capsule TAKE 1 CAPSULE(20 MG) BY MOUTH DAILY 90 capsule 3   prochlorperazine (COMPAZINE) 10 MG tablet Take 10 mg by mouth every 6 (six) hours as needed for nausea or vomiting.     valsartan (DIOVAN) 160 MG tablet Take 1 tablet (160 mg total) by mouth daily. 90 tablet 1   No current facility-administered medications on file prior to visit.    There are no Patient Instructions on file for this visit. No follow-ups on file.   Georgiana Spinner, NP

## 2022-12-13 ENCOUNTER — Other Ambulatory Visit: Payer: Self-pay

## 2022-12-23 ENCOUNTER — Encounter: Payer: Self-pay | Admitting: Oncology

## 2022-12-23 ENCOUNTER — Inpatient Hospital Stay: Payer: Medicare Other | Attending: Oncology

## 2022-12-23 ENCOUNTER — Inpatient Hospital Stay: Payer: Medicare Other

## 2022-12-23 ENCOUNTER — Inpatient Hospital Stay (HOSPITAL_BASED_OUTPATIENT_CLINIC_OR_DEPARTMENT_OTHER): Payer: Medicare Other | Admitting: Oncology

## 2022-12-23 VITALS — BP 160/67 | HR 54 | Temp 97.7°F | Resp 18 | Wt 146.3 lb

## 2022-12-23 VITALS — BP 155/65 | HR 52 | Resp 18

## 2022-12-23 DIAGNOSIS — Z5112 Encounter for antineoplastic immunotherapy: Secondary | ICD-10-CM | POA: Insufficient documentation

## 2022-12-23 DIAGNOSIS — Z823 Family history of stroke: Secondary | ICD-10-CM | POA: Diagnosis not present

## 2022-12-23 DIAGNOSIS — D649 Anemia, unspecified: Secondary | ICD-10-CM | POA: Diagnosis not present

## 2022-12-23 DIAGNOSIS — Z8249 Family history of ischemic heart disease and other diseases of the circulatory system: Secondary | ICD-10-CM | POA: Insufficient documentation

## 2022-12-23 DIAGNOSIS — M899 Disorder of bone, unspecified: Secondary | ICD-10-CM | POA: Diagnosis not present

## 2022-12-23 DIAGNOSIS — Z79620 Long term (current) use of immunosuppressive biologic: Secondary | ICD-10-CM | POA: Diagnosis not present

## 2022-12-23 DIAGNOSIS — Z79624 Long term (current) use of inhibitors of nucleotide synthesis: Secondary | ICD-10-CM | POA: Diagnosis not present

## 2022-12-23 DIAGNOSIS — I10 Essential (primary) hypertension: Secondary | ICD-10-CM | POA: Insufficient documentation

## 2022-12-23 DIAGNOSIS — M549 Dorsalgia, unspecified: Secondary | ICD-10-CM | POA: Diagnosis not present

## 2022-12-23 DIAGNOSIS — N1831 Chronic kidney disease, stage 3a: Secondary | ICD-10-CM | POA: Insufficient documentation

## 2022-12-23 DIAGNOSIS — C9000 Multiple myeloma not having achieved remission: Secondary | ICD-10-CM

## 2022-12-23 DIAGNOSIS — R5383 Other fatigue: Secondary | ICD-10-CM | POA: Insufficient documentation

## 2022-12-23 DIAGNOSIS — C9001 Multiple myeloma in remission: Secondary | ICD-10-CM

## 2022-12-23 DIAGNOSIS — Z803 Family history of malignant neoplasm of breast: Secondary | ICD-10-CM | POA: Diagnosis not present

## 2022-12-23 DIAGNOSIS — Z8261 Family history of arthritis: Secondary | ICD-10-CM | POA: Diagnosis not present

## 2022-12-23 DIAGNOSIS — Z5111 Encounter for antineoplastic chemotherapy: Secondary | ICD-10-CM

## 2022-12-23 DIAGNOSIS — E7800 Pure hypercholesterolemia, unspecified: Secondary | ICD-10-CM | POA: Diagnosis not present

## 2022-12-23 DIAGNOSIS — Z79899 Other long term (current) drug therapy: Secondary | ICD-10-CM | POA: Insufficient documentation

## 2022-12-23 LAB — CBC WITH DIFFERENTIAL/PLATELET
Abs Immature Granulocytes: 0.01 10*3/uL (ref 0.00–0.07)
Basophils Absolute: 0 10*3/uL (ref 0.0–0.1)
Basophils Relative: 1 %
Eosinophils Absolute: 0.1 10*3/uL (ref 0.0–0.5)
Eosinophils Relative: 2 %
HCT: 33.1 % — ABNORMAL LOW (ref 36.0–46.0)
Hemoglobin: 10.7 g/dL — ABNORMAL LOW (ref 12.0–15.0)
Immature Granulocytes: 0 %
Lymphocytes Relative: 36 %
Lymphs Abs: 1.7 10*3/uL (ref 0.7–4.0)
MCH: 32.6 pg (ref 26.0–34.0)
MCHC: 32.3 g/dL (ref 30.0–36.0)
MCV: 100.9 fL — ABNORMAL HIGH (ref 80.0–100.0)
Monocytes Absolute: 0.2 10*3/uL (ref 0.1–1.0)
Monocytes Relative: 5 %
Neutro Abs: 2.7 10*3/uL (ref 1.7–7.7)
Neutrophils Relative %: 56 %
Platelets: 236 10*3/uL (ref 150–400)
RBC: 3.28 MIL/uL — ABNORMAL LOW (ref 3.87–5.11)
RDW: 12.6 % (ref 11.5–15.5)
WBC: 4.7 10*3/uL (ref 4.0–10.5)
nRBC: 0 % (ref 0.0–0.2)

## 2022-12-23 LAB — COMPREHENSIVE METABOLIC PANEL
ALT: 18 U/L (ref 0–44)
AST: 25 U/L (ref 15–41)
Albumin: 3.9 g/dL (ref 3.5–5.0)
Alkaline Phosphatase: 43 U/L (ref 38–126)
Anion gap: 12 (ref 5–15)
BUN: 18 mg/dL (ref 8–23)
CO2: 24 mmol/L (ref 22–32)
Calcium: 9.6 mg/dL (ref 8.9–10.3)
Chloride: 103 mmol/L (ref 98–111)
Creatinine, Ser: 1.01 mg/dL — ABNORMAL HIGH (ref 0.44–1.00)
GFR, Estimated: 54 mL/min — ABNORMAL LOW (ref >60)
Glucose, Bld: 99 mg/dL (ref 70–99)
Potassium: 3.7 mmol/L (ref 3.5–5.1)
Sodium: 139 mmol/L (ref 135–145)
Total Bilirubin: 0.4 mg/dL (ref 0.3–1.2)
Total Protein: 7.1 g/dL (ref 6.5–8.1)

## 2022-12-23 MED ORDER — DARATUMUMAB-HYALURONIDASE-FIHJ 1800-30000 MG-UT/15ML ~~LOC~~ SOLN
1800.0000 mg | Freq: Once | SUBCUTANEOUS | Status: AC
  Administered 2022-12-23: 1800 mg via SUBCUTANEOUS
  Filled 2022-12-23: qty 15

## 2022-12-23 MED ORDER — DIPHENHYDRAMINE HCL 25 MG PO CAPS
50.0000 mg | ORAL_CAPSULE | Freq: Once | ORAL | Status: AC
  Administered 2022-12-23: 50 mg via ORAL
  Filled 2022-12-23: qty 2

## 2022-12-23 MED ORDER — ACETAMINOPHEN 325 MG PO TABS
650.0000 mg | ORAL_TABLET | Freq: Once | ORAL | Status: AC
  Administered 2022-12-23: 650 mg via ORAL
  Filled 2022-12-23: qty 2

## 2022-12-23 NOTE — Assessment & Plan Note (Signed)
recommend patient to avoid nephrotoxin.  Encourage oral hydration. 

## 2022-12-23 NOTE — Assessment & Plan Note (Signed)
Continue Q3 months Zometa- today, and next due July  2024 Recommend calcium 1200 mg daily. 

## 2022-12-23 NOTE — Progress Notes (Signed)
Hematology/Oncology Progress note [phone] [phone]     CHIEF COMPLAINTS/REASON FOR VISIT:  Follow-up for multiple myeloma  ASSESSMENT & PLAN:   Cancer Staging  Multiple myeloma (HCC) Staging form: Plasma Cell Myeloma and Plasma Cell Disorders, AJCC 8th Edition - Clinical stage from 08/22/2021: RISS Stage II (Beta-2-microglobulin (mg/L): 4.1, Albumin (g/dL): 2.8, ISS: Stage II, High-risk cytogenetics: Absent, LDH: Normal) - Signed by Rickard Patience, MD on 09/06/2021   Multiple myeloma (HCC) Multiple myeloma, IgA kappa, complex hyperploid gain of 1q, S/p Daratumumab Rd.   Revlimid D1-21,  bone marrow biopsy results were reviewed and discussed with patient - 3% plasma cell, no M protein. She was in CR  Labs are reviewed and discussed with patient.  Lab Results  Component Value Date   MPROTEIN 0.5 (H) 11/25/2022   MPROTEIN Comment (A) 10/28/2022   MPROTEIN 0.1 (H) 09/30/2022   KAPLAMBRATIO 2.49 (H) 11/25/2022   KAPLAMBRATIO 2.63 (H) 10/28/2022   KAPLAMBRATIO 2.49 (H) 09/30/2022     Proceed with  Daratumumab monotherapy maintenance - she takes home supply of Dexamethasone prior to her Daratumumab.  Continue asprin 81mg  daily and Acyclovir 400mg  BID.   Bone lesion Continue Q3 months Zometa- today, and next due July  2024 Recommend calcium 1200 mg daily.  Normocytic anemia Stable Continue monitor.   Stage 3a chronic kidney disease (HCC) recommend patient to avoid nephrotoxin.  Encourage oral hydration.  Encounter for antineoplastic chemotherapy Chemotherapy plan as listed as above.  Orders Placed This Encounter  Procedures   Kappa/lambda light chains    Standing Status:   Future    Standing Expiration Date:   02/17/2024   Multiple Myeloma Panel (SPEP&IFE w/QIG)    Standing Status:   Future    Standing Expiration Date:   02/17/2024   Comprehensive metabolic panel    Standing Status:   Future    Standing Expiration Date:   02/17/2024   CBC with  Differential    Standing Status:   Future    Standing Expiration Date:   02/17/2024   Kappa/lambda light chains    Standing Status:   Future    Standing Expiration Date:   03/16/2024   Multiple Myeloma Panel (SPEP&IFE w/QIG)    Standing Status:   Future    Standing Expiration Date:   03/16/2024   Comprehensive metabolic panel    Standing Status:   Future    Standing Expiration Date:   03/16/2024   CBC with Differential    Standing Status:   Future    Standing Expiration Date:   03/16/2024     Follow up  Lab/MD/Dara in 4 weeks  All questions were answered. The patient knows to call the clinic with any problems, questions or concerns.  Rickard Patience, MD, PhD [facility] Hematology Oncology 12/23/2022     HISTORY OF PRESENTING ILLNESS:   Stephanie Delacruz is a  89 y.o.  female presents for follow up of multiple myeloma.  Oncology History  Multiple myeloma (HCC)  08/22/2021 Initial Diagnosis   Multiple myeloma (HCC) IgA kappa Initial M protein 5.2, free kappa light chain ratio 89.2, Kappa/lambda ratio 13.31   08/22/2021 Cancer Staging   Staging form: Plasma Cell Myeloma and Plasma Cell Disorders, AJCC 8th Edition - Clinical stage from 08/22/2021: RISS Stage II (Beta-2-microglobulin (mg/L): 4.1, Albumin (g/dL): 2.8, ISS: Stage II, High-risk cytogenetics: Absent, LDH: Normal) - Signed by Rickard Patience, MD on 09/06/2021 Stage prefix: Initial diagnosis Beta 2 microglobulin range (mg/L): 3.5 to 5.49 Albumin range (  g/dL): Less than 3.5 Cytogenetics: 1q addition   08/26/2021 - 09/01/2022 Chemotherapy   08/26/21 MYELOMA Daratumumab SQ q28d  09/06/2021 added Revlimid 10mg  x 14 days.  09/27/2021 starting cycle 2 Revlimid 10 mg 21 days on, 7 days off. Weekly Dexamethasone.    08/26/2021 Bone Marrow Biopsy   Bone marrow biopsy showed hypercellular marrow with plasma cell neoplasm. 67% plasma cells in the aspirate. Cytogenetics showed complex hyperplasia, gain of 1 q.    07/23/2022 Bone Marrow Biopsy   Bone  marrow biopsy showed Hypercellular bone marrow for age with trilineage hematopoiesis and 3% plasma cells  Cytogenetics are normal.    S/p thyroid lesion biopsy, obtained by endocrinologist. + AUS INTERVAL HISTORY Stephanie Delacruz is a 89 y.o. female who has above history reviewed by me today presents for follow up visit for management of multiple myeloma She was accompanied by her daughter today.  She denies any nausea vomiting diarrhea.   No new complaints.     Review of Systems  Constitutional:  Positive for fatigue. Negative for appetite change, chills and fever.  HENT:   Negative for hearing loss and voice change.   Eyes:  Negative for eye problems.  Respiratory:  Negative for chest tightness and cough.   Cardiovascular:  Negative for chest pain.  Gastrointestinal:  Negative for abdominal distention, abdominal pain and blood in stool.  Endocrine: Negative for hot flashes.  Genitourinary:  Negative for difficulty urinating and frequency.   Musculoskeletal:  Positive for back pain. Negative for arthralgias.  Skin:  Negative for itching and rash.  Neurological:  Negative for extremity weakness.  Hematological:  Negative for adenopathy.  Psychiatric/Behavioral:  Negative for confusion.     MEDICAL HISTORY:  Past Medical History:  Diagnosis Date   Anemia    GERD (gastroesophageal reflux disease)    Hypercholesterolemia    Hypertension    Renal insufficiency     SURGICAL HISTORY: Past Surgical History:  Procedure Laterality Date   TONSILLECTOMY     TUBAL LIGATION      SOCIAL HISTORY: Social History   Socioeconomic History   Marital status: Married    Spouse name: Not on file   Number of children: Not on file   Years of education: Not on file   Highest education level: Not on file  Occupational History   Not on file  Tobacco Use   Smoking status: Never   Smokeless tobacco: Never  Vaping Use   Vaping Use: Never used  Substance and Sexual Activity   Alcohol use:  No   Drug use: No   Sexual activity: Not on file  Other Topics Concern   Not on file  Social History Narrative   Lives with husband Fayrene Fearing and daughter Marylene Land. No pets.    Social Determinants of Health   Financial Resource Strain: Not on file  Food Insecurity: Not on file  Transportation Needs: Not on file  Physical Activity: Not on file  Stress: Not on file  Social Connections: Not on file  Intimate Partner Violence: Not on file    FAMILY HISTORY: Family History  Problem Relation Age of Onset   Hypertension Mother    Arthritis Mother    Hypertension Father    Stroke Father    Breast cancer Niece     ALLERGIES:  has No Known Allergies.  MEDICATIONS:  Current Outpatient Medications  Medication Sig Dispense Refill   acyclovir (ZOVIRAX) 400 MG tablet TAKE 1 TABLET(400 MG) BY MOUTH TWICE DAILY 60 tablet  11   amLODipine (NORVASC) 2.5 MG tablet Take 2 tablets (5 mg total) by mouth daily. 180 tablet 2   ascorbic acid (VITAMIN C) 250 MG tablet daily Take 2 tablets every day by oral route.     aspirin 81 MG EC tablet Take 81 mg by mouth daily.     Calcium Carb-Cholecalciferol (CALCIUM 600+D) 600-20 MG-MCG TABS Take 1 tablet by mouth 3 (three) times daily.     dexamethasone (DECADRON) 4 MG tablet Take 5 tablets (20 mg total) by mouth once a week. 60 tablet 0   donepezil (ARICEPT) 10 MG tablet TAKE 1 TABLET(10 MG) BY MOUTH DAILY 90 tablet 2   fenofibrate (TRICOR) 48 MG tablet TAKE 1 TABLET(48 MG) BY MOUTH DAILY 90 tablet 2   fexofenadine (ALLEGRA) 180 MG tablet Take by mouth.     hydrochlorothiazide (HYDRODIURIL) 25 MG tablet TAKE 1/2 TABLET BY MOUTH EVERY DAY 45 tablet 1   memantine (NAMENDA) 5 MG tablet Take 5 mg by mouth 2 (two) times daily.     methimazole (TAPAZOLE) 5 MG tablet Take 1 tablet (5 mg total) by mouth daily. 90 tablet 2   metoprolol succinate (TOPROL-XL) 25 MG 24 hr tablet TAKE 2 TABLETS(50 MG) BY MOUTH DAILY 180 tablet 1   montelukast (SINGULAIR) 10 MG tablet Take  1 tablet (10 mg total) by mouth See admin instructions. Take 1 tablet one day before Daratumumab injection. 60 tablet 0   Multiple Vitamins-Minerals (WOMENS MULTIVITAMIN PO) Take 1 tablet by mouth daily.     omeprazole (PRILOSEC) 20 MG capsule TAKE 1 CAPSULE(20 MG) BY MOUTH DAILY 90 capsule 3   prochlorperazine (COMPAZINE) 10 MG tablet Take 10 mg by mouth every 6 (six) hours as needed for nausea or vomiting.     valsartan (DIOVAN) 160 MG tablet Take 1 tablet (160 mg total) by mouth daily. 90 tablet 1   No current facility-administered medications for this visit.     PHYSICAL EXAMINATION: ECOG PERFORMANCE STATUS: 1 - Symptomatic but completely ambulatory Vitals:   12/23/22 1027  BP: (!) 160/67  Pulse: (!) 54  Resp: 18  Temp: 97.7 F (36.5 C)   Filed Weights   12/23/22 1027  Weight: 146 lb 4.8 oz (66.4 kg)    Physical Exam Constitutional:      General: She is not in acute distress. HENT:     Head: Normocephalic and atraumatic.  Eyes:     General: No scleral icterus. Cardiovascular:     Rate and Rhythm: Normal rate and regular rhythm.     Heart sounds: Normal heart sounds.  Pulmonary:     Effort: Pulmonary effort is normal. No respiratory distress.     Breath sounds: No wheezing.  Abdominal:     General: Bowel sounds are normal. There is no distension.     Palpations: Abdomen is soft.  Musculoskeletal:        General: No deformity. Normal range of motion.     Cervical back: Normal range of motion and neck supple.  Skin:    General: Skin is warm and dry.     Findings: No erythema or rash.  Neurological:     Mental Status: She is alert and oriented to person, place, and time. Mental status is at baseline.     Cranial Nerves: No cranial nerve deficit.     Coordination: Coordination normal.  Psychiatric:        Mood and Affect: Mood normal.     LABORATORY DATA:  I have reviewed  the data as listed    Latest Ref Rng & Units 12/23/2022   10:11 AM 11/25/2022    1:17 PM  10/28/2022    8:44 AM  CBC  WBC 4.0 - 10.5 K/uL 4.7  6.0  4.7   Hemoglobin 12.0 - 15.0 g/dL 16.1  09.6  04.5   Hematocrit 36.0 - 46.0 % 33.1  32.4  31.9   Platelets 150 - 400 K/uL 236  228  189       Latest Ref Rng & Units 12/23/2022   10:11 AM 11/25/2022    1:17 PM 10/28/2022    8:44 AM  CMP  Glucose 70 - 99 mg/dL 99  409  86   BUN 8 - 23 mg/dL 18  20  23    Creatinine 0.44 - 1.00 mg/dL 8.11  9.14  7.82   Sodium 135 - 145 mmol/L 139  137  142   Potassium 3.5 - 5.1 mmol/L 3.7  3.9  3.6   Chloride 98 - 111 mmol/L 103  104  109   CO2 22 - 32 mmol/L 24  25  25    Calcium 8.9 - 10.3 mg/dL 9.6  9.4  9.5   Total Protein 6.5 - 8.1 g/dL 7.1  7.0  6.4   Total Bilirubin 0.3 - 1.2 mg/dL 0.4  0.3  0.3   Alkaline Phos 38 - 126 U/L 43  50  49   AST 15 - 41 U/L 25  27  29    ALT 0 - 44 U/L 18  17  20       Iron/TIBC/Ferritin/ %Sat    Component Value Date/Time   IRON 54 07/24/2021 1212   TIBC 277 07/24/2021 1212   FERRITIN 93 07/24/2021 1212   IRONPCTSAT 20 07/24/2021 1212       RADIOGRAPHIC STUDIES: I have personally reviewed the radiological images as listed and agreed with the findings in the report. [place] FNA BX THYROID 1ST LESION AFIRMA  Result Date: 10/07/2022 INDICATION: Indeterminate thyroid nodule EXAM: ULTRASOUND GUIDED FINE NEEDLE ASPIRATION OF INDETERMINATE THYROID NODULE x 2 COMPARISON:  Ultrasound thyroid study dated 08/27/2022 MEDICATIONS: 1 % lidocaine COMPLICATIONS: None immediate. TECHNIQUE: Informed written consent was obtained from the patient after a discussion of the risks, benefits and alternatives to treatment. Questions regarding the procedure were encouraged and answered. A timeout was performed prior to the initiation of the procedure. Pre-procedural ultrasound scanning demonstrated unchanged size and appearance of the indeterminate nodules within the inferior isthmus The procedure was planned. The neck was prepped in the usual sterile fashion, and a sterile drape was  applied covering the operative field. A timeout was performed prior to the initiation of the procedure. Local anesthesia was provided with 1% lidocaine. Under direct ultrasound guidance, 6 FNA biopsies were performed of the inferior isthmus nodule with a 25 gauge needle. One of those were obtained for [facility]. Multiple ultrasound images were saved for procedural documentation purposes. The samples were prepared and submitted to pathology. Under direct ultrasound guidance, 5 FNA biopsies were performed of the mid left thyroid nodule with a 25 gauge needle. 2 of those were obtained for [facility]. Multiple ultrasound images were saved for procedural documentation purposes. The samples were prepared and submitted to pathology. Limited post procedural scanning was negative for hematoma or additional complication. Dressings were placed. The patient tolerated the above procedures procedure well without immediate postprocedural complication. FINDINGS: Nodule reference number based on prior diagnostic ultrasound: 2 Maximum size: 3.2 cm Location: Isthmus; Inferior ACR TI-RADS risk  category: TR3 (3 points) Reason for biopsy: meets ACR TI-RADS criteria _________________________________________________________ Nodule reference number based on prior diagnostic ultrasound: 4 Maximum size: 2.7 cm Location: Left; Mid ACR TI-RADS risk category: TR3 (3 points) Reason for biopsy: meets ACR TI-RADS criteria Ultrasound imaging confirms appropriate placement of the needles within the thyroid nodule. IMPRESSION: 1. Technically successful ultrasound guided fine needle aspiration of inferior isthmus thyroid nodule 2. Technically successful ultrasound guided fine needle aspiration of left mid thyroid nodule Read by: Alex Gardener, AGNP-BC Electronically Signed   By: Marliss Coots M.D.   On: 10/07/2022 16:53   [place] FNA BIOPSY THYROID EA ADD LESION AFIRMA  Result Date: 10/07/2022 INDICATION: Indeterminate thyroid nodule EXAM: ULTRASOUND GUIDED FINE  NEEDLE ASPIRATION OF INDETERMINATE THYROID NODULE x 2 COMPARISON:  Ultrasound thyroid study dated 08/27/2022 MEDICATIONS: 1 % lidocaine COMPLICATIONS: None immediate. TECHNIQUE: Informed written consent was obtained from the patient after a discussion of the risks, benefits and alternatives to treatment. Questions regarding the procedure were encouraged and answered. A timeout was performed prior to the initiation of the procedure. Pre-procedural ultrasound scanning demonstrated unchanged size and appearance of the indeterminate nodules within the inferior isthmus The procedure was planned. The neck was prepped in the usual sterile fashion, and a sterile drape was applied covering the operative field. A timeout was performed prior to the initiation of the procedure. Local anesthesia was provided with 1% lidocaine. Under direct ultrasound guidance, 6 FNA biopsies were performed of the inferior isthmus nodule with a 25 gauge needle. One of those were obtained for [facility]. Multiple ultrasound images were saved for procedural documentation purposes. The samples were prepared and submitted to pathology. Under direct ultrasound guidance, 5 FNA biopsies were performed of the mid left thyroid nodule with a 25 gauge needle. 2 of those were obtained for [facility]. Multiple ultrasound images were saved for procedural documentation purposes. The samples were prepared and submitted to pathology. Limited post procedural scanning was negative for hematoma or additional complication. Dressings were placed. The patient tolerated the above procedures procedure well without immediate postprocedural complication. FINDINGS: Nodule reference number based on prior diagnostic ultrasound: 2 Maximum size: 3.2 cm Location: Isthmus; Inferior ACR TI-RADS risk category: TR3 (3 points) Reason for biopsy: meets ACR TI-RADS criteria _________________________________________________________ Nodule reference number based on prior diagnostic ultrasound: 4  Maximum size: 2.7 cm Location: Left; Mid ACR TI-RADS risk category: TR3 (3 points) Reason for biopsy: meets ACR TI-RADS criteria Ultrasound imaging confirms appropriate placement of the needles within the thyroid nodule. IMPRESSION: 1. Technically successful ultrasound guided fine needle aspiration of inferior isthmus thyroid nodule 2. Technically successful ultrasound guided fine needle aspiration of left mid thyroid nodule Read by: Alex Gardener, AGNP-BC Electronically Signed   By: Marliss Coots M.D.   On: 10/07/2022 16:26

## 2022-12-23 NOTE — Assessment & Plan Note (Addendum)
Multiple myeloma, IgA kappa, complex hyperploid gain of 1q, S/p Daratumumab Rd.   Revlimid D1-21,  bone marrow biopsy results were reviewed and discussed with patient - 3% plasma cell, no M protein. She was in CR  Labs are reviewed and discussed with patient.  Lab Results  Component Value Date   MPROTEIN 0.5 (H) 11/25/2022   MPROTEIN Comment (A) 10/28/2022   MPROTEIN 0.1 (H) 09/30/2022   KAPLAMBRATIO 2.49 (H) 11/25/2022   KAPLAMBRATIO 2.63 (H) 10/28/2022   KAPLAMBRATIO 2.49 (H) 09/30/2022     Proceed with  Daratumumab monotherapy maintenance - she takes home supply of Dexamethasone prior to her Daratumumab.  Continue asprin 81mg  daily and Acyclovir 400mg  BID.

## 2022-12-23 NOTE — Progress Notes (Signed)
Pt observed 10 minutes post injection.  VSS.

## 2022-12-23 NOTE — Assessment & Plan Note (Signed)
Chemotherapy plan as listed as above. 

## 2022-12-23 NOTE — Patient Instructions (Signed)
[facility]  Discharge Instructions: Thank you for choosing [facility] to provide your oncology and hematology care.  If you have a lab appointment with the Cancer Center, please go directly to the Cancer Center and check in at the registration area.  Wear comfortable clothing and clothing appropriate for easy access to any Portacath or PICC line.   We strive to give you quality time with your provider. You may need to reschedule your appointment if you arrive late (15 or more minutes).  Arriving late affects you and other patients whose appointments are after yours.  Also, if you miss three or more appointments without notifying the office, you may be dismissed from the clinic at the provider's discretion.      For prescription refill requests, have your pharmacy contact our office and allow 72 hours for refills to be completed.    Today you received the following chemotherapy and/or immunotherapy agents Darzalex      To help prevent nausea and vomiting after your treatment, we encourage you to take your nausea medication as directed.  BELOW ARE SYMPTOMS THAT SHOULD BE REPORTED IMMEDIATELY: *FEVER GREATER THAN 100.4 F (38 C) OR HIGHER *CHILLS OR SWEATING *NAUSEA AND VOMITING THAT IS NOT CONTROLLED WITH YOUR NAUSEA MEDICATION *UNUSUAL SHORTNESS OF BREATH *UNUSUAL BRUISING OR BLEEDING *URINARY PROBLEMS (pain or burning when urinating, or frequent urination) *BOWEL PROBLEMS (unusual diarrhea, constipation, pain near the anus) TENDERNESS IN MOUTH AND THROAT WITH OR WITHOUT PRESENCE OF ULCERS (sore throat, sores in mouth, or a toothache) UNUSUAL RASH, SWELLING OR PAIN  UNUSUAL VAGINAL DISCHARGE OR ITCHING   Items with * indicate a potential emergency and should be followed up as soon as possible or go to the Emergency Department if any problems should occur.  Please show the CHEMOTHERAPY ALERT CARD or IMMUNOTHERAPY ALERT CARD at check-in to  the Emergency Department and triage nurse.  Should you have questions after your visit or need to cancel or reschedule your appointment, please contact [facility] [place] [facility]  [phone] and follow the prompts.  Office hours are 8:00 a.m. to 4:30 p.m. Monday - Friday. Please note that voicemails left after 4:00 p.m. may not be returned until the following business day.  We are closed weekends and major holidays. You have access to a nurse at all times for urgent questions. Please call the main number to the clinic [phone] and follow the prompts.  For any non-urgent questions, you may also contact your provider using MyChart. We now offer e-Visits for anyone 18 and older to request care online for non-urgent symptoms. For details visit [email or web address].   Also download the MyChart app! Go to the app store, search "MyChart", open the app, select , and log in with your MyChart username and password.      

## 2022-12-23 NOTE — Assessment & Plan Note (Signed)
Stable. Continue monitor 

## 2022-12-24 LAB — KAPPA/LAMBDA LIGHT CHAINS
Kappa free light chain: 8.7 mg/L (ref 3.3–19.4)
Kappa, lambda light chain ratio: 2.56 — ABNORMAL HIGH (ref 0.26–1.65)
Lambda free light chains: 3.4 mg/L — ABNORMAL LOW (ref 5.7–26.3)

## 2022-12-29 ENCOUNTER — Ambulatory Visit: Payer: Medicare Other | Admitting: Internal Medicine

## 2022-12-29 ENCOUNTER — Encounter: Payer: Self-pay | Admitting: Internal Medicine

## 2022-12-29 VITALS — BP 120/70 | HR 67 | Ht 61.5 in | Wt 146.0 lb

## 2022-12-29 DIAGNOSIS — E0590 Thyrotoxicosis, unspecified without thyrotoxic crisis or storm: Secondary | ICD-10-CM | POA: Diagnosis not present

## 2022-12-29 DIAGNOSIS — E042 Nontoxic multinodular goiter: Secondary | ICD-10-CM

## 2022-12-29 MED ORDER — METHIMAZOLE 5 MG PO TABS: 5.0000 mg | ORAL_TABLET | Freq: Every day | ORAL | 3 refills | Status: DC

## 2022-12-29 NOTE — Progress Notes (Signed)
Name: Stephanie Delacruz  MRN/ DOB: [id number], 13-Sep-1933    Age/ Sex: 89 y.o., female    PCP: Dale [place], MD   Reason for Endocrinology Evaluation: Multinodular goiter     Date of Initial Endocrinology Evaluation: 09/16/2022    HPI: Stephanie Delacruz is a 89 y.o. female with a past medical history of multiple myeloma, HTN, and dyslipidemia. The patient presented for initial endocrinology clinic visit on 09/16/2022 for consultative assistance with her multinodular goiter  Pt has been noted with multinodular goiter on ultrasound 07/2022 with two nodules meeting FNA criteria.  Patient on chemotherapy   TRAb undetectable  She is s/p FNA of the isthmic nodule with cytology report consistent with atypia of uncertain significance (Bethesda category III) on 10/07/2022, ThyroSeq was reported as negative but limited  She is also s/p benign FNA of left mid thyroid nodule, Bethesda category II  No family history of thyroid disease  SUBJECTIVE:    Today (12/29/22): Stephanie Delacruz is here for follow-up on multinodular goiter   Patient is accompanied by her daughter Marylene Land She continues to follow-up with oncology for the treatment of multiple myeloma, she is on maintenance therapy with daratumumab  Weight stable No local neck symptoms , denies dysphagia  Denies palpitations  Has hand tremors   Denies constipation nor diarrhea  Tolerating methimazole  No biotin intake   Methimazole 5 mg daily           HISTORY:  Past Medical History:  Past Medical History:  Diagnosis Date   Anemia    GERD (gastroesophageal reflux disease)    Hypercholesterolemia    Hypertension    Renal insufficiency    Past Surgical History:  Past Surgical History:  Procedure Laterality Date   TONSILLECTOMY     TUBAL LIGATION      Social History:  reports that she has never smoked. She has never used smokeless tobacco. She reports that she does not drink alcohol and does not use drugs. Family History:  family history includes Arthritis in her mother; Breast cancer in her niece; Hypertension in her father and mother; Stroke in her father.   HOME MEDICATIONS: Allergies as of 12/29/2022   No Known Allergies      Medication List        Accurate as of December 29, 2022  7:18 AM. If you have any questions, ask your nurse or doctor.          acyclovir 400 MG tablet Commonly known as: ZOVIRAX TAKE 1 TABLET(400 MG) BY MOUTH TWICE DAILY   amLODipine 2.5 MG tablet Commonly known as: NORVASC Take 2 tablets (5 mg total) by mouth daily.   ascorbic acid 250 MG tablet Commonly known as: VITAMIN C daily Take 2 tablets every day by oral route.   aspirin EC 81 MG tablet Take 81 mg by mouth daily.   Calcium 600+D 600-20 MG-MCG Tabs Generic drug: Calcium Carb-Cholecalciferol Take 1 tablet by mouth 3 (three) times daily.   dexamethasone 4 MG tablet Commonly known as: DECADRON Take 5 tablets (20 mg total) by mouth once a week.   donepezil 10 MG tablet Commonly known as: ARICEPT TAKE 1 TABLET(10 MG) BY MOUTH DAILY   fenofibrate 48 MG tablet Commonly known as: TRICOR TAKE 1 TABLET(48 MG) BY MOUTH DAILY   fexofenadine 180 MG tablet Commonly known as: ALLEGRA Take by mouth.   hydrochlorothiazide 25 MG tablet Commonly known as: HYDRODIURIL TAKE 1/2 TABLET BY MOUTH EVERY DAY   memantine  5 MG tablet Commonly known as: NAMENDA Take 5 mg by mouth 2 (two) times daily.   methimazole 5 MG tablet Commonly known as: TAPAZOLE Take 1 tablet (5 mg total) by mouth daily.   metoprolol succinate 25 MG 24 hr tablet Commonly known as: TOPROL-XL TAKE 2 TABLETS(50 MG) BY MOUTH DAILY   montelukast 10 MG tablet Commonly known as: Singulair Take 1 tablet (10 mg total) by mouth See admin instructions. Take 1 tablet one day before Daratumumab injection.   omeprazole 20 MG capsule Commonly known as: PRILOSEC TAKE 1 CAPSULE(20 MG) BY MOUTH DAILY   prochlorperazine 10 MG tablet Commonly known as:  COMPAZINE Take 10 mg by mouth every 6 (six) hours as needed for nausea or vomiting.   valsartan 160 MG tablet Commonly known as: DIOVAN Take 1 tablet (160 mg total) by mouth daily.   WOMENS MULTIVITAMIN PO Take 1 tablet by mouth daily.          REVIEW OF SYSTEMS: A comprehensive ROS was conducted with the patient and is negative except as per HPI   OBJECTIVE:  VS: There were no vitals taken for this visit.   Wt Readings from Last 3 Encounters:  12/23/22 146 lb 4.8 oz (66.4 kg)  12/12/22 147 lb 3.2 oz (66.8 kg)  11/25/22 144 lb 4.8 oz (65.5 kg)     EXAM: General: Pt appears well and is in NAD  Eyes: External eye exam normal without stare, lid lag or exophthalmos.  EOM intact.    Neck: General: Supple without adenopathy. Thyroid: Thyroid size normal.  No goiter or nodules appreciated. No thyroid bruit.  Lungs: Clear with good BS bilat with no rales, rhonchi, or wheezes  Heart: Auscultation: RRR.  Abdomen: Normoactive bowel sounds, soft, nontender, without masses or organomegaly palpable  Extremities:  BL LE: No pretibial edema normal ROM and strength.  Mental Status: Judgment, insight: Intact Orientation: Oriented to time, place, and person Mood and affect: No depression, anxiety, or agitation     DATA REVIEWED:   Latest Reference Range & Units 11/17/22 10:46  TSH 0.35 - 5.50 uIU/mL 1.26  T4,Free(Direct) 0.60 - 1.60 ng/dL 2.13      Latest Reference Range & Units 09/02/22 09:05  Sodium 135 - 145 mmol/L 141  Potassium 3.5 - 5.1 mmol/L 3.6  Chloride 98 - 111 mmol/L 107  CO2 22 - 32 mmol/L 26  Glucose 70 - 99 mg/dL 86  BUN 8 - 23 mg/dL 16  Creatinine 0.86 - 5.78 mg/dL 4.69 (H)  Calcium 8.9 - 10.3 mg/dL 9.4  Anion gap 5 - 15  8  Alkaline Phosphatase 38 - 126 U/L 45  Albumin 3.5 - 5.0 g/dL 3.4 (L)  AST 15 - 41 U/L 23  ALT 0 - 44 U/L 17  Total Protein 6.5 - 8.1 g/dL 6.2 (L)  Total Bilirubin 0.3 - 1.2 mg/dL 0.2 (L)  GFR, Estimated >60 mL/min 54 (L)     Thyroid ultrasound 08/27/2022  Estimated total number of nodules >/= 1 cm: 3   Number of spongiform nodules >/=  2 cm not described below (TR1): 0   Number of mixed cystic and solid nodules >/= 1.5 cm not described below (TR2): 0   _________________________________________________________   Nodule # 1: Approximately 1.2 cm spongiform nodule in the thyroid isthmus is considered sonographically low risk/benign. No imaging follow-up recommended.   Nodule # 2:   Location: Isthmus; Inferior   Maximum size: 3.2 cm; Other 2 dimensions: 2.4 x 3.1 cm  Composition: solid/almost completely solid (2)   Echogenicity: isoechoic (1)   Shape: not taller-than-wide (0)   Margins: smooth (0)   Echogenic foci: none (0)   ACR TI-RADS total points: 3.   ACR TI-RADS risk category: TR3 (3 points).   ACR TI-RADS recommendations:   **Given size (>/= 2.5 cm) and appearance, fine needle aspiration of this mildly suspicious nodule should be considered based on TI-RADS criteria.   _________________________________________________________   Nodule # 3: Anechoic cyst with peripheral colloid artifact in the right mid gland measures up to 1.7 cm. Findings are consistent with a benign colloid nodule.   Nodule # 4:   Location: Left; Mid   Maximum size: 2.7 cm; Other 2 dimensions: 1.5 x 2.1 cm   Composition: solid/almost completely solid (2)   Echogenicity: isoechoic (1)   Shape: not taller-than-wide (0)   Margins: ill-defined (0)   Echogenic foci: none (0)   ACR TI-RADS total points: 3.   ACR TI-RADS risk category: TR3 (3 points).   ACR TI-RADS recommendations:   **Given size (>/= 2.5 cm) and appearance, fine needle aspiration of this mildly suspicious nodule should be considered based on TI-RADS criteria.   _________________________________________________________   IMPRESSION: 1. Enlarged, heterogeneous and multinodular thyroid gland most consistent with multinodular  goiter. 2. Large TI-RADS category 3 nodules in the thyroid isthmus (labeled # 2) and the left mid gland (labeled # 4) both meet criteria to consider fine-needle aspiration biopsy.     ASSESSMENT/PLAN/RECOMMENDATIONS:   Multinodular goiter  -No local neck symptoms -Most likely the reason for subclinical hyperthyroid   -She is s/p benign FNA of the left mid thyroid nodule as well as atypia of undetermined significance of the isthmic nodule with benign ThyroSeq 09/2022 -Will repeat thyroid ultrasound by next visit   2. Subclinical Hyperthyroidism:  -Most likely due to autonomous thyroid nodule -Tolerating methimazole without side effects -Most recent TFTs were normal, no change   Medication Continue methimazole 5 mg daily   Follow-up in 6 months  Signed electronically by: Lyndle Herrlich, MD  [facility]  [facility] [address] [place] [place], [place] [address] Phone: [phone] FAX: [phone]   CC: Dale [place], MD [address] [place] [address] Phone: [phone] Fax: [phone]   Return to Endocrinology clinic as below: Future Appointments  Date Time Provider Department Center  12/29/2022 10:10 AM Gianelle Mccaul, Konrad Dolores, MD [facility] None  01/20/2023  9:30 AM [facility] None  01/20/2023 10:00 AM Rickard Patience, MD [facility] None  01/20/2023 10:30 AM [facility] INFUSION CHAIR 5 [facility] None  03/25/2023 10:00 AM Dale , MD [facility]  12/10/2023 11:00 AM Georgiana Spinner, NP [facility] None

## 2022-12-29 NOTE — Patient Instructions (Signed)
Continue methimazole 5mg daily

## 2022-12-30 ENCOUNTER — Encounter: Payer: Self-pay | Admitting: Internal Medicine

## 2022-12-30 LAB — MULTIPLE MYELOMA PANEL, SERUM
Albumin SerPl Elph-Mcnc: 3.5 g/dL (ref 2.9–4.4)
Albumin/Glob SerPl: 1.3 (ref 0.7–1.7)
Alpha 1: 0.3 g/dL (ref 0.0–0.4)
Alpha2 Glob SerPl Elph-Mcnc: 0.7 g/dL (ref 0.4–1.0)
B-Globulin SerPl Elph-Mcnc: 1.2 g/dL (ref 0.7–1.3)
Gamma Glob SerPl Elph-Mcnc: 0.6 g/dL (ref 0.4–1.8)
Globulin, Total: 2.8 g/dL (ref 2.2–3.9)
IgA: 242 mg/dL (ref 64–422)
IgG (Immunoglobin G), Serum: 509 mg/dL — ABNORMAL LOW (ref 586–1602)
IgM (Immunoglobulin M), Srm: 15 mg/dL — ABNORMAL LOW (ref 26–217)
M Protein SerPl Elph-Mcnc: 0.5 g/dL — ABNORMAL HIGH
Total Protein ELP: 6.3 g/dL (ref 6.0–8.5)

## 2023-01-13 DIAGNOSIS — C9000 Multiple myeloma not having achieved remission: Secondary | ICD-10-CM | POA: Diagnosis not present

## 2023-01-13 DIAGNOSIS — R55 Syncope and collapse: Secondary | ICD-10-CM | POA: Diagnosis not present

## 2023-01-13 DIAGNOSIS — R413 Other amnesia: Secondary | ICD-10-CM | POA: Diagnosis not present

## 2023-01-14 ENCOUNTER — Other Ambulatory Visit: Payer: Self-pay | Admitting: Internal Medicine

## 2023-01-15 ENCOUNTER — Other Ambulatory Visit: Payer: Self-pay | Admitting: Student

## 2023-01-15 DIAGNOSIS — R413 Other amnesia: Secondary | ICD-10-CM

## 2023-01-15 NOTE — Telephone Encounter (Signed)
It appears that the medication was sent in.  Please confirm with pharmacy they received the medication and if so, that pt is aware that medication has been sent to the pharmacy.

## 2023-01-20 ENCOUNTER — Inpatient Hospital Stay: Payer: Medicare Other | Attending: Oncology

## 2023-01-20 ENCOUNTER — Inpatient Hospital Stay: Payer: Medicare Other

## 2023-01-20 ENCOUNTER — Inpatient Hospital Stay (HOSPITAL_BASED_OUTPATIENT_CLINIC_OR_DEPARTMENT_OTHER): Payer: Medicare Other | Admitting: Oncology

## 2023-01-20 ENCOUNTER — Encounter: Payer: Self-pay | Admitting: Oncology

## 2023-01-20 VITALS — BP 159/77 | HR 65 | Temp 97.1°F | Ht 60.5 in | Wt 147.1 lb

## 2023-01-20 VITALS — BP 162/71 | HR 57 | Temp 97.4°F | Resp 18

## 2023-01-20 DIAGNOSIS — Z8261 Family history of arthritis: Secondary | ICD-10-CM | POA: Diagnosis not present

## 2023-01-20 DIAGNOSIS — D649 Anemia, unspecified: Secondary | ICD-10-CM | POA: Diagnosis not present

## 2023-01-20 DIAGNOSIS — Z803 Family history of malignant neoplasm of breast: Secondary | ICD-10-CM | POA: Insufficient documentation

## 2023-01-20 DIAGNOSIS — C9000 Multiple myeloma not having achieved remission: Secondary | ICD-10-CM

## 2023-01-20 DIAGNOSIS — I10 Essential (primary) hypertension: Secondary | ICD-10-CM | POA: Insufficient documentation

## 2023-01-20 DIAGNOSIS — E7800 Pure hypercholesterolemia, unspecified: Secondary | ICD-10-CM | POA: Diagnosis not present

## 2023-01-20 DIAGNOSIS — M899 Disorder of bone, unspecified: Secondary | ICD-10-CM

## 2023-01-20 DIAGNOSIS — C9001 Multiple myeloma in remission: Secondary | ICD-10-CM

## 2023-01-20 DIAGNOSIS — R5383 Other fatigue: Secondary | ICD-10-CM | POA: Diagnosis not present

## 2023-01-20 DIAGNOSIS — Z79899 Other long term (current) drug therapy: Secondary | ICD-10-CM | POA: Insufficient documentation

## 2023-01-20 DIAGNOSIS — Z5112 Encounter for antineoplastic immunotherapy: Secondary | ICD-10-CM | POA: Insufficient documentation

## 2023-01-20 DIAGNOSIS — Z5111 Encounter for antineoplastic chemotherapy: Secondary | ICD-10-CM

## 2023-01-20 DIAGNOSIS — Z8249 Family history of ischemic heart disease and other diseases of the circulatory system: Secondary | ICD-10-CM | POA: Insufficient documentation

## 2023-01-20 DIAGNOSIS — N1831 Chronic kidney disease, stage 3a: Secondary | ICD-10-CM | POA: Insufficient documentation

## 2023-01-20 DIAGNOSIS — Z79620 Long term (current) use of immunosuppressive biologic: Secondary | ICD-10-CM | POA: Insufficient documentation

## 2023-01-20 DIAGNOSIS — M549 Dorsalgia, unspecified: Secondary | ICD-10-CM | POA: Diagnosis not present

## 2023-01-20 DIAGNOSIS — Z823 Family history of stroke: Secondary | ICD-10-CM | POA: Insufficient documentation

## 2023-01-20 DIAGNOSIS — Z9089 Acquired absence of other organs: Secondary | ICD-10-CM | POA: Insufficient documentation

## 2023-01-20 LAB — CBC WITH DIFFERENTIAL/PLATELET
Abs Immature Granulocytes: 0.01 10*3/uL (ref 0.00–0.07)
Basophils Absolute: 0 10*3/uL (ref 0.0–0.1)
Basophils Relative: 1 %
Eosinophils Absolute: 0.1 10*3/uL (ref 0.0–0.5)
Eosinophils Relative: 2 %
HCT: 31.2 % — ABNORMAL LOW (ref 36.0–46.0)
Hemoglobin: 10.1 g/dL — ABNORMAL LOW (ref 12.0–15.0)
Immature Granulocytes: 0 %
Lymphocytes Relative: 34 %
Lymphs Abs: 1.6 10*3/uL (ref 0.7–4.0)
MCH: 32.7 pg (ref 26.0–34.0)
MCHC: 32.4 g/dL (ref 30.0–36.0)
MCV: 101 fL — ABNORMAL HIGH (ref 80.0–100.0)
Monocytes Absolute: 0.2 10*3/uL (ref 0.1–1.0)
Monocytes Relative: 5 %
Neutro Abs: 2.7 10*3/uL (ref 1.7–7.7)
Neutrophils Relative %: 58 %
Platelets: 208 10*3/uL (ref 150–400)
RBC: 3.09 MIL/uL — ABNORMAL LOW (ref 3.87–5.11)
RDW: 12.8 % (ref 11.5–15.5)
WBC: 4.7 10*3/uL (ref 4.0–10.5)
nRBC: 0 % (ref 0.0–0.2)

## 2023-01-20 LAB — COMPREHENSIVE METABOLIC PANEL WITH GFR
ALT: 15 U/L (ref 0–44)
AST: 26 U/L (ref 15–41)
Albumin: 3.8 g/dL (ref 3.5–5.0)
Alkaline Phosphatase: 41 U/L (ref 38–126)
Anion gap: 9 (ref 5–15)
BUN: 17 mg/dL (ref 8–23)
CO2: 24 mmol/L (ref 22–32)
Calcium: 8.9 mg/dL (ref 8.9–10.3)
Chloride: 108 mmol/L (ref 98–111)
Creatinine, Ser: 0.96 mg/dL (ref 0.44–1.00)
GFR, Estimated: 57 mL/min — ABNORMAL LOW
Glucose, Bld: 91 mg/dL (ref 70–99)
Potassium: 3.6 mmol/L (ref 3.5–5.1)
Sodium: 141 mmol/L (ref 135–145)
Total Bilirubin: 0.6 mg/dL (ref 0.3–1.2)
Total Protein: 6.4 g/dL — ABNORMAL LOW (ref 6.5–8.1)

## 2023-01-20 MED ORDER — DARATUMUMAB-HYALURONIDASE-FIHJ 1800-30000 MG-UT/15ML ~~LOC~~ SOLN
1800.0000 mg | Freq: Once | SUBCUTANEOUS | Status: AC
  Administered 2023-01-20: 1800 mg via SUBCUTANEOUS
  Filled 2023-01-20: qty 15

## 2023-01-20 MED ORDER — DEXAMETHASONE 4 MG PO TABS: 20.0000 mg | ORAL_TABLET | ORAL | 1 refills | Status: DC

## 2023-01-20 MED ORDER — DIPHENHYDRAMINE HCL 25 MG PO CAPS
50.0000 mg | ORAL_CAPSULE | Freq: Once | ORAL | Status: AC
  Administered 2023-01-20: 50 mg via ORAL
  Filled 2023-01-20: qty 2

## 2023-01-20 MED ORDER — ACETAMINOPHEN 325 MG PO TABS
650.0000 mg | ORAL_TABLET | Freq: Once | ORAL | Status: AC
  Administered 2023-01-20: 650 mg via ORAL
  Filled 2023-01-20: qty 2

## 2023-01-20 NOTE — Assessment & Plan Note (Signed)
Chemotherapy plan as listed as above. 

## 2023-01-20 NOTE — Assessment & Plan Note (Addendum)
Multiple myeloma, IgA kappa, complex hyperploid gain of 1q, S/p Daratumumab Rd.   Revlimid D1-21,  bone marrow biopsy results were reviewed and discussed with patient - 3% plasma cell, no M protein. She was in CR  Labs are reviewed and discussed with patient.  Lab Results  Component Value Date   MPROTEIN 0.5 (H) 12/23/2022   MPROTEIN 0.5 (H) 11/25/2022   MPROTEIN Comment (A) 10/28/2022   KAPLAMBRATIO 2.56 (H) 12/23/2022   KAPLAMBRATIO 2.49 (H) 11/25/2022   KAPLAMBRATIO 2.63 (H) 10/28/2022    M protein has slightly increased from nadir.  Stable light chain ratio. Proceed with  Daratumumab monotherapy maintenance - she takes home supply of Dexamethasone prior to her Daratumumab.  Continue asprin 81mg  daily and Acyclovir 400mg  BID.

## 2023-01-20 NOTE — Assessment & Plan Note (Signed)
Stable. Continue monitor 

## 2023-01-20 NOTE — Patient Instructions (Signed)
[facility]  Discharge Instructions: Thank you for choosing [facility] to provide your oncology and hematology care.  If you have a lab appointment with the Cancer Center, please go directly to the Cancer Center and check in at the registration area.  Wear comfortable clothing and clothing appropriate for easy access to any Portacath or PICC line.   We strive to give you quality time with your provider. You may need to reschedule your appointment if you arrive late (15 or more minutes).  Arriving late affects you and other patients whose appointments are after yours.  Also, if you miss three or more appointments without notifying the office, you may be dismissed from the clinic at the provider's discretion.      For prescription refill requests, have your pharmacy contact our office and allow 72 hours for refills to be completed.    Today you received the following chemotherapy and/or immunotherapy agents- daratumumab      To help prevent nausea and vomiting after your treatment, we encourage you to take your nausea medication as directed.  BELOW ARE SYMPTOMS THAT SHOULD BE REPORTED IMMEDIATELY: *FEVER GREATER THAN 100.4 F (38 C) OR HIGHER *CHILLS OR SWEATING *NAUSEA AND VOMITING THAT IS NOT CONTROLLED WITH YOUR NAUSEA MEDICATION *UNUSUAL SHORTNESS OF BREATH *UNUSUAL BRUISING OR BLEEDING *URINARY PROBLEMS (pain or burning when urinating, or frequent urination) *BOWEL PROBLEMS (unusual diarrhea, constipation, pain near the anus) TENDERNESS IN MOUTH AND THROAT WITH OR WITHOUT PRESENCE OF ULCERS (sore throat, sores in mouth, or a toothache) UNUSUAL RASH, SWELLING OR PAIN  UNUSUAL VAGINAL DISCHARGE OR ITCHING   Items with * indicate a potential emergency and should be followed up as soon as possible or go to the Emergency Department if any problems should occur.  Please show the CHEMOTHERAPY ALERT CARD or IMMUNOTHERAPY ALERT CARD at check-in  to the Emergency Department and triage nurse.  Should you have questions after your visit or need to cancel or reschedule your appointment, please contact [facility] [place] [facility]  [phone] and follow the prompts.  Office hours are 8:00 a.m. to 4:30 p.m. Monday - Friday. Please note that voicemails left after 4:00 p.m. may not be returned until the following business day.  We are closed weekends and major holidays. You have access to a nurse at all times for urgent questions. Please call the main number to the clinic [phone] and follow the prompts.  For any non-urgent questions, you may also contact your provider using MyChart. We now offer e-Visits for anyone 18 and older to request care online for non-urgent symptoms. For details visit [email or web address].   Also download the MyChart app! Go to the app store, search "MyChart", open the app, select [facility], and log in with your MyChart username and password.    

## 2023-01-20 NOTE — Assessment & Plan Note (Signed)
recommend patient to avoid nephrotoxin.  Encourage oral hydration. 

## 2023-01-20 NOTE — Progress Notes (Signed)
Needs a refill of her Dexamethasone.  Singular is out of date but still has plenty.  Can they continue to use those?

## 2023-01-20 NOTE — Progress Notes (Signed)
Hematology/Oncology Progress note [phone] Fax:([phone]     CHIEF COMPLAINTS/REASON FOR VISIT:  Follow-up for multiple myeloma  ASSESSMENT & PLAN:   Cancer Staging  Multiple myeloma (HCC) Staging form: Plasma Cell Myeloma and Plasma Cell Disorders, AJCC 8th Edition - Clinical stage from 08/22/2021: RISS Stage II (Beta-2-microglobulin (mg/L): 4.1, Albumin (g/dL): 2.8, ISS: Stage II, High-risk cytogenetics: Absent, LDH: Normal) - Signed by Rickard Patience, MD on 09/06/2021   Multiple myeloma (HCC) Multiple myeloma, IgA kappa, complex hyperploid gain of 1q, S/p Daratumumab Rd.   Revlimid D1-21,  bone marrow biopsy results were reviewed and discussed with patient - 3% plasma cell, no M protein. She was in CR  Labs are reviewed and discussed with patient.  Lab Results  Component Value Date   MPROTEIN 0.5 (H) 12/23/2022   MPROTEIN 0.5 (H) 11/25/2022   MPROTEIN Comment (A) 10/28/2022   KAPLAMBRATIO 2.56 (H) 12/23/2022   KAPLAMBRATIO 2.49 (H) 11/25/2022   KAPLAMBRATIO 2.63 (H) 10/28/2022    M protein has slightly increased from nadir.  Stable light chain ratio. Proceed with  Daratumumab monotherapy maintenance - she takes home supply of Dexamethasone prior to her Daratumumab.  Continue asprin 81mg  daily and Acyclovir 400mg  BID.   Bone lesion Continue Q3 months Zometa-  next due July  2024 Recommend calcium 1200 mg daily.  Normocytic anemia Stable Continue monitor.   Stage 3a chronic kidney disease (HCC) recommend patient to avoid nephrotoxin.  Encourage oral hydration.  Encounter for antineoplastic chemotherapy Chemotherapy plan as listed as above.  No orders of the defined types were placed in this encounter.    Follow up  Lab/MD/Dara in 4 weeks  All questions were answered. The patient knows to call the clinic with any problems, questions or concerns.  Rickard Patience, MD, PhD [facility] Hematology Oncology 01/20/2023     HISTORY OF PRESENTING ILLNESS:    Stephanie Delacruz is a  89 y.o.  female presents for follow up of multiple myeloma.  Oncology History  Multiple myeloma (HCC)  08/22/2021 Initial Diagnosis   Multiple myeloma (HCC) IgA kappa Initial M protein 5.2, free kappa light chain ratio 89.2, Kappa/lambda ratio 13.31   08/22/2021 Cancer Staging   Staging form: Plasma Cell Myeloma and Plasma Cell Disorders, AJCC 8th Edition - Clinical stage from 08/22/2021: RISS Stage II (Beta-2-microglobulin (mg/L): 4.1, Albumin (g/dL): 2.8, ISS: Stage II, High-risk cytogenetics: Absent, LDH: Normal) - Signed by Rickard Patience, MD on 09/06/2021 Stage prefix: Initial diagnosis Beta 2 microglobulin range (mg/L): 3.5 to 5.49 Albumin range (g/dL): Less than 3.5 Cytogenetics: 1q addition   08/26/2021 - 09/01/2022 Chemotherapy   08/26/21 MYELOMA Daratumumab SQ q28d  09/06/2021 added Revlimid 10mg  x 14 days.  09/27/2021 starting cycle 2 Revlimid 10 mg 21 days on, 7 days off. Weekly Dexamethasone.    08/26/2021 Bone Marrow Biopsy   Bone marrow biopsy showed hypercellular marrow with plasma cell neoplasm. 67% plasma cells in the aspirate. Cytogenetics showed complex hyperplasia, gain of 1 q.    07/23/2022 Bone Marrow Biopsy   Bone marrow biopsy showed Hypercellular bone marrow for age with trilineage hematopoiesis and 3% plasma cells  Cytogenetics are normal.    S/p thyroid lesion biopsy, obtained by endocrinologist. + AUS INTERVAL HISTORY Breonna L Liston is a 89 y.o. female who has above history reviewed by me today presents for follow up visit for management of multiple myeloma She was accompanied by her daughter today.  She denies any nausea vomiting diarrhea.   No new complaints.  Review of Systems  Constitutional:  Positive for fatigue. Negative for appetite change, chills and fever.  HENT:   Negative for hearing loss and voice change.   Eyes:  Negative for eye problems.  Respiratory:  Negative for chest tightness and cough.   Cardiovascular:  Negative for  chest pain.  Gastrointestinal:  Negative for abdominal distention, abdominal pain and blood in stool.  Endocrine: Negative for hot flashes.  Genitourinary:  Negative for difficulty urinating and frequency.   Musculoskeletal:  Positive for back pain. Negative for arthralgias.  Skin:  Negative for itching and rash.  Neurological:  Negative for extremity weakness.  Hematological:  Negative for adenopathy.  Psychiatric/Behavioral:  Negative for confusion.     MEDICAL HISTORY:  Past Medical History:  Diagnosis Date   Anemia    GERD (gastroesophageal reflux disease)    Hypercholesterolemia    Hypertension    Renal insufficiency     SURGICAL HISTORY: Past Surgical History:  Procedure Laterality Date   TONSILLECTOMY     TUBAL LIGATION      SOCIAL HISTORY: Social History   Socioeconomic History   Marital status: Married    Spouse name: Not on file   Number of children: Not on file   Years of education: Not on file   Highest education level: Not on file  Occupational History   Not on file  Tobacco Use   Smoking status: Never   Smokeless tobacco: Never  Vaping Use   Vaping Use: Never used  Substance and Sexual Activity   Alcohol use: No   Drug use: No   Sexual activity: Not on file  Other Topics Concern   Not on file  Social History Narrative   Lives with husband Stephanie Delacruz and daughter Stephanie Delacruz. No pets.    Social Determinants of Health   Financial Resource Strain: Not on file  Food Insecurity: Not on file  Transportation Needs: Not on file  Physical Activity: Not on file  Stress: Not on file  Social Connections: Not on file  Intimate Partner Violence: Not on file    FAMILY HISTORY: Family History  Problem Relation Age of Onset   Hypertension Mother    Arthritis Mother    Hypertension Father    Stroke Father    Breast cancer Niece     ALLERGIES:  has No Known Allergies.  MEDICATIONS:  Current Outpatient Medications  Medication Sig Dispense Refill    acyclovir (ZOVIRAX) 400 MG tablet TAKE 1 TABLET(400 MG) BY MOUTH TWICE DAILY 60 tablet 11   amLODipine (NORVASC) 2.5 MG tablet Take 2 tablets (5 mg total) by mouth daily. 180 tablet 2   ascorbic acid (VITAMIN C) 250 MG tablet daily Take 2 tablets every day by oral route.     aspirin 81 MG EC tablet Take 81 mg by mouth daily.     Calcium Carb-Cholecalciferol (CALCIUM 600+D) 600-20 MG-MCG TABS Take 1 tablet by mouth 3 (three) times daily.     donepezil (ARICEPT) 10 MG tablet TAKE 1 TABLET(10 MG) BY MOUTH DAILY 90 tablet 2   fenofibrate (TRICOR) 48 MG tablet TAKE 1 TABLET(48 MG) BY MOUTH DAILY 90 tablet 2   hydrochlorothiazide (HYDRODIURIL) 25 MG tablet TAKE 1/2 TABLET BY MOUTH EVERY DAY 45 tablet 1   memantine (NAMENDA) 5 MG tablet Take 5 mg by mouth 2 (two) times daily.     methimazole (TAPAZOLE) 5 MG tablet Take 1 tablet (5 mg total) by mouth daily. 90 tablet 3   metoprolol succinate (TOPROL-XL) 25  MG 24 hr tablet TAKE 2 TABLETS(50 MG) BY MOUTH DAILY 180 tablet 1   montelukast (SINGULAIR) 10 MG tablet Take 1 tablet (10 mg total) by mouth See admin instructions. Take 1 tablet one day before Daratumumab injection. 60 tablet 0   Multiple Vitamins-Minerals (WOMENS MULTIVITAMIN PO) Take 1 tablet by mouth daily.     omeprazole (PRILOSEC) 20 MG capsule TAKE 1 CAPSULE(20 MG) BY MOUTH DAILY 90 capsule 3   prochlorperazine (COMPAZINE) 10 MG tablet Take 10 mg by mouth every 6 (six) hours as needed for nausea or vomiting.     valsartan (DIOVAN) 160 MG tablet Take 1 tablet (160 mg total) by mouth daily. 90 tablet 1   dexamethasone (DECADRON) 4 MG tablet Take 5 tablets (20 mg total) by mouth See admin instructions. Take 20mg  prior to each Daratumumab treatment. 30 tablet 1   fexofenadine (ALLEGRA) 180 MG tablet Take by mouth. (Patient not taking: Reported on 01/20/2023)     No current facility-administered medications for this visit.     PHYSICAL EXAMINATION: ECOG PERFORMANCE STATUS: 1 - Symptomatic but  completely ambulatory Vitals:   01/20/23 0938  BP: (!) 159/77  Pulse: 65  Temp: (!) 97.1 F (36.2 C)  SpO2: 100%   Filed Weights   01/20/23 0938  Weight: 147 lb 1.6 oz (66.7 kg)    Physical Exam Constitutional:      General: She is not in acute distress. HENT:     Head: Normocephalic and atraumatic.  Eyes:     General: No scleral icterus. Cardiovascular:     Rate and Rhythm: Normal rate and regular rhythm.     Heart sounds: Normal heart sounds.  Pulmonary:     Effort: Pulmonary effort is normal. No respiratory distress.     Breath sounds: No wheezing.  Abdominal:     General: Bowel sounds are normal. There is no distension.     Palpations: Abdomen is soft.  Musculoskeletal:        General: No deformity. Normal range of motion.     Cervical back: Normal range of motion and neck supple.  Skin:    General: Skin is warm and dry.     Findings: No erythema or rash.  Neurological:     Mental Status: She is alert and oriented to person, place, and time. Mental status is at baseline.     Cranial Nerves: No cranial nerve deficit.     Coordination: Coordination normal.  Psychiatric:        Mood and Affect: Mood normal.     LABORATORY DATA:  I have reviewed the data as listed    Latest Ref Rng & Units 01/20/2023    9:26 AM 12/23/2022   10:11 AM 11/25/2022    1:17 PM  CBC  WBC 4.0 - 10.5 K/uL 4.7  4.7  6.0   Hemoglobin 12.0 - 15.0 g/dL 16.1  09.6  04.5   Hematocrit 36.0 - 46.0 % 31.2  33.1  32.4   Platelets 150 - 400 K/uL 208  236  228       Latest Ref Rng & Units 01/20/2023    9:26 AM 12/23/2022   10:11 AM 11/25/2022    1:17 PM  CMP  Glucose 70 - 99 mg/dL 91  99  409   BUN 8 - 23 mg/dL 17  18  20    Creatinine 0.44 - 1.00 mg/dL 8.11  9.14  7.82   Sodium 135 - 145 mmol/L 141  139  137  Potassium 3.5 - 5.1 mmol/L 3.6  3.7  3.9   Chloride 98 - 111 mmol/L 108  103  104   CO2 22 - 32 mmol/L 24  24  25    Calcium 8.9 - 10.3 mg/dL 8.9  9.6  9.4   Total Protein 6.5 - 8.1  g/dL 6.4  7.1  7.0   Total Bilirubin 0.3 - 1.2 mg/dL 0.6  0.4  0.3   Alkaline Phos 38 - 126 U/L 41  43  50   AST 15 - 41 U/L 26  25  27    ALT 0 - 44 U/L 15  18  17       Iron/TIBC/Ferritin/ %Sat    Component Value Date/Time   IRON 54 07/24/2021 1212   TIBC 277 07/24/2021 1212   FERRITIN 93 07/24/2021 1212   IRONPCTSAT 20 07/24/2021 1212       RADIOGRAPHIC STUDIES: I have personally reviewed the radiological images as listed and agreed with the findings in the report. No results found.

## 2023-01-20 NOTE — Assessment & Plan Note (Signed)
Continue Q3 months Zometa-  next due July  2024 Recommend calcium 1200 mg daily.

## 2023-01-21 LAB — KAPPA/LAMBDA LIGHT CHAINS
Kappa free light chain: 8.5 mg/L (ref 3.3–19.4)
Kappa, lambda light chain ratio: 1.39 (ref 0.26–1.65)
Lambda free light chains: 6.1 mg/L (ref 5.7–26.3)

## 2023-01-26 LAB — MULTIPLE MYELOMA PANEL, SERUM
Albumin SerPl Elph-Mcnc: 3.6 g/dL (ref 2.9–4.4)
Albumin/Glob SerPl: 1.6 (ref 0.7–1.7)
Alpha 1: 0.2 g/dL (ref 0.0–0.4)
Alpha2 Glob SerPl Elph-Mcnc: 0.7 g/dL (ref 0.4–1.0)
B-Globulin SerPl Elph-Mcnc: 1.1 g/dL (ref 0.7–1.3)
Gamma Glob SerPl Elph-Mcnc: 0.4 g/dL (ref 0.4–1.8)
Globulin, Total: 2.4 g/dL (ref 2.2–3.9)
IgA: 229 mg/dL (ref 64–422)
IgG (Immunoglobin G), Serum: 528 mg/dL — ABNORMAL LOW (ref 586–1602)
IgM (Immunoglobulin M), Srm: 14 mg/dL — ABNORMAL LOW (ref 26–217)
M Protein SerPl Elph-Mcnc: 0.2 g/dL — ABNORMAL HIGH
Total Protein ELP: 6 g/dL (ref 6.0–8.5)

## 2023-02-03 ENCOUNTER — Encounter: Payer: Self-pay | Admitting: Student

## 2023-02-04 ENCOUNTER — Encounter: Payer: Self-pay | Admitting: Student

## 2023-02-05 ENCOUNTER — Ambulatory Visit
Admission: RE | Admit: 2023-02-05 | Discharge: 2023-02-05 | Disposition: A | Discharge status: Home / Self Care | Payer: Medicare Other | Source: Ambulatory Visit | Attending: Student | Admitting: Student

## 2023-02-05 DIAGNOSIS — R413 Other amnesia: Secondary | ICD-10-CM

## 2023-02-17 ENCOUNTER — Inpatient Hospital Stay: Payer: Medicare Other

## 2023-02-17 ENCOUNTER — Inpatient Hospital Stay: Payer: Medicare Other | Attending: Oncology

## 2023-02-17 ENCOUNTER — Encounter: Payer: Self-pay | Admitting: Oncology

## 2023-02-17 ENCOUNTER — Inpatient Hospital Stay (HOSPITAL_BASED_OUTPATIENT_CLINIC_OR_DEPARTMENT_OTHER): Payer: Medicare Other | Admitting: Oncology

## 2023-02-17 VITALS — BP 151/67 | HR 60 | Temp 97.1°F | Resp 18 | Wt 147.1 lb

## 2023-02-17 DIAGNOSIS — M549 Dorsalgia, unspecified: Secondary | ICD-10-CM | POA: Diagnosis not present

## 2023-02-17 DIAGNOSIS — I10 Essential (primary) hypertension: Secondary | ICD-10-CM | POA: Insufficient documentation

## 2023-02-17 DIAGNOSIS — N1832 Chronic kidney disease, stage 3b: Secondary | ICD-10-CM | POA: Diagnosis not present

## 2023-02-17 DIAGNOSIS — K219 Gastro-esophageal reflux disease without esophagitis: Secondary | ICD-10-CM | POA: Diagnosis not present

## 2023-02-17 DIAGNOSIS — D649 Anemia, unspecified: Secondary | ICD-10-CM

## 2023-02-17 DIAGNOSIS — Z823 Family history of stroke: Secondary | ICD-10-CM | POA: Diagnosis not present

## 2023-02-17 DIAGNOSIS — Z79899 Other long term (current) drug therapy: Secondary | ICD-10-CM | POA: Insufficient documentation

## 2023-02-17 DIAGNOSIS — C9001 Multiple myeloma in remission: Secondary | ICD-10-CM

## 2023-02-17 DIAGNOSIS — Z8249 Family history of ischemic heart disease and other diseases of the circulatory system: Secondary | ICD-10-CM | POA: Insufficient documentation

## 2023-02-17 DIAGNOSIS — M899 Disorder of bone, unspecified: Secondary | ICD-10-CM

## 2023-02-17 DIAGNOSIS — R5383 Other fatigue: Secondary | ICD-10-CM | POA: Diagnosis not present

## 2023-02-17 DIAGNOSIS — Z5112 Encounter for antineoplastic immunotherapy: Secondary | ICD-10-CM | POA: Insufficient documentation

## 2023-02-17 DIAGNOSIS — Z79624 Long term (current) use of inhibitors of nucleotide synthesis: Secondary | ICD-10-CM | POA: Insufficient documentation

## 2023-02-17 DIAGNOSIS — Z9089 Acquired absence of other organs: Secondary | ICD-10-CM | POA: Diagnosis not present

## 2023-02-17 DIAGNOSIS — Z8261 Family history of arthritis: Secondary | ICD-10-CM | POA: Diagnosis not present

## 2023-02-17 DIAGNOSIS — N1831 Chronic kidney disease, stage 3a: Secondary | ICD-10-CM

## 2023-02-17 DIAGNOSIS — C9000 Multiple myeloma not having achieved remission: Secondary | ICD-10-CM | POA: Insufficient documentation

## 2023-02-17 DIAGNOSIS — Z5111 Encounter for antineoplastic chemotherapy: Secondary | ICD-10-CM

## 2023-02-17 DIAGNOSIS — E7800 Pure hypercholesterolemia, unspecified: Secondary | ICD-10-CM | POA: Insufficient documentation

## 2023-02-17 DIAGNOSIS — Z79620 Long term (current) use of immunosuppressive biologic: Secondary | ICD-10-CM | POA: Diagnosis not present

## 2023-02-17 DIAGNOSIS — Z803 Family history of malignant neoplasm of breast: Secondary | ICD-10-CM | POA: Insufficient documentation

## 2023-02-17 LAB — COMPREHENSIVE METABOLIC PANEL
ALT: 15 U/L (ref 0–44)
AST: 23 U/L (ref 15–41)
Albumin: 3.9 g/dL (ref 3.5–5.0)
Alkaline Phosphatase: 41 U/L (ref 38–126)
Anion gap: 8 (ref 5–15)
BUN: 16 mg/dL (ref 8–23)
CO2: 26 mmol/L (ref 22–32)
Calcium: 9.4 mg/dL (ref 8.9–10.3)
Chloride: 107 mmol/L (ref 98–111)
Creatinine, Ser: 1.02 mg/dL — ABNORMAL HIGH (ref 0.44–1.00)
GFR, Estimated: 53 mL/min — ABNORMAL LOW (ref >60)
Glucose, Bld: 89 mg/dL (ref 70–99)
Potassium: 3.8 mmol/L (ref 3.5–5.1)
Sodium: 141 mmol/L (ref 135–145)
Total Bilirubin: 0.3 mg/dL (ref 0.3–1.2)
Total Protein: 7 g/dL (ref 6.5–8.1)

## 2023-02-17 LAB — CBC WITH DIFFERENTIAL/PLATELET
Abs Immature Granulocytes: 0.01 10*3/uL (ref 0.00–0.07)
Basophils Absolute: 0 10*3/uL (ref 0.0–0.1)
Basophils Relative: 1 %
Eosinophils Absolute: 0.1 10*3/uL (ref 0.0–0.5)
Eosinophils Relative: 2 %
HCT: 33.6 % — ABNORMAL LOW (ref 36.0–46.0)
Hemoglobin: 10.8 g/dL — ABNORMAL LOW (ref 12.0–15.0)
Immature Granulocytes: 0 %
Lymphocytes Relative: 35 %
Lymphs Abs: 1.4 10*3/uL (ref 0.7–4.0)
MCH: 32.4 pg (ref 26.0–34.0)
MCHC: 32.1 g/dL (ref 30.0–36.0)
MCV: 100.9 fL — ABNORMAL HIGH (ref 80.0–100.0)
Monocytes Absolute: 0.2 10*3/uL (ref 0.1–1.0)
Monocytes Relative: 4 %
Neutro Abs: 2.4 10*3/uL (ref 1.7–7.7)
Neutrophils Relative %: 58 %
Platelets: 211 10*3/uL (ref 150–400)
RBC: 3.33 MIL/uL — ABNORMAL LOW (ref 3.87–5.11)
RDW: 12.4 % (ref 11.5–15.5)
WBC: 4.1 10*3/uL (ref 4.0–10.5)
nRBC: 0 % (ref 0.0–0.2)

## 2023-02-17 MED ORDER — DIPHENHYDRAMINE HCL 25 MG PO CAPS
50.0000 mg | ORAL_CAPSULE | Freq: Once | ORAL | Status: AC
  Administered 2023-02-17: 50 mg via ORAL
  Filled 2023-02-17: qty 2

## 2023-02-17 MED ORDER — SODIUM CHLORIDE 0.9 % IV SOLN
INTRAVENOUS | Status: DC
  Filled 2023-02-17: qty 250

## 2023-02-17 MED ORDER — ACETAMINOPHEN 325 MG PO TABS
650.0000 mg | ORAL_TABLET | Freq: Once | ORAL | Status: AC
  Administered 2023-02-17: 650 mg via ORAL
  Filled 2023-02-17: qty 2

## 2023-02-17 MED ORDER — DARATUMUMAB-HYALURONIDASE-FIHJ 1800-30000 MG-UT/15ML ~~LOC~~ SOLN
1800.0000 mg | Freq: Once | SUBCUTANEOUS | Status: AC
  Administered 2023-02-17: 1800 mg via SUBCUTANEOUS
  Filled 2023-02-17: qty 15

## 2023-02-17 MED ORDER — ZOLEDRONIC ACID 4 MG/5ML IV CONC
3.3000 mg | Freq: Once | INTRAVENOUS | Status: AC
  Administered 2023-02-17: 3.3 mg via INTRAVENOUS
  Filled 2023-02-17: qty 4.13

## 2023-02-17 NOTE — Assessment & Plan Note (Signed)
Chemotherapy plan as listed as above. 

## 2023-02-17 NOTE — Assessment & Plan Note (Addendum)
Multiple myeloma, IgA kappa, complex hyperploid gain of 1q, S/p Daratumumab Rd.   Revlimid D1-21,  bone marrow biopsy results were reviewed and discussed with patient - 3% plasma cell, no M protein. She was in CR  Labs are reviewed and discussed with patient.  Lab Results  Component Value Date   MPROTEIN 0.2 (H) 01/20/2023   MPROTEIN 0.5 (H) 12/23/2022   MPROTEIN 0.5 (H) 11/25/2022   KAPLAMBRATIO 1.39 01/20/2023   KAPLAMBRATIO 2.56 (H) 12/23/2022   KAPLAMBRATIO 2.49 (H) 11/25/2022    M protein has slightly increased from nadir.  Stable light chain ratio. Proceed with  Daratumumab monotherapy maintenance - she takes home supply of Dexamethasone prior to her Daratumumab.  On asprin 81mg  daily and Acyclovir 400mg  BID.

## 2023-02-17 NOTE — Assessment & Plan Note (Signed)
Stable. Continue monitor

## 2023-02-17 NOTE — Progress Notes (Signed)
Hematology/Oncology Progress note Telephone:([phone] Fax:(336) [phone]     CHIEF COMPLAINTS/REASON FOR VISIT:  Follow-up for multiple myeloma  ASSESSMENT & PLAN:   Cancer Staging  Multiple myeloma (HCC) Staging form: Plasma Cell Myeloma and Plasma Cell Disorders, AJCC 8th Edition - Clinical stage from 08/22/2021: RISS Stage II (Beta-2-microglobulin (mg/L): 4.1, Albumin (g/dL): 2.8, ISS: Stage II, High-risk cytogenetics: Absent, LDH: Normal) - Signed by Rickard Patience, MD on 09/06/2021   Multiple myeloma (HCC) Multiple myeloma, IgA kappa, complex hyperploid gain of 1q, S/p Daratumumab Rd.   Revlimid D1-21,  bone marrow biopsy results were reviewed and discussed with patient - 3% plasma cell, no M protein. She was in CR  Labs are reviewed and discussed with patient.  Lab Results  Component Value Date   MPROTEIN 0.2 (H) 01/20/2023   MPROTEIN 0.5 (H) 12/23/2022   MPROTEIN 0.5 (H) 11/25/2022   KAPLAMBRATIO 1.39 01/20/2023   KAPLAMBRATIO 2.56 (H) 12/23/2022   KAPLAMBRATIO 2.49 (H) 11/25/2022    M protein has slightly increased from nadir.  Stable light chain ratio. Proceed with  Daratumumab monotherapy maintenance - she takes home supply of Dexamethasone prior to her Daratumumab.  On asprin 81mg  daily and Acyclovir 400mg  BID.   Bone lesion Continue Q3 months Zometa- today  next due Oct   2024 Recommend calcium 1200 mg daily.  Normocytic anemia Stable Continue monitor.   Stage 3a chronic kidney disease (HCC) recommend patient to avoid nephrotoxin.  Encourage oral hydration.  Encounter for antineoplastic chemotherapy Chemotherapy plan as listed as above.  No orders of the defined types were placed in this encounter.    Follow up  Lab/MD/Dara in 4 weeks  All questions were answered. The patient knows to call the clinic with any problems, questions or concerns.  Rickard Patience, MD, PhD [facility] Hematology Oncology 02/17/2023     HISTORY OF PRESENTING ILLNESS:   Stephanie Delacruz is a  89 y.o.  female presents for follow up of multiple myeloma.  Oncology History  Multiple myeloma (HCC)  08/22/2021 Initial Diagnosis   Multiple myeloma (HCC) IgA kappa Initial M protein 5.2, free kappa light chain ratio 89.2, Kappa/lambda ratio 13.31   08/22/2021 Cancer Staging   Staging form: Plasma Cell Myeloma and Plasma Cell Disorders, AJCC 8th Edition - Clinical stage from 08/22/2021: RISS Stage II (Beta-2-microglobulin (mg/L): 4.1, Albumin (g/dL): 2.8, ISS: Stage II, High-risk cytogenetics: Absent, LDH: Normal) - Signed by Rickard Patience, MD on 09/06/2021 Stage prefix: Initial diagnosis Beta 2 microglobulin range (mg/L): 3.5 to 5.49 Albumin range (g/dL): Less than 3.5 Cytogenetics: 1q addition   08/26/2021 - 09/01/2022 Chemotherapy   08/26/21 MYELOMA Daratumumab SQ q28d  09/06/2021 added Revlimid 10mg  x 14 days.  09/27/2021 starting cycle 2 Revlimid 10 mg 21 days on, 7 days off. Weekly Dexamethasone.    08/26/2021 Bone Marrow Biopsy   Bone marrow biopsy showed hypercellular marrow with plasma cell neoplasm. 67% plasma cells in the aspirate. Cytogenetics showed complex hyperplasia, gain of 1 q.    07/23/2022 Bone Marrow Biopsy   Bone marrow biopsy showed Hypercellular bone marrow for age with trilineage hematopoiesis and 3% plasma cells  Cytogenetics are normal.    S/p thyroid lesion biopsy, obtained by endocrinologist. + AUS INTERVAL HISTORY Stephanie Delacruz is a 89 y.o. female who has above history reviewed by me today presents for follow up visit for management of multiple myeloma She was accompanied by her daughter today.  She denies any nausea vomiting diarrhea.   No new complaints.  Review of Systems  Constitutional:  Positive for fatigue. Negative for appetite change, chills and fever.  HENT:   Negative for hearing loss and voice change.   Eyes:  Negative for eye problems.  Respiratory:  Negative for chest tightness and cough.   Cardiovascular:  Negative for chest pain.   Gastrointestinal:  Negative for abdominal distention, abdominal pain and blood in stool.  Endocrine: Negative for hot flashes.  Genitourinary:  Negative for difficulty urinating and frequency.   Musculoskeletal:  Positive for back pain. Negative for arthralgias.  Skin:  Negative for itching and rash.  Neurological:  Negative for extremity weakness.  Hematological:  Negative for adenopathy.  Psychiatric/Behavioral:  Negative for confusion.     MEDICAL HISTORY:  Past Medical History:  Diagnosis Date   Anemia    GERD (gastroesophageal reflux disease)    Hypercholesterolemia    Hypertension    Renal insufficiency     SURGICAL HISTORY: Past Surgical History:  Procedure Laterality Date   TONSILLECTOMY     TUBAL LIGATION      SOCIAL HISTORY: Social History   Socioeconomic History   Marital status: Married    Spouse name: Not on file   Number of children: Not on file   Years of education: Not on file   Highest education level: Not on file  Occupational History   Not on file  Tobacco Use   Smoking status: Never   Smokeless tobacco: Never  Vaping Use   Vaping status: Never Used  Substance and Sexual Activity   Alcohol use: No   Drug use: No   Sexual activity: Not on file  Other Topics Concern   Not on file  Social History Narrative   Lives with husband Fayrene Fearing and daughter Marylene Land. No pets.    Social Determinants of Health   Financial Resource Strain: Not on file  Food Insecurity: Not on file  Transportation Needs: Not on file  Physical Activity: Not on file  Stress: Not on file  Social Connections: Not on file  Intimate Partner Violence: Not on file    FAMILY HISTORY: Family History  Problem Relation Age of Onset   Hypertension Mother    Arthritis Mother    Hypertension Father    Stroke Father    Breast cancer Niece     ALLERGIES:  has No Known Allergies.  MEDICATIONS:  Current Outpatient Medications  Medication Sig Dispense Refill   acyclovir  (ZOVIRAX) 400 MG tablet TAKE 1 TABLET(400 MG) BY MOUTH TWICE DAILY 60 tablet 11   amLODipine (NORVASC) 2.5 MG tablet Take 2 tablets (5 mg total) by mouth daily. 180 tablet 2   ascorbic acid (VITAMIN C) 250 MG tablet daily Take 2 tablets every day by oral route.     aspirin 81 MG EC tablet Take 81 mg by mouth daily.     Calcium Carb-Cholecalciferol (CALCIUM 600+D) 600-20 MG-MCG TABS Take 1 tablet by mouth 3 (three) times daily.     dexamethasone (DECADRON) 4 MG tablet Take 5 tablets (20 mg total) by mouth See admin instructions. Take 20mg  prior to each Daratumumab treatment. 30 tablet 1   donepezil (ARICEPT) 10 MG tablet TAKE 1 TABLET(10 MG) BY MOUTH DAILY 90 tablet 2   fenofibrate (TRICOR) 48 MG tablet TAKE 1 TABLET(48 MG) BY MOUTH DAILY 90 tablet 2   hydrochlorothiazide (HYDRODIURIL) 25 MG tablet TAKE 1/2 TABLET BY MOUTH EVERY DAY 45 tablet 1   memantine (NAMENDA) 5 MG tablet Take 5 mg by mouth 2 (two) times  daily.     methimazole (TAPAZOLE) 5 MG tablet Take 1 tablet (5 mg total) by mouth daily. 90 tablet 3   metoprolol succinate (TOPROL-XL) 25 MG 24 hr tablet TAKE 2 TABLETS(50 MG) BY MOUTH DAILY 180 tablet 1   montelukast (SINGULAIR) 10 MG tablet Take 1 tablet (10 mg total) by mouth See admin instructions. Take 1 tablet one day before Daratumumab injection. 60 tablet 0   Multiple Vitamins-Minerals (WOMENS MULTIVITAMIN PO) Take 1 tablet by mouth daily.     omeprazole (PRILOSEC) 20 MG capsule TAKE 1 CAPSULE(20 MG) BY MOUTH DAILY 90 capsule 3   prochlorperazine (COMPAZINE) 10 MG tablet Take 10 mg by mouth every 6 (six) hours as needed for nausea or vomiting.     valsartan (DIOVAN) 160 MG tablet Take 1 tablet (160 mg total) by mouth daily. 90 tablet 1   fexofenadine (ALLEGRA) 180 MG tablet Take by mouth. (Patient not taking: Reported on 01/20/2023)     No current facility-administered medications for this visit.   Facility-Administered Medications Ordered in Other Visits  Medication Dose Route  Frequency Provider Last Rate Last Admin   0.9 %  sodium chloride infusion   Intravenous Continuous Rickard Patience, MD   Stopped at 02/17/23 1244     PHYSICAL EXAMINATION: ECOG PERFORMANCE STATUS: 1 - Symptomatic but completely ambulatory Vitals:   02/17/23 1059  BP: (!) 151/67  Pulse: 60  Resp: 18  Temp: (!) 97.1 F (36.2 C)  SpO2: 100%   Filed Weights   02/17/23 1059  Weight: 147 lb 1.6 oz (66.7 kg)    Physical Exam Constitutional:      General: She is not in acute distress. HENT:     Head: Normocephalic and atraumatic.  Eyes:     General: No scleral icterus. Cardiovascular:     Rate and Rhythm: Normal rate and regular rhythm.     Heart sounds: Normal heart sounds.  Pulmonary:     Effort: Pulmonary effort is normal. No respiratory distress.     Breath sounds: No wheezing.  Abdominal:     General: Bowel sounds are normal. There is no distension.     Palpations: Abdomen is soft.  Musculoskeletal:        General: No deformity. Normal range of motion.     Cervical back: Normal range of motion and neck supple.  Skin:    General: Skin is warm and dry.     Findings: No erythema or rash.  Neurological:     Mental Status: She is alert and oriented to person, place, and time. Mental status is at baseline.     Cranial Nerves: No cranial nerve deficit.     Coordination: Coordination normal.  Psychiatric:        Mood and Affect: Mood normal.     LABORATORY DATA:  I have reviewed the data as listed    Latest Ref Rng & Units 02/17/2023   10:27 AM 01/20/2023    9:26 AM 12/23/2022   10:11 AM  CBC  WBC 4.0 - 10.5 K/uL 4.1  4.7  4.7   Hemoglobin 12.0 - 15.0 g/dL 30.8  65.7  84.6   Hematocrit 36.0 - 46.0 % 33.6  31.2  33.1   Platelets 150 - 400 K/uL 211  208  236       Latest Ref Rng & Units 02/17/2023   10:27 AM 01/20/2023    9:26 AM 12/23/2022   10:11 AM  CMP  Glucose 70 - 99 mg/dL 89  91  99   BUN 8 - 23 mg/dL 16  17  18    Creatinine 0.44 - 1.00 mg/dL 1.61  0.96  0.45    Sodium 135 - 145 mmol/L 141  141  139   Potassium 3.5 - 5.1 mmol/L 3.8  3.6  3.7   Chloride 98 - 111 mmol/L 107  108  103   CO2 22 - 32 mmol/L 26  24  24    Calcium 8.9 - 10.3 mg/dL 9.4  8.9  9.6   Total Protein 6.5 - 8.1 g/dL 7.0  6.4  7.1   Total Bilirubin 0.3 - 1.2 mg/dL 0.3  0.6  0.4   Alkaline Phos 38 - 126 U/L 41  41  43   AST 15 - 41 U/L 23  26  25    ALT 0 - 44 U/L 15  15  18       Iron/TIBC/Ferritin/ %Sat    Component Value Date/Time   IRON 54 07/24/2021 1212   TIBC 277 07/24/2021 1212   FERRITIN 93 07/24/2021 1212   IRONPCTSAT 20 07/24/2021 1212       RADIOGRAPHIC STUDIES: I have personally reviewed the radiological images as listed and agreed with the findings in the report. MR BRAIN WO CONTRAST  Result Date: 02/05/2023 CLINICAL DATA:  Memory loss or impairment R41.3 (ICD-10-CM). EXAM: MRI HEAD WITHOUT CONTRAST TECHNIQUE: Multiplanar, multiecho pulse sequences of the brain and surrounding structures were obtained without intravenous contrast. COMPARISON:  None Available. FINDINGS: Brain: No acute infarction, hemorrhage, hydrocephalus, extra-axial collection or mass lesion. Scattered and confluent foci of T2 hyperintensity are seen within white matter of the cerebral hemispheres, nonspecific, most likely related to chronic vessel ischemia. Moderate parenchymal volume. Vascular: Normal flow voids. Skull and upper cervical spine: Heterogeneous marrow signal within the visualized upper cervical spine, nonspecific. Sinuses/Orbits: Negative. Other: None. IMPRESSION: 1. No acute intracranial abnormality. 2. Moderate chronic microvascular ischemic changes of the white matter. 3. Moderate parenchymal volume loss. Electronically Signed   By: Baldemar Lenis M.D.   On: 02/05/2023 14:12

## 2023-02-17 NOTE — Patient Instructions (Signed)
Stephanie Delacruz  Discharge Instructions: Thank you for choosing [facility] to provide your oncology and hematology care.  If you have a lab appointment with the [facility], please go directly to the [facility] and check in at the registration area.  Wear comfortable clothing and clothing appropriate for easy access to any Portacath or PICC line.   We strive to give you quality time with your provider. You may need to reschedule your appointment if you arrive late (15 or more minutes).  Arriving late affects you and other patients whose appointments are after yours.  Also, if you miss three or more appointments without notifying the office, you may be dismissed from the clinic at the provider's discretion.      For prescription refill requests, have your pharmacy contact our office and allow 72 hours for refills to be completed.    Today you received the following chemotherapy and/or immunotherapy agents DARZALEX      To help prevent nausea and vomiting after your treatment, we encourage you to take your nausea medication as directed.  BELOW ARE SYMPTOMS THAT SHOULD BE REPORTED IMMEDIATELY: *FEVER GREATER THAN 100.4 F (38 C) OR HIGHER *CHILLS OR SWEATING *NAUSEA AND VOMITING THAT IS NOT CONTROLLED WITH YOUR NAUSEA MEDICATION *UNUSUAL SHORTNESS OF BREATH *UNUSUAL BRUISING OR BLEEDING *URINARY PROBLEMS (pain or burning when urinating, or frequent urination) *BOWEL PROBLEMS (unusual diarrhea, constipation, pain near the anus) TENDERNESS IN MOUTH AND THROAT WITH OR WITHOUT PRESENCE OF ULCERS (sore throat, sores in mouth, or a toothache) UNUSUAL RASH, SWELLING OR PAIN  UNUSUAL VAGINAL DISCHARGE OR ITCHING   Items with * indicate a potential emergency and should be followed up as soon as possible or go to the Emergency Department if any problems should occur.  Please show the CHEMOTHERAPY ALERT CARD or IMMUNOTHERAPY ALERT CARD at check-in to  the Emergency Department and triage nurse.  Should you have questions after your visit or need to cancel or reschedule your appointment, please contact [facility]  [phone] and follow the prompts.  Office hours are 8:00 a.m. to 4:30 p.m. Monday - Friday. Please note that voicemails left after 4:00 p.m. may not be returned until the following business day.  We are closed weekends and major holidays. You have access to a nurse at all times for urgent questions. Please call the main number to the clinic [phone] and follow the prompts.  For any non-urgent questions, you may also contact your provider using MyChart. We now offer e-Visits for anyone 84 and older to request care online for non-urgent symptoms. For details visit [email or web address].   Also download the MyChart app! Go to the app store, search "MyChart", open the app, select [facility], and log in with your MyChart username and password.   Daratumumab Injection What is this medication? DARATUMUMAB (dar a toom ue mab) treats multiple myeloma, a type of bone marrow cancer. It works by helping your immune system slow or stop the spread of cancer cells. It is a monoclonal antibody. This medicine may be used for other purposes; ask your health care provider or pharmacist if you have questions. COMMON BRAND NAME(S): DARZALEX What should I tell my care team before I take this medication? They need to know if you have any of these conditions: Hereditary fructose intolerance Infection, such as chickenpox, herpes, hepatitis B Lung or breathing disease, such as asthma, COPD An unusual or allergic reaction to daratumumab, sorbitol,  other medications, foods, dyes, or preservatives Pregnant or trying to get pregnant Breastfeeding How should I use this medication? This medication is injected into a vein. It is given by your care team in a hospital or clinic setting. Talk to your care team about the  use of this medication in children. Special care may be needed. Overdosage: If you think you have taken too much of this medicine contact a poison control center or emergency room at once. NOTE: This medicine is only for you. Do not share this medicine with others. What if I miss a dose? Keep appointments for follow-up doses. It is important not to miss your dose. Call your care team if you are unable to keep an appointment. What may interact with this medication? Interactions have not been studied. This list may not describe all possible interactions. Give your health care provider a list of all the medicines, herbs, non-prescription drugs, or dietary supplements you use. Also tell them if you smoke, drink alcohol, or use illegal drugs. Some items may interact with your medicine. What should I watch for while using this medication? Your condition will be monitored carefully while you are receiving this medication. This medication can cause serious allergic reactions. To reduce your risk, your care team may give you other medication to take before receiving this one. Be sure to follow the directions from your care team. This medication can affect the results of blood tests to match your blood type. These changes can last for up to 6 months after the final dose. Your care team will do blood tests to match your blood type before you start treatment. Tell all of your care team that you are being treated with this medication before receiving a blood transfusion. This medication can affect the results of some tests used to determine treatment response; extra tests may be needed to evaluate response. Talk to your care team if you wish to become pregnant or think you are pregnant. This medication can cause serious birth defects if taken during pregnancy and for 3 months after the last dose. A reliable form of contraception is recommended while taking this medication and for 3 months after the last dose. Talk  to your care team about effective forms of contraception. Do not breast-feed while taking this medication. What side effects may I notice from receiving this medication? Side effects that you should report to your care team as soon as possible: Allergic reactions--skin rash, itching, hives, swelling of the face, lips, tongue, or throat Infection--fever, chills, cough, sore throat, wounds that don't heal, pain or trouble when passing urine, general feeling of discomfort or being unwell Infusion reactions--chest pain, shortness of breath or trouble breathing, feeling faint or lightheaded Unusual bruising or bleeding Side effects that usually do not require medical attention (report to your care team if they continue or are bothersome): Constipation Diarrhea Fatigue Nausea Pain, tingling, or numbness in the hands or feet Swelling of the ankles, hands, or feet This list may not describe all possible side effects. Call your doctor for medical advice about side effects. You may report side effects to FDA at 1-800-FDA-1088. Where should I keep my medication? This medication is given in a hospital or clinic. It will not be stored at home. NOTE: This sheet is a summary. It may not cover all possible information. If you have questions about this medicine, talk to your doctor, pharmacist, or health care provider.  2024 Elsevier/Gold Standard (2022-05-22 00:00:00)

## 2023-02-17 NOTE — Assessment & Plan Note (Signed)
Continue Q3 months Zometa- today  next due Oct   2024 Recommend calcium 1200 mg daily.

## 2023-02-17 NOTE — Assessment & Plan Note (Signed)
recommend patient to avoid nephrotoxin.  Encourage oral hydration. 

## 2023-02-18 LAB — KAPPA/LAMBDA LIGHT CHAINS
Kappa free light chain: 8.4 mg/L (ref 3.3–19.4)
Kappa, lambda light chain ratio: 2.47 — ABNORMAL HIGH (ref 0.26–1.65)
Lambda free light chains: 3.4 mg/L — ABNORMAL LOW (ref 5.7–26.3)

## 2023-02-19 LAB — MULTIPLE MYELOMA PANEL, SERUM
Albumin SerPl Elph-Mcnc: 3.6 g/dL (ref 2.9–4.4)
Albumin/Glob SerPl: 1.5 (ref 0.7–1.7)
Alpha 1: 0.2 g/dL (ref 0.0–0.4)
Alpha2 Glob SerPl Elph-Mcnc: 0.7 g/dL (ref 0.4–1.0)
B-Globulin SerPl Elph-Mcnc: 1.2 g/dL (ref 0.7–1.3)
Gamma Glob SerPl Elph-Mcnc: 0.4 g/dL (ref 0.4–1.8)
Globulin, Total: 2.5 g/dL (ref 2.2–3.9)
IgA: 211 mg/dL (ref 64–422)
IgG (Immunoglobin G), Serum: 516 mg/dL — ABNORMAL LOW (ref 586–1602)
IgM (Immunoglobulin M), Srm: 18 mg/dL — ABNORMAL LOW (ref 26–217)

## 2023-02-25 ENCOUNTER — Encounter (INDEPENDENT_AMBULATORY_CARE_PROVIDER_SITE_OTHER): Payer: Self-pay

## 2023-03-12 DIAGNOSIS — K080 Exfoliation of teeth due to systemic causes: Secondary | ICD-10-CM | POA: Diagnosis not present

## 2023-03-14 ENCOUNTER — Other Ambulatory Visit: Payer: Self-pay | Admitting: Internal Medicine

## 2023-03-17 ENCOUNTER — Inpatient Hospital Stay: Payer: Medicare Other

## 2023-03-17 ENCOUNTER — Inpatient Hospital Stay (HOSPITAL_BASED_OUTPATIENT_CLINIC_OR_DEPARTMENT_OTHER): Payer: Medicare Other | Admitting: Oncology

## 2023-03-17 ENCOUNTER — Encounter: Payer: Self-pay | Admitting: Oncology

## 2023-03-17 ENCOUNTER — Inpatient Hospital Stay: Payer: Medicare Other | Attending: Oncology

## 2023-03-17 VITALS — BP 157/64 | HR 55 | Temp 97.7°F | Resp 19

## 2023-03-17 VITALS — BP 146/64 | HR 55 | Temp 97.7°F | Resp 18 | Wt 145.1 lb

## 2023-03-17 DIAGNOSIS — Z803 Family history of malignant neoplasm of breast: Secondary | ICD-10-CM | POA: Diagnosis not present

## 2023-03-17 DIAGNOSIS — C9000 Multiple myeloma not having achieved remission: Secondary | ICD-10-CM

## 2023-03-17 DIAGNOSIS — Z79620 Long term (current) use of immunosuppressive biologic: Secondary | ICD-10-CM | POA: Insufficient documentation

## 2023-03-17 DIAGNOSIS — Z5112 Encounter for antineoplastic immunotherapy: Secondary | ICD-10-CM | POA: Diagnosis not present

## 2023-03-17 DIAGNOSIS — I10 Essential (primary) hypertension: Secondary | ICD-10-CM | POA: Diagnosis not present

## 2023-03-17 DIAGNOSIS — D649 Anemia, unspecified: Secondary | ICD-10-CM | POA: Diagnosis not present

## 2023-03-17 DIAGNOSIS — M899 Disorder of bone, unspecified: Secondary | ICD-10-CM

## 2023-03-17 DIAGNOSIS — Z5111 Encounter for antineoplastic chemotherapy: Secondary | ICD-10-CM

## 2023-03-17 DIAGNOSIS — N1832 Chronic kidney disease, stage 3b: Secondary | ICD-10-CM | POA: Diagnosis not present

## 2023-03-17 DIAGNOSIS — R5383 Other fatigue: Secondary | ICD-10-CM | POA: Insufficient documentation

## 2023-03-17 DIAGNOSIS — M549 Dorsalgia, unspecified: Secondary | ICD-10-CM | POA: Diagnosis not present

## 2023-03-17 DIAGNOSIS — Z79899 Other long term (current) drug therapy: Secondary | ICD-10-CM | POA: Diagnosis not present

## 2023-03-17 DIAGNOSIS — E7800 Pure hypercholesterolemia, unspecified: Secondary | ICD-10-CM | POA: Diagnosis not present

## 2023-03-17 DIAGNOSIS — Z8261 Family history of arthritis: Secondary | ICD-10-CM | POA: Insufficient documentation

## 2023-03-17 DIAGNOSIS — Z9089 Acquired absence of other organs: Secondary | ICD-10-CM | POA: Insufficient documentation

## 2023-03-17 DIAGNOSIS — C9001 Multiple myeloma in remission: Secondary | ICD-10-CM

## 2023-03-17 DIAGNOSIS — I6782 Cerebral ischemia: Secondary | ICD-10-CM | POA: Diagnosis not present

## 2023-03-17 DIAGNOSIS — Z823 Family history of stroke: Secondary | ICD-10-CM | POA: Insufficient documentation

## 2023-03-17 DIAGNOSIS — Z8249 Family history of ischemic heart disease and other diseases of the circulatory system: Secondary | ICD-10-CM | POA: Diagnosis not present

## 2023-03-17 DIAGNOSIS — N1831 Chronic kidney disease, stage 3a: Secondary | ICD-10-CM

## 2023-03-17 LAB — CBC WITH DIFFERENTIAL/PLATELET
Abs Immature Granulocytes: 0.03 10*3/uL (ref 0.00–0.07)
Basophils Absolute: 0 10*3/uL (ref 0.0–0.1)
Basophils Relative: 1 %
Eosinophils Absolute: 0.1 10*3/uL (ref 0.0–0.5)
Eosinophils Relative: 3 %
HCT: 31.8 % — ABNORMAL LOW (ref 36.0–46.0)
Hemoglobin: 10.3 g/dL — ABNORMAL LOW (ref 12.0–15.0)
Immature Granulocytes: 1 %
Lymphocytes Relative: 33 %
Lymphs Abs: 1.7 10*3/uL (ref 0.7–4.0)
MCH: 32.4 pg (ref 26.0–34.0)
MCHC: 32.4 g/dL (ref 30.0–36.0)
MCV: 100 fL (ref 80.0–100.0)
Monocytes Absolute: 0.3 10*3/uL (ref 0.1–1.0)
Monocytes Relative: 6 %
Neutro Abs: 2.9 10*3/uL (ref 1.7–7.7)
Neutrophils Relative %: 56 %
Platelets: 206 10*3/uL (ref 150–400)
RBC: 3.18 MIL/uL — ABNORMAL LOW (ref 3.87–5.11)
RDW: 12.2 % (ref 11.5–15.5)
WBC: 5.1 10*3/uL (ref 4.0–10.5)
nRBC: 0 % (ref 0.0–0.2)

## 2023-03-17 LAB — COMPREHENSIVE METABOLIC PANEL
ALT: 14 U/L (ref 0–44)
AST: 22 U/L (ref 15–41)
Albumin: 3.8 g/dL (ref 3.5–5.0)
Alkaline Phosphatase: 38 U/L (ref 38–126)
Anion gap: 5 (ref 5–15)
BUN: 18 mg/dL (ref 8–23)
CO2: 25 mmol/L (ref 22–32)
Calcium: 9.3 mg/dL (ref 8.9–10.3)
Chloride: 108 mmol/L (ref 98–111)
Creatinine, Ser: 1.09 mg/dL — ABNORMAL HIGH (ref 0.44–1.00)
GFR, Estimated: 49 mL/min — ABNORMAL LOW (ref >60)
Glucose, Bld: 87 mg/dL (ref 70–99)
Potassium: 3.7 mmol/L (ref 3.5–5.1)
Sodium: 138 mmol/L (ref 135–145)
Total Bilirubin: 0.4 mg/dL (ref 0.3–1.2)
Total Protein: 6.7 g/dL (ref 6.5–8.1)

## 2023-03-17 MED ORDER — ACETAMINOPHEN 325 MG PO TABS
650.0000 mg | ORAL_TABLET | Freq: Once | ORAL | Status: AC
  Administered 2023-03-17: 650 mg via ORAL
  Filled 2023-03-17: qty 2

## 2023-03-17 MED ORDER — DARATUMUMAB-HYALURONIDASE-FIHJ 1800-30000 MG-UT/15ML ~~LOC~~ SOLN
1800.0000 mg | Freq: Once | SUBCUTANEOUS | Status: AC
  Administered 2023-03-17: 1800 mg via SUBCUTANEOUS
  Filled 2023-03-17: qty 15

## 2023-03-17 MED ORDER — DIPHENHYDRAMINE HCL 25 MG PO CAPS
50.0000 mg | ORAL_CAPSULE | Freq: Once | ORAL | Status: AC
  Administered 2023-03-17: 50 mg via ORAL
  Filled 2023-03-17: qty 2

## 2023-03-17 NOTE — Assessment & Plan Note (Signed)
Continue Q3 months Zometa- today  next due Oct   2024 Recommend calcium 1200 mg daily. Repeat PET scan whole body

## 2023-03-17 NOTE — Assessment & Plan Note (Signed)
recommend patient to avoid nephrotoxin.  Encourage oral hydration. 

## 2023-03-17 NOTE — Assessment & Plan Note (Addendum)
Multiple myeloma, IgA kappa, complex hyperploid gain of 1q, S/p Daratumumab Rd.   Revlimid D1-21,  07/23/22 bone marrow biopsy  3% plasma cell, no M protein. She was in CR  Labs are reviewed and discussed with patient.  Lab Results  Component Value Date   MPROTEIN 0.4 (H) 02/17/2023   MPROTEIN 0.2 (H) 01/20/2023   MPROTEIN 0.5 (H) 12/23/2022   KAPLAMBRATIO 2.47 (H) 02/17/2023   KAPLAMBRATIO 1.39 01/20/2023   KAPLAMBRATIO 2.56 (H) 12/23/2022    M protein has slightly increased from nadir. increased light chain ratio.  We discussed that it is possible that myeloma is progressing.  Given her age, shared decision was made to continue monitoring, may consider repeat bone marrow biopsy in the future if M protein, light chain ratio progressively get worse. Proceed with  Daratumumab monotherapy maintenance - she takes home supply of Dexamethasone prior to her Daratumumab.  On asprin 81mg  daily and Acyclovir 400mg  BID.

## 2023-03-17 NOTE — Assessment & Plan Note (Signed)
Stable. Continue monitor

## 2023-03-17 NOTE — Progress Notes (Signed)
Hematology/Oncology Progress note [phone] [phone]     CHIEF COMPLAINTS/REASON FOR VISIT:  Follow-up for multiple myeloma  ASSESSMENT & PLAN:   Cancer Staging  Multiple myeloma (HCC) Staging form: Plasma Cell Myeloma and Plasma Cell Disorders, AJCC 8th Edition - Clinical stage from 08/22/2021: RISS Stage II (Beta-2-microglobulin (mg/L): 4.1, Albumin (g/dL): 2.8, ISS: Stage II, High-risk cytogenetics: Absent, LDH: Normal) - Signed by Rickard Patience, MD on 09/06/2021   Multiple myeloma (HCC) Multiple myeloma, IgA kappa, complex hyperploid gain of 1q, S/p Daratumumab Rd.   Revlimid D1-21,  07/23/22 bone marrow biopsy  3% plasma cell, no M protein. She was in CR  Labs are reviewed and discussed with patient.  Lab Results  Component Value Date   MPROTEIN 0.4 (H) 02/17/2023   MPROTEIN 0.2 (H) 01/20/2023   MPROTEIN 0.5 (H) 12/23/2022   KAPLAMBRATIO 2.47 (H) 02/17/2023   KAPLAMBRATIO 1.39 01/20/2023   KAPLAMBRATIO 2.56 (H) 12/23/2022    M protein has slightly increased from nadir. increased light chain ratio.  We discussed that it is possible that myeloma is progressing.  Given her age, shared decision was made to continue monitoring, may consider repeat bone marrow biopsy in the future if M protein, light chain ratio progressively get worse. Proceed with  Daratumumab monotherapy maintenance - she takes home supply of Dexamethasone prior to her Daratumumab.  On asprin 81mg  daily and Acyclovir 400mg  BID.   Bone lesion Continue Q3 months Zometa- today  next due Oct   2024 Recommend calcium 1200 mg daily. Repeat PET scan whole body   Normocytic anemia Stable Continue monitor.   Stage 3a chronic kidney disease (HCC) recommend patient to avoid nephrotoxin.  Encourage oral hydration.  Encounter for antineoplastic chemotherapy Chemotherapy plan as listed as above.  Orders Placed This Encounter  Procedures   NM PET Image Restage (PS) Whole Body    Standing  Status:   Future    Standing Expiration Date:   03/16/2024    Order Specific Question:   If indicated for the ordered procedure, I authorize the administration of a radiopharmaceutical per Radiology protocol    Answer:   Yes    Order Specific Question:   Preferred imaging location?    Answer:   [facility]   Kappa/lambda light chains    Standing Status:   Future    Standing Expiration Date:   04/13/2024   Multiple Myeloma Panel (SPEP&IFE w/QIG)    Standing Status:   Future    Standing Expiration Date:   04/13/2024   Comprehensive metabolic panel    Standing Status:   Future    Standing Expiration Date:   04/13/2024   CBC with Differential    Standing Status:   Future    Standing Expiration Date:   04/13/2024   Kappa/lambda light chains    Standing Status:   Future    Standing Expiration Date:   05/11/2024   Multiple Myeloma Panel (SPEP&IFE w/QIG)    Standing Status:   Future    Standing Expiration Date:   05/11/2024   Comprehensive metabolic panel    Standing Status:   Future    Standing Expiration Date:   05/11/2024   CBC with Differential    Standing Status:   Future    Standing Expiration Date:   05/11/2024   Kappa/lambda light chains    Standing Status:   Future    Standing Expiration Date:   06/08/2024   Multiple Myeloma Panel (SPEP&IFE w/QIG)    Standing Status:  Future    Standing Expiration Date:   06/08/2024   Comprehensive metabolic panel    Standing Status:   Future    Standing Expiration Date:   06/08/2024   CBC with Differential    Standing Status:   Future    Standing Expiration Date:   06/08/2024     Follow up  Lab/MD/Dara in 4 weeks  All questions were answered. The patient knows to call the clinic with any problems, questions or concerns.  Rickard Patience, MD, PhD [facility] Hematology Oncology 03/17/2023     HISTORY OF PRESENTING ILLNESS:   Stephanie Delacruz is a  89 y.o.  female presents for follow up of multiple myeloma.  Oncology History   Multiple myeloma (HCC)  08/22/2021 Initial Diagnosis   Multiple myeloma (HCC) IgA kappa Initial M protein 5.2, free kappa light chain ratio 89.2, Kappa/lambda ratio 13.31   08/22/2021 Cancer Staging   Staging form: Plasma Cell Myeloma and Plasma Cell Disorders, AJCC 8th Edition - Clinical stage from 08/22/2021: RISS Stage II (Beta-2-microglobulin (mg/L): 4.1, Albumin (g/dL): 2.8, ISS: Stage II, High-risk cytogenetics: Absent, LDH: Normal) - Signed by Rickard Patience, MD on 09/06/2021 Stage prefix: Initial diagnosis Beta 2 microglobulin range (mg/L): 3.5 to 5.49 Albumin range (g/dL): Less than 3.5 Cytogenetics: 1q addition   08/26/2021 - 09/01/2022 Chemotherapy   08/26/21 MYELOMA Daratumumab SQ q28d  09/06/2021 added Revlimid 10mg  x 14 days.  09/27/2021 starting cycle 2 Revlimid 10 mg 21 days on, 7 days off. Weekly Dexamethasone.    08/26/2021 Bone Marrow Biopsy   Bone marrow biopsy showed hypercellular marrow with plasma cell neoplasm. 67% plasma cells in the aspirate. Cytogenetics showed complex hyperplasia, gain of 1 q.    07/23/2022 Bone Marrow Biopsy   Bone marrow biopsy showed Hypercellular bone marrow for age with trilineage hematopoiesis and 3% plasma cells  Cytogenetics are normal.    S/p thyroid lesion biopsy, obtained by endocrinologist. + AUS INTERVAL HISTORY Stephanie Delacruz is a 89 y.o. female who has above history reviewed by me today presents for follow up visit for management of multiple myeloma She was accompanied by her daughter today.  She denies any nausea vomiting diarrhea.   No new complaints.   Review of Systems  Constitutional:  Positive for fatigue. Negative for appetite change, chills and fever.  HENT:   Negative for hearing loss and voice change.   Eyes:  Negative for eye problems.  Respiratory:  Negative for chest tightness and cough.   Cardiovascular:  Negative for chest pain.  Gastrointestinal:  Negative for abdominal distention, abdominal pain and blood in stool.   Endocrine: Negative for hot flashes.  Genitourinary:  Negative for difficulty urinating and frequency.   Musculoskeletal:  Positive for back pain. Negative for arthralgias.  Skin:  Negative for itching and rash.  Neurological:  Negative for extremity weakness.  Hematological:  Negative for adenopathy.  Psychiatric/Behavioral:  Negative for confusion.     MEDICAL HISTORY:  Past Medical History:  Diagnosis Date   Anemia    GERD (gastroesophageal reflux disease)    Hypercholesterolemia    Hypertension    Renal insufficiency     SURGICAL HISTORY: Past Surgical History:  Procedure Laterality Date   TONSILLECTOMY     TUBAL LIGATION      SOCIAL HISTORY: Social History   Socioeconomic History   Marital status: Married    Spouse name: Not on file   Number of children: Not on file   Years of education: Not on  file   Highest education level: Not on file  Occupational History   Not on file  Tobacco Use   Smoking status: Never   Smokeless tobacco: Never  Vaping Use   Vaping status: Never Used  Substance and Sexual Activity   Alcohol use: No   Drug use: No   Sexual activity: Not on file  Other Topics Concern   Not on file  Social History Narrative   Lives with husband Fayrene Fearing and daughter Marylene Land. No pets.    Social Determinants of Health   Financial Resource Strain: Not on file  Food Insecurity: Not on file  Transportation Needs: Not on file  Physical Activity: Not on file  Stress: Not on file  Social Connections: Not on file  Intimate Partner Violence: Not on file    FAMILY HISTORY: Family History  Problem Relation Age of Onset   Hypertension Mother    Arthritis Mother    Hypertension Father    Stroke Father    Breast cancer Niece     ALLERGIES:  has No Known Allergies.  MEDICATIONS:  Current Outpatient Medications  Medication Sig Dispense Refill   acyclovir (ZOVIRAX) 400 MG tablet TAKE 1 TABLET(400 MG) BY MOUTH TWICE DAILY 60 tablet 11   amLODipine  (NORVASC) 2.5 MG tablet Take 2 tablets (5 mg total) by mouth daily. 180 tablet 2   ascorbic acid (VITAMIN C) 250 MG tablet daily Take 2 tablets every day by oral route.     aspirin 81 MG EC tablet Take 81 mg by mouth daily.     Calcium Carb-Cholecalciferol (CALCIUM 600+D) 600-20 MG-MCG TABS Take 1 tablet by mouth 3 (three) times daily.     dexamethasone (DECADRON) 4 MG tablet Take 5 tablets (20 mg total) by mouth See admin instructions. Take 20mg  prior to each Daratumumab treatment. 30 tablet 1   donepezil (ARICEPT) 10 MG tablet TAKE 1 TABLET(10 MG) BY MOUTH DAILY 90 tablet 2   fenofibrate (TRICOR) 48 MG tablet TAKE 1 TABLET(48 MG) BY MOUTH DAILY 90 tablet 2   fexofenadine (ALLEGRA) 180 MG tablet Take by mouth.     hydrochlorothiazide (HYDRODIURIL) 25 MG tablet TAKE 1/2 TABLET BY MOUTH EVERY DAY 45 tablet 1   memantine (NAMENDA) 5 MG tablet Take 5 mg by mouth 2 (two) times daily.     methimazole (TAPAZOLE) 5 MG tablet Take 1 tablet (5 mg total) by mouth daily. 90 tablet 3   metoprolol succinate (TOPROL-XL) 25 MG 24 hr tablet TAKE 2 TABLETS(50 MG) BY MOUTH DAILY 180 tablet 1   montelukast (SINGULAIR) 10 MG tablet Take 1 tablet (10 mg total) by mouth See admin instructions. Take 1 tablet one day before Daratumumab injection. 60 tablet 0   Multiple Vitamins-Minerals (WOMENS MULTIVITAMIN PO) Take 1 tablet by mouth daily.     omeprazole (PRILOSEC) 20 MG capsule TAKE 1 CAPSULE(20 MG) BY MOUTH DAILY 90 capsule 3   prochlorperazine (COMPAZINE) 10 MG tablet Take 10 mg by mouth every 6 (six) hours as needed for nausea or vomiting.     valsartan (DIOVAN) 160 MG tablet Take 1 tablet (160 mg total) by mouth daily. 90 tablet 1   No current facility-administered medications for this visit.   Facility-Administered Medications Ordered in Other Visits  Medication Dose Route Frequency Provider Last Rate Last Admin   daratumumab-hyaluronidase-fihj (DARZALEX FASPRO) 1800-30000 MG-UT/15ML chemo SQ injection 1,800 mg   1,800 mg Subcutaneous Once Rickard Patience, MD         PHYSICAL EXAMINATION: ECOG PERFORMANCE  STATUS: 1 - Symptomatic but completely ambulatory Vitals:   03/17/23 0939  BP: (!) 146/64  Pulse: (!) 55  Resp: 18  Temp: 97.7 F (36.5 C)  SpO2: 100%   Filed Weights   03/17/23 0939  Weight: 145 lb 1.6 oz (65.8 kg)    Physical Exam Constitutional:      General: She is not in acute distress. HENT:     Head: Normocephalic and atraumatic.  Eyes:     General: No scleral icterus. Cardiovascular:     Rate and Rhythm: Normal rate and regular rhythm.     Heart sounds: Normal heart sounds.  Pulmonary:     Effort: Pulmonary effort is normal. No respiratory distress.     Breath sounds: No wheezing.  Abdominal:     General: Bowel sounds are normal. There is no distension.     Palpations: Abdomen is soft.  Musculoskeletal:        General: No deformity. Normal range of motion.     Cervical back: Normal range of motion and neck supple.  Skin:    General: Skin is warm and dry.     Findings: No erythema or rash.  Neurological:     Mental Status: She is alert and oriented to person, place, and time. Mental status is at baseline.     Cranial Nerves: No cranial nerve deficit.     Coordination: Coordination normal.  Psychiatric:        Mood and Affect: Mood normal.     LABORATORY DATA:  I have reviewed the data as listed    Latest Ref Rng & Units 03/17/2023    9:26 AM 02/17/2023   10:27 AM 01/20/2023    9:26 AM  CBC  WBC 4.0 - 10.5 K/uL 5.1  4.1  4.7   Hemoglobin 12.0 - 15.0 g/dL 75.6  43.3  29.5   Hematocrit 36.0 - 46.0 % 31.8  33.6  31.2   Platelets 150 - 400 K/uL 206  211  208       Latest Ref Rng & Units 03/17/2023    9:26 AM 02/17/2023   10:27 AM 01/20/2023    9:26 AM  CMP  Glucose 70 - 99 mg/dL 87  89  91   BUN 8 - 23 mg/dL 18  16  17    Creatinine 0.44 - 1.00 mg/dL 1.88  4.16  6.06   Sodium 135 - 145 mmol/L 138  141  141   Potassium 3.5 - 5.1 mmol/L 3.7  3.8  3.6   Chloride  98 - 111 mmol/L 108  107  108   CO2 22 - 32 mmol/L 25  26  24    Calcium 8.9 - 10.3 mg/dL 9.3  9.4  8.9   Total Protein 6.5 - 8.1 g/dL 6.7  7.0  6.4   Total Bilirubin 0.3 - 1.2 mg/dL 0.4  0.3  0.6   Alkaline Phos 38 - 126 U/L 38  41  41   AST 15 - 41 U/L 22  23  26    ALT 0 - 44 U/L 14  15  15       Iron/TIBC/Ferritin/ %Sat    Component Value Date/Time   IRON 54 07/24/2021 1212   TIBC 277 07/24/2021 1212   FERRITIN 93 07/24/2021 1212   IRONPCTSAT 20 07/24/2021 1212       RADIOGRAPHIC STUDIES: I have personally reviewed the radiological images as listed and agreed with the findings in the report. MR BRAIN WO CONTRAST  Result  Date: 02/05/2023 CLINICAL DATA:  Memory loss or impairment R41.3 (ICD-10-CM). EXAM: MRI HEAD WITHOUT CONTRAST TECHNIQUE: Multiplanar, multiecho pulse sequences of the brain and surrounding structures were obtained without intravenous contrast. COMPARISON:  None Available. FINDINGS: Brain: No acute infarction, hemorrhage, hydrocephalus, extra-axial collection or mass lesion. Scattered and confluent foci of T2 hyperintensity are seen within white matter of the cerebral hemispheres, nonspecific, most likely related to chronic vessel ischemia. Moderate parenchymal volume. Vascular: Normal flow voids. Skull and upper cervical spine: Heterogeneous marrow signal within the visualized upper cervical spine, nonspecific. Sinuses/Orbits: Negative. Other: None. IMPRESSION: 1. No acute intracranial abnormality. 2. Moderate chronic microvascular ischemic changes of the white matter. 3. Moderate parenchymal volume loss. Electronically Signed   By: Baldemar Lenis M.D.   On: 02/05/2023 14:12

## 2023-03-17 NOTE — Patient Instructions (Signed)
[facility] [place] [facility]  Discharge Instructions: Thank you for choosing [facility] to provide your oncology and hematology care.  If you have a lab appointment with the Cancer Center, please go directly to the Cancer Center and check in at the registration area.  Wear comfortable clothing and clothing appropriate for easy access to any Portacath or PICC line.   We strive to give you quality time with your provider. You may need to reschedule your appointment if you arrive late (15 or more minutes).  Arriving late affects you and other patients whose appointments are after yours.  Also, if you miss three or more appointments without notifying the office, you may be dismissed from the clinic at the provider's discretion.      For prescription refill requests, have your pharmacy contact our office and allow 72 hours for refills to be completed.    Today you received the following chemotherapy and/or immunotherapy agents darzalex    To help prevent nausea and vomiting after your treatment, we encourage you to take your nausea medication as directed.  BELOW ARE SYMPTOMS THAT SHOULD BE REPORTED IMMEDIATELY: *FEVER GREATER THAN 100.4 F (38 C) OR HIGHER *CHILLS OR SWEATING *NAUSEA AND VOMITING THAT IS NOT CONTROLLED WITH YOUR NAUSEA MEDICATION *UNUSUAL SHORTNESS OF BREATH *UNUSUAL BRUISING OR BLEEDING *URINARY PROBLEMS (pain or burning when urinating, or frequent urination) *BOWEL PROBLEMS (unusual diarrhea, constipation, pain near the anus) TENDERNESS IN MOUTH AND THROAT WITH OR WITHOUT PRESENCE OF ULCERS (sore throat, sores in mouth, or a toothache) UNUSUAL RASH, SWELLING OR PAIN  UNUSUAL VAGINAL DISCHARGE OR ITCHING   Items with * indicate a potential emergency and should be followed up as soon as possible or go to the Emergency Department if any problems should occur.  Please show the CHEMOTHERAPY ALERT CARD or IMMUNOTHERAPY ALERT CARD at check-in to  the Emergency Department and triage nurse.  Should you have questions after your visit or need to cancel or reschedule your appointment, please contact [facility] [place] [facility]  [phone] and follow the prompts.  Office hours are 8:00 a.m. to 4:30 p.m. Monday - Friday. Please note that voicemails left after 4:00 p.m. may not be returned until the following business day.  We are closed weekends and major holidays. You have access to a nurse at all times for urgent questions. Please call the main number to the clinic [phone] and follow the prompts.  For any non-urgent questions, you may also contact your provider using MyChart. We now offer e-Visits for anyone 18 and older to request care online for non-urgent symptoms. For details visit [email or web address].   Also download the MyChart app! Go to the app store, search "MyChart", open the app, select [facility], and log in with your MyChart username and password.    

## 2023-03-17 NOTE — Assessment & Plan Note (Signed)
Chemotherapy plan as listed as above. 

## 2023-03-18 LAB — KAPPA/LAMBDA LIGHT CHAINS
Kappa free light chain: 8.2 mg/L (ref 3.3–19.4)
Kappa, lambda light chain ratio: 2.16 — ABNORMAL HIGH (ref 0.26–1.65)
Lambda free light chains: 3.8 mg/L — ABNORMAL LOW (ref 5.7–26.3)

## 2023-03-19 DIAGNOSIS — K080 Exfoliation of teeth due to systemic causes: Secondary | ICD-10-CM | POA: Diagnosis not present

## 2023-03-22 LAB — MULTIPLE MYELOMA PANEL, SERUM
Albumin SerPl Elph-Mcnc: 3.5 g/dL (ref 2.9–4.4)
Albumin/Glob SerPl: 1.5 (ref 0.7–1.7)
Alpha 1: 0.2 g/dL (ref 0.0–0.4)
Alpha2 Glob SerPl Elph-Mcnc: 0.7 g/dL (ref 0.4–1.0)
B-Globulin SerPl Elph-Mcnc: 1.1 g/dL (ref 0.7–1.3)
Gamma Glob SerPl Elph-Mcnc: 0.3 g/dL — ABNORMAL LOW (ref 0.4–1.8)
Globulin, Total: 2.4 g/dL (ref 2.2–3.9)
IgA: 209 mg/dL (ref 64–422)
IgG (Immunoglobin G), Serum: 515 mg/dL — ABNORMAL LOW (ref 586–1602)
IgM (Immunoglobulin M), Srm: 15 mg/dL — ABNORMAL LOW (ref 26–217)
M Protein SerPl Elph-Mcnc: 0.1 g/dL — ABNORMAL HIGH
Total Protein ELP: 5.9 g/dL — ABNORMAL LOW (ref 6.0–8.5)

## 2023-03-25 ENCOUNTER — Encounter: Payer: Self-pay | Admitting: Internal Medicine

## 2023-03-25 ENCOUNTER — Ambulatory Visit (INDEPENDENT_AMBULATORY_CARE_PROVIDER_SITE_OTHER): Payer: Medicare Other | Admitting: Internal Medicine

## 2023-03-25 VITALS — BP 122/62 | HR 58 | Temp 97.2°F | Ht 61.0 in | Wt 141.2 lb

## 2023-03-25 DIAGNOSIS — Z23 Encounter for immunization: Secondary | ICD-10-CM

## 2023-03-25 DIAGNOSIS — I10 Essential (primary) hypertension: Secondary | ICD-10-CM

## 2023-03-25 DIAGNOSIS — R55 Syncope and collapse: Secondary | ICD-10-CM | POA: Insufficient documentation

## 2023-03-25 DIAGNOSIS — F0390 Unspecified dementia without behavioral disturbance: Secondary | ICD-10-CM | POA: Diagnosis not present

## 2023-03-25 DIAGNOSIS — C9001 Multiple myeloma in remission: Secondary | ICD-10-CM

## 2023-03-25 DIAGNOSIS — R001 Bradycardia, unspecified: Secondary | ICD-10-CM | POA: Diagnosis not present

## 2023-03-25 DIAGNOSIS — E7800 Pure hypercholesterolemia, unspecified: Secondary | ICD-10-CM

## 2023-03-25 DIAGNOSIS — N1831 Chronic kidney disease, stage 3a: Secondary | ICD-10-CM

## 2023-03-25 DIAGNOSIS — E042 Nontoxic multinodular goiter: Secondary | ICD-10-CM

## 2023-03-25 NOTE — Progress Notes (Signed)
Subjective:    Patient ID: Stephanie Delacruz, female    DOB: 1933-09-30, 89 y.o.   MRN: [id number]  Patient here for a scheduled follow up.   HPI Here to follow up regarding her blood pressure. She is accompanied by her daughter. History obtained from both of them. Being followed by hematology for treatment - multiple myeloma. Appears to be doing relatively well. Last office visit 03/17/23. M protein slightly increased from nadir. Recommended continuing with daratumumab monotherapy maintenance. Continue aspirin daily and acyclovir. Also recommended f/u PET scan.  Thyroid ultrasound - reveals changes that appear to be consistent with multinodular goiter. Saw endocrinology - recommended FNA. Molecular testing of the thyroid - no evidence of cancer. Recommended starting methimazole.  Had f/u 12/29/22 - recommended f/u thyroid ultrasound. Lower extremity swelling - better with compression hose. Had f/u with Dr Sherryll Burger - 01/13/23 - f/u [facility] - recommended continue donepezil and memantine. Also had reported near syncopal episode 11/2022.  Recommended MRI brain. MRI 02/05/23 - no acute intracranial abnormality.  Moderate chronic microvascular ischemic changes of the white matter.  Moderate parenchymal volume loss. Dicussed MRI results.  Also discussed the previous near syncopal episode.  She had walked into the kitchen.  Had eaten.  Was standing at the counter.  Daughter heard her fall and turned around.  Pt on floor.  No head injury.  Denies LOC.  No confusion or disorientation.  No dizziness or light headedness.  After the episode, the pt did have to go immediately to the bathroom - for bowel movement, but did not feel sick, etc. Daughter reports one other episode - a few years ago.  Occurrent when walking. No further episodes.  Denies chest pain or increased sob.  No nausea or vomiting.  No abdominal pain reported.    Past Medical History:  Diagnosis Date   Anemia    GERD (gastroesophageal reflux disease)     Hypercholesterolemia    Hypertension    Renal insufficiency    Past Surgical History:  Procedure Laterality Date   TONSILLECTOMY     TUBAL LIGATION     Family History  Problem Relation Age of Onset   Hypertension Mother    Arthritis Mother    Hypertension Father    Stroke Father    Breast cancer Niece    Social History   Socioeconomic History   Marital status: Married    Spouse name: Not on file   Number of children: Not on file   Years of education: Not on file   Highest education level: Not on file  Occupational History   Not on file  Tobacco Use   Smoking status: Never   Smokeless tobacco: Never  Vaping Use   Vaping status: Never Used  Substance and Sexual Activity   Alcohol use: No   Drug use: No   Sexual activity: Not on file  Other Topics Concern   Not on file  Social History Narrative   Lives with husband Fayrene Fearing and daughter Marylene Land. No pets.    Social Determinants of Health   Financial Resource Strain: Not on file  Food Insecurity: Not on file  Transportation Needs: Not on file  Physical Activity: Not on file  Stress: Not on file  Social Connections: Not on file     Review of Systems  Constitutional:  Negative for appetite change and unexpected weight change.  HENT:  Negative for congestion and sinus pressure.   Respiratory:  Negative for cough, chest tightness and shortness  of breath.   Cardiovascular:  Negative for chest pain, palpitations and leg swelling.  Gastrointestinal:  Negative for abdominal pain, diarrhea, nausea and vomiting.  Genitourinary:  Negative for difficulty urinating and dysuria.  Musculoskeletal:  Negative for joint swelling and myalgias.  Skin:  Negative for color change and rash.  Neurological:  Negative for dizziness and headaches.  Psychiatric/Behavioral:  Negative for agitation and dysphoric mood.        Objective:     BP 122/62   Pulse (!) 58   Temp (!) 97.2 F (36.2 C) (Oral)   Ht 5\' 1"  (1.549 m)   Wt 141 lb  3.2 oz (64 kg)   SpO2 99%   BMI 26.68 kg/m  Wt Readings from Last 3 Encounters:  03/25/23 141 lb 3.2 oz (64 kg)  03/17/23 145 lb 1.6 oz (65.8 kg)  02/17/23 147 lb 1.6 oz (66.7 kg)    Physical Exam Vitals reviewed.  Constitutional:      General: She is not in acute distress.    Appearance: Normal appearance.  HENT:     Head: Normocephalic and atraumatic.     Right Ear: External ear normal.     Left Ear: External ear normal.  Eyes:     General: No scleral icterus.       Right eye: No discharge.        Left eye: No discharge.     Conjunctiva/sclera: Conjunctivae normal.  Neck:     Thyroid: No thyromegaly.  Cardiovascular:     Rate and Rhythm: Regular rhythm.     Comments: Ventricular rate 56 Pulmonary:     Effort: No respiratory distress.     Breath sounds: Normal breath sounds. No wheezing.  Abdominal:     General: Bowel sounds are normal.     Palpations: Abdomen is soft.     Tenderness: There is no abdominal tenderness.  Musculoskeletal:        General: No tenderness.     Cervical back: Neck supple. No tenderness.     Comments: No increased swelling.   Lymphadenopathy:     Cervical: No cervical adenopathy.  Skin:    Findings: No erythema or rash.  Neurological:     Mental Status: She is alert and oriented to person, place, and time.  Psychiatric:        Mood and Affect: Mood normal.        Behavior: Behavior normal.      Outpatient Encounter Medications as of 03/25/2023  Medication Sig   acyclovir (ZOVIRAX) 400 MG tablet TAKE 1 TABLET(400 MG) BY MOUTH TWICE DAILY   amLODipine (NORVASC) 2.5 MG tablet Take 2 tablets (5 mg total) by mouth daily.   ascorbic acid (VITAMIN C) 250 MG tablet daily Take 2 tablets every day by oral route.   aspirin 81 MG EC tablet Take 81 mg by mouth daily.   Calcium Carb-Cholecalciferol (CALCIUM 600+D) 600-20 MG-MCG TABS Take 1 tablet by mouth 3 (three) times daily.   dexamethasone (DECADRON) 4 MG tablet Take 5 tablets (20 mg total)  by mouth See admin instructions. Take 20mg  prior to each Daratumumab treatment.   donepezil (ARICEPT) 10 MG tablet TAKE 1 TABLET(10 MG) BY MOUTH DAILY   fenofibrate (TRICOR) 48 MG tablet TAKE 1 TABLET(48 MG) BY MOUTH DAILY   fexofenadine (ALLEGRA) 180 MG tablet Take by mouth.   hydrochlorothiazide (HYDRODIURIL) 25 MG tablet TAKE 1/2 TABLET BY MOUTH EVERY DAY   memantine (NAMENDA) 5 MG tablet Take 5 mg by  mouth 2 (two) times daily.   methimazole (TAPAZOLE) 5 MG tablet Take 1 tablet (5 mg total) by mouth daily.   metoprolol succinate (TOPROL-XL) 25 MG 24 hr tablet TAKE 2 TABLETS(50 MG) BY MOUTH DAILY   montelukast (SINGULAIR) 10 MG tablet Take 1 tablet (10 mg total) by mouth See admin instructions. Take 1 tablet one day before Daratumumab injection.   Multiple Vitamins-Minerals (WOMENS MULTIVITAMIN PO) Take 1 tablet by mouth daily.   omeprazole (PRILOSEC) 20 MG capsule TAKE 1 CAPSULE(20 MG) BY MOUTH DAILY   prochlorperazine (COMPAZINE) 10 MG tablet Take 10 mg by mouth every 6 (six) hours as needed for nausea or vomiting.   valsartan (DIOVAN) 160 MG tablet Take 1 tablet (160 mg total) by mouth daily.   No facility-administered encounter medications on file as of 03/25/2023.     Lab Results  Component Value Date   WBC 5.1 03/17/2023   HGB 10.3 (L) 03/17/2023   HCT 31.8 (L) 03/17/2023   PLT 206 03/17/2023   GLUCOSE 87 03/17/2023   CHOL 115 06/27/2021   TRIG 34 06/27/2021   HDL 51 06/27/2021   LDLCALC 57 06/27/2021   ALT 14 03/17/2023   AST 22 03/17/2023   NA 138 03/17/2023   K 3.7 03/17/2023   CL 108 03/17/2023   CREATININE 1.09 (H) 03/17/2023   BUN 18 03/17/2023   CO2 25 03/17/2023   TSH 1.26 11/17/2022    MR BRAIN WO CONTRAST  Result Date: 02/05/2023 CLINICAL DATA:  Memory loss or impairment R41.3 (ICD-10-CM). EXAM: MRI HEAD WITHOUT CONTRAST TECHNIQUE: Multiplanar, multiecho pulse sequences of the brain and surrounding structures were obtained without intravenous contrast.  COMPARISON:  None Available. FINDINGS: Brain: No acute infarction, hemorrhage, hydrocephalus, extra-axial collection or mass lesion. Scattered and confluent foci of T2 hyperintensity are seen within white matter of the cerebral hemispheres, nonspecific, most likely related to chronic vessel ischemia. Moderate parenchymal volume. Vascular: Normal flow voids. Skull and upper cervical spine: Heterogeneous marrow signal within the visualized upper cervical spine, nonspecific. Sinuses/Orbits: Negative. Other: None. IMPRESSION: 1. No acute intracranial abnormality. 2. Moderate chronic microvascular ischemic changes of the white matter. 3. Moderate parenchymal volume loss. Electronically Signed   By: Baldemar Lenis M.D.   On: 02/05/2023 14:12       Assessment & Plan:  Near syncope Assessment & Plan: Describes recent near syncopal episode.  No LOC reported.  No confusion.  Had one other episode a few years ago while walking.  Unclear etiology.  Did have to go to the bathroom for bowel movement after this last episode.  Discussed possible vasovagal episode.  Denies any dizziness, light headedness, nausea, abdominal pain or chest pain.  EKG - SR with ventricular rate 56.  Blood pressure ok.  Discussed further cardiac w/up, including zio monitor, echo, etc.  Preferred cardiology referral to determine if any further cardiac w/up warranted.    Orders: -     EKG 12-Lead -     Ambulatory referral to Cardiology  Bradycardia -     Ambulatory referral to Cardiology  Encounter for immunization -     Flu Vaccine Trivalent High Dose (Fluad)  Dementia without behavioral disturbance, psychotic disturbance, mood disturbance, or anxiety, unspecified dementia severity, unspecified dementia type [facility]) Assessment & Plan: On aricept and namenda.  Continue f/u with neurology. Recent evaluation as outlined.  MRI brain as outlined.  Continue f/u with neurology.  Stable.    Essential (primary)  hypertension Assessment & Plan: Blood pressure as  outlined.  Continue amlodipine, metoprolol, hydrochlorothiazide and diovan.  Follow pressures.  Follow metabolic panel.    Multinodular goiter Assessment & Plan: Thyroid ultrasound - reveals changes that appear to be consistent with multinodular goiter. Saw endocrinology - recommended FNA. Molecular testing of the thyroid - no evidence of cancer. Recommended starting methimazole.  Had f/u 12/29/22 - recommended f/u thyroid ultrasound.    Multiple myeloma in remission [facility]) Assessment & Plan: Being followed by hematology for treatment - multiple myeloma. Treated with Daratumumab and revilimid. S/p bone marro biopsy - 3% plasma cell, no M protein.  In CR. Continues aspirin daily. . Last office visit 03/17/23. M protein slightly increased from nadir. Recommended continuing with daratumumab monotherapy maintenance. Continue aspirin daily and acyclovir. Also recommended f/u PET scan.    Pure hypercholesterolemia Assessment & Plan: On tricor.  Follow lipid panel.  Will need to see if can add to labs through cancer center.    Stage 3a chronic kidney disease (HCC) Assessment & Plan: Avoid antiinflammatories.  Stay hydrated.  Follow metabolic panel.      I spent 45 minutes with the patient.   Time spent discussing her current concerns and symptoms.  Specifically time spent discussed the near syncopal episode, recent neurology evaluation and scan. Time also spent discussing further w/up, evaluation and treatment.    Dale , MD

## 2023-03-27 ENCOUNTER — Other Ambulatory Visit: Payer: Self-pay | Admitting: *Deleted

## 2023-03-27 DIAGNOSIS — E7800 Pure hypercholesterolemia, unspecified: Secondary | ICD-10-CM

## 2023-03-27 DIAGNOSIS — C9001 Multiple myeloma in remission: Secondary | ICD-10-CM

## 2023-03-30 ENCOUNTER — Encounter: Payer: Self-pay | Admitting: Internal Medicine

## 2023-03-30 NOTE — Assessment & Plan Note (Signed)
Describes recent near syncopal episode.  No LOC reported.  No confusion.  Had one other episode a few years ago while walking.  Unclear etiology.  Did have to go to the bathroom for bowel movement after this last episode.  Discussed possible vasovagal episode.  Denies any dizziness, light headedness, nausea, abdominal pain or chest pain.  EKG - SR with ventricular rate 56.  Blood pressure ok.  Discussed further cardiac w/up, including zio monitor, echo, etc.  Preferred cardiology referral to determine if any further cardiac w/up warranted.

## 2023-03-30 NOTE — Assessment & Plan Note (Signed)
On aricept and namenda.  Continue f/u with neurology. Recent evaluation as outlined.  MRI brain as outlined.  Continue f/u with neurology.  Stable.

## 2023-03-30 NOTE — Assessment & Plan Note (Signed)
Thyroid ultrasound - reveals changes that appear to be consistent with multinodular goiter. Saw endocrinology - recommended FNA. Molecular testing of the thyroid - no evidence of cancer. Recommended starting methimazole.  Had f/u 12/29/22 - recommended f/u thyroid ultrasound.

## 2023-03-30 NOTE — Assessment & Plan Note (Signed)
Being followed by hematology for treatment - multiple myeloma. Treated with Daratumumab and revilimid. S/p bone marro biopsy - 3% plasma cell, no M protein.  In CR. Continues aspirin daily. . Last office visit 03/17/23. M protein slightly increased from nadir. Recommended continuing with daratumumab monotherapy maintenance. Continue aspirin daily and acyclovir. Also recommended f/u PET scan.

## 2023-03-30 NOTE — Assessment & Plan Note (Signed)
Blood pressure as outlined.  Continue amlodipine, metoprolol, hydrochlorothiazide and diovan.  Follow pressures.  Follow metabolic panel.

## 2023-03-30 NOTE — Assessment & Plan Note (Signed)
On tricor.  Follow lipid panel.  Will need to see if can add to labs through cancer center.

## 2023-03-30 NOTE — Assessment & Plan Note (Signed)
Avoid antiinflammatories.  Stay hydrated.  Follow metabolic panel.   

## 2023-04-07 ENCOUNTER — Ambulatory Visit
Admission: RE | Admit: 2023-04-07 | Discharge: 2023-04-07 | Disposition: A | Discharge status: Home / Self Care | Payer: Medicare Other | Source: Ambulatory Visit | Attending: Oncology | Admitting: Oncology

## 2023-04-07 DIAGNOSIS — E041 Nontoxic single thyroid nodule: Secondary | ICD-10-CM | POA: Diagnosis not present

## 2023-04-07 DIAGNOSIS — I2510 Atherosclerotic heart disease of native coronary artery without angina pectoris: Secondary | ICD-10-CM | POA: Insufficient documentation

## 2023-04-07 DIAGNOSIS — I700 Atherosclerosis of aorta: Secondary | ICD-10-CM | POA: Insufficient documentation

## 2023-04-07 DIAGNOSIS — C9000 Multiple myeloma not having achieved remission: Secondary | ICD-10-CM | POA: Diagnosis not present

## 2023-04-07 DIAGNOSIS — C9001 Multiple myeloma in remission: Secondary | ICD-10-CM | POA: Insufficient documentation

## 2023-04-07 LAB — GLUCOSE, CAPILLARY: Glucose-Capillary: 80 mg/dL (ref 70–99)

## 2023-04-07 MED ORDER — FLUDEOXYGLUCOSE F - 18 (FDG) INJECTION
7.8600 | Freq: Once | INTRAVENOUS | Status: AC | PRN
  Administered 2023-04-07: 7.86 via INTRAVENOUS

## 2023-04-14 ENCOUNTER — Encounter: Payer: Self-pay | Admitting: Oncology

## 2023-04-14 ENCOUNTER — Inpatient Hospital Stay: Payer: Medicare Other | Attending: Oncology

## 2023-04-14 ENCOUNTER — Inpatient Hospital Stay (HOSPITAL_BASED_OUTPATIENT_CLINIC_OR_DEPARTMENT_OTHER): Payer: Medicare Other | Admitting: Oncology

## 2023-04-14 ENCOUNTER — Inpatient Hospital Stay: Payer: Medicare Other

## 2023-04-14 VITALS — BP 156/64 | HR 64 | Temp 97.1°F | Resp 18

## 2023-04-14 VITALS — BP 155/71 | HR 53 | Temp 96.6°F | Resp 18 | Wt 145.9 lb

## 2023-04-14 DIAGNOSIS — I6782 Cerebral ischemia: Secondary | ICD-10-CM | POA: Diagnosis not present

## 2023-04-14 DIAGNOSIS — Z79624 Long term (current) use of inhibitors of nucleotide synthesis: Secondary | ICD-10-CM | POA: Insufficient documentation

## 2023-04-14 DIAGNOSIS — R5383 Other fatigue: Secondary | ICD-10-CM | POA: Diagnosis not present

## 2023-04-14 DIAGNOSIS — C9000 Multiple myeloma not having achieved remission: Secondary | ICD-10-CM | POA: Insufficient documentation

## 2023-04-14 DIAGNOSIS — Z8249 Family history of ischemic heart disease and other diseases of the circulatory system: Secondary | ICD-10-CM | POA: Diagnosis not present

## 2023-04-14 DIAGNOSIS — M899 Disorder of bone, unspecified: Secondary | ICD-10-CM

## 2023-04-14 DIAGNOSIS — Z79899 Other long term (current) drug therapy: Secondary | ICD-10-CM | POA: Insufficient documentation

## 2023-04-14 DIAGNOSIS — N1831 Chronic kidney disease, stage 3a: Secondary | ICD-10-CM

## 2023-04-14 DIAGNOSIS — Z9089 Acquired absence of other organs: Secondary | ICD-10-CM | POA: Diagnosis not present

## 2023-04-14 DIAGNOSIS — I10 Essential (primary) hypertension: Secondary | ICD-10-CM | POA: Diagnosis not present

## 2023-04-14 DIAGNOSIS — E7800 Pure hypercholesterolemia, unspecified: Secondary | ICD-10-CM | POA: Diagnosis not present

## 2023-04-14 DIAGNOSIS — K219 Gastro-esophageal reflux disease without esophagitis: Secondary | ICD-10-CM | POA: Insufficient documentation

## 2023-04-14 DIAGNOSIS — Z5112 Encounter for antineoplastic immunotherapy: Secondary | ICD-10-CM | POA: Diagnosis not present

## 2023-04-14 DIAGNOSIS — Z823 Family history of stroke: Secondary | ICD-10-CM | POA: Insufficient documentation

## 2023-04-14 DIAGNOSIS — D649 Anemia, unspecified: Secondary | ICD-10-CM | POA: Insufficient documentation

## 2023-04-14 DIAGNOSIS — Z8261 Family history of arthritis: Secondary | ICD-10-CM | POA: Insufficient documentation

## 2023-04-14 DIAGNOSIS — Z803 Family history of malignant neoplasm of breast: Secondary | ICD-10-CM | POA: Insufficient documentation

## 2023-04-14 DIAGNOSIS — Z5111 Encounter for antineoplastic chemotherapy: Secondary | ICD-10-CM

## 2023-04-14 DIAGNOSIS — Z79620 Long term (current) use of immunosuppressive biologic: Secondary | ICD-10-CM | POA: Insufficient documentation

## 2023-04-14 LAB — CBC WITH DIFFERENTIAL/PLATELET
Abs Immature Granulocytes: 0.02 10*3/uL (ref 0.00–0.07)
Basophils Absolute: 0 10*3/uL (ref 0.0–0.1)
Basophils Relative: 1 %
Eosinophils Absolute: 0.1 10*3/uL (ref 0.0–0.5)
Eosinophils Relative: 1 %
HCT: 34 % — ABNORMAL LOW (ref 36.0–46.0)
Hemoglobin: 10.8 g/dL — ABNORMAL LOW (ref 12.0–15.0)
Immature Granulocytes: 0 %
Lymphocytes Relative: 22 %
Lymphs Abs: 1.2 10*3/uL (ref 0.7–4.0)
MCH: 31.9 pg (ref 26.0–34.0)
MCHC: 31.8 g/dL (ref 30.0–36.0)
MCV: 100.3 fL — ABNORMAL HIGH (ref 80.0–100.0)
Monocytes Absolute: 0.1 10*3/uL (ref 0.1–1.0)
Monocytes Relative: 2 %
Neutro Abs: 4.1 10*3/uL (ref 1.7–7.7)
Neutrophils Relative %: 74 %
Platelets: 215 10*3/uL (ref 150–400)
RBC: 3.39 MIL/uL — ABNORMAL LOW (ref 3.87–5.11)
RDW: 12.3 % (ref 11.5–15.5)
WBC: 5.5 10*3/uL (ref 4.0–10.5)
nRBC: 0 % (ref 0.0–0.2)

## 2023-04-14 LAB — COMPREHENSIVE METABOLIC PANEL
ALT: 16 U/L (ref 0–44)
AST: 23 U/L (ref 15–41)
Albumin: 4.1 g/dL (ref 3.5–5.0)
Alkaline Phosphatase: 44 U/L (ref 38–126)
Anion gap: 7 (ref 5–15)
BUN: 16 mg/dL (ref 8–23)
CO2: 23 mmol/L (ref 22–32)
Calcium: 9.3 mg/dL (ref 8.9–10.3)
Chloride: 107 mmol/L (ref 98–111)
Creatinine, Ser: 1.08 mg/dL — ABNORMAL HIGH (ref 0.44–1.00)
GFR, Estimated: 50 mL/min — ABNORMAL LOW (ref >60)
Glucose, Bld: 92 mg/dL (ref 70–99)
Potassium: 3.8 mmol/L (ref 3.5–5.1)
Sodium: 137 mmol/L (ref 135–145)
Total Bilirubin: 0.4 mg/dL (ref 0.3–1.2)
Total Protein: 7 g/dL (ref 6.5–8.1)

## 2023-04-14 LAB — LIPID PANEL
Cholesterol: 228 mg/dL — ABNORMAL HIGH (ref 0–200)
HDL: 85 mg/dL (ref >40)
LDL Cholesterol: 135 mg/dL — ABNORMAL HIGH (ref 0–99)
Total CHOL/HDL Ratio: 2.7 ratio
Triglycerides: 39 mg/dL (ref <150)
VLDL: 8 mg/dL (ref 0–40)

## 2023-04-14 MED ORDER — DIPHENHYDRAMINE HCL 25 MG PO CAPS
50.0000 mg | ORAL_CAPSULE | Freq: Once | ORAL | Status: AC
  Administered 2023-04-14: 50 mg via ORAL
  Filled 2023-04-14: qty 2

## 2023-04-14 MED ORDER — DARATUMUMAB-HYALURONIDASE-FIHJ 1800-30000 MG-UT/15ML ~~LOC~~ SOLN
1800.0000 mg | Freq: Once | SUBCUTANEOUS | Status: AC
  Administered 2023-04-14: 1800 mg via SUBCUTANEOUS
  Filled 2023-04-14: qty 15

## 2023-04-14 MED ORDER — ACETAMINOPHEN 325 MG PO TABS
650.0000 mg | ORAL_TABLET | Freq: Once | ORAL | Status: AC
  Administered 2023-04-14: 650 mg via ORAL
  Filled 2023-04-14: qty 2

## 2023-04-14 NOTE — Assessment & Plan Note (Signed)
recommend patient to avoid nephrotoxin.  Encourage oral hydration.

## 2023-04-14 NOTE — Assessment & Plan Note (Signed)
Continue Q3 months Zometa- today  next due Oct   2024 Recommend calcium 1200 mg daily. Repeat PET scan whole body, result is pending

## 2023-04-14 NOTE — Patient Instructions (Signed)
[facility]  Discharge Instructions: Thank you for choosing [facility] to provide your oncology and hematology care.  If you have a lab appointment with the Cancer Center, please go directly to the Cancer Center and check in at the registration area.  Wear comfortable clothing and clothing appropriate for easy access to any Portacath or PICC line.   We strive to give you quality time with your provider. You may need to reschedule your appointment if you arrive late (15 or more minutes).  Arriving late affects you and other patients whose appointments are after yours.  Also, if you miss three or more appointments without notifying the office, you may be dismissed from the clinic at the provider's discretion.      For prescription refill requests, have your pharmacy contact our office and allow 72 hours for refills to be completed.    Today you received the following chemotherapy and/or immunotherapy agents darzalex    To help prevent nausea and vomiting after your treatment, we encourage you to take your nausea medication as directed.  BELOW ARE SYMPTOMS THAT SHOULD BE REPORTED IMMEDIATELY: *FEVER GREATER THAN 100.4 F (38 C) OR HIGHER *CHILLS OR SWEATING *NAUSEA AND VOMITING THAT IS NOT CONTROLLED WITH YOUR NAUSEA MEDICATION *UNUSUAL SHORTNESS OF BREATH *UNUSUAL BRUISING OR BLEEDING *URINARY PROBLEMS (pain or burning when urinating, or frequent urination) *BOWEL PROBLEMS (unusual diarrhea, constipation, pain near the anus) TENDERNESS IN MOUTH AND THROAT WITH OR WITHOUT PRESENCE OF ULCERS (sore throat, sores in mouth, or a toothache) UNUSUAL RASH, SWELLING OR PAIN  UNUSUAL VAGINAL DISCHARGE OR ITCHING   Items with * indicate a potential emergency and should be followed up as soon as possible or go to the Emergency Department if any problems should occur.  Please show the CHEMOTHERAPY ALERT CARD or IMMUNOTHERAPY ALERT CARD at check-in to  the Emergency Department and triage nurse.  Should you have questions after your visit or need to cancel or reschedule your appointment, please contact [facility]  [phone] and follow the prompts.  Office hours are 8:00 a.m. to 4:30 p.m. Monday - Friday. Please note that voicemails left after 4:00 p.m. may not be returned until the following business day.  We are closed weekends and major holidays. You have access to a nurse at all times for urgent questions. Please call the main number to the clinic [phone] and follow the prompts.  For any non-urgent questions, you may also contact your provider using MyChart. We now offer e-Visits for anyone 73 and older to request care online for non-urgent symptoms. For details visit [email or web address].   Also download the MyChart app! Go to the app store, search "MyChart", open the app, select [facility], and log in with your MyChart username and password.

## 2023-04-14 NOTE — Assessment & Plan Note (Signed)
Chemotherapy plan as listed as above.

## 2023-04-14 NOTE — Assessment & Plan Note (Signed)
Stable. Continue monitor

## 2023-04-14 NOTE — Progress Notes (Signed)
Hematology/Oncology Progress note Telephone:([phone]) [phone] [phone]     CHIEF COMPLAINTS/REASON FOR VISIT:  Follow-up for multiple myeloma  ASSESSMENT & PLAN:   Cancer Staging  Multiple myeloma (HCC) Staging form: Plasma Cell Myeloma and Plasma Cell Disorders, AJCC 8th Edition - Clinical stage from 08/22/2021: RISS Stage II (Beta-2-microglobulin (mg/L): 4.1, Albumin (g/dL): 2.8, ISS: Stage II, High-risk cytogenetics: Absent, LDH: Normal) - Signed by Rickard Patience, MD on 09/06/2021   Multiple myeloma (HCC) Multiple myeloma, IgA kappa, complex hyperploid gain of 1q, S/p Daratumumab Rd.   Revlimid D1-21,  07/23/22 bone marrow biopsy  3% plasma cell, no M protein. She was in CR  Labs are reviewed and discussed with patient.  Lab Results  Component Value Date   MPROTEIN 0.1 (H) 03/17/2023   MPROTEIN 0.4 (H) 02/17/2023   MPROTEIN 0.2 (H) 01/20/2023   KAPLAMBRATIO 2.16 (H) 03/17/2023   KAPLAMBRATIO 2.47 (H) 02/17/2023   KAPLAMBRATIO 1.39 01/20/2023    M protein has slightly increased from nadir. increased light chain ratio.  Proceed with  Daratumumab monotherapy maintenance - she takes home supply of Dexamethasone prior to her Daratumumab.  On asprin 81mg  daily and Acyclovir 400mg  BID.   Bone lesion Continue Q3 months Zometa- today  next due Oct   2024 Recommend calcium 1200 mg daily. Repeat PET scan whole body, result is pending  Normocytic anemia Stable Continue monitor.   Stage 3a chronic kidney disease (HCC) recommend patient to avoid nephrotoxin.  Encourage oral hydration.  Encounter for antineoplastic chemotherapy Chemotherapy plan as listed as above.  Orders Placed This Encounter  Procedures   Kappa/lambda light chains    Standing Status:   Future    Standing Expiration Date:   07/06/2024   Multiple Myeloma Panel (SPEP&IFE w/QIG)    Standing Status:   Future    Standing Expiration Date:   07/06/2024   Comprehensive metabolic panel    Standing Status:    Future    Standing Expiration Date:   07/06/2024   CBC with Differential    Standing Status:   Future    Standing Expiration Date:   07/06/2024     Follow up  Lab/MD/Dara in 4 weeks  All questions were answered. The patient knows to call the clinic with any problems, questions or concerns.  Rickard Patience, MD, PhD [facility] Hematology Oncology 04/14/2023     HISTORY OF PRESENTING ILLNESS:   Stephanie Delacruz is a  89 y.o.  female presents for follow up of multiple myeloma.  Oncology History  Multiple myeloma (HCC)  08/22/2021 Initial Diagnosis   Multiple myeloma (HCC) IgA kappa Initial M protein 5.2, free kappa light chain ratio 89.2, Kappa/lambda ratio 13.31   08/22/2021 Cancer Staging   Staging form: Plasma Cell Myeloma and Plasma Cell Disorders, AJCC 8th Edition - Clinical stage from 08/22/2021: RISS Stage II (Beta-2-microglobulin (mg/L): 4.1, Albumin (g/dL): 2.8, ISS: Stage II, High-risk cytogenetics: Absent, LDH: Normal) - Signed by Rickard Patience, MD on 09/06/2021 Stage prefix: Initial diagnosis Beta 2 microglobulin range (mg/L): 3.5 to 5.49 Albumin range (g/dL): Less than 3.5 Cytogenetics: 1q addition   08/26/2021 - 09/01/2022 Chemotherapy   08/26/21 MYELOMA Daratumumab SQ q28d  09/06/2021 added Revlimid 10mg  x 14 days.  09/27/2021 starting cycle 2 Revlimid 10 mg 21 days on, 7 days off. Weekly Dexamethasone.    08/26/2021 Bone Marrow Biopsy   Bone marrow biopsy showed hypercellular marrow with plasma cell neoplasm. 67% plasma cells in the aspirate. Cytogenetics showed complex hyperplasia, gain of 1 q.  07/23/2022 Bone Marrow Biopsy   Bone marrow biopsy showed Hypercellular bone marrow for age with trilineage hematopoiesis and 3% plasma cells  Cytogenetics are normal.    S/p thyroid lesion biopsy, obtained by endocrinologist. + AUS INTERVAL HISTORY Stephanie Delacruz is a 89 y.o. female who has above history reviewed by me today presents for follow up visit for management of multiple  myeloma She was accompanied by her daughter today.  She denies any nausea vomiting diarrhea.   No new complaints.   Review of Systems  Constitutional:  Positive for fatigue. Negative for appetite change, chills and fever.  HENT:   Negative for hearing loss and voice change.   Eyes:  Negative for eye problems.  Respiratory:  Negative for chest tightness and cough.   Cardiovascular:  Negative for chest pain.  Gastrointestinal:  Negative for abdominal distention, abdominal pain and blood in stool.  Endocrine: Negative for hot flashes.  Genitourinary:  Negative for difficulty urinating and frequency.   Musculoskeletal:  Positive for back pain. Negative for arthralgias.  Skin:  Negative for itching and rash.  Neurological:  Negative for extremity weakness.  Hematological:  Negative for adenopathy.  Psychiatric/Behavioral:  Negative for confusion.     MEDICAL HISTORY:  Past Medical History:  Diagnosis Date   Anemia    GERD (gastroesophageal reflux disease)    Hypercholesterolemia    Hypertension    Renal insufficiency     SURGICAL HISTORY: Past Surgical History:  Procedure Laterality Date   TONSILLECTOMY     TUBAL LIGATION      SOCIAL HISTORY: Social History   Socioeconomic History   Marital status: Married    Spouse name: Not on file   Number of children: Not on file   Years of education: Not on file   Highest education level: Not on file  Occupational History   Not on file  Tobacco Use   Smoking status: Never   Smokeless tobacco: Never  Vaping Use   Vaping status: Never Used  Substance and Sexual Activity   Alcohol use: No   Drug use: No   Sexual activity: Not on file  Other Topics Concern   Not on file  Social History Narrative   Lives with husband Fayrene Fearing and daughter Marylene Land. No pets.    Social Determinants of Health   Financial Resource Strain: Not on file  Food Insecurity: Not on file  Transportation Needs: Not on file  Physical Activity: Not on file   Stress: Not on file  Social Connections: Not on file  Intimate Partner Violence: Not on file    FAMILY HISTORY: Family History  Problem Relation Age of Onset   Hypertension Mother    Arthritis Mother    Hypertension Father    Stroke Father    Breast cancer Niece     ALLERGIES:  has No Known Allergies.  MEDICATIONS:  Current Outpatient Medications  Medication Sig Dispense Refill   acyclovir (ZOVIRAX) 400 MG tablet TAKE 1 TABLET(400 MG) BY MOUTH TWICE DAILY 60 tablet 11   amLODipine (NORVASC) 2.5 MG tablet Take 2 tablets (5 mg total) by mouth daily. 180 tablet 2   ascorbic acid (VITAMIN C) 250 MG tablet daily Take 2 tablets every day by oral route.     aspirin 81 MG EC tablet Take 81 mg by mouth daily.     Calcium Carb-Cholecalciferol (CALCIUM 600+D) 600-20 MG-MCG TABS Take 1 tablet by mouth 3 (three) times daily.     dexamethasone (DECADRON) 4 MG tablet  Take 5 tablets (20 mg total) by mouth See admin instructions. Take 20mg  prior to each Daratumumab treatment. 30 tablet 1   donepezil (ARICEPT) 10 MG tablet TAKE 1 TABLET(10 MG) BY MOUTH DAILY 90 tablet 2   fenofibrate (TRICOR) 48 MG tablet TAKE 1 TABLET(48 MG) BY MOUTH DAILY 90 tablet 2   fexofenadine (ALLEGRA) 180 MG tablet Take by mouth.     hydrochlorothiazide (HYDRODIURIL) 25 MG tablet TAKE 1/2 TABLET BY MOUTH EVERY DAY 45 tablet 1   memantine (NAMENDA) 5 MG tablet Take 5 mg by mouth 2 (two) times daily.     methimazole (TAPAZOLE) 5 MG tablet Take 1 tablet (5 mg total) by mouth daily. 90 tablet 3   metoprolol succinate (TOPROL-XL) 25 MG 24 hr tablet TAKE 2 TABLETS(50 MG) BY MOUTH DAILY 180 tablet 1   montelukast (SINGULAIR) 10 MG tablet Take 1 tablet (10 mg total) by mouth See admin instructions. Take 1 tablet one day before Daratumumab injection. 60 tablet 0   Multiple Vitamins-Minerals (WOMENS MULTIVITAMIN PO) Take 1 tablet by mouth daily.     omeprazole (PRILOSEC) 20 MG capsule TAKE 1 CAPSULE(20 MG) BY MOUTH DAILY 90 capsule  3   prochlorperazine (COMPAZINE) 10 MG tablet Take 10 mg by mouth every 6 (six) hours as needed for nausea or vomiting.     valsartan (DIOVAN) 160 MG tablet Take 1 tablet (160 mg total) by mouth daily. 90 tablet 1   No current facility-administered medications for this visit.   Facility-Administered Medications Ordered in Other Visits  Medication Dose Route Frequency Provider Last Rate Last Admin   daratumumab-hyaluronidase-fihj (DARZALEX FASPRO) 1800-30000 MG-UT/15ML chemo SQ injection 1,800 mg  1,800 mg Subcutaneous Once Rickard Patience, MD         PHYSICAL EXAMINATION: ECOG PERFORMANCE STATUS: 1 - Symptomatic but completely ambulatory Vitals:   04/14/23 0917 04/14/23 0937  BP: (!) 152/68 (!) 155/71  Pulse: (!) 53   Resp: 18   Temp: (!) 96.6 F (35.9 C)    Filed Weights   04/14/23 0917  Weight: 145 lb 14.4 oz (66.2 kg)    Physical Exam Constitutional:      General: She is not in acute distress. HENT:     Head: Normocephalic and atraumatic.  Eyes:     General: No scleral icterus. Cardiovascular:     Rate and Rhythm: Normal rate and regular rhythm.     Heart sounds: Normal heart sounds.  Pulmonary:     Effort: Pulmonary effort is normal. No respiratory distress.     Breath sounds: No wheezing.  Abdominal:     General: Bowel sounds are normal. There is no distension.     Palpations: Abdomen is soft.  Musculoskeletal:        General: No deformity. Normal range of motion.     Cervical back: Normal range of motion and neck supple.  Skin:    General: Skin is warm and dry.     Findings: No erythema or rash.  Neurological:     Mental Status: She is alert and oriented to person, place, and time. Mental status is at baseline.     Cranial Nerves: No cranial nerve deficit.     Coordination: Coordination normal.  Psychiatric:        Mood and Affect: Mood normal.     LABORATORY DATA:  I have reviewed the data as listed    Latest Ref Rng & Units 04/14/2023    8:37 AM 03/17/2023     9:26 AM  02/17/2023   10:27 AM  CBC  WBC 4.0 - 10.5 K/uL 5.5  5.1  4.1   Hemoglobin 12.0 - 15.0 g/dL 30.8  65.7  84.6   Hematocrit 36.0 - 46.0 % 34.0  31.8  33.6   Platelets 150 - 400 K/uL 215  206  211       Latest Ref Rng & Units 04/14/2023    8:37 AM 03/17/2023    9:26 AM 02/17/2023   10:27 AM  CMP  Glucose 70 - 99 mg/dL 92  87  89   BUN 8 - 23 mg/dL 16  18  16    Creatinine 0.44 - 1.00 mg/dL 9.62  9.52  8.41   Sodium 135 - 145 mmol/L 137  138  141   Potassium 3.5 - 5.1 mmol/L 3.8  3.7  3.8   Chloride 98 - 111 mmol/L 107  108  107   CO2 22 - 32 mmol/L 23  25  26    Calcium 8.9 - 10.3 mg/dL 9.3  9.3  9.4   Total Protein 6.5 - 8.1 g/dL 7.0  6.7  7.0   Total Bilirubin 0.3 - 1.2 mg/dL 0.4  0.4  0.3   Alkaline Phos 38 - 126 U/L 44  38  41   AST 15 - 41 U/L 23  22  23    ALT 0 - 44 U/L 16  14  15       Iron/TIBC/Ferritin/ %Sat    Component Value Date/Time   IRON 54 07/24/2021 1212   TIBC 277 07/24/2021 1212   FERRITIN 93 07/24/2021 1212   IRONPCTSAT 20 07/24/2021 1212       RADIOGRAPHIC STUDIES: I have personally reviewed the radiological images as listed and agreed with the findings in the report. MR BRAIN WO CONTRAST  Result Date: 02/05/2023 CLINICAL DATA:  Memory loss or impairment R41.3 (ICD-10-CM). EXAM: MRI HEAD WITHOUT CONTRAST TECHNIQUE: Multiplanar, multiecho pulse sequences of the brain and surrounding structures were obtained without intravenous contrast. COMPARISON:  None Available. FINDINGS: Brain: No acute infarction, hemorrhage, hydrocephalus, extra-axial collection or mass lesion. Scattered and confluent foci of T2 hyperintensity are seen within white matter of the cerebral hemispheres, nonspecific, most likely related to chronic vessel ischemia. Moderate parenchymal volume. Vascular: Normal flow voids. Skull and upper cervical spine: Heterogeneous marrow signal within the visualized upper cervical spine, nonspecific. Sinuses/Orbits: Negative. Other: None.  IMPRESSION: 1. No acute intracranial abnormality. 2. Moderate chronic microvascular ischemic changes of the white matter. 3. Moderate parenchymal volume loss. Electronically Signed   By: Baldemar Lenis M.D.   On: 02/05/2023 14:12

## 2023-04-14 NOTE — Assessment & Plan Note (Signed)
Multiple myeloma, IgA kappa, complex hyperploid gain of 1q, S/p Daratumumab Rd.   Revlimid D1-21,  07/23/22 bone marrow biopsy  3% plasma cell, no M protein. She was in CR  Labs are reviewed and discussed with patient.  Lab Results  Component Value Date   MPROTEIN 0.1 (H) 03/17/2023   MPROTEIN 0.4 (H) 02/17/2023   MPROTEIN 0.2 (H) 01/20/2023   KAPLAMBRATIO 2.16 (H) 03/17/2023   KAPLAMBRATIO 2.47 (H) 02/17/2023   KAPLAMBRATIO 1.39 01/20/2023    M protein has slightly increased from nadir. increased light chain ratio.  Proceed with  Daratumumab monotherapy maintenance - she takes home supply of Dexamethasone prior to her Daratumumab.  On asprin 81mg  daily and Acyclovir 400mg  BID.

## 2023-04-19 LAB — MULTIPLE MYELOMA PANEL, SERUM
Albumin SerPl Elph-Mcnc: 3.8 g/dL (ref 2.9–4.4)
Albumin/Glob SerPl: 1.4 (ref 0.7–1.7)
Alpha 1: 0.3 g/dL (ref 0.0–0.4)
Alpha2 Glob SerPl Elph-Mcnc: 0.8 g/dL (ref 0.4–1.0)
B-Globulin SerPl Elph-Mcnc: 1.3 g/dL (ref 0.7–1.3)
Gamma Glob SerPl Elph-Mcnc: 0.4 g/dL (ref 0.4–1.8)
Globulin, Total: 2.9 g/dL (ref 2.2–3.9)
IgA: 226 mg/dL (ref 64–422)
IgG (Immunoglobin G), Serum: 597 mg/dL (ref 586–1602)
IgM (Immunoglobulin M), Srm: 20 mg/dL — ABNORMAL LOW (ref 26–217)
Total Protein ELP: 6.7 g/dL (ref 6.0–8.5)

## 2023-05-08 ENCOUNTER — Encounter: Payer: Self-pay | Admitting: Oncology

## 2023-05-12 ENCOUNTER — Inpatient Hospital Stay (HOSPITAL_BASED_OUTPATIENT_CLINIC_OR_DEPARTMENT_OTHER): Payer: Medicare Other | Admitting: Oncology

## 2023-05-12 ENCOUNTER — Inpatient Hospital Stay: Payer: Medicare Other | Attending: Oncology

## 2023-05-12 ENCOUNTER — Inpatient Hospital Stay: Payer: Medicare Other

## 2023-05-12 ENCOUNTER — Encounter: Payer: Self-pay | Admitting: Oncology

## 2023-05-12 VITALS — BP 149/71 | HR 64 | Temp 97.0°F | Resp 18 | Wt 145.4 lb

## 2023-05-12 DIAGNOSIS — I129 Hypertensive chronic kidney disease with stage 1 through stage 4 chronic kidney disease, or unspecified chronic kidney disease: Secondary | ICD-10-CM | POA: Insufficient documentation

## 2023-05-12 DIAGNOSIS — Z9089 Acquired absence of other organs: Secondary | ICD-10-CM | POA: Insufficient documentation

## 2023-05-12 DIAGNOSIS — C9001 Multiple myeloma in remission: Secondary | ICD-10-CM

## 2023-05-12 DIAGNOSIS — Z823 Family history of stroke: Secondary | ICD-10-CM | POA: Insufficient documentation

## 2023-05-12 DIAGNOSIS — M549 Dorsalgia, unspecified: Secondary | ICD-10-CM | POA: Diagnosis not present

## 2023-05-12 DIAGNOSIS — C9000 Multiple myeloma not having achieved remission: Secondary | ICD-10-CM | POA: Diagnosis not present

## 2023-05-12 DIAGNOSIS — E876 Hypokalemia: Secondary | ICD-10-CM | POA: Insufficient documentation

## 2023-05-12 DIAGNOSIS — Z8261 Family history of arthritis: Secondary | ICD-10-CM | POA: Insufficient documentation

## 2023-05-12 DIAGNOSIS — N1831 Chronic kidney disease, stage 3a: Secondary | ICD-10-CM | POA: Insufficient documentation

## 2023-05-12 DIAGNOSIS — I700 Atherosclerosis of aorta: Secondary | ICD-10-CM | POA: Insufficient documentation

## 2023-05-12 DIAGNOSIS — Z8249 Family history of ischemic heart disease and other diseases of the circulatory system: Secondary | ICD-10-CM | POA: Diagnosis not present

## 2023-05-12 DIAGNOSIS — Z803 Family history of malignant neoplasm of breast: Secondary | ICD-10-CM | POA: Insufficient documentation

## 2023-05-12 DIAGNOSIS — E7800 Pure hypercholesterolemia, unspecified: Secondary | ICD-10-CM | POA: Diagnosis not present

## 2023-05-12 DIAGNOSIS — M899 Disorder of bone, unspecified: Secondary | ICD-10-CM

## 2023-05-12 DIAGNOSIS — D649 Anemia, unspecified: Secondary | ICD-10-CM

## 2023-05-12 DIAGNOSIS — Z5112 Encounter for antineoplastic immunotherapy: Secondary | ICD-10-CM | POA: Insufficient documentation

## 2023-05-12 DIAGNOSIS — R5383 Other fatigue: Secondary | ICD-10-CM | POA: Insufficient documentation

## 2023-05-12 DIAGNOSIS — Z5111 Encounter for antineoplastic chemotherapy: Secondary | ICD-10-CM

## 2023-05-12 DIAGNOSIS — Z79899 Other long term (current) drug therapy: Secondary | ICD-10-CM | POA: Diagnosis not present

## 2023-05-12 LAB — CBC WITH DIFFERENTIAL/PLATELET
Abs Immature Granulocytes: 0.01 10*3/uL (ref 0.00–0.07)
Basophils Absolute: 0.1 10*3/uL (ref 0.0–0.1)
Basophils Relative: 1 %
Eosinophils Absolute: 0.1 10*3/uL (ref 0.0–0.5)
Eosinophils Relative: 2 %
HCT: 32 % — ABNORMAL LOW (ref 36.0–46.0)
Hemoglobin: 10.5 g/dL — ABNORMAL LOW (ref 12.0–15.0)
Immature Granulocytes: 0 %
Lymphocytes Relative: 33 %
Lymphs Abs: 1.7 10*3/uL (ref 0.7–4.0)
MCH: 32.6 pg (ref 26.0–34.0)
MCHC: 32.8 g/dL (ref 30.0–36.0)
MCV: 99.4 fL (ref 80.0–100.0)
Monocytes Absolute: 0.3 10*3/uL (ref 0.1–1.0)
Monocytes Relative: 6 %
Neutro Abs: 3 10*3/uL (ref 1.7–7.7)
Neutrophils Relative %: 58 %
Platelets: 204 10*3/uL (ref 150–400)
RBC: 3.22 MIL/uL — ABNORMAL LOW (ref 3.87–5.11)
RDW: 12.4 % (ref 11.5–15.5)
WBC: 5.2 10*3/uL (ref 4.0–10.5)
nRBC: 0 % (ref 0.0–0.2)

## 2023-05-12 LAB — COMPREHENSIVE METABOLIC PANEL
ALT: 16 U/L (ref 0–44)
AST: 25 U/L (ref 15–41)
Albumin: 3.8 g/dL (ref 3.5–5.0)
Alkaline Phosphatase: 41 U/L (ref 38–126)
Anion gap: 7 (ref 5–15)
BUN: 24 mg/dL — ABNORMAL HIGH (ref 8–23)
CO2: 24 mmol/L (ref 22–32)
Calcium: 9.7 mg/dL (ref 8.9–10.3)
Chloride: 109 mmol/L (ref 98–111)
Creatinine, Ser: 1.16 mg/dL — ABNORMAL HIGH (ref 0.44–1.00)
GFR, Estimated: 46 mL/min — ABNORMAL LOW (ref >60)
Glucose, Bld: 89 mg/dL (ref 70–99)
Potassium: 3.4 mmol/L — ABNORMAL LOW (ref 3.5–5.1)
Sodium: 140 mmol/L (ref 135–145)
Total Bilirubin: 0.4 mg/dL (ref 0.3–1.2)
Total Protein: 6.8 g/dL (ref 6.5–8.1)

## 2023-05-12 MED ORDER — ZOLEDRONIC ACID 4 MG/5ML IV CONC
3.3000 mg | Freq: Once | INTRAVENOUS | Status: AC
  Administered 2023-05-12: 3.3 mg via INTRAVENOUS
  Filled 2023-05-12: qty 4.13

## 2023-05-12 MED ORDER — POTASSIUM CHLORIDE CRYS ER 10 MEQ PO TBCR: 10.0000 meq | EXTENDED_RELEASE_TABLET | Freq: Every day | ORAL | Status: DC

## 2023-05-12 MED ORDER — SODIUM CHLORIDE 0.9% FLUSH
10.0000 mL | Freq: Once | INTRAVENOUS | Status: AC | PRN
  Administered 2023-05-12: 10 mL
  Filled 2023-05-12: qty 10

## 2023-05-12 MED ORDER — DARATUMUMAB-HYALURONIDASE-FIHJ 1800-30000 MG-UT/15ML ~~LOC~~ SOLN
1800.0000 mg | Freq: Once | SUBCUTANEOUS | Status: AC
  Administered 2023-05-12: 1800 mg via SUBCUTANEOUS
  Filled 2023-05-12: qty 15

## 2023-05-12 MED ORDER — DIPHENHYDRAMINE HCL 25 MG PO CAPS
50.0000 mg | ORAL_CAPSULE | Freq: Once | ORAL | Status: AC
  Administered 2023-05-12: 50 mg via ORAL
  Filled 2023-05-12: qty 2

## 2023-05-12 MED ORDER — SODIUM CHLORIDE 0.9 % IV SOLN
INTRAVENOUS | Status: DC
  Filled 2023-05-12: qty 250

## 2023-05-12 MED ORDER — ACETAMINOPHEN 325 MG PO TABS
650.0000 mg | ORAL_TABLET | Freq: Once | ORAL | Status: AC
  Administered 2023-05-12: 650 mg via ORAL
  Filled 2023-05-12: qty 2

## 2023-05-12 NOTE — Assessment & Plan Note (Signed)
Continue Q3 months Zometa- today  next due Oct   2024 Recommend calcium 1200 mg daily. Repeat PET scan whole body,  Patchy hypermetabolism throughout the spine with focal hypermetabolism in the right third rib and left sacrum, compatible with residual metabolically active multiple myeloma.

## 2023-05-12 NOTE — Assessment & Plan Note (Signed)
Stable. Continue monitor

## 2023-05-12 NOTE — Patient Instructions (Signed)
[facility]  Discharge Instructions: Thank you for choosing [facility] to provide your oncology and hematology care.  If you have a lab appointment with the Cancer Center, please go directly to the Cancer Center and check in at the registration area.  Wear comfortable clothing and clothing appropriate for easy access to any Portacath or PICC line.   We strive to give you quality time with your provider. You may need to reschedule your appointment if you arrive late (15 or more minutes).  Arriving late affects you and other patients whose appointments are after yours.  Also, if you miss three or more appointments without notifying the office, you may be dismissed from the clinic at the provider's discretion.      For prescription refill requests, have your pharmacy contact our office and allow 72 hours for refills to be completed.    Today you received the following chemotherapy and/or immunotherapy agents- daratumumab      To help prevent nausea and vomiting after your treatment, we encourage you to take your nausea medication as directed.  BELOW ARE SYMPTOMS THAT SHOULD BE REPORTED IMMEDIATELY: *FEVER GREATER THAN 100.4 F (38 C) OR HIGHER *CHILLS OR SWEATING *NAUSEA AND VOMITING THAT IS NOT CONTROLLED WITH YOUR NAUSEA MEDICATION *UNUSUAL SHORTNESS OF BREATH *UNUSUAL BRUISING OR BLEEDING *URINARY PROBLEMS (pain or burning when urinating, or frequent urination) *BOWEL PROBLEMS (unusual diarrhea, constipation, pain near the anus) TENDERNESS IN MOUTH AND THROAT WITH OR WITHOUT PRESENCE OF ULCERS (sore throat, sores in mouth, or a toothache) UNUSUAL RASH, SWELLING OR PAIN  UNUSUAL VAGINAL DISCHARGE OR ITCHING   Items with * indicate a potential emergency and should be followed up as soon as possible or go to the Emergency Department if any problems should occur.  Please show the CHEMOTHERAPY ALERT CARD or IMMUNOTHERAPY ALERT CARD at check-in  to the Emergency Department and triage nurse.  Should you have questions after your visit or need to cancel or reschedule your appointment, please contact [facility]  [phone] and follow the prompts.  Office hours are 8:00 a.m. to 4:30 p.m. Monday - Friday. Please note that voicemails left after 4:00 p.m. may not be returned until the following business day.  We are closed weekends and major holidays. You have access to a nurse at all times for urgent questions. Please call the main number to the clinic [phone] and follow the prompts.  For any non-urgent questions, you may also contact your provider using MyChart. We now offer e-Visits for anyone 18 and older to request care online for non-urgent symptoms. For details visit [email or web address].   Also download the MyChart app! Go to the app store, search "MyChart", open the app, select [facility], and log in with your MyChart username and password.    

## 2023-05-12 NOTE — Progress Notes (Signed)
Hematology/Oncology Progress note [phone] [phone]     CHIEF COMPLAINTS/REASON FOR VISIT:  Follow-up for multiple myeloma  ASSESSMENT & PLAN:   Cancer Staging  Multiple myeloma (HCC) Staging form: Plasma Cell Myeloma and Plasma Cell Disorders, AJCC 8th Edition - Clinical stage from 08/22/2021: RISS Stage II (Beta-2-microglobulin (mg/L): 4.1, Albumin (g/dL): 2.8, ISS: Stage II, High-risk cytogenetics: Absent, LDH: Normal) - Signed by Rickard Patience, MD on 09/06/2021   Multiple myeloma (HCC) Multiple myeloma, IgA kappa, complex hyperploid gain of 1q, S/p Daratumumab Rd.   Revlimid D1-21,  07/23/22 bone marrow biopsy  3% plasma cell, no M protein.   Labs are reviewed and discussed with patient.  Lab Results  Component Value Date   MPROTEIN Comment (A) 04/14/2023   MPROTEIN 0.1 (H) 03/17/2023   MPROTEIN 0.4 (H) 02/17/2023   KAPLAMBRATIO 2.68 (H) 04/14/2023   KAPLAMBRATIO 2.16 (H) 03/17/2023   KAPLAMBRATIO 2.47 (H) 02/17/2023    M protein has slightly increased from nadir. increased light chain ratio.  Proceed with  Daratumumab monotherapy maintenance - she takes home supply of Dexamethasone prior to her Daratumumab.  On asprin 81mg  daily and Acyclovir 400mg  BID.   Bone lesion Continue Q3 months Zometa- today  next due Oct   2024 Recommend calcium 1200 mg daily. Repeat PET scan whole body,  Patchy hypermetabolism throughout the spine with focal hypermetabolism in the right third rib and left sacrum, compatible with residual metabolically active multiple myeloma.  Normocytic anemia Stable Continue monitor.   Stage 3a chronic kidney disease (HCC) recommend patient to avoid nephrotoxin.  Encourage oral hydration.  Encounter for antineoplastic chemotherapy Chemotherapy plan as listed as above.  Hypokalemia Recommend potassium chloride daily x 1 week.   Orders Placed This Encounter  Procedures   Kappa/lambda light chains    Standing Status:    Future    Standing Expiration Date:   08/03/2024   Multiple Myeloma Panel (SPEP&IFE w/QIG)    Standing Status:   Future    Standing Expiration Date:   08/03/2024   Comprehensive metabolic panel    Standing Status:   Future    Standing Expiration Date:   08/03/2024   CBC with Differential    Standing Status:   Future    Standing Expiration Date:   08/03/2024     Follow up  Lab/MD/Dara in 4 weeks  All questions were answered. The patient knows to call the clinic with any problems, questions or concerns.  Rickard Patience, MD, PhD [facility] Hematology Oncology 05/12/2023     HISTORY OF PRESENTING ILLNESS:   Stephanie Delacruz is a  89 y.o.  female presents for follow up of multiple myeloma.  Oncology History  Multiple myeloma (HCC)  08/22/2021 Initial Diagnosis   Multiple myeloma (HCC) IgA kappa Initial M protein 5.2, free kappa light chain ratio 89.2, Kappa/lambda ratio 13.31   08/22/2021 Cancer Staging   Staging form: Plasma Cell Myeloma and Plasma Cell Disorders, AJCC 8th Edition - Clinical stage from 08/22/2021: RISS Stage II (Beta-2-microglobulin (mg/L): 4.1, Albumin (g/dL): 2.8, ISS: Stage II, High-risk cytogenetics: Absent, LDH: Normal) - Signed by Rickard Patience, MD on 09/06/2021 Stage prefix: Initial diagnosis Beta 2 microglobulin range (mg/L): 3.5 to 5.49 Albumin range (g/dL): Less than 3.5 Cytogenetics: 1q addition   08/26/2021 - 09/01/2022 Chemotherapy   08/26/21 MYELOMA Daratumumab SQ q28d  09/06/2021 added Revlimid 10mg  x 14 days.  09/27/2021 starting cycle 2 Revlimid 10 mg 21 days on, 7 days off. Weekly Dexamethasone.  08/26/2021 Bone Marrow Biopsy   Bone marrow biopsy showed hypercellular marrow with plasma cell neoplasm. 67% plasma cells in the aspirate. Cytogenetics showed complex hyperplasia, gain of 1 q.    07/23/2022 Bone Marrow Biopsy   Bone marrow biopsy showed Hypercellular bone marrow for age with trilineage hematopoiesis and 3% plasma cells  Cytogenetics are normal.    04/20/2023 Imaging   PET scan showed  1. Patchy hypermetabolism throughout the spine with focal hypermetabolism in the right third rib and left sacrum, compatible with residual metabolically active multiple myeloma. 2. Hypermetabolic 5 mm right cervical lymph node, indeterminate.Recommend attention on follow-up. 3. 3.6 cm thyroid nodule, previously evaluated by ultrasound 08/27/2022 and biopsy 10/07/2022. Please refer to pathology report for further evaluation. No follow-up recommended unless clinically warranted. (Ref: J Am Coll Radiol. 2015 Feb;12(2): 143-50). 4. Aortic atherosclerosis (ICD10-I70.0). Coronary artery calcification.    S/p thyroid lesion biopsy, obtained by endocrinologist. + AUS INTERVAL HISTORY Stephanie Delacruz is a 89 y.o. female who has above history reviewed by me today presents for follow up visit for management of multiple myeloma She was accompanied by her daughter today.  She denies any nausea vomiting diarrhea.   No new complaints.   Review of Systems  Constitutional:  Positive for fatigue. Negative for appetite change, chills and fever.  HENT:   Negative for hearing loss and voice change.   Eyes:  Negative for eye problems.  Respiratory:  Negative for chest tightness and cough.   Cardiovascular:  Negative for chest pain.  Gastrointestinal:  Negative for abdominal distention, abdominal pain and blood in stool.  Endocrine: Negative for hot flashes.  Genitourinary:  Negative for difficulty urinating and frequency.   Musculoskeletal:  Positive for back pain. Negative for arthralgias.  Skin:  Negative for itching and rash.  Neurological:  Negative for extremity weakness.  Hematological:  Negative for adenopathy.  Psychiatric/Behavioral:  Negative for confusion.     MEDICAL HISTORY:  Past Medical History:  Diagnosis Date   Anemia    GERD (gastroesophageal reflux disease)    Hypercholesterolemia    Hypertension    Renal insufficiency     SURGICAL  HISTORY: Past Surgical History:  Procedure Laterality Date   TONSILLECTOMY     TUBAL LIGATION      SOCIAL HISTORY: Social History   Socioeconomic History   Marital status: Married    Spouse name: Not on file   Number of children: Not on file   Years of education: Not on file   Highest education level: Not on file  Occupational History   Not on file  Tobacco Use   Smoking status: Never   Smokeless tobacco: Never  Vaping Use   Vaping status: Never Used  Substance and Sexual Activity   Alcohol use: No   Drug use: No   Sexual activity: Not on file  Other Topics Concern   Not on file  Social History Narrative   Lives with husband Fayrene Fearing and daughter Marylene Land. No pets.    Social Determinants of Health   Financial Resource Strain: Not on file  Food Insecurity: Not on file  Transportation Needs: Not on file  Physical Activity: Not on file  Stress: Not on file  Social Connections: Not on file  Intimate Partner Violence: Not on file    FAMILY HISTORY: Family History  Problem Relation Age of Onset   Hypertension Mother    Arthritis Mother    Hypertension Father    Stroke Father    Breast cancer  Niece     ALLERGIES:  has No Known Allergies.  MEDICATIONS:  Current Outpatient Medications  Medication Sig Dispense Refill   acyclovir (ZOVIRAX) 400 MG tablet TAKE 1 TABLET(400 MG) BY MOUTH TWICE DAILY 60 tablet 11   amLODipine (NORVASC) 2.5 MG tablet Take 2 tablets (5 mg total) by mouth daily. 180 tablet 2   ascorbic acid (VITAMIN C) 250 MG tablet daily Take 2 tablets every day by oral route.     aspirin 81 MG EC tablet Take 81 mg by mouth daily.     Calcium Carb-Cholecalciferol (CALCIUM 600+D) 600-20 MG-MCG TABS Take 1 tablet by mouth 3 (three) times daily.     dexamethasone (DECADRON) 4 MG tablet Take 5 tablets (20 mg total) by mouth See admin instructions. Take 20mg  prior to each Daratumumab treatment. 30 tablet 1   donepezil (ARICEPT) 10 MG tablet TAKE 1 TABLET(10 MG) BY  MOUTH DAILY 90 tablet 2   fenofibrate (TRICOR) 48 MG tablet TAKE 1 TABLET(48 MG) BY MOUTH DAILY 90 tablet 2   fexofenadine (ALLEGRA) 180 MG tablet Take by mouth.     hydrochlorothiazide (HYDRODIURIL) 25 MG tablet TAKE 1/2 TABLET BY MOUTH EVERY DAY 45 tablet 1   memantine (NAMENDA) 5 MG tablet Take 5 mg by mouth 2 (two) times daily.     methimazole (TAPAZOLE) 5 MG tablet Take 1 tablet (5 mg total) by mouth daily. 90 tablet 3   metoprolol succinate (TOPROL-XL) 25 MG 24 hr tablet TAKE 2 TABLETS(50 MG) BY MOUTH DAILY 180 tablet 1   montelukast (SINGULAIR) 10 MG tablet Take 1 tablet (10 mg total) by mouth See admin instructions. Take 1 tablet one day before Daratumumab injection. 60 tablet 0   Multiple Vitamins-Minerals (WOMENS MULTIVITAMIN PO) Take 1 tablet by mouth daily.     omeprazole (PRILOSEC) 20 MG capsule TAKE 1 CAPSULE(20 MG) BY MOUTH DAILY 90 capsule 3   potassium chloride (KLOR-CON M) 10 MEQ tablet Take 1 tablet (10 mEq total) by mouth daily.     prochlorperazine (COMPAZINE) 10 MG tablet Take 10 mg by mouth every 6 (six) hours as needed for nausea or vomiting.     valsartan (DIOVAN) 160 MG tablet Take 1 tablet (160 mg total) by mouth daily. 90 tablet 1   No current facility-administered medications for this visit.   Facility-Administered Medications Ordered in Other Visits  Medication Dose Route Frequency Provider Last Rate Last Admin   0.9 %  sodium chloride infusion   Intravenous Continuous Rickard Patience, MD   Stopped at 05/12/23 1122     PHYSICAL EXAMINATION: ECOG PERFORMANCE STATUS: 1 - Symptomatic but completely ambulatory Vitals:   05/12/23 0901 05/12/23 0910  BP: (!) 168/66 (!) 149/71  Pulse: 64   Resp: 18   Temp: (!) 97 F (36.1 C)   SpO2: 100%    Filed Weights   05/12/23 0901  Weight: 145 lb 6.4 oz (66 kg)    Physical Exam Constitutional:      General: She is not in acute distress. HENT:     Head: Normocephalic and atraumatic.  Eyes:     General: No scleral  icterus. Cardiovascular:     Rate and Rhythm: Normal rate and regular rhythm.     Heart sounds: Normal heart sounds.  Pulmonary:     Effort: Pulmonary effort is normal. No respiratory distress.     Breath sounds: No wheezing.  Abdominal:     General: Bowel sounds are normal. There is no distension.  Palpations: Abdomen is soft.  Musculoskeletal:        General: No deformity. Normal range of motion.     Cervical back: Normal range of motion and neck supple.  Skin:    General: Skin is warm and dry.     Findings: No erythema or rash.  Neurological:     Mental Status: She is alert and oriented to person, place, and time. Mental status is at baseline.     Cranial Nerves: No cranial nerve deficit.     Coordination: Coordination normal.  Psychiatric:        Mood and Affect: Mood normal.     LABORATORY DATA:  I have reviewed the data as listed    Latest Ref Rng & Units 05/12/2023    8:50 AM 04/14/2023    8:37 AM 03/17/2023    9:26 AM  CBC  WBC 4.0 - 10.5 K/uL 5.2  5.5  5.1   Hemoglobin 12.0 - 15.0 g/dL 16.1  09.6  04.5   Hematocrit 36.0 - 46.0 % 32.0  34.0  31.8   Platelets 150 - 400 K/uL 204  215  206       Latest Ref Rng & Units 05/12/2023    8:50 AM 04/14/2023    8:37 AM 03/17/2023    9:26 AM  CMP  Glucose 70 - 99 mg/dL 89  92  87   BUN 8 - 23 mg/dL 24  16  18    Creatinine 0.44 - 1.00 mg/dL 4.09  8.11  9.14   Sodium 135 - 145 mmol/L 140  137  138   Potassium 3.5 - 5.1 mmol/L 3.4  3.8  3.7   Chloride 98 - 111 mmol/L 109  107  108   CO2 22 - 32 mmol/L 24  23  25    Calcium 8.9 - 10.3 mg/dL 9.7  9.3  9.3   Total Protein 6.5 - 8.1 g/dL 6.8  7.0  6.7   Total Bilirubin 0.3 - 1.2 mg/dL 0.4  0.4  0.4   Alkaline Phos 38 - 126 U/L 41  44  38   AST 15 - 41 U/L 25  23  22    ALT 0 - 44 U/L 16  16  14       Iron/TIBC/Ferritin/ %Sat    Component Value Date/Time   IRON 54 07/24/2021 1212   TIBC 277 07/24/2021 1212   FERRITIN 93 07/24/2021 1212   IRONPCTSAT 20 07/24/2021 1212        RADIOGRAPHIC STUDIES: I have personally reviewed the radiological images as listed and agreed with the findings in the report. NM PET Image Restage (PS) Whole Body  Result Date: 04/20/2023 CLINICAL DATA:  Subsequent treatment strategy for multiple myeloma. EXAM: NUCLEAR MEDICINE PET WHOLE BODY TECHNIQUE: 7.9 mCi F-18 FDG was injected intravenously. Full-ring PET imaging was performed from the head to foot after the radiotracer. CT data was obtained and used for attenuation correction and anatomic localization. Fasting blood glucose: 80 mg/dl COMPARISON:  78/29/5621. FINDINGS: Mediastinal blood pool activity: SUV max 3.2 HEAD/NECK: Right level II/III lymph node measures 5 mm (6/47), SUV max 4.9. No additional abnormal hypermetabolism. Incidental CT findings: None. CHEST: No abnormal hypermetabolism. Incidental CT findings: Thyroid nodule in the isthmus and left lobe measures 2.6 x 3.6 cm. Atherosclerotic calcification of the aorta, aortic valve and coronary arteries. Heart is enlarged. No pericardial or pleural effusion. ABDOMEN/PELVIS: No abnormal hypermetabolism. Incidental CT findings: Liver, gallbladder, adrenal glands, kidneys, spleen, pancreas, stomach and bowel are  grossly unremarkable. SKELETON: Patchy hypermetabolism throughout the spine. Focal hypermetabolism in the lateral aspect the right third rib, SUV max 3.6. Focal hypermetabolism in the left sacrum, SUV max 3.2. Incidental CT findings: Degenerative changes in the spine. EXTREMITIES: Possible degenerative uptake in the left foot. Otherwise, no abnormal hypermetabolism. Incidental CT findings: None. IMPRESSION: 1. Patchy hypermetabolism throughout the spine with focal hypermetabolism in the right third rib and left sacrum, compatible with residual metabolically active multiple myeloma. 2. Hypermetabolic 5 mm right cervical lymph node, indeterminate. Recommend attention on follow-up. 3. 3.6 cm thyroid nodule, previously evaluated by  ultrasound 08/27/2022 and biopsy 10/07/2022. Please refer to pathology report for further evaluation. No follow-up recommended unless clinically warranted. (Ref: J Am Coll Radiol. 2015 Feb;12(2): 143-50). 4. Aortic atherosclerosis (ICD10-I70.0). Coronary artery calcification. Electronically Signed   By: Leanna Battles M.D.   On: 04/20/2023 10:24

## 2023-05-12 NOTE — Assessment & Plan Note (Signed)
Recommend potassium chloride daily x 1 week.

## 2023-05-12 NOTE — Assessment & Plan Note (Signed)
recommend patient to avoid nephrotoxin.  Encourage oral hydration.

## 2023-05-12 NOTE — Assessment & Plan Note (Signed)
Chemotherapy plan as listed as above.

## 2023-05-12 NOTE — Assessment & Plan Note (Addendum)
Multiple myeloma, IgA kappa, complex hyperploid gain of 1q, S/p Daratumumab Rd.   Revlimid D1-21,  07/23/22 bone marrow biopsy  3% plasma cell, no M protein.   Labs are reviewed and discussed with patient.  Lab Results  Component Value Date   MPROTEIN Comment (A) 04/14/2023   MPROTEIN 0.1 (H) 03/17/2023   MPROTEIN 0.4 (H) 02/17/2023   KAPLAMBRATIO 2.68 (H) 04/14/2023   KAPLAMBRATIO 2.16 (H) 03/17/2023   KAPLAMBRATIO 2.47 (H) 02/17/2023    M protein has slightly increased from nadir. increased light chain ratio.  Proceed with  Daratumumab monotherapy maintenance - she takes home supply of Dexamethasone prior to her Daratumumab.  On asprin 81mg  daily and Acyclovir 400mg  BID.

## 2023-05-13 LAB — KAPPA/LAMBDA LIGHT CHAINS
Kappa free light chain: 11.8 mg/L (ref 3.3–19.4)
Kappa, lambda light chain ratio: 2.68 — ABNORMAL HIGH (ref 0.26–1.65)
Lambda free light chains: 4.4 mg/L — ABNORMAL LOW (ref 5.7–26.3)

## 2023-05-14 LAB — MULTIPLE MYELOMA PANEL, SERUM
Albumin SerPl Elph-Mcnc: 3.6 g/dL (ref 2.9–4.4)
Albumin/Glob SerPl: 1.4 (ref 0.7–1.7)
Alpha 1: 0.3 g/dL (ref 0.0–0.4)
Alpha2 Glob SerPl Elph-Mcnc: 0.8 g/dL (ref 0.4–1.0)
B-Globulin SerPl Elph-Mcnc: 1.2 g/dL (ref 0.7–1.3)
Gamma Glob SerPl Elph-Mcnc: 0.4 g/dL (ref 0.4–1.8)
Globulin, Total: 2.6 g/dL (ref 2.2–3.9)
IgA: 197 mg/dL (ref 64–422)
IgG (Immunoglobin G), Serum: 524 mg/dL — ABNORMAL LOW (ref 586–1602)
IgM (Immunoglobulin M), Srm: 19 mg/dL — ABNORMAL LOW (ref 26–217)
M Protein SerPl Elph-Mcnc: 0.1 g/dL — ABNORMAL HIGH
Total Protein ELP: 6.2 g/dL (ref 6.0–8.5)

## 2023-05-19 ENCOUNTER — Other Ambulatory Visit: Payer: Self-pay | Admitting: Internal Medicine

## 2023-05-20 DIAGNOSIS — R55 Syncope and collapse: Secondary | ICD-10-CM | POA: Diagnosis not present

## 2023-05-20 DIAGNOSIS — C9000 Multiple myeloma not having achieved remission: Secondary | ICD-10-CM | POA: Diagnosis not present

## 2023-05-20 DIAGNOSIS — R413 Other amnesia: Secondary | ICD-10-CM | POA: Diagnosis not present

## 2023-05-20 DIAGNOSIS — R001 Bradycardia, unspecified: Secondary | ICD-10-CM | POA: Diagnosis not present

## 2023-06-05 ENCOUNTER — Encounter: Payer: Self-pay | Admitting: Cardiology

## 2023-06-05 ENCOUNTER — Ambulatory Visit: Payer: Medicare Other

## 2023-06-05 ENCOUNTER — Ambulatory Visit: Payer: Medicare Other | Attending: Cardiology | Admitting: Cardiology

## 2023-06-05 ENCOUNTER — Other Ambulatory Visit: Payer: Self-pay | Admitting: Internal Medicine

## 2023-06-05 VITALS — BP 142/77 | HR 53 | Ht 64.0 in | Wt 146.4 lb

## 2023-06-05 DIAGNOSIS — R001 Bradycardia, unspecified: Secondary | ICD-10-CM | POA: Diagnosis not present

## 2023-06-05 DIAGNOSIS — I10 Essential (primary) hypertension: Secondary | ICD-10-CM | POA: Diagnosis not present

## 2023-06-05 DIAGNOSIS — R55 Syncope and collapse: Secondary | ICD-10-CM

## 2023-06-05 MED ORDER — METOPROLOL SUCCINATE ER 25 MG PO TB24: 25.0000 mg | ORAL_TABLET | Freq: Every day | ORAL | 0 refills | Status: DC

## 2023-06-05 NOTE — Progress Notes (Signed)
Cardiology Office Note:    Date:  06/05/2023   ID:  Stephanie Delacruz, DOB Aug 18, 1933, MRN [id number]  PCP:  Dale [place], MD   [facility] Providers Cardiologist:  Debbe Odea, MD     Referring MD: Dale Fairburn, MD   Chief Complaint  Patient presents with   New Patient (Initial Visit)    Referred for cardiac evaluation of bradycardia with near syncope episode with no known cardiac history.    History of Present Illness:    Stephanie Delacruz is a 89 y.o. female with a hx of hypertension, multiple myeloma, CKD, presenting with presyncope and bradycardia.  Patient states passing out roughly 6 months ago.  She was at her kitchen sink, washing a cup.  She suddenly felt dizzy prior to passing out.  Denies chest pain or shortness of breath.  Had a previous episode of passing out about 3 years ago while in [place] with family.  She was walking and suddenly fell, her husband prevented her from falling.  She has not had any episodes to May of this year.  Has not had any further episodes.  Her heart rates have noted to be low, initially thought this was secondary to Aricept.  Aricept dose was reduced without improvement in heart rates.  Pulse has been in the low to high 50s at home.  Endorsed taking Toprol-XL 50 mg daily for over a year now.  Also takes HCTZ, Norvasc, valsartan.  Past Medical History:  Diagnosis Date   Anemia    GERD (gastroesophageal reflux disease)    Hypercholesterolemia    Hypertension    Renal insufficiency     Past Surgical History:  Procedure Laterality Date   TONSILLECTOMY     TUBAL LIGATION      Current Medications: Current Meds  Medication Sig   acyclovir (ZOVIRAX) 400 MG tablet TAKE 1 TABLET(400 MG) BY MOUTH TWICE DAILY   amLODipine (NORVASC) 2.5 MG tablet Take 2 tablets (5 mg total) by mouth daily.   ascorbic acid (VITAMIN C) 250 MG tablet daily Take 2 tablets every day by oral route.   aspirin 81 MG EC tablet Take 81 mg by mouth  daily.   Calcium Carb-Cholecalciferol (CALCIUM 600+D) 600-20 MG-MCG TABS Take 1 tablet by mouth 3 (three) times daily.   dexamethasone (DECADRON) 4 MG tablet Take 5 tablets (20 mg total) by mouth See admin instructions. Take 20mg  prior to each Daratumumab treatment.   donepezil (ARICEPT) 10 MG tablet TAKE 1 TABLET(10 MG) BY MOUTH DAILY (Patient taking differently: Take 5 mg by mouth at bedtime.)   fenofibrate (TRICOR) 48 MG tablet TAKE 1 TABLET(48 MG) BY MOUTH DAILY   fexofenadine (ALLEGRA) 180 MG tablet Take by mouth.   hydrochlorothiazide (HYDRODIURIL) 25 MG tablet TAKE 1/2 TABLET BY MOUTH EVERY DAY   memantine (NAMENDA) 5 MG tablet Take 5 mg by mouth 2 (two) times daily.   methimazole (TAPAZOLE) 5 MG tablet TAKE 1 TABLET(5 MG) BY MOUTH DAILY   montelukast (SINGULAIR) 10 MG tablet Take 1 tablet (10 mg total) by mouth See admin instructions. Take 1 tablet one day before Daratumumab injection.   Multiple Vitamins-Minerals (WOMENS MULTIVITAMIN PO) Take 1 tablet by mouth daily.   omeprazole (PRILOSEC) 20 MG capsule TAKE 1 CAPSULE(20 MG) BY MOUTH DAILY   potassium chloride (KLOR-CON M) 10 MEQ tablet Take 1 tablet (10 mEq total) by mouth daily.   prochlorperazine (COMPAZINE) 10 MG tablet Take 10 mg by mouth every 6 (six) hours as needed  for nausea or vomiting.   valsartan (DIOVAN) 160 MG tablet TAKE 1 TABLET(160 MG) BY MOUTH DAILY   [DISCONTINUED] metoprolol succinate (TOPROL-XL) 25 MG 24 hr tablet TAKE 2 TABLETS(50 MG) BY MOUTH DAILY     Allergies:   Patient has no known allergies.   Social History   Socioeconomic History   Marital status: Married    Spouse name: Not on file   Number of children: Not on file   Years of education: Not on file   Highest education level: Not on file  Occupational History   Not on file  Tobacco Use   Smoking status: Never   Smokeless tobacco: Never  Vaping Use   Vaping status: Never Used  Substance and Sexual Activity   Alcohol use: No   Drug use: No    Sexual activity: Not on file  Other Topics Concern   Not on file  Social History Narrative   Lives with husband Fayrene Fearing and daughter Marylene Land. No pets.    Social Determinants of Health   Financial Resource Strain: Not on file  Food Insecurity: Not on file  Transportation Needs: Not on file  Physical Activity: Not on file  Stress: Not on file  Social Connections: Not on file     Family History: The patient's family history includes Arthritis in her mother; Breast cancer in her niece; Hypertension in her father and mother; Stroke in her father.  ROS:   Please see the history of present illness.     All other systems reviewed and are negative.  EKGs/Labs/Other Studies Reviewed:    The following studies were reviewed today:  EKG Interpretation Date/Time:  Friday June 05 2023 11:35:04 EST Ventricular Rate:  53 PR Interval:  238 QRS Duration:  88 QT Interval:  438 QTC Calculation: 410 R Axis:   -12  Text Interpretation: Sinus bradycardia with 1st degree A-V block Minimal voltage criteria for LVH, may be normal variant ( R in aVL ) Confirmed by Debbe Odea ([id number]) on 06/05/2023 11:41:59 AM    Recent Labs: 11/17/2022: TSH 1.26 05/12/2023: ALT 16; BUN 24; Creatinine, Ser 1.16; Hemoglobin 10.5; Platelets 204; Potassium 3.4; Sodium 140  Recent Lipid Panel    Component Value Date/Time   CHOL 228 (H) 04/14/2023 0837   TRIG 39 04/14/2023 0837   HDL 85 04/14/2023 0837   CHOLHDL 2.7 04/14/2023 0837   VLDL 8 04/14/2023 0837   LDLCALC 135 (H) 04/14/2023 0837     Risk Assessment/Calculations:     HYPERTENSION CONTROL Vitals:   06/05/23 1129 06/05/23 1137  BP: (!) 144/74 (!) 142/77    The patient's blood pressure is elevated above target today.  In order to address the patient's elevated BP: Blood pressure will be monitored at home to determine if medication changes need to be made.            Physical Exam:    VS:  BP (!) 142/77 (BP Location: Left Arm,  Patient Position: Sitting, Cuff Size: Large)   Pulse (!) 53   Ht 5\' 4"  (1.626 m)   Wt 146 lb 6.4 oz (66.4 kg)   SpO2 98%   BMI 25.13 kg/m     Wt Readings from Last 3 Encounters:  06/05/23 146 lb 6.4 oz (66.4 kg)  05/12/23 145 lb 6.4 oz (66 kg)  04/14/23 145 lb 14.4 oz (66.2 kg)     GEN:  Well nourished, well developed in no acute distress HEENT: Normal NECK: No JVD; No carotid bruits CARDIAC:  RRR, no murmurs, rubs, gallops RESPIRATORY:  Clear to auscultation without rales, wheezing or rhonchi  ABDOMEN: Soft, non-tender, non-distended MUSCULOSKELETAL:  No edema; No deformity  SKIN: Warm and dry NEUROLOGIC:  Alert and oriented x 3 PSYCHIATRIC:  Normal affect   ASSESSMENT:    1. Syncope and collapse   2. Bradycardia   3. Primary hypertension    PLAN:    In order of problems listed above:  syncope, etiology unclear.  Fortunately has not had any episodes since.  Will evaluate for arrhythmias with cardiac monitor, obtain echo to evaluate any structural abnormalities. sinus bradycardia, heart rate 53.  Due to history of syncope, reduce Toprol-XL to 25 mg daily. Hypertension, BP 140s.  Will let BP run high up to 150s okay given history of syncope and patient's age.  Consider titrating Norvasc or ARB if BP becomes too elevated.  Follow-up after cardiac testing.    Medication Adjustments/Labs and Tests Ordered: Current medicines are reviewed at length with the patient today.  Concerns regarding medicines are outlined above.  Orders Placed This Encounter  Procedures   LONG TERM MONITOR (3-14 DAYS)   EKG 12-Lead   ECHOCARDIOGRAM COMPLETE   Meds ordered this encounter  Medications   metoprolol succinate (TOPROL-XL) 25 MG 24 hr tablet    Sig: Take 1 tablet (25 mg total) by mouth daily.    Dispense:  90 tablet    Refill:  0    Patient Instructions  Medication Instructions:   DECREASE Metoprolol Succinate - Take one tablet ( 25mg ) by mouth daily.   *If you need a refill  on your cardiac medications before your next appointment, please call your pharmacy*   Lab Work:  None Ordered  If you have labs (blood work) drawn today and your tests are completely normal, you will receive your results only by: MyChart Message (if you have MyChart) OR A paper copy in the mail If you have any lab test that is abnormal or we need to change your treatment, we will call you to review the results.   Testing/Procedures:  Your physician has requested that you have an echocardiogram. Echocardiography is a painless test that uses sound waves to create images of your heart. It provides your doctor with information about the size and shape of your heart and how well your heart's chambers and valves are working. This procedure takes approximately one hour. There are no restrictions for this procedure. Please do NOT wear cologne, perfume, aftershave, or lotions (deodorant is allowed). Please arrive 15 minutes prior to your appointment time.  Please note: We ask at that you not bring children with you during ultrasound (echo/ vascular) testing. Due to room size and safety concerns, children are not allowed in the ultrasound rooms during exams. Our front office staff cannot provide observation of children in our lobby area while testing is being conducted. An adult accompanying a patient to their appointment will only be allowed in the ultrasound room at the discretion of the ultrasound technician under special circumstances. We apologize for any inconvenience.   Your physician has recommended that you wear a Zio monitor.   This monitor is a medical device that records the heart's electrical activity. Doctors most often use these monitors to diagnose arrhythmias. Arrhythmias are problems with the speed or rhythm of the heartbeat. The monitor is a small device applied to your chest. You can wear one while you do your normal daily activities. While wearing this monitor if you have any  symptoms  to push the button and record what you felt. Once you have worn this monitor for the period of time provider prescribed (Usually 14 days), you will return the monitor device in the postage paid box. Once it is returned they will download the data collected and provide [place] with a report which the provider will then review and we will call you with those results. Important tips:  Avoid showering during the first 24 hours of wearing the monitor. Avoid excessive sweating to help maximize wear time. Do not submerge the device, no hot tubs, and no swimming pools. Keep any lotions or oils away from the patch. After 24 hours you may shower with the patch on. Take brief showers with your back facing the shower head.  Do not remove patch once it has been placed because that will interrupt data and decrease adhesive wear time. Push the button when you have any symptoms and write down what you were feeling. Once you have completed wearing your monitor, remove and place into box which has postage paid and place in your outgoing mailbox.  If for some reason you have misplaced your box then call our office and we can provide another box and/or mail it off for you.   Follow-Up: At [facility], you and your health needs are our priority.  As part of our continuing mission to provide you with exceptional heart care, we have created designated Provider Care Teams.  These Care Teams include your primary Cardiologist (physician) and Advanced Practice Providers (APPs -  Physician Assistants and Nurse Practitioners) who all work together to provide you with the care you need, when you need it.  We recommend signing up for the patient portal called "MyChart".  Sign up information is provided on this After Visit Summary.  MyChart is used to connect with patients for Virtual Visits (Telemedicine).  Patients are able to view lab/test results, encounter notes, upcoming appointments, etc.  Non-urgent messages  can be sent to your provider as well.   To learn more about what you can do with MyChart, go to [email or web address].    Your next appointment:   8 week(s)  Provider:   You may see Debbe Odea, MD or one of the following Advanced Practice Providers on your designated Care Team:   Nicolasa Ducking, NP Eula Listen, PA-C Cadence Fransico Michael, PA-C Charlsie Quest, NP Carlos Levering, NP   Signed, Debbe Odea, MD  06/05/2023 12:33 PM    [facility]

## 2023-06-05 NOTE — Patient Instructions (Signed)
Medication Instructions:   DECREASE Metoprolol Succinate - Take one tablet ( 25mg ) by mouth daily.   *If you need a refill on your cardiac medications before your next appointment, please call your pharmacy*   Lab Work:  None Ordered  If you have labs (blood work) drawn today and your tests are completely normal, you will receive your results only by: MyChart Message (if you have MyChart) OR A paper copy in the mail If you have any lab test that is abnormal or we need to change your treatment, we will call you to review the results.   Testing/Procedures:  Your physician has requested that you have an echocardiogram. Echocardiography is a painless test that uses sound waves to create images of your heart. It provides your doctor with information about the size and shape of your heart and how well your heart's chambers and valves are working. This procedure takes approximately one hour. There are no restrictions for this procedure. Please do NOT wear cologne, perfume, aftershave, or lotions (deodorant is allowed). Please arrive 15 minutes prior to your appointment time.  Please note: We ask at that you not bring children with you during ultrasound (echo/ vascular) testing. Due to room size and safety concerns, children are not allowed in the ultrasound rooms during exams. Our front office staff cannot provide observation of children in our lobby area while testing is being conducted. An adult accompanying a patient to their appointment will only be allowed in the ultrasound room at the discretion of the ultrasound technician under special circumstances. We apologize for any inconvenience.   Your physician has recommended that you wear a Zio monitor.   This monitor is a medical device that records the heart's electrical activity. Doctors most often use these monitors to diagnose arrhythmias. Arrhythmias are problems with the speed or rhythm of the heartbeat. The monitor is a small device  applied to your chest. You can wear one while you do your normal daily activities. While wearing this monitor if you have any symptoms to push the button and record what you felt. Once you have worn this monitor for the period of time provider prescribed (Usually 14 days), you will return the monitor device in the postage paid box. Once it is returned they will download the data collected and provide [place] with a report which the provider will then review and we will call you with those results. Important tips:  Avoid showering during the first 24 hours of wearing the monitor. Avoid excessive sweating to help maximize wear time. Do not submerge the device, no hot tubs, and no swimming pools. Keep any lotions or oils away from the patch. After 24 hours you may shower with the patch on. Take brief showers with your back facing the shower head.  Do not remove patch once it has been placed because that will interrupt data and decrease adhesive wear time. Push the button when you have any symptoms and write down what you were feeling. Once you have completed wearing your monitor, remove and place into box which has postage paid and place in your outgoing mailbox.  If for some reason you have misplaced your box then call our office and we can provide another box and/or mail it off for you.   Follow-Up: At [facility], you and your health needs are our priority.  As part of our continuing mission to provide you with exceptional heart care, we have created designated Provider Care Teams.  These Care Teams  include your primary Cardiologist (physician) and Advanced Practice Providers (APPs -  Physician Assistants and Nurse Practitioners) who all work together to provide you with the care you need, when you need it.  We recommend signing up for the patient portal called "MyChart".  Sign up information is provided on this After Visit Summary.  MyChart is used to connect with patients for Virtual Visits  (Telemedicine).  Patients are able to view lab/test results, encounter notes, upcoming appointments, etc.  Non-urgent messages can be sent to your provider as well.   To learn more about what you can do with MyChart, go to [email or web address].    Your next appointment:   8 week(s)  Provider:   You may see Debbe Odea, MD or one of the following Advanced Practice Providers on your designated Care Team:   Nicolasa Ducking, NP Eula Listen, PA-C Cadence Fransico Michael, PA-C Charlsie Quest, NP Carlos Levering, NP

## 2023-06-09 ENCOUNTER — Inpatient Hospital Stay: Payer: Medicare Other | Attending: Oncology

## 2023-06-09 ENCOUNTER — Inpatient Hospital Stay: Payer: Medicare Other

## 2023-06-09 ENCOUNTER — Inpatient Hospital Stay (HOSPITAL_BASED_OUTPATIENT_CLINIC_OR_DEPARTMENT_OTHER): Payer: Medicare Other | Admitting: Oncology

## 2023-06-09 ENCOUNTER — Encounter: Payer: Self-pay | Admitting: Oncology

## 2023-06-09 ENCOUNTER — Encounter: Payer: Self-pay | Admitting: Cardiology

## 2023-06-09 VITALS — BP 161/66 | HR 68 | Temp 97.4°F | Resp 18 | Wt 146.7 lb

## 2023-06-09 VITALS — BP 153/65 | HR 73 | Resp 16

## 2023-06-09 DIAGNOSIS — I130 Hypertensive heart and chronic kidney disease with heart failure and stage 1 through stage 4 chronic kidney disease, or unspecified chronic kidney disease: Secondary | ICD-10-CM | POA: Insufficient documentation

## 2023-06-09 DIAGNOSIS — E7800 Pure hypercholesterolemia, unspecified: Secondary | ICD-10-CM | POA: Diagnosis not present

## 2023-06-09 DIAGNOSIS — Z8249 Family history of ischemic heart disease and other diseases of the circulatory system: Secondary | ICD-10-CM | POA: Insufficient documentation

## 2023-06-09 DIAGNOSIS — Z803 Family history of malignant neoplasm of breast: Secondary | ICD-10-CM | POA: Insufficient documentation

## 2023-06-09 DIAGNOSIS — C9000 Multiple myeloma not having achieved remission: Secondary | ICD-10-CM | POA: Diagnosis not present

## 2023-06-09 DIAGNOSIS — M899 Disorder of bone, unspecified: Secondary | ICD-10-CM | POA: Diagnosis not present

## 2023-06-09 DIAGNOSIS — Z79620 Long term (current) use of immunosuppressive biologic: Secondary | ICD-10-CM | POA: Insufficient documentation

## 2023-06-09 DIAGNOSIS — C9001 Multiple myeloma in remission: Secondary | ICD-10-CM | POA: Diagnosis not present

## 2023-06-09 DIAGNOSIS — R5383 Other fatigue: Secondary | ICD-10-CM | POA: Diagnosis not present

## 2023-06-09 DIAGNOSIS — E041 Nontoxic single thyroid nodule: Secondary | ICD-10-CM | POA: Insufficient documentation

## 2023-06-09 DIAGNOSIS — Z823 Family history of stroke: Secondary | ICD-10-CM | POA: Diagnosis not present

## 2023-06-09 DIAGNOSIS — Z5112 Encounter for antineoplastic immunotherapy: Secondary | ICD-10-CM | POA: Diagnosis not present

## 2023-06-09 DIAGNOSIS — Z8261 Family history of arthritis: Secondary | ICD-10-CM | POA: Insufficient documentation

## 2023-06-09 DIAGNOSIS — Z9089 Acquired absence of other organs: Secondary | ICD-10-CM | POA: Diagnosis not present

## 2023-06-09 DIAGNOSIS — Z5111 Encounter for antineoplastic chemotherapy: Secondary | ICD-10-CM

## 2023-06-09 DIAGNOSIS — D649 Anemia, unspecified: Secondary | ICD-10-CM

## 2023-06-09 DIAGNOSIS — I700 Atherosclerosis of aorta: Secondary | ICD-10-CM | POA: Insufficient documentation

## 2023-06-09 DIAGNOSIS — M549 Dorsalgia, unspecified: Secondary | ICD-10-CM | POA: Diagnosis not present

## 2023-06-09 DIAGNOSIS — Z79899 Other long term (current) drug therapy: Secondary | ICD-10-CM | POA: Diagnosis not present

## 2023-06-09 DIAGNOSIS — N1831 Chronic kidney disease, stage 3a: Secondary | ICD-10-CM | POA: Insufficient documentation

## 2023-06-09 LAB — COMPREHENSIVE METABOLIC PANEL
ALT: 15 U/L (ref 0–44)
AST: 21 U/L (ref 15–41)
Albumin: 3.8 g/dL (ref 3.5–5.0)
Alkaline Phosphatase: 39 U/L (ref 38–126)
Anion gap: 8 (ref 5–15)
BUN: 19 mg/dL (ref 8–23)
CO2: 25 mmol/L (ref 22–32)
Calcium: 9.3 mg/dL (ref 8.9–10.3)
Chloride: 109 mmol/L (ref 98–111)
Creatinine, Ser: 1.08 mg/dL — ABNORMAL HIGH (ref 0.44–1.00)
GFR, Estimated: 50 mL/min — ABNORMAL LOW (ref >60)
Glucose, Bld: 84 mg/dL (ref 70–99)
Potassium: 3.6 mmol/L (ref 3.5–5.1)
Sodium: 142 mmol/L (ref 135–145)
Total Bilirubin: 0.7 mg/dL (ref <1.2)
Total Protein: 6.4 g/dL — ABNORMAL LOW (ref 6.5–8.1)

## 2023-06-09 LAB — CBC WITH DIFFERENTIAL/PLATELET
Abs Immature Granulocytes: 0.02 10*3/uL (ref 0.00–0.07)
Basophils Absolute: 0 10*3/uL (ref 0.0–0.1)
Basophils Relative: 1 %
Eosinophils Absolute: 0.1 10*3/uL (ref 0.0–0.5)
Eosinophils Relative: 2 %
HCT: 31.6 % — ABNORMAL LOW (ref 36.0–46.0)
Hemoglobin: 10.3 g/dL — ABNORMAL LOW (ref 12.0–15.0)
Immature Granulocytes: 0 %
Lymphocytes Relative: 36 %
Lymphs Abs: 1.8 10*3/uL (ref 0.7–4.0)
MCH: 32.5 pg (ref 26.0–34.0)
MCHC: 32.6 g/dL (ref 30.0–36.0)
MCV: 99.7 fL (ref 80.0–100.0)
Monocytes Absolute: 0.4 10*3/uL (ref 0.1–1.0)
Monocytes Relative: 7 %
Neutro Abs: 2.8 10*3/uL (ref 1.7–7.7)
Neutrophils Relative %: 54 %
Platelets: 220 10*3/uL (ref 150–400)
RBC: 3.17 MIL/uL — ABNORMAL LOW (ref 3.87–5.11)
RDW: 12.5 % (ref 11.5–15.5)
WBC: 5.1 10*3/uL (ref 4.0–10.5)
nRBC: 0 % (ref 0.0–0.2)

## 2023-06-09 MED ORDER — DEXAMETHASONE 4 MG PO TABS: 20.0000 mg | ORAL_TABLET | ORAL | 1 refills | Status: DC

## 2023-06-09 MED ORDER — DARATUMUMAB-HYALURONIDASE-FIHJ 1800-30000 MG-UT/15ML ~~LOC~~ SOLN
1800.0000 mg | Freq: Once | SUBCUTANEOUS | Status: AC
  Administered 2023-06-09: 1800 mg via SUBCUTANEOUS
  Filled 2023-06-09: qty 15

## 2023-06-09 MED ORDER — ACETAMINOPHEN 325 MG PO TABS
650.0000 mg | ORAL_TABLET | Freq: Once | ORAL | Status: AC
  Administered 2023-06-09: 650 mg via ORAL
  Filled 2023-06-09: qty 2

## 2023-06-09 MED ORDER — DIPHENHYDRAMINE HCL 25 MG PO CAPS
50.0000 mg | ORAL_CAPSULE | Freq: Once | ORAL | Status: AC
  Administered 2023-06-09: 50 mg via ORAL
  Filled 2023-06-09: qty 2

## 2023-06-09 NOTE — Assessment & Plan Note (Signed)
 Stable. Continue monitor

## 2023-06-09 NOTE — Assessment & Plan Note (Signed)
Continue Q3 months Zometa- today  next due Jan  2025 Recommend calcium 1200 mg daily. Repeat PET scan whole body,  Patchy hypermetabolism throughout the spine with focal hypermetabolism in the right third rib and left sacrum, compatible with residual metabolically active multiple myeloma.

## 2023-06-09 NOTE — Assessment & Plan Note (Signed)
recommend patient to avoid nephrotoxin.  Encourage oral hydration. 

## 2023-06-09 NOTE — Assessment & Plan Note (Signed)
Multiple myeloma, IgA kappa, complex hyperploid gain of 1q, S/p Daratumumab Rd.   Revlimid D1-21,  07/23/22 bone marrow biopsy  3% plasma cell, no M protein.   Labs are reviewed and discussed with patient.  Lab Results  Component Value Date   MPROTEIN 0.1 (H) 05/12/2023   MPROTEIN Comment (A) 04/14/2023   MPROTEIN 0.1 (H) 03/17/2023   KAPLAMBRATIO 2.68 (H) 05/12/2023   KAPLAMBRATIO 2.68 (H) 04/14/2023   KAPLAMBRATIO 2.16 (H) 03/17/2023    M protein has slightly increased from nadir. increased light chain ratio.  Proceed with  Daratumumab monotherapy maintenance - she takes home supply of Dexamethasone prior to her Daratumumab.  On asprin 81mg  daily and Acyclovir 400mg  BID.

## 2023-06-09 NOTE — Assessment & Plan Note (Signed)
Chemotherapy plan as listed as above. 

## 2023-06-09 NOTE — Patient Instructions (Signed)
[facility] - A DEPT OF [facility]  Discharge Instructions: Thank you for choosing [facility] to provide your oncology and hematology care.  If you have a lab appointment with the Cancer Center, please go directly to the Cancer Center and check in at the registration area.  Wear comfortable clothing and clothing appropriate for easy access to any Portacath or PICC line.   We strive to give you quality time with your provider. You may need to reschedule your appointment if you arrive late (15 or more minutes).  Arriving late affects you and other patients whose appointments are after yours.  Also, if you miss three or more appointments without notifying the office, you may be dismissed from the clinic at the provider's discretion.      For prescription refill requests, have your pharmacy contact our office and allow 72 hours for refills to be completed.    Today you received the following chemotherapy and/or immunotherapy agents darzalex      To help prevent nausea and vomiting after your treatment, we encourage you to take your nausea medication as directed.  BELOW ARE SYMPTOMS THAT SHOULD BE REPORTED IMMEDIATELY: *FEVER GREATER THAN 100.4 F (38 C) OR HIGHER *CHILLS OR SWEATING *NAUSEA AND VOMITING THAT IS NOT CONTROLLED WITH YOUR NAUSEA MEDICATION *UNUSUAL SHORTNESS OF BREATH *UNUSUAL BRUISING OR BLEEDING *URINARY PROBLEMS (pain or burning when urinating, or frequent urination) *BOWEL PROBLEMS (unusual diarrhea, constipation, pain near the anus) TENDERNESS IN MOUTH AND THROAT WITH OR WITHOUT PRESENCE OF ULCERS (sore throat, sores in mouth, or a toothache) UNUSUAL RASH, SWELLING OR PAIN  UNUSUAL VAGINAL DISCHARGE OR ITCHING   Items with * indicate a potential emergency and should be followed up as soon as possible or go to the Emergency Department if any problems should occur.  Please show the CHEMOTHERAPY ALERT CARD or IMMUNOTHERAPY  ALERT CARD at check-in to the Emergency Department and triage nurse.  Should you have questions after your visit or need to cancel or reschedule your appointment, please contact [facility] - A DEPT OF [facility]  [phone] and follow the prompts.  Office hours are 8:00 a.m. to 4:30 p.m. Monday - Friday. Please note that voicemails left after 4:00 p.m. may not be returned until the following business day.  We are closed weekends and major holidays. You have access to a nurse at all times for urgent questions. Please call the main number to the clinic [phone] and follow the prompts.  For any non-urgent questions, you may also contact your provider using MyChart. We now offer e-Visits for anyone 53 and older to request care online for non-urgent symptoms. For details visit [email or web address].   Also download the MyChart app! Go to the app store, search "MyChart", open the app, select [place], and log in with your MyChart username and password.

## 2023-06-09 NOTE — Progress Notes (Signed)
Hematology/Oncology Progress note Telephone:([phone] Fax:([phone]) [phone]     CHIEF COMPLAINTS/REASON FOR VISIT:  Follow-up for multiple myeloma  ASSESSMENT & PLAN:   Cancer Staging  Multiple myeloma (HCC) Staging form: Plasma Cell Myeloma and Plasma Cell Disorders, AJCC 8th Edition - Clinical stage from 08/22/2021: RISS Stage II (Beta-2-microglobulin (mg/L): 4.1, Albumin (g/dL): 2.8, ISS: Stage II, High-risk cytogenetics: Absent, LDH: Normal) - Signed by Rickard Patience, MD on 09/06/2021   Multiple myeloma (HCC) Multiple myeloma, IgA kappa, complex hyperploid gain of 1q, S/p Daratumumab Rd.   Revlimid D1-21,  07/23/22 bone marrow biopsy  3% plasma cell, no M protein.   Labs are reviewed and discussed with patient.  Lab Results  Component Value Date   MPROTEIN 0.1 (H) 05/12/2023   MPROTEIN Comment (A) 04/14/2023   MPROTEIN 0.1 (H) 03/17/2023   KAPLAMBRATIO 2.68 (H) 05/12/2023   KAPLAMBRATIO 2.68 (H) 04/14/2023   KAPLAMBRATIO 2.16 (H) 03/17/2023    M protein has slightly increased from nadir. increased light chain ratio.  Proceed with  Daratumumab monotherapy maintenance - she takes home supply of Dexamethasone prior to her Daratumumab.  On asprin 81mg  daily and Acyclovir 400mg  BID.   Bone lesion Continue Q3 months Zometa- today  next due Jan  2025 Recommend calcium 1200 mg daily. Repeat PET scan whole body,  Patchy hypermetabolism throughout the spine with focal hypermetabolism in the right third rib and left sacrum, compatible with residual metabolically active multiple myeloma.  Normocytic anemia Stable Continue monitor.   Stage 3a chronic kidney disease (HCC) recommend patient to avoid nephrotoxin.  Encourage oral hydration.  Encounter for antineoplastic chemotherapy Chemotherapy plan as listed as above.  No orders of the defined types were placed in this encounter.    Follow up  Lab/MD/Dara in 4 weeks  All questions were answered. The patient knows to call the  clinic with any problems, questions or concerns.  Rickard Patience, MD, PhD [facility] Hemat[facility] Oncology 06/09/2023     HISTORY OF PRESENTING ILLNESS:   Stephanie Delacruz is a  89 y.o.  female presents for follow up of multiple myeloma.  Oncology History  Multiple myeloma (HCC)  08/22/2021 Initial Diagnosis   Multiple myeloma (HCC) IgA kappa Initial M protein 5.2, free kappa light chain ratio 89.2, Kappa/lambda ratio 13.31   08/22/2021 Cancer Staging   Staging form: Plasma Cell Myeloma and Plasma Cell Disorders, AJCC 8th Edition - Clinical stage from 08/22/2021: RISS Stage II (Beta-2-microglobulin (mg/L): 4.1, Albumin (g/dL): 2.8, ISS: Stage II, High-risk cytogenetics: Absent, LDH: Normal) - Signed by Rickard Patience, MD on 09/06/2021 Stage prefix: Initial diagnosis Beta 2 microglobulin range (mg/L): 3.5 to 5.49 Albumin range (g/dL): Less than 3.5 Cytogenetics: 1q addition   08/26/2021 - 09/01/2022 Chemotherapy   08/26/21 MYELOMA Daratumumab SQ q28d  09/06/2021 added Revlimid 10mg  x 14 days.  09/27/2021 starting cycle 2 Revlimid 10 mg 21 days on, 7 days off. Weekly Dexamethasone.    08/26/2021 Bone Marrow Biopsy   Bone marrow biopsy showed hypercellular marrow with plasma cell neoplasm. 67% plasma cells in the aspirate. Cytogenetics showed complex hyperplasia, gain of 1 q.    07/23/2022 Bone Marrow Biopsy   Bone marrow biopsy showed Hypercellular bone marrow for age with trilineage hematopoiesis and 3% plasma cells  Cytogenetics are normal.   04/20/2023 Imaging   PET scan showed  1. Patchy hypermetabolism throughout the spine with focal hypermetabolism in the right third rib and left sacrum, compatible with residual metabolically active multiple myeloma. 2. Hypermetabolic 5 mm right  cervical lymph node, indeterminate.Recommend attention on follow-up. 3. 3.6 cm thyroid nodule, previously evaluated by ultrasound 08/27/2022 and biopsy 10/07/2022. Please refer to pathology report for further evaluation. No  follow-up recommended unless clinically warranted. (Ref: J Am Coll Radiol. 2015 Feb;12(2): 143-50). 4. Aortic atherosclerosis (ICD10-I70.0). Coronary artery calcification.    S/p thyroid lesion biopsy, obtained by endocrinologist. + AUS INTERVAL HISTORY [name] is a 89 y.o. female who has above history reviewed by me today presents for follow up visit for management of multiple myeloma She was accompanied by her daughter today.  She denies any nausea vomiting diarrhea.   No new complaints.   Review of Systems  Constitutional:  Positive for fatigue. Negative for appetite change, chills and fever.  HENT:   Negative for hearing loss and voice change.   Eyes:  Negative for eye problems.  Respiratory:  Negative for chest tightness and cough.   Cardiovascular:  Negative for chest pain.  Gastrointestinal:  Negative for abdominal distention, abdominal pain and blood in stool.  Endocrine: Negative for hot flashes.  Genitourinary:  Negative for difficulty urinating and frequency.   Musculoskeletal:  Positive for back pain. Negative for arthralgias.  Skin:  Negative for itching and rash.  Neurological:  Negative for extremity weakness.  Hematological:  Negative for adenopathy.  Psychiatric/Behavioral:  Negative for confusion.     MEDICAL HISTORY:  Past Medical History:  Diagnosis Date   Anemia    GERD (gastroesophageal reflux disease)    Hypercholesterolemia    Hypertension    Renal insufficiency     SURGICAL HISTORY: Past Surgical History:  Procedure Laterality Date   TONSILLECTOMY     TUBAL LIGATION      SOCIAL HISTORY: Social History   Socioeconomic History   Marital status: Married    Spouse name: Not on file   Number of children: Not on file   Years of education: Not on file   Highest education level: Not on file  Occupational History   Not on file  Tobacco Use   Smoking status: Never   Smokeless tobacco: Never  Vaping Use   Vaping status: Never Used   Substance and Sexual Activity   Alcohol use: No   Drug use: No   Sexual activity: Not on file  Other Topics Concern   Not on file  Social History Narrative   Lives with husband Fayrene Fearing and daughter Marylene Land. No pets.    Social Determinants of Health   Financial Resource Strain: Not on file  Food Insecurity: Not on file  Transportation Needs: Not on file  Physical Activity: Not on file  Stress: Not on file  Social Connections: Not on file  Intimate Partner Violence: Not on file    FAMILY HISTORY: Family History  Problem Relation Age of Onset   Hypertension Mother    Arthritis Mother    Hypertension Father    Stroke Father    Breast cancer Niece     ALLERGIES:  has No Known Allergies.  MEDICATIONS:  Current Outpatient Medications  Medication Sig Dispense Refill   acyclovir (ZOVIRAX) 400 MG tablet TAKE 1 TABLET(400 MG) BY MOUTH TWICE DAILY 60 tablet 11   amLODipine (NORVASC) 2.5 MG tablet Take 2 tablets (5 mg total) by mouth daily. 180 tablet 2   ascorbic acid (VITAMIN C) 250 MG tablet daily Take 2 tablets every day by oral route.     aspirin 81 MG EC tablet Take 81 mg by mouth daily.     Calcium Carb-Cholecalciferol (  CALCIUM 600+D) 600-20 MG-MCG TABS Take 1 tablet by mouth 3 (three) times daily.     donepezil (ARICEPT) 10 MG tablet TAKE 1 TABLET(10 MG) BY MOUTH DAILY (Patient taking differently: Take 5 mg by mouth at bedtime.) 90 tablet 2   fenofibrate (TRICOR) 48 MG tablet TAKE 1 TABLET(48 MG) BY MOUTH DAILY 90 tablet 2   fexofenadine (ALLEGRA) 180 MG tablet Take by mouth.     hydrochlorothiazide (HYDRODIURIL) 25 MG tablet TAKE 1/2 TABLET BY MOUTH EVERY DAY 45 tablet 1   memantine (NAMENDA) 5 MG tablet Take 5 mg by mouth 2 (two) times daily.     methimazole (TAPAZOLE) 5 MG tablet TAKE 1 TABLET(5 MG) BY MOUTH DAILY 90 tablet 3   metoprolol succinate (TOPROL-XL) 25 MG 24 hr tablet Take 1 tablet (25 mg total) by mouth daily. 90 tablet 0   montelukast (SINGULAIR) 10 MG tablet  Take 1 tablet (10 mg total) by mouth See admin instructions. Take 1 tablet one day before Daratumumab injection. 60 tablet 0   Multiple Vitamins-Minerals (WOMENS MULTIVITAMIN PO) Take 1 tablet by mouth daily.     omeprazole (PRILOSEC) 20 MG capsule TAKE 1 CAPSULE(20 MG) BY MOUTH DAILY 90 capsule 3   potassium chloride (KLOR-CON M) 10 MEQ tablet Take 1 tablet (10 mEq total) by mouth daily.     prochlorperazine (COMPAZINE) 10 MG tablet Take 10 mg by mouth every 6 (six) hours as needed for nausea or vomiting.     valsartan (DIOVAN) 160 MG tablet TAKE 1 TABLET(160 MG) BY MOUTH DAILY 90 tablet 1   dexamethasone (DECADRON) 4 MG tablet Take 5 tablets (20 mg total) by mouth See admin instructions. Take 20mg  prior to each Daratumumab treatment. 30 tablet 1   No current facility-administered medications for this visit.     PHYSICAL EXAMINATION: ECOG PERFORMANCE STATUS: 1 - Symptomatic but completely ambulatory Vitals:   06/09/23 0925 06/09/23 0933  BP: (!) 158/68 (!) 161/66  Pulse: 68   Resp: 18   Temp: (!) 97.4 F (36.3 C)   SpO2: 100%    Filed Weights   06/09/23 0925  Weight: 146 lb 11.2 oz (66.5 kg)    Physical Exam Constitutional:      General: She is not in acute distress. HENT:     Head: Normocephalic and atraumatic.  Eyes:     General: No scleral icterus. Cardiovascular:     Rate and Rhythm: Normal rate and regular rhythm.     Heart sounds: Normal heart sounds.  Pulmonary:     Effort: Pulmonary effort is normal. No respiratory distress.     Breath sounds: No wheezing.  Abdominal:     General: Bowel sounds are normal. There is no distension.     Palpations: Abdomen is soft.  Musculoskeletal:        General: No deformity. Normal range of motion.     Cervical back: Normal range of motion and neck supple.  Skin:    General: Skin is warm and dry.     Findings: No erythema or rash.  Neurological:     Mental Status: She is alert and oriented to person, place, and time. Mental  status is at baseline.     Cranial Nerves: No cranial nerve deficit.     Coordination: Coordination normal.  Psychiatric:        Mood and Affect: Mood normal.     LABORATORY DATA:  I have reviewed the data as listed    Latest Ref Rng & Units  06/09/2023    8:22 AM 05/12/2023    8:50 AM 04/14/2023    8:37 AM  CBC  WBC 4.0 - 10.5 K/uL 5.1  5.2  5.5   Hemoglobin 12.0 - 15.0 g/dL 16.1  09.6  04.5   Hematocrit 36.0 - 46.0 % 31.6  32.0  34.0   Platelets 150 - 400 K/uL 220  204  215       Latest Ref Rng & Units 06/09/2023    8:22 AM 05/12/2023    8:50 AM 04/14/2023    8:37 AM  CMP  Glucose 70 - 99 mg/dL 84  89  92   BUN 8 - 23 mg/dL 19  24  16    Creatinine 0.44 - 1.00 mg/dL 4.09  8.11  9.14   Sodium 135 - 145 mmol/L 142  140  137   Potassium 3.5 - 5.1 mmol/L 3.6  3.4  3.8   Chloride 98 - 111 mmol/L 109  109  107   CO2 22 - 32 mmol/L 25  24  23    Calcium 8.9 - 10.3 mg/dL 9.3  9.7  9.3   Total Protein 6.5 - 8.1 g/dL 6.4  6.8  7.0   Total Bilirubin <1.2 mg/dL 0.7  0.4  0.4   Alkaline Phos 38 - 126 U/L 39  41  44   AST 15 - 41 U/L 21  25  23    ALT 0 - 44 U/L 15  16  16       Iron/TIBC/Ferritin/ %Sat    Component Value Date/Time   IRON 54 07/24/2021 1212   TIBC 277 07/24/2021 1212   FERRITIN 93 07/24/2021 1212   IRONPCTSAT 20 07/24/2021 1212       RADIOGRAPHIC STUDIES: I have personally reviewed the radiological images as listed and agreed with the findings in the report. NM PET Image Restage (PS) Whole Body  Result Date: 04/20/2023 CLINICAL DATA:  Subsequent treatment strategy for multiple myeloma. EXAM: NUCLEAR MEDICINE PET WHOLE BODY TECHNIQUE: 7.9 mCi F-18 FDG was injected intravenously. Full-ring PET imaging was performed from the head to foot after the radiotracer. CT data was obtained and used for attenuation correction and anatomic localization. Fasting blood glucose: 80 mg/dl COMPARISON:  78/29/5621. FINDINGS: Mediastinal blood pool activity: SUV max 3.2 HEAD/NECK:  Right level II/III lymph node measures 5 mm (6/47), SUV max 4.9. No additional abnormal hypermetabolism. Incidental CT findings: None. CHEST: No abnormal hypermetabolism. Incidental CT findings: Thyroid nodule in the isthmus and left lobe measures 2.6 x 3.6 cm. Atherosclerotic calcification of the aorta, aortic valve and coronary arteries. Heart is enlarged. No pericardial or pleural effusion. ABDOMEN/PELVIS: No abnormal hypermetabolism. Incidental CT findings: Liver, gallbladder, adrenal glands, kidneys, spleen, pancreas, stomach and bowel are grossly unremarkable. SKELETON: Patchy hypermetabolism throughout the spine. Focal hypermetabolism in the lateral aspect the right third rib, SUV max 3.6. Focal hypermetabolism in the left sacrum, SUV max 3.2. Incidental CT findings: Degenerative changes in the spine. EXTREMITIES: Possible degenerative uptake in the left foot. Otherwise, no abnormal hypermetabolism. Incidental CT findings: None. IMPRESSION: 1. Patchy hypermetabolism throughout the spine with focal hypermetabolism in the right third rib and left sacrum, compatible with residual metabolically active multiple myeloma. 2. Hypermetabolic 5 mm right cervical lymph node, indeterminate. Recommend attention on follow-up. 3. 3.6 cm thyroid nodule, previously evaluated by ultrasound 08/27/2022 and biopsy 10/07/2022. Please refer to pathology report for further evaluation. No follow-up recommended unless clinically warranted. (Ref: J Am Coll Radiol. 2015 Feb;12(2): 143-50). 4. Aortic atherosclerosis (ICD10-I70.0).  Coronary artery calcification. Electronically Signed   By: Leanna Battles M.D.   On: 04/20/2023 10:24

## 2023-06-10 ENCOUNTER — Other Ambulatory Visit: Payer: Self-pay

## 2023-06-10 DIAGNOSIS — R55 Syncope and collapse: Secondary | ICD-10-CM | POA: Diagnosis not present

## 2023-06-10 LAB — KAPPA/LAMBDA LIGHT CHAINS
Kappa free light chain: 9.1 mg/L (ref 3.3–19.4)
Kappa, lambda light chain ratio: 2.68 — ABNORMAL HIGH (ref 0.26–1.65)
Lambda free light chains: 3.4 mg/L — ABNORMAL LOW (ref 5.7–26.3)

## 2023-06-10 MED ORDER — METOPROLOL SUCCINATE ER 25 MG PO TB24: 25.0000 mg | ORAL_TABLET | Freq: Every day | ORAL | 3 refills | Status: DC

## 2023-06-14 LAB — MULTIPLE MYELOMA PANEL, SERUM
Albumin SerPl Elph-Mcnc: 3.6 g/dL (ref 2.9–4.4)
Albumin/Glob SerPl: 1.8 — ABNORMAL HIGH (ref 0.7–1.7)
Alpha 1: 0.2 g/dL (ref 0.0–0.4)
Alpha2 Glob SerPl Elph-Mcnc: 0.6 g/dL (ref 0.4–1.0)
B-Globulin SerPl Elph-Mcnc: 1 g/dL (ref 0.7–1.3)
Gamma Glob SerPl Elph-Mcnc: 0.3 g/dL — ABNORMAL LOW (ref 0.4–1.8)
Globulin, Total: 2.1 g/dL — ABNORMAL LOW (ref 2.2–3.9)
IgA: 181 mg/dL (ref 64–422)
IgG (Immunoglobin G), Serum: 500 mg/dL — ABNORMAL LOW (ref 586–1602)
IgM (Immunoglobulin M), Srm: 17 mg/dL — ABNORMAL LOW (ref 26–217)
M Protein SerPl Elph-Mcnc: 0.1 g/dL — ABNORMAL HIGH
Total Protein ELP: 5.7 g/dL — ABNORMAL LOW (ref 6.0–8.5)

## 2023-06-15 ENCOUNTER — Other Ambulatory Visit: Payer: Self-pay | Admitting: Cardiology

## 2023-06-15 DIAGNOSIS — I10 Essential (primary) hypertension: Secondary | ICD-10-CM

## 2023-06-15 DIAGNOSIS — R001 Bradycardia, unspecified: Secondary | ICD-10-CM

## 2023-06-15 DIAGNOSIS — R55 Syncope and collapse: Secondary | ICD-10-CM

## 2023-06-29 ENCOUNTER — Ambulatory Visit: Payer: Medicare Other | Attending: Cardiology

## 2023-06-29 DIAGNOSIS — R55 Syncope and collapse: Secondary | ICD-10-CM | POA: Diagnosis not present

## 2023-06-30 LAB — ECHOCARDIOGRAM COMPLETE
Area-P 1/2: 3.48 cm2
S' Lateral: 1.8 cm

## 2023-07-01 ENCOUNTER — Encounter: Payer: Self-pay | Admitting: Internal Medicine

## 2023-07-01 ENCOUNTER — Ambulatory Visit: Payer: Medicare Other | Admitting: Internal Medicine

## 2023-07-01 VITALS — BP 126/82 | HR 73 | Ht 64.0 in | Wt 148.0 lb

## 2023-07-01 DIAGNOSIS — E042 Nontoxic multinodular goiter: Secondary | ICD-10-CM

## 2023-07-01 DIAGNOSIS — E0590 Thyrotoxicosis, unspecified without thyrotoxic crisis or storm: Secondary | ICD-10-CM

## 2023-07-01 DIAGNOSIS — R55 Syncope and collapse: Secondary | ICD-10-CM | POA: Diagnosis not present

## 2023-07-01 NOTE — Progress Notes (Unsigned)
Name: Stephanie Delacruz  MRN/ DOB: [id number], [birth year]    Age/ Sex: 89 y.o., female    PCP: Dale , MD   Reason for Endocrinology Evaluation: Multinodular goiter     Date of Initial Endocrinology Evaluation: 09/16/2022    HPI: Stephanie Delacruz is a 89 y.o. female with a past medical history of multiple myeloma, HTN, and dyslipidemia. The patient presented for initial endocrinology clinic visit on 09/16/2022 for consultative assistance with her multinodular goiter  Pt has been noted with multinodular goiter on ultrasound 07/2022 with two nodules meeting FNA criteria.  Patient on chemotherapy   TRAb undetectable  She is s/p FNA of the isthmic nodule with cytology report consistent with atypia of uncertain significance (Bethesda category III) on 10/07/2022, ThyroSeq was reported as negative but limited  She is also s/p benign FNA of left mid thyroid nodule, Bethesda category II  No family history of thyroid disease  SUBJECTIVE:    Today (07/01/23): Stephanie Delacruz is here for follow-up on multinodular goiter   Patient is accompanied by her daughter Marylene Land She continues to follow-up with oncology for the treatment of multiple myeloma, she is on maintenance therapy with daratumumab  Weight stable No local neck symptoms , denies dysphagia  Denies palpitations  Denies constipation nor diarrhea  Minimal tremors - chronic in nature    No biotin intake , except for MVI   Methimazole 5 mg daily      HISTORY:  Past Medical History:  Past Medical History:  Diagnosis Date   Anemia    GERD (gastroesophageal reflux disease)    Hypercholesterolemia    Hypertension    Renal insufficiency    Past Surgical History:  Past Surgical History:  Procedure Laterality Date   TONSILLECTOMY     TUBAL LIGATION      Social History:  reports that she has never smoked. She has never used smokeless tobacco. She reports that she does not drink alcohol and does not use drugs. Family  History: family history includes Arthritis in her mother; Breast cancer in her niece; Hypertension in her father and mother; Stroke in her father.   HOME MEDICATIONS: Allergies as of 07/01/2023   No Known Allergies      Medication List        Accurate as of July 01, 2023 10:51 AM. If you have any questions, ask your nurse or doctor.          acyclovir 400 MG tablet Commonly known as: ZOVIRAX TAKE 1 TABLET(400 MG) BY MOUTH TWICE DAILY   amLODipine 2.5 MG tablet Commonly known as: NORVASC Take 2 tablets (5 mg total) by mouth daily.   ascorbic acid 250 MG tablet Commonly known as: VITAMIN C daily Take 2 tablets every day by oral route.   aspirin EC 81 MG tablet Take 81 mg by mouth daily.   Calcium 600+D 600-20 MG-MCG Tabs Generic drug: Calcium Carb-Cholecalciferol Take 1 tablet by mouth 3 (three) times daily.   dexamethasone 4 MG tablet Commonly known as: DECADRON Take 5 tablets (20 mg total) by mouth See admin instructions. Take 20mg  prior to each Daratumumab treatment.   donepezil 10 MG tablet Commonly known as: ARICEPT TAKE 1 TABLET(10 MG) BY MOUTH DAILY What changed: See the new instructions.   fenofibrate 48 MG tablet Commonly known as: TRICOR TAKE 1 TABLET(48 MG) BY MOUTH DAILY   fexofenadine 180 MG tablet Commonly known as: ALLEGRA Take by mouth.   hydrochlorothiazide 25 MG tablet Commonly known  as: HYDRODIURIL TAKE 1/2 TABLET BY MOUTH EVERY DAY   memantine 5 MG tablet Commonly known as: NAMENDA Take 5 mg by mouth 2 (two) times daily.   methimazole 5 MG tablet Commonly known as: TAPAZOLE TAKE 1 TABLET(5 MG) BY MOUTH DAILY   metoprolol succinate 25 MG 24 hr tablet Commonly known as: TOPROL-XL Take 1 tablet (25 mg total) by mouth daily.   montelukast 10 MG tablet Commonly known as: Singulair Take 1 tablet (10 mg total) by mouth See admin instructions. Take 1 tablet one day before Daratumumab injection.   omeprazole 20 MG capsule Commonly  known as: PRILOSEC TAKE 1 CAPSULE(20 MG) BY MOUTH DAILY   potassium chloride 10 MEQ tablet Commonly known as: KLOR-CON M Take 1 tablet (10 mEq total) by mouth daily.   prochlorperazine 10 MG tablet Commonly known as: COMPAZINE Take 10 mg by mouth every 6 (six) hours as needed for nausea or vomiting.   valsartan 160 MG tablet Commonly known as: DIOVAN TAKE 1 TABLET(160 MG) BY MOUTH DAILY   WOMENS MULTIVITAMIN PO Take 1 tablet by mouth daily.          REVIEW OF SYSTEMS: A comprehensive ROS was conducted with the patient and is negative except as per HPI   OBJECTIVE:  VS: BP 126/82 (BP Location: Left Arm, Patient Position: Sitting, Cuff Size: Large)   Pulse 73   Ht 5\' 4"  (1.626 m)   Wt 148 lb (67.1 kg)   SpO2 99%   BMI 25.40 kg/m    Wt Readings from Last 3 Encounters:  07/01/23 148 lb (67.1 kg)  06/09/23 146 lb 11.2 oz (66.5 kg)  06/05/23 146 lb 6.4 oz (66.4 kg)     EXAM: General: Pt appears well and is in NAD  Neck: General: Supple without adenopathy. Thyroid: Isthmic nodule appreciated   Lungs: Clear with good BS bilat   Heart: Auscultation: RRR.  Abdomen:  soft, nontender  Extremities:  BL LE: No pretibial edema   Mental Status: Judgment, insight: Intact Orientation: Oriented to time, place, and person Mood and affect: No depression, anxiety, or agitation     DATA REVIEWED:   Latest Reference Range & Units 07/01/23 11:18  TSH 0.40 - 4.50 mIU/L 2.12  T4,Free(Direct) 0.8 - 1.8 ng/dL 1.1     Thyroid ultrasound 08/27/2022  Estimated total number of nodules >/= 1 cm: 3   Number of spongiform nodules >/=  2 cm not described below (TR1): 0   Number of mixed cystic and solid nodules >/= 1.5 cm not described below (TR2): 0   _________________________________________________________   Nodule # 1: Approximately 1.2 cm spongiform nodule in the thyroid isthmus is considered sonographically low risk/benign. No imaging follow-up recommended.   Nodule #  2:   Location: Isthmus; Inferior   Maximum size: 3.2 cm; Other 2 dimensions: 2.4 x 3.1 cm   Composition: solid/almost completely solid (2)   Echogenicity: isoechoic (1)   Shape: not taller-than-wide (0)   Margins: smooth (0)   Echogenic foci: none (0)   ACR TI-RADS total points: 3.   ACR TI-RADS risk category: TR3 (3 points).   ACR TI-RADS recommendations:   **Given size (>/= 2.5 cm) and appearance, fine needle aspiration of this mildly suspicious nodule should be considered based on TI-RADS criteria.   _________________________________________________________   Nodule # 3: Anechoic cyst with peripheral colloid artifact in the right mid gland measures up to 1.7 cm. Findings are consistent with a benign colloid nodule.   Nodule # 4:  Location: Left; Mid   Maximum size: 2.7 cm; Other 2 dimensions: 1.5 x 2.1 cm   Composition: solid/almost completely solid (2)   Echogenicity: isoechoic (1)   Shape: not taller-than-wide (0)   Margins: ill-defined (0)   Echogenic foci: none (0)   ACR TI-RADS total points: 3.   ACR TI-RADS risk category: TR3 (3 points).   ACR TI-RADS recommendations:   **Given size (>/= 2.5 cm) and appearance, fine needle aspiration of this mildly suspicious nodule should be considered based on TI-RADS criteria.   _________________________________________________________   IMPRESSION: 1. Enlarged, heterogeneous and multinodular thyroid gland most consistent with multinodular goiter. 2. Large TI-RADS category 3 nodules in the thyroid isthmus (labeled # 2) and the left mid gland (labeled # 4) both meet criteria to consider fine-needle aspiration biopsy.  FNA  left mid nodule 10/07/2022    DIAGNOSIS:  A. THYROID NODULE, LEFT MID; FNA:  - BENIGN (BETHESDA 2)  - FOLLICULAR EPITHELIAL CELLS, ABUNDANT COLLOID AND LYMPHOCYTES.       FNA left inferior isthmic  10/07/2022 Specimen Submitted:  A. Isthmus, inferior; FNA   DIAGNOSIS:  A.  THYROID, ISTHMUS (INFERIOR); FNA  - ATYPICAL FOLLICULAR CELLS OF UNCERTAIN SIGNIFICANCE (AUS) BETHESDA 3.  - BLOOD, HEMOSIDERIN LADEN HISTIOCYTES AND A FEW LYMPHOCYTES.   Thyroseq : negative    ASSESSMENT/PLAN/RECOMMENDATIONS:   Multinodular goiter  -No local neck symptoms -Most likely the reason for subclinical hyperthyroid  -She is s/p benign FNA of the left mid thyroid nodule as well as atypia of undetermined significance of the isthmic nodule with benign ThyroSeq 09/2022 -We will consider repeating thyroid ultrasound on the next visit    2. Hyperthyroidism:  -Most likely due to autonomous thyroid nodule -Tolerating methimazole without side effects -Labs today show normal TFTs, no change   Medication Continue methimazole 5 mg daily   Follow-up in 6 months  Signed electronically by: Lyndle Herrlich, MD  [facility] Endocrinology  [facility] [address] [place] [place], [place] [address] Phone: [phone] FAX: [phone]   CC: Dale Avon, MD [address] [place] [address] Phone: [phone] Fax: [phone]   Return to Endocrinology clinic as below: Future Appointments  Date Time Provider Department Center  07/01/2023 11:10 AM Jovana Rembold, Konrad Dolores, MD [facility] None  07/07/2023  1:15 PM [facility] None  07/07/2023  1:45 PM Rickard Patience, MD [facility] None  07/07/2023  2:00 PM [facility] INFUSION CHAIR 22 [facility] None  07/31/2023  1:00 PM Dale Woodford, MD [facility]  08/04/2023  1:15 PM [facility] None  08/04/2023  1:45 PM Rickard Patience, MD [facility] None  08/04/2023  2:00 PM [facility] INFUSION CHAIR 3 [facility] None  08/12/2023  8:40 AM Debbe Odea, MD [facility] None  12/10/2023 11:00 AM Georgiana Spinner, NP [facility] None

## 2023-07-02 ENCOUNTER — Other Ambulatory Visit: Payer: Self-pay

## 2023-07-02 LAB — TSH: TSH: 2.12 m[IU]/L (ref 0.40–4.50)

## 2023-07-02 LAB — T4, FREE: Free T4: 1.1 ng/dL (ref 0.8–1.8)

## 2023-07-02 MED ORDER — METHIMAZOLE 5 MG PO TABS: 5.0000 mg | ORAL_TABLET | Freq: Every day | ORAL | 3 refills | Status: DC

## 2023-07-07 ENCOUNTER — Inpatient Hospital Stay (HOSPITAL_BASED_OUTPATIENT_CLINIC_OR_DEPARTMENT_OTHER): Payer: Medicare Other | Admitting: Oncology

## 2023-07-07 ENCOUNTER — Encounter: Payer: Self-pay | Admitting: Oncology

## 2023-07-07 ENCOUNTER — Inpatient Hospital Stay: Payer: Medicare Other

## 2023-07-07 ENCOUNTER — Inpatient Hospital Stay: Payer: Medicare Other | Attending: Oncology

## 2023-07-07 VITALS — BP 145/66 | HR 72 | Temp 98.3°F | Resp 18 | Wt 148.8 lb

## 2023-07-07 VITALS — BP 147/65 | HR 70 | Resp 16

## 2023-07-07 DIAGNOSIS — M899 Disorder of bone, unspecified: Secondary | ICD-10-CM | POA: Diagnosis not present

## 2023-07-07 DIAGNOSIS — Z9089 Acquired absence of other organs: Secondary | ICD-10-CM | POA: Diagnosis not present

## 2023-07-07 DIAGNOSIS — Z823 Family history of stroke: Secondary | ICD-10-CM | POA: Diagnosis not present

## 2023-07-07 DIAGNOSIS — Z79620 Long term (current) use of immunosuppressive biologic: Secondary | ICD-10-CM | POA: Insufficient documentation

## 2023-07-07 DIAGNOSIS — Z79624 Long term (current) use of inhibitors of nucleotide synthesis: Secondary | ICD-10-CM | POA: Diagnosis not present

## 2023-07-07 DIAGNOSIS — Z79899 Other long term (current) drug therapy: Secondary | ICD-10-CM | POA: Diagnosis not present

## 2023-07-07 DIAGNOSIS — N1831 Chronic kidney disease, stage 3a: Secondary | ICD-10-CM | POA: Diagnosis not present

## 2023-07-07 DIAGNOSIS — Z8249 Family history of ischemic heart disease and other diseases of the circulatory system: Secondary | ICD-10-CM | POA: Diagnosis not present

## 2023-07-07 DIAGNOSIS — E7800 Pure hypercholesterolemia, unspecified: Secondary | ICD-10-CM | POA: Insufficient documentation

## 2023-07-07 DIAGNOSIS — Z803 Family history of malignant neoplasm of breast: Secondary | ICD-10-CM | POA: Diagnosis not present

## 2023-07-07 DIAGNOSIS — I2510 Atherosclerotic heart disease of native coronary artery without angina pectoris: Secondary | ICD-10-CM | POA: Insufficient documentation

## 2023-07-07 DIAGNOSIS — D649 Anemia, unspecified: Secondary | ICD-10-CM

## 2023-07-07 DIAGNOSIS — Z8261 Family history of arthritis: Secondary | ICD-10-CM | POA: Diagnosis not present

## 2023-07-07 DIAGNOSIS — Z5111 Encounter for antineoplastic chemotherapy: Secondary | ICD-10-CM

## 2023-07-07 DIAGNOSIS — I10 Essential (primary) hypertension: Secondary | ICD-10-CM | POA: Insufficient documentation

## 2023-07-07 DIAGNOSIS — I700 Atherosclerosis of aorta: Secondary | ICD-10-CM | POA: Diagnosis not present

## 2023-07-07 DIAGNOSIS — R5383 Other fatigue: Secondary | ICD-10-CM | POA: Insufficient documentation

## 2023-07-07 DIAGNOSIS — C9000 Multiple myeloma not having achieved remission: Secondary | ICD-10-CM

## 2023-07-07 DIAGNOSIS — C9001 Multiple myeloma in remission: Secondary | ICD-10-CM | POA: Diagnosis not present

## 2023-07-07 DIAGNOSIS — Z5112 Encounter for antineoplastic immunotherapy: Secondary | ICD-10-CM | POA: Insufficient documentation

## 2023-07-07 LAB — CBC WITH DIFFERENTIAL/PLATELET
Abs Immature Granulocytes: 0.02 10*3/uL (ref 0.00–0.07)
Basophils Absolute: 0 10*3/uL (ref 0.0–0.1)
Basophils Relative: 0 %
Eosinophils Absolute: 0 10*3/uL (ref 0.0–0.5)
Eosinophils Relative: 0 %
HCT: 31.7 % — ABNORMAL LOW (ref 36.0–46.0)
Hemoglobin: 10.4 g/dL — ABNORMAL LOW (ref 12.0–15.0)
Immature Granulocytes: 0 %
Lymphocytes Relative: 19 %
Lymphs Abs: 1.2 10*3/uL (ref 0.7–4.0)
MCH: 32.6 pg (ref 26.0–34.0)
MCHC: 32.8 g/dL (ref 30.0–36.0)
MCV: 99.4 fL (ref 80.0–100.0)
Monocytes Absolute: 0.1 10*3/uL (ref 0.1–1.0)
Monocytes Relative: 1 %
Neutro Abs: 4.9 10*3/uL (ref 1.7–7.7)
Neutrophils Relative %: 80 %
Platelets: 240 10*3/uL (ref 150–400)
RBC: 3.19 MIL/uL — ABNORMAL LOW (ref 3.87–5.11)
RDW: 12.5 % (ref 11.5–15.5)
WBC: 6.2 10*3/uL (ref 4.0–10.5)
nRBC: 0 % (ref 0.0–0.2)

## 2023-07-07 LAB — COMPREHENSIVE METABOLIC PANEL
ALT: 18 U/L (ref 0–44)
AST: 26 U/L (ref 15–41)
Albumin: 3.9 g/dL (ref 3.5–5.0)
Alkaline Phosphatase: 40 U/L (ref 38–126)
Anion gap: 9 (ref 5–15)
BUN: 22 mg/dL (ref 8–23)
CO2: 23 mmol/L (ref 22–32)
Calcium: 9.5 mg/dL (ref 8.9–10.3)
Chloride: 109 mmol/L (ref 98–111)
Creatinine, Ser: 0.96 mg/dL (ref 0.44–1.00)
GFR, Estimated: 57 mL/min — ABNORMAL LOW (ref >60)
Glucose, Bld: 117 mg/dL — ABNORMAL HIGH (ref 70–99)
Potassium: 4.1 mmol/L (ref 3.5–5.1)
Sodium: 141 mmol/L (ref 135–145)
Total Bilirubin: 0.6 mg/dL (ref <1.2)
Total Protein: 6.7 g/dL (ref 6.5–8.1)

## 2023-07-07 MED ORDER — DARATUMUMAB-HYALURONIDASE-FIHJ 1800-30000 MG-UT/15ML ~~LOC~~ SOLN
1800.0000 mg | Freq: Once | SUBCUTANEOUS | Status: AC
  Administered 2023-07-07: 1800 mg via SUBCUTANEOUS
  Filled 2023-07-07: qty 15

## 2023-07-07 MED ORDER — DIPHENHYDRAMINE HCL 25 MG PO CAPS
50.0000 mg | ORAL_CAPSULE | Freq: Once | ORAL | Status: AC
  Administered 2023-07-07: 50 mg via ORAL
  Filled 2023-07-07: qty 2

## 2023-07-07 MED ORDER — ACETAMINOPHEN 325 MG PO TABS
650.0000 mg | ORAL_TABLET | Freq: Once | ORAL | Status: AC
Start: 2023-07-07 — End: 2023-07-07
  Administered 2023-07-07: 650 mg via ORAL
  Filled 2023-07-07: qty 2

## 2023-07-07 NOTE — Assessment & Plan Note (Signed)
 Stable. Continue monitor

## 2023-07-07 NOTE — Assessment & Plan Note (Addendum)
Multiple myeloma, IgA kappa, complex hyperploid gain of 1q, S/p Daratumumab Rd.   Revlimid D1-21,  07/23/22 bone marrow biopsy  3% plasma cell, no M protein.   Labs are reviewed and discussed with patient.  Lab Results  Component Value Date   MPROTEIN 0.1 (H) 06/09/2023   KPAFRELGTCHN 9.1 06/09/2023   LAMBDASER 3.4 (L) 06/09/2023   KAPLAMBRATIO 2.68 (H) 06/09/2023       M protein has slightly increased from nadir. increased light chain ratio.  Proceed with  Daratumumab monotherapy maintenance - she takes home supply of Dexamethasone prior to her Daratumumab.  On asprin 81mg  daily and Acyclovir 400mg  BID.

## 2023-07-07 NOTE — Assessment & Plan Note (Signed)
recommend patient to avoid nephrotoxin.  Encourage oral hydration. 

## 2023-07-07 NOTE — Patient Instructions (Signed)
 [facility] - A DEPT OF [facility]  Discharge Instructions: Thank you for choosing [facility] to provide your oncology and hematology care.  If you have a lab appointment with the Cancer Center, please go directly to the Cancer Center and check in at the registration area.  Wear comfortable clothing and clothing appropriate for easy access to any Portacath or PICC line.   We strive to give you quality time with your provider. You may need to reschedule your appointment if you arrive late (15 or more minutes).  Arriving late affects you and other patients whose appointments are after yours.  Also, if you miss three or more appointments without notifying the office, you may be dismissed from the clinic at the provider's discretion.      For prescription refill requests, have your pharmacy contact our office and allow 72 hours for refills to be completed.    Today you received the following chemotherapy and/or immunotherapy agents darzalex    To help prevent nausea and vomiting after your treatment, we encourage you to take your nausea medication as directed.  BELOW ARE SYMPTOMS THAT SHOULD BE REPORTED IMMEDIATELY: *FEVER GREATER THAN 100.4 F (38 C) OR HIGHER *CHILLS OR SWEATING *NAUSEA AND VOMITING THAT IS NOT CONTROLLED WITH YOUR NAUSEA MEDICATION *UNUSUAL SHORTNESS OF BREATH *UNUSUAL BRUISING OR BLEEDING *URINARY PROBLEMS (pain or burning when urinating, or frequent urination) *BOWEL PROBLEMS (unusual diarrhea, constipation, pain near the anus) TENDERNESS IN MOUTH AND THROAT WITH OR WITHOUT PRESENCE OF ULCERS (sore throat, sores in mouth, or a toothache) UNUSUAL RASH, SWELLING OR PAIN  UNUSUAL VAGINAL DISCHARGE OR ITCHING   Items with * indicate a potential emergency and should be followed up as soon as possible or go to the Emergency Department if any problems should occur.  Please show the CHEMOTHERAPY ALERT CARD or IMMUNOTHERAPY ALERT  CARD at check-in to the Emergency Department and triage nurse.  Should you have questions after your visit or need to cancel or reschedule your appointment, please contact [facility] - A DEPT OF [facility]  [phone] and follow the prompts.  Office hours are 8:00 a.m. to 4:30 p.m. Monday - Friday. Please note that voicemails left after 4:00 p.m. may not be returned until the following business day.  We are closed weekends and major holidays. You have access to a nurse at all times for urgent questions. Please call the main number to the clinic [phone] and follow the prompts.  For any non-urgent questions, you may also contact your provider using MyChart. We now offer e-Visits for anyone 100 and older to request care online for non-urgent symptoms. For details visit [email or web address].   Also download the MyChart app! Go to the app store, search "MyChart", open the app, select , and log in with your MyChart username and password.

## 2023-07-07 NOTE — Progress Notes (Signed)
Hematology/Oncology Progress note [phone] [phone]     CHIEF COMPLAINTS/REASON FOR VISIT:  Follow-up for multiple myeloma  ASSESSMENT & PLAN:   Cancer Staging  Multiple myeloma (HCC) Staging form: Plasma Cell Myeloma and Plasma Cell Disorders, AJCC 8th Edition - Clinical stage from 08/22/2021: RISS Stage II (Beta-2-microglobulin (mg/L): 4.1, Albumin (g/dL): 2.8, ISS: Stage II, High-risk cytogenetics: Absent, LDH: Normal) - Signed by Rickard Patience, MD on 09/06/2021   Multiple myeloma (HCC) Multiple myeloma, IgA kappa, complex hyperploid gain of 1q, S/p Daratumumab Rd.   Revlimid D1-21,  07/23/22 bone marrow biopsy  3% plasma cell, no M protein.   Labs are reviewed and discussed with patient.  Lab Results  Component Value Date   MPROTEIN 0.1 (H) 06/09/2023   KPAFRELGTCHN 9.1 06/09/2023   LAMBDASER 3.4 (L) 06/09/2023   KAPLAMBRATIO 2.68 (H) 06/09/2023       M protein has slightly increased from nadir. increased light chain ratio.  Proceed with  Daratumumab monotherapy maintenance - she takes home supply of Dexamethasone prior to her Daratumumab.  On asprin 81mg  daily and Acyclovir 400mg  BID.   Encounter for antineoplastic chemotherapy Chemotherapy plan as listed as above.  Bone lesion 04/20/23 PET scan whole body,  Patchy hypermetabolism throughout the spine with focal hypermetabolism in the right third rib and left sacrum, compatible with residual metabolically active multiple myeloma.  Continue Q3 months Zometa- today  next due Jan  2025 Recommend calcium 1200 mg daily.  Normocytic anemia Stable Continue monitor.   Stage 3a chronic kidney disease (HCC) recommend patient to avoid nephrotoxin.  Encourage oral hydration.  No orders of the defined types were placed in this encounter.    Follow up  Lab/MD/Dara in 4 weeks  All questions were answered. The patient knows to call the clinic with any problems, questions or concerns.  Rickard Patience, MD,  PhD [facility] H[facility] Oncology 07/07/2023     HISTORY OF PRESENTING ILLNESS:   Stephanie Delacruz is a  89 y.o.  female presents for follow up of multiple myeloma.  Oncology History  Multiple myeloma (HCC)  08/22/2021 Initial Diagnosis   Multiple myeloma (HCC) IgA kappa Initial M protein 5.2, free kappa light chain ratio 89.2, Kappa/lambda ratio 13.31   08/22/2021 Cancer Staging   Staging form: Plasma Cell Myeloma and Plasma Cell Disorders, AJCC 8th Edition - Clinical stage from 08/22/2021: RISS Stage II (Beta-2-microglobulin (mg/L): 4.1, Albumin (g/dL): 2.8, ISS: Stage II, High-risk cytogenetics: Absent, LDH: Normal) - Signed by Rickard Patience, MD on 09/06/2021 Stage prefix: Initial diagnosis Beta 2 microglobulin range (mg/L): 3.5 to 5.49 Albumin range (g/dL): Less than 3.5 Cytogenetics: 1q addition   08/26/2021 - 09/01/2022 Chemotherapy   08/26/21 MYELOMA Daratumumab SQ q28d  09/06/2021 added Revlimid 10mg  x 14 days.  09/27/2021 starting cycle 2 Revlimid 10 mg 21 days on, 7 days off. Weekly Dexamethasone.    08/26/2021 Bone Marrow Biopsy   Bone marrow biopsy showed hypercellular marrow with plasma cell neoplasm. 67% plasma cells in the aspirate. Cytogenetics showed complex hyperplasia, gain of 1 q.    07/23/2022 Bone Marrow Biopsy   Bone marrow biopsy showed Hypercellular bone marrow for age with trilineage hematopoiesis and 3% plasma cells  Cytogenetics are normal.   04/20/2023 Imaging   PET scan showed  1. Patchy hypermetabolism throughout the spine with focal hypermetabolism in the right third rib and left sacrum, compatible with residual metabolically active multiple myeloma. 2. Hypermetabolic 5 mm right cervical lymph node, indeterminate.Recommend attention on follow-up. 3. 3.6  cm thyroid nodule, previously evaluated by ultrasound 08/27/2022 and biopsy 10/07/2022. Please refer to pathology report for further evaluation. No follow-up recommended unless clinically warranted. (Ref: J Am  Coll Radiol. 2015 Feb;12(2): 143-50). 4. Aortic atherosclerosis (ICD10-I70.0). Coronary artery calcification.    S/p thyroid lesion biopsy, obtained by endocrinologist. + AUS  INTERVAL HISTORY Stephanie Delacruz is a 89 y.o. female who has above history reviewed by me today presents for follow up visit for management of multiple myeloma She was accompanied by her daughter today.  She denies any nausea vomiting diarrhea.   No new complaints.   Review of Systems  Constitutional:  Positive for fatigue. Negative for appetite change, chills and fever.  HENT:   Negative for hearing loss and voice change.   Eyes:  Negative for eye problems.  Respiratory:  Negative for chest tightness and cough.   Cardiovascular:  Negative for chest pain.  Gastrointestinal:  Negative for abdominal distention, abdominal pain and blood in stool.  Endocrine: Negative for hot flashes.  Genitourinary:  Negative for difficulty urinating and frequency.   Musculoskeletal:  Positive for back pain. Negative for arthralgias.  Skin:  Negative for itching and rash.  Neurological:  Negative for extremity weakness.  Hematological:  Negative for adenopathy.  Psychiatric/Behavioral:  Negative for confusion.     MEDICAL HISTORY:  Past Medical History:  Diagnosis Date   Anemia    GERD (gastroesophageal reflux disease)    Hypercholesterolemia    Hypertension    Renal insufficiency     SURGICAL HISTORY: Past Surgical History:  Procedure Laterality Date   TONSILLECTOMY     TUBAL LIGATION      SOCIAL HISTORY: Social History   Socioeconomic History   Marital status: Married    Spouse name: Not on file   Number of children: Not on file   Years of education: Not on file   Highest education level: Not on file  Occupational History   Not on file  Tobacco Use   Smoking status: Never   Smokeless tobacco: Never  Vaping Use   Vaping status: Never Used  Substance and Sexual Activity   Alcohol use: No   Drug use: No    Sexual activity: Not on file  Other Topics Concern   Not on file  Social History Narrative   Lives with husband Fayrene Fearing and daughter Marylene Land. No pets.    Social Determinants of Health   Financial Resource Strain: Not on file  Food Insecurity: Not on file  Transportation Needs: Not on file  Physical Activity: Not on file  Stress: Not on file  Social Connections: Not on file  Intimate Partner Violence: Not on file    FAMILY HISTORY: Family History  Problem Relation Age of Onset   Hypertension Mother    Arthritis Mother    Hypertension Father    Stroke Father    Breast cancer Niece     ALLERGIES:  has No Known Allergies.  MEDICATIONS:  Current Outpatient Medications  Medication Sig Dispense Refill   acyclovir (ZOVIRAX) 400 MG tablet TAKE 1 TABLET(400 MG) BY MOUTH TWICE DAILY 60 tablet 11   amLODipine (NORVASC) 2.5 MG tablet Take 2 tablets (5 mg total) by mouth daily. 180 tablet 2   ascorbic acid (VITAMIN C) 250 MG tablet daily Take 2 tablets every day by oral route.     aspirin 81 MG EC tablet Take 81 mg by mouth daily.     Calcium Carb-Cholecalciferol (CALCIUM 600+D) 600-20 MG-MCG TABS Take 1 tablet  by mouth 3 (three) times daily.     dexamethasone (DECADRON) 4 MG tablet Take 5 tablets (20 mg total) by mouth See admin instructions. Take 20mg  prior to each Daratumumab treatment. 30 tablet 1   donepezil (ARICEPT) 10 MG tablet TAKE 1 TABLET(10 MG) BY MOUTH DAILY (Patient taking differently: Take 5 mg by mouth at bedtime.) 90 tablet 2   fenofibrate (TRICOR) 48 MG tablet TAKE 1 TABLET(48 MG) BY MOUTH DAILY 90 tablet 2   fexofenadine (ALLEGRA) 180 MG tablet Take by mouth.     hydrochlorothiazide (HYDRODIURIL) 25 MG tablet TAKE 1/2 TABLET BY MOUTH EVERY DAY 45 tablet 1   memantine (NAMENDA) 5 MG tablet Take 5 mg by mouth 2 (two) times daily.     methimazole (TAPAZOLE) 5 MG tablet Take 1 tablet (5 mg total) by mouth daily. 90 tablet 3   metoprolol succinate (TOPROL-XL) 25 MG 24 hr  tablet Take 1 tablet (25 mg total) by mouth daily. 90 tablet 3   montelukast (SINGULAIR) 10 MG tablet Take 1 tablet (10 mg total) by mouth See admin instructions. Take 1 tablet one day before Daratumumab injection. 60 tablet 0   Multiple Vitamins-Minerals (WOMENS MULTIVITAMIN PO) Take 1 tablet by mouth daily.     omeprazole (PRILOSEC) 20 MG capsule TAKE 1 CAPSULE(20 MG) BY MOUTH DAILY 90 capsule 3   prochlorperazine (COMPAZINE) 10 MG tablet Take 10 mg by mouth every 6 (six) hours as needed for nausea or vomiting.     valsartan (DIOVAN) 160 MG tablet TAKE 1 TABLET(160 MG) BY MOUTH DAILY 90 tablet 1   No current facility-administered medications for this visit.     PHYSICAL EXAMINATION: ECOG PERFORMANCE STATUS: 1 - Symptomatic but completely ambulatory Vitals:   07/07/23 1334  BP: (!) 145/66  Pulse: 72  Resp: 18  Temp: 98.3 F (36.8 C)  SpO2: 100%   Filed Weights   07/07/23 1334  Weight: 148 lb 12.8 oz (67.5 kg)    Physical Exam Constitutional:      General: She is not in acute distress. HENT:     Head: Normocephalic and atraumatic.  Eyes:     General: No scleral icterus. Cardiovascular:     Rate and Rhythm: Normal rate and regular rhythm.     Heart sounds: Normal heart sounds.  Pulmonary:     Effort: Pulmonary effort is normal. No respiratory distress.     Breath sounds: No wheezing.  Abdominal:     General: Bowel sounds are normal. There is no distension.     Palpations: Abdomen is soft.  Musculoskeletal:        General: No deformity. Normal range of motion.     Cervical back: Normal range of motion and neck supple.  Skin:    General: Skin is warm and dry.     Findings: No erythema or rash.  Neurological:     Mental Status: She is alert and oriented to person, place, and time. Mental status is at baseline.     Cranial Nerves: No cranial nerve deficit.     Coordination: Coordination normal.  Psychiatric:        Mood and Affect: Mood normal.     LABORATORY  DATA:  I have reviewed the data as listed    Latest Ref Rng & Units 07/07/2023    1:14 PM 06/09/2023    8:22 AM 05/12/2023    8:50 AM  CBC  WBC 4.0 - 10.5 K/uL 6.2  5.1  5.2   Hemoglobin 12.0 -  15.0 g/dL 40.9  81.1  91.4   Hematocrit 36.0 - 46.0 % 31.7  31.6  32.0   Platelets 150 - 400 K/uL 240  220  204       Latest Ref Rng & Units 07/07/2023    1:14 PM 06/09/2023    8:22 AM 05/12/2023    8:50 AM  CMP  Glucose 70 - 99 mg/dL 782  84  89   BUN 8 - 23 mg/dL 22  19  24    Creatinine 0.44 - 1.00 mg/dL 9.56  2.13  0.86   Sodium 135 - 145 mmol/L 141  142  140   Potassium 3.5 - 5.1 mmol/L 4.1  3.6  3.4   Chloride 98 - 111 mmol/L 109  109  109   CO2 22 - 32 mmol/L 23  25  24    Calcium 8.9 - 10.3 mg/dL 9.5  9.3  9.7   Total Protein 6.5 - 8.1 g/dL 6.7  6.4  6.8   Total Bilirubin <1.2 mg/dL 0.6  0.7  0.4   Alkaline Phos 38 - 126 U/L 40  39  41   AST 15 - 41 U/L 26  21  25    ALT 0 - 44 U/L 18  15  16       Iron/TIBC/Ferritin/ %Sat    Component Value Date/Time   IRON 54 07/24/2021 1212   TIBC 277 07/24/2021 1212   FERRITIN 93 07/24/2021 1212   IRONPCTSAT 20 07/24/2021 1212       RADIOGRAPHIC STUDIES: I have personally reviewed the radiological images as listed and agreed with the findings in the report. LONG TERM MONITOR (3-14 DAYS)  Result Date: 07/02/2023 Patch Wear Time:  13 days and 17 hours (2024-11-13T11:46:29-0500 to 2024-11-27T04:48:29-0500) Patient had a min HR of 52 bpm, max HR of 113 bpm, and avg HR of 67 bpm. Predominant underlying rhythm was Sinus Rhythm. Isolated SVEs were rare (<1.0%), SVE Couplets were rare (<1.0%), and SVE Triplets were rare (<1.0%). Isolated VEs were rare (<1.0%), and no VE Couplets or VE Triplets were present. Ventricular Trigeminy was present. Conclusion Cardiac monitor showed no arrhythmias. Minimal heart rate 52, max heart rate 113, average heart rate 67. Overall benign cardiac monitor.   ECHOCARDIOGRAM COMPLETE  Result Date: 06/30/2023     ECHOCARDIOGRAM REPORT   Patient Name:   Stephanie Delacruz Date of Exam: 06/29/2023 Medical Rec #:  [id number]    Height:       64.0 in Accession #:    [id number]   Weight:       146.7 lb Date of Birth:  [birth year]     BSA:          1.715 m Patient Age:    89 years     BP:           142/77 mmHg Patient Gender: F            HR:           61 bpm. Exam Location:  [place] Procedure: 2D Echo, 3D Echo, Cardiac Doppler, Color Doppler and Strain Analysis Indications:    R55 Syncope  History:        Patient has no prior history of Echocardiogram examinations.                 Stroke, Arrythmias:Bradycardia, Signs/Symptoms:Syncope and                 Murmur; Risk Factors:Hypertension and Non-Smoker.  Sonographer:  Ilda Mori MHA, BS, RDCS Referring Phys: [id number] BRIAN AGBOR-ETANG IMPRESSIONS  1. Left ventricular ejection fraction, by estimation, is 60 to 65%. The left ventricle has normal function. The left ventricle has no regional wall motion abnormalities. Left ventricular diastolic parameters are consistent with Grade I diastolic dysfunction (impaired relaxation). The average left ventricular global longitudinal strain is -21.2 %. The global longitudinal strain is normal.  2. Right ventricular systolic function is normal. The right ventricular size is normal. There is moderately elevated pulmonary artery systolic pressure. The estimated right ventricular systolic pressure is 51.8 mmHg.  3. Left atrial size was mild to moderately dilated.  4. The mitral valve is normal in structure. Mild to moderate mitral valve regurgitation.  5. The aortic valve is tricuspid. Aortic valve regurgitation is mild to moderate. Aortic valve sclerosis/calcification is present, without any evidence of aortic stenosis.  6. The inferior vena cava is normal in size with <50% respiratory variability, suggesting right atrial pressure of 8 mmHg. FINDINGS  Left Ventricle: Left ventricular ejection fraction, by estimation, is 60 to 65%. The left  ventricle has normal function. The left ventricle has no regional wall motion abnormalities. The average left ventricular global longitudinal strain is -21.2 %. The global longitudinal strain is normal. The left ventricular internal cavity size was normal in size. There is no left ventricular hypertrophy. Left ventricular diastolic parameters are consistent with Grade I diastolic dysfunction (impaired relaxation). Right Ventricle: The right ventricular size is normal. No increase in right ventricular wall thickness. Right ventricular systolic function is normal. There is moderately elevated pulmonary artery systolic pressure. The tricuspid regurgitant velocity is 3.31 m/s, and with an assumed right atrial pressure of 8 mmHg, the estimated right ventricular systolic pressure is 51.8 mmHg. Left Atrium: Left atrial size was mild to moderately dilated. Right Atrium: Right atrial size was normal in size. Pericardium: There is no evidence of pericardial effusion. Mitral Valve: The mitral valve is normal in structure. Mild to moderate mitral valve regurgitation. Tricuspid Valve: The tricuspid valve is normal in structure. Tricuspid valve regurgitation is mild. Aortic Valve: The aortic valve is tricuspid. Aortic valve regurgitation is mild to moderate. Aortic valve sclerosis/calcification is present, without any evidence of aortic stenosis. Pulmonic Valve: The pulmonic valve was normal in structure. Pulmonic valve regurgitation is mild to moderate. Aorta: The aortic root and ascending aorta are structurally normal, with no evidence of dilitation. Venous: The inferior vena cava is normal in size with less than 50% respiratory variability, suggesting right atrial pressure of 8 mmHg. IAS/Shunts: No atrial level shunt detected by color flow Doppler.  LEFT VENTRICLE PLAX 2D LVIDd:         3.20 cm Diastology LVIDs:         1.80 cm LV e' medial:    7.40 cm/s LV PW:         0.90 cm LV E/e' medial:  8.6 LV IVS:        0.90 cm LV e'  lateral:   12.00 cm/s                        LV E/e' lateral: 5.3                         2D Longitudinal Strain                        2D Strain GLS Avg:     -  21.2 %                         3D Volume EF:                        3D EF:        65 %                        LV EDV:       154 ml                        LV ESV:       53 ml                        LV SV:        100 ml RIGHT VENTRICLE             IVC RV Basal diam:  3.20 cm     IVC diam: 2.10 cm RV Mid diam:    2.90 cm RV S prime:     13.40 cm/s TAPSE (M-mode): 2.9 cm LEFT ATRIUM             Index        RIGHT ATRIUM           Index LA diam:        4.00 cm 2.33 cm/m   RA Area:     13.40 cm LA Vol (A2C):   75.9 ml 44.26 ml/m  RA Volume:   28.80 ml  16.79 ml/m LA Vol (A4C):   64.6 ml 37.67 ml/m LA Biplane Vol: 71.7 ml 41.81 ml/m   AORTA Ao Root diam: 2.90 cm Ao Asc diam:  2.80 cm MITRAL VALVE               TRICUSPID VALVE MV Area (PHT): 3.48 cm    TR Peak grad:   43.8 mmHg MV Decel Time: 218 msec    TR Vmax:        331.00 cm/s MV E velocity: 63.40 cm/s MV A velocity: 72.80 cm/s MV E/A ratio:  0.87 Debbe Odea MD Electronically signed by Debbe Odea MD Signature Date/Time: 06/30/2023/9:43:40 AM    Final

## 2023-07-07 NOTE — Progress Notes (Signed)
 Patient is doing well, no new questions or concerns for the doctor today.

## 2023-07-07 NOTE — Assessment & Plan Note (Signed)
Chemotherapy plan as listed as above. 

## 2023-07-07 NOTE — Assessment & Plan Note (Signed)
04/20/23 PET scan whole body,  Patchy hypermetabolism throughout the spine with focal hypermetabolism in the right third rib and left sacrum, compatible with residual metabolically active multiple myeloma.  Continue Q3 months Zometa- today  next due Jan  2025 Recommend calcium 1200 mg daily.

## 2023-07-08 LAB — KAPPA/LAMBDA LIGHT CHAINS
Kappa free light chain: 8.3 mg/L (ref 3.3–19.4)
Kappa, lambda light chain ratio: 2.37 — ABNORMAL HIGH (ref 0.26–1.65)
Lambda free light chains: 3.5 mg/L — ABNORMAL LOW (ref 5.7–26.3)

## 2023-07-13 LAB — MULTIPLE MYELOMA PANEL, SERUM
Albumin SerPl Elph-Mcnc: 3.6 g/dL (ref 2.9–4.4)
Albumin/Glob SerPl: 1.5 (ref 0.7–1.7)
Alpha 1: 0.2 g/dL (ref 0.0–0.4)
Alpha2 Glob SerPl Elph-Mcnc: 0.7 g/dL (ref 0.4–1.0)
B-Globulin SerPl Elph-Mcnc: 1.2 g/dL (ref 0.7–1.3)
Gamma Glob SerPl Elph-Mcnc: 0.4 g/dL (ref 0.4–1.8)
Globulin, Total: 2.5 g/dL (ref 2.2–3.9)
IgA: 176 mg/dL (ref 64–422)
IgG (Immunoglobin G), Serum: 548 mg/dL — ABNORMAL LOW (ref 586–1602)
IgM (Immunoglobulin M), Srm: 18 mg/dL — ABNORMAL LOW (ref 26–217)
M Protein SerPl Elph-Mcnc: 0.3 g/dL — ABNORMAL HIGH
Total Protein ELP: 6.1 g/dL (ref 6.0–8.5)

## 2023-07-31 ENCOUNTER — Ambulatory Visit (INDEPENDENT_AMBULATORY_CARE_PROVIDER_SITE_OTHER): Payer: Medicare Other | Admitting: Internal Medicine

## 2023-07-31 ENCOUNTER — Encounter: Payer: Self-pay | Admitting: Internal Medicine

## 2023-07-31 VITALS — BP 124/70 | HR 71 | Temp 98.0°F | Resp 16 | Ht 64.0 in | Wt 149.2 lb

## 2023-07-31 DIAGNOSIS — Z8659 Personal history of other mental and behavioral disorders: Secondary | ICD-10-CM

## 2023-07-31 DIAGNOSIS — E042 Nontoxic multinodular goiter: Secondary | ICD-10-CM

## 2023-07-31 DIAGNOSIS — E0789 Other specified disorders of thyroid: Secondary | ICD-10-CM | POA: Diagnosis not present

## 2023-07-31 DIAGNOSIS — F0390 Unspecified dementia without behavioral disturbance: Secondary | ICD-10-CM

## 2023-07-31 DIAGNOSIS — E7800 Pure hypercholesterolemia, unspecified: Secondary | ICD-10-CM

## 2023-07-31 DIAGNOSIS — R55 Syncope and collapse: Secondary | ICD-10-CM

## 2023-07-31 DIAGNOSIS — I10 Essential (primary) hypertension: Secondary | ICD-10-CM

## 2023-07-31 DIAGNOSIS — Z0000 Encounter for general adult medical examination without abnormal findings: Secondary | ICD-10-CM

## 2023-07-31 DIAGNOSIS — N1831 Chronic kidney disease, stage 3a: Secondary | ICD-10-CM

## 2023-07-31 DIAGNOSIS — C9001 Multiple myeloma in remission: Secondary | ICD-10-CM

## 2023-07-31 NOTE — Progress Notes (Signed)
 Subjective:    Patient ID: Stephanie Delacruz, female    DOB: February 20, 1934, 89 y.o.   MRN: [id number]  Patient here for No chief complaint on file.   HPI Here for a physical exam. She is accompanied by her daughter.  History obtained from both of them. Followed by Dr Babara for multiple myeloma.  Last seen 07/07/23 - continues - Daratumumab  monotherapy maintenance. Remains on aspirin and acyclovir . Also continues on zometa  - q 3 months. Had f/u with endocrinology 07/01/23 - f/u MTN/subclinical hyperthyroid - continue methimazole . Planning for f/u thyroid  ultrasound next visit.  Had f/u with cardiology 06/05/23 - f/u regarding syncope. Recommended monitor and echo. Toprol  dose reduced to 25mg  q day. Cardiac monitor showed no arrhythmias. Minimal heart rate 52, max heart rate 113, average heart rate 67. Overall benign cardiac monitor ECHO 06/29/23 - EF 60-65% with grade I DD, moderate elevated PAP, mild to moderate MR and mild to moderate AR. Discussed - per cardiology - no findings to suggest etiology of syncope. She reports she has been doing well.  Eating. No nausea or vomiting. No chest pain.  Breathing stable. Daughter reports one brief episode at Thanksgiving where her daughters noticed for a brief moment - a change. She did not pass out. No confusion. Just felt a little funny for a brief period and then it resolved.  States she had not had much to drink that day. Also discuss with her regarding wearing her compression hose during the day. Overall she feels she is doing well.    Past Medical History:  Diagnosis Date   Anemia    GERD (gastroesophageal reflux disease)    Hypercholesterolemia    Hypertension    Renal insufficiency    Past Surgical History:  Procedure Laterality Date   TONSILLECTOMY     TUBAL LIGATION     Family History  Problem Relation Age of Onset   Hypertension Mother    Arthritis Mother    Hypertension Father    Stroke Father    Breast cancer Niece    Social History    Socioeconomic History   Marital status: Married    Spouse name: Not on file   Number of children: Not on file   Years of education: Not on file   Highest education level: Not on file  Occupational History   Not on file  Tobacco Use   Smoking status: Never   Smokeless tobacco: Never  Vaping Use   Vaping status: Never Used  Substance and Sexual Activity   Alcohol use: No   Drug use: No   Sexual activity: Not on file  Other Topics Concern   Not on file  Social History Narrative   Lives with husband Lynwood and daughter Jon. No pets.    Social Drivers of Corporate Investment Banker Strain: Not on file  Food Insecurity: Not on file  Transportation Needs: Not on file  Physical Activity: Not on file  Stress: Not on file  Social Connections: Not on file     Review of Systems  Constitutional:  Negative for appetite change and unexpected weight change.  HENT:  Negative for congestion and sinus pressure.   Respiratory:  Negative for cough, chest tightness and shortness of breath.   Cardiovascular:  Negative for chest pain and palpitations.  Gastrointestinal:  Negative for abdominal pain, diarrhea, nausea and vomiting.  Genitourinary:  Negative for difficulty urinating and dysuria.  Musculoskeletal:  Negative for joint swelling and myalgias.  Skin:  Negative for color change and rash.  Neurological:  Negative for dizziness and headaches.  Psychiatric/Behavioral:  Negative for agitation and dysphoric mood.        Objective:     Vitals reviewed.  124/70, pulse 71. Weight 149lbs.  Wt Readings from Last 3 Encounters:  07/07/23 148 lb 12.8 oz (67.5 kg)  07/01/23 148 lb (67.1 kg)  06/09/23 146 lb 11.2 oz (66.5 kg)    Physical Exam Vitals reviewed.  Constitutional:      General: She is not in acute distress.    Appearance: Normal appearance.  HENT:     Head: Normocephalic and atraumatic.     Right Ear: External ear normal.     Left Ear: External ear normal.      Mouth/Throat:     Pharynx: No oropharyngeal exudate or posterior oropharyngeal erythema.  Eyes:     General: No scleral icterus.       Right eye: No discharge.        Left eye: No discharge.     Conjunctiva/sclera: Conjunctivae normal.  Neck:     Thyroid : No thyromegaly.  Cardiovascular:     Rate and Rhythm: Normal rate and regular rhythm.  Pulmonary:     Effort: No respiratory distress.     Breath sounds: Normal breath sounds. No wheezing.  Abdominal:     General: Bowel sounds are normal.     Palpations: Abdomen is soft.     Tenderness: There is no abdominal tenderness.  Musculoskeletal:        General: No swelling or tenderness.     Cervical back: Neck supple. No tenderness.  Lymphadenopathy:     Cervical: No cervical adenopathy.  Skin:    Findings: No erythema or rash.  Neurological:     Mental Status: She is alert.  Psychiatric:        Mood and Affect: Mood normal.        Behavior: Behavior normal.      Outpatient Encounter Medications as of 07/31/2023  Medication Sig   acyclovir  (ZOVIRAX ) 400 MG tablet TAKE 1 TABLET(400 MG) BY MOUTH TWICE DAILY   amLODipine  (NORVASC ) 2.5 MG tablet Take 2 tablets (5 mg total) by mouth daily.   ascorbic acid (VITAMIN C) 250 MG tablet daily Take 2 tablets every day by oral route.   aspirin 81 MG EC tablet Take 81 mg by mouth daily.   Calcium Carb-Cholecalciferol (CALCIUM 600+D) 600-20 MG-MCG TABS Take 1 tablet by mouth 3 (three) times daily.   dexamethasone  (DECADRON ) 4 MG tablet Take 5 tablets (20 mg total) by mouth See admin instructions. Take 20mg  prior to each Daratumumab  treatment.   donepezil  (ARICEPT ) 10 MG tablet TAKE 1 TABLET(10 MG) BY MOUTH DAILY (Patient taking differently: Take 5 mg by mouth at bedtime.)   fenofibrate  (TRICOR ) 48 MG tablet TAKE 1 TABLET(48 MG) BY MOUTH DAILY   fexofenadine (ALLEGRA) 180 MG tablet Take by mouth.   hydrochlorothiazide  (HYDRODIURIL ) 25 MG tablet TAKE 1/2 TABLET BY MOUTH EVERY DAY   memantine  (NAMENDA) 5 MG tablet Take 5 mg by mouth 2 (two) times daily.   methimazole  (TAPAZOLE ) 5 MG tablet Take 1 tablet (5 mg total) by mouth daily.   metoprolol  succinate (TOPROL -XL) 25 MG 24 hr tablet Take 1 tablet (25 mg total) by mouth daily.   montelukast  (SINGULAIR ) 10 MG tablet Take 1 tablet (10 mg total) by mouth See admin instructions. Take 1 tablet one day before Daratumumab  injection.   Multiple Vitamins-Minerals (WOMENS MULTIVITAMIN  PO) Take 1 tablet by mouth daily.   omeprazole  (PRILOSEC) 20 MG capsule TAKE 1 CAPSULE(20 MG) BY MOUTH DAILY   prochlorperazine  (COMPAZINE ) 10 MG tablet Take 10 mg by mouth every 6 (six) hours as needed for nausea or vomiting.   valsartan  (DIOVAN ) 160 MG tablet TAKE 1 TABLET(160 MG) BY MOUTH DAILY   No facility-administered encounter medications on file as of 07/31/2023.     Lab Results  Component Value Date   WBC 6.2 07/07/2023   HGB 10.4 (L) 07/07/2023   HCT 31.7 (L) 07/07/2023   PLT 240 07/07/2023   GLUCOSE 117 (H) 07/07/2023   CHOL 228 (H) 04/14/2023   TRIG 39 04/14/2023   HDL 85 04/14/2023   LDLCALC 135 (H) 04/14/2023   ALT 18 07/07/2023   AST 26 07/07/2023   NA 141 07/07/2023   K 4.1 07/07/2023   CL 109 07/07/2023   CREATININE 0.96 07/07/2023   BUN 22 07/07/2023   CO2 23 07/07/2023   TSH 2.12 07/01/2023    NM PET Image Restage (PS) Whole Body Result Date: 04/20/2023 CLINICAL DATA:  Subsequent treatment strategy for multiple myeloma. EXAM: NUCLEAR MEDICINE PET WHOLE BODY TECHNIQUE: 7.9 mCi F-18 FDG was injected intravenously. Full-ring PET imaging was performed from the head to foot after the radiotracer. CT data was obtained and used for attenuation correction and anatomic localization. Fasting blood glucose: 80 mg/dl COMPARISON:  98/68/7976. FINDINGS: Mediastinal blood pool activity: SUV max 3.2 HEAD/NECK: Right level II/III lymph node measures 5 mm (6/47), SUV max 4.9. No additional abnormal hypermetabolism. Incidental CT findings: None.  CHEST: No abnormal hypermetabolism. Incidental CT findings: Thyroid  nodule in the isthmus and left lobe measures 2.6 x 3.6 cm. Atherosclerotic calcification of the aorta, aortic valve and coronary arteries. Heart is enlarged. No pericardial or pleural effusion. ABDOMEN/PELVIS: No abnormal hypermetabolism. Incidental CT findings: Liver, gallbladder, adrenal glands, kidneys, spleen, pancreas, stomach and bowel are grossly unremarkable. SKELETON: Patchy hypermetabolism throughout the spine. Focal hypermetabolism in the lateral aspect the right third rib, SUV max 3.6. Focal hypermetabolism in the left sacrum, SUV max 3.2. Incidental CT findings: Degenerative changes in the spine. EXTREMITIES: Possible degenerative uptake in the left foot. Otherwise, no abnormal hypermetabolism. Incidental CT findings: None. IMPRESSION: 1. Patchy hypermetabolism throughout the spine with focal hypermetabolism in the right third rib and left sacrum, compatible with residual metabolically active multiple myeloma. 2. Hypermetabolic 5 mm right cervical lymph node, indeterminate. Recommend attention on follow-up. 3. 3.6 cm thyroid  nodule, previously evaluated by ultrasound 08/27/2022 and biopsy 10/07/2022. Please refer to pathology report for further evaluation. No follow-up recommended unless clinically warranted. (Ref: J Am Coll Radiol. 2015 Feb;12(2): 143-50). 4. Aortic atherosclerosis (ICD10-I70.0). Coronary artery calcification. Electronically Signed   By: Newell Eke M.D.   On: 04/20/2023 10:24       Assessment & Plan:  There are no diagnoses linked to this encounter.   Allena Hamilton, MD

## 2023-08-01 LAB — LIPID PANEL
Cholesterol: 237 mg/dL — ABNORMAL HIGH (ref <200)
HDL: 89 mg/dL (ref >=50)
LDL Cholesterol (Calc): 134 mg/dL — ABNORMAL HIGH
Non-HDL Cholesterol (Calc): 148 mg/dL — ABNORMAL HIGH (ref <130)
Total CHOL/HDL Ratio: 2.7 (calc) (ref <5.0)
Triglycerides: 57 mg/dL (ref <150)

## 2023-08-02 ENCOUNTER — Encounter: Payer: Self-pay | Admitting: Internal Medicine

## 2023-08-02 NOTE — Assessment & Plan Note (Signed)
Avoid antiinflammatories.  Stay hydrated.  Follow metabolic panel.   

## 2023-08-02 NOTE — Assessment & Plan Note (Signed)
 On aricept and namenda. Continue f/u with neurology.  Stable.

## 2023-08-02 NOTE — Assessment & Plan Note (Signed)
On aricept and namenda.  Follow up with neurology.  

## 2023-08-02 NOTE — Assessment & Plan Note (Signed)
 On tricor.  Follow lipid panel.  Will check lipid panel today.

## 2023-08-02 NOTE — Assessment & Plan Note (Signed)
 Had f/u with cardiology 06/05/23 - f/u regarding syncope. Recommended monitor and echo. Toprol  dose reduced to 25mg  q day. Cardiac monitor showed no arrhythmias. Minimal heart rate 52, max heart rate 113, average heart rate 67. Overall benign cardiac monitor ECHO 06/29/23 - EF 60-65% with grade I DD, moderate elevated PAP, mild to moderate MR and mild to moderate AR. Discussed - per cardiology - no findings to suggest etiology of syncope. Had the brief episode as outlined - Thanksgiving.  Instructed to stay hydrated. Compression hose. Recent has aricept  decreased and toprol  decreased - pulse in 60s mostly now. Keep f/u with neurology and cardiology.

## 2023-08-02 NOTE — Assessment & Plan Note (Signed)
 Physical today 07/31/23. PET - 03/31/23

## 2023-08-02 NOTE — Assessment & Plan Note (Signed)
 Being followed by hematology for treatment - multiple myeloma. Treated with Daratumumab  and revilimid. S/p bone marro biopsy - 3% plasma cell, no M protein.  In CR. Continues aspirin daily. Last seen 07/07/23 - continues - Daratumumab  monotherapy maintenance. Remains on aspirin and acyclovir . Also continues on zometa  - q 3 months.

## 2023-08-02 NOTE — Assessment & Plan Note (Signed)
Thyroid ultrasound - reveals changes that appear to be consistent with multinodular goiter. Saw endocrinology - recommended FNA. Molecular testing of the thyroid - no evidence of cancer. Recommended starting methimazole.  Taking and tolerating.  Continue f/u with endocrinology.

## 2023-08-02 NOTE — Assessment & Plan Note (Signed)
 Thyroid  ultrasound - reveals changes that appear to be consistent with multinodular goiter. Saw endocrinology - recommended FNA. Molecular testing of the thyroid  - no evidence of cancer. Recommended methimazole . 07/01/23 - f/u MTN/subclinical hyperthyroid - continue methimazole . Planning for f/u thyroid  ultrasound next visit.

## 2023-08-02 NOTE — Assessment & Plan Note (Signed)
Blood pressure as outlined.  Continue amlodipine, metoprolol, hydrochlorothiazide and diovan.  Follow pressures.  Follow metabolic panel.

## 2023-08-04 ENCOUNTER — Inpatient Hospital Stay: Payer: Medicare Other

## 2023-08-04 ENCOUNTER — Inpatient Hospital Stay (HOSPITAL_BASED_OUTPATIENT_CLINIC_OR_DEPARTMENT_OTHER): Payer: Medicare Other | Admitting: Oncology

## 2023-08-04 ENCOUNTER — Encounter: Payer: Self-pay | Admitting: Oncology

## 2023-08-04 ENCOUNTER — Inpatient Hospital Stay: Payer: Medicare Other | Attending: Oncology

## 2023-08-04 VITALS — Resp 18

## 2023-08-04 VITALS — BP 165/74 | HR 76 | Temp 97.6°F | Wt 150.7 lb

## 2023-08-04 DIAGNOSIS — Z823 Family history of stroke: Secondary | ICD-10-CM | POA: Diagnosis not present

## 2023-08-04 DIAGNOSIS — Z803 Family history of malignant neoplasm of breast: Secondary | ICD-10-CM | POA: Diagnosis not present

## 2023-08-04 DIAGNOSIS — N1831 Chronic kidney disease, stage 3a: Secondary | ICD-10-CM

## 2023-08-04 DIAGNOSIS — C9000 Multiple myeloma not having achieved remission: Secondary | ICD-10-CM

## 2023-08-04 DIAGNOSIS — Z8261 Family history of arthritis: Secondary | ICD-10-CM | POA: Diagnosis not present

## 2023-08-04 DIAGNOSIS — M549 Dorsalgia, unspecified: Secondary | ICD-10-CM | POA: Diagnosis not present

## 2023-08-04 DIAGNOSIS — Z79620 Long term (current) use of immunosuppressive biologic: Secondary | ICD-10-CM | POA: Diagnosis not present

## 2023-08-04 DIAGNOSIS — C9001 Multiple myeloma in remission: Secondary | ICD-10-CM

## 2023-08-04 DIAGNOSIS — I2510 Atherosclerotic heart disease of native coronary artery without angina pectoris: Secondary | ICD-10-CM | POA: Insufficient documentation

## 2023-08-04 DIAGNOSIS — Z8249 Family history of ischemic heart disease and other diseases of the circulatory system: Secondary | ICD-10-CM | POA: Insufficient documentation

## 2023-08-04 DIAGNOSIS — R5383 Other fatigue: Secondary | ICD-10-CM | POA: Insufficient documentation

## 2023-08-04 DIAGNOSIS — Z79899 Other long term (current) drug therapy: Secondary | ICD-10-CM | POA: Diagnosis not present

## 2023-08-04 DIAGNOSIS — Z9089 Acquired absence of other organs: Secondary | ICD-10-CM | POA: Diagnosis not present

## 2023-08-04 DIAGNOSIS — I10 Essential (primary) hypertension: Secondary | ICD-10-CM | POA: Diagnosis not present

## 2023-08-04 DIAGNOSIS — M899 Disorder of bone, unspecified: Secondary | ICD-10-CM | POA: Diagnosis not present

## 2023-08-04 DIAGNOSIS — Z5111 Encounter for antineoplastic chemotherapy: Secondary | ICD-10-CM

## 2023-08-04 DIAGNOSIS — D649 Anemia, unspecified: Secondary | ICD-10-CM

## 2023-08-04 DIAGNOSIS — Z5112 Encounter for antineoplastic immunotherapy: Secondary | ICD-10-CM | POA: Insufficient documentation

## 2023-08-04 DIAGNOSIS — K219 Gastro-esophageal reflux disease without esophagitis: Secondary | ICD-10-CM | POA: Insufficient documentation

## 2023-08-04 DIAGNOSIS — I700 Atherosclerosis of aorta: Secondary | ICD-10-CM | POA: Diagnosis not present

## 2023-08-04 DIAGNOSIS — E7800 Pure hypercholesterolemia, unspecified: Secondary | ICD-10-CM | POA: Diagnosis not present

## 2023-08-04 LAB — CBC WITH DIFFERENTIAL/PLATELET
Abs Immature Granulocytes: 0.03 10*3/uL (ref 0.00–0.07)
Basophils Absolute: 0 10*3/uL (ref 0.0–0.1)
Basophils Relative: 1 %
Eosinophils Absolute: 0 10*3/uL (ref 0.0–0.5)
Eosinophils Relative: 1 %
HCT: 33.7 % — ABNORMAL LOW (ref 36.0–46.0)
Hemoglobin: 10.9 g/dL — ABNORMAL LOW (ref 12.0–15.0)
Immature Granulocytes: 1 %
Lymphocytes Relative: 21 %
Lymphs Abs: 1.3 10*3/uL (ref 0.7–4.0)
MCH: 32 pg (ref 26.0–34.0)
MCHC: 32.3 g/dL (ref 30.0–36.0)
MCV: 98.8 fL (ref 80.0–100.0)
Monocytes Absolute: 0.1 10*3/uL (ref 0.1–1.0)
Monocytes Relative: 1 %
Neutro Abs: 4.6 10*3/uL (ref 1.7–7.7)
Neutrophils Relative %: 75 %
Platelets: 261 10*3/uL (ref 150–400)
RBC: 3.41 MIL/uL — ABNORMAL LOW (ref 3.87–5.11)
RDW: 12.4 % (ref 11.5–15.5)
WBC: 6 10*3/uL (ref 4.0–10.5)
nRBC: 0 % (ref 0.0–0.2)

## 2023-08-04 LAB — COMPREHENSIVE METABOLIC PANEL
ALT: 19 U/L (ref 0–44)
AST: 29 U/L (ref 15–41)
Albumin: 4.1 g/dL (ref 3.5–5.0)
Alkaline Phosphatase: 45 U/L (ref 38–126)
Anion gap: 11 (ref 5–15)
BUN: 15 mg/dL (ref 8–23)
CO2: 23 mmol/L (ref 22–32)
Calcium: 9.5 mg/dL (ref 8.9–10.3)
Chloride: 107 mmol/L (ref 98–111)
Creatinine, Ser: 1.12 mg/dL — ABNORMAL HIGH (ref 0.44–1.00)
GFR, Estimated: 48 mL/min — ABNORMAL LOW (ref >60)
Glucose, Bld: 109 mg/dL — ABNORMAL HIGH (ref 70–99)
Potassium: 3.9 mmol/L (ref 3.5–5.1)
Sodium: 141 mmol/L (ref 135–145)
Total Bilirubin: 0.4 mg/dL (ref 0.0–1.2)
Total Protein: 6.8 g/dL (ref 6.5–8.1)

## 2023-08-04 MED ORDER — DIPHENHYDRAMINE HCL 25 MG PO CAPS
50.0000 mg | ORAL_CAPSULE | Freq: Once | ORAL | Status: AC
  Administered 2023-08-04: 50 mg via ORAL
  Filled 2023-08-04: qty 2

## 2023-08-04 MED ORDER — SODIUM CHLORIDE 0.9 % IV SOLN
INTRAVENOUS | Status: DC
Start: 2023-08-04 — End: 2023-08-04
  Filled 2023-08-04: qty 250

## 2023-08-04 MED ORDER — SODIUM CHLORIDE 0.9 % IV SOLN
3.0000 mg | Freq: Once | INTRAVENOUS | Status: AC
  Administered 2023-08-04: 3 mg via INTRAVENOUS
  Filled 2023-08-04: qty 3.75

## 2023-08-04 MED ORDER — DARATUMUMAB-HYALURONIDASE-FIHJ 1800-30000 MG-UT/15ML ~~LOC~~ SOLN
1800.0000 mg | Freq: Once | SUBCUTANEOUS | Status: AC
  Administered 2023-08-04: 1800 mg via SUBCUTANEOUS
  Filled 2023-08-04: qty 15

## 2023-08-04 MED ORDER — ACETAMINOPHEN 325 MG PO TABS
650.0000 mg | ORAL_TABLET | Freq: Once | ORAL | Status: AC
  Administered 2023-08-04: 650 mg via ORAL
  Filled 2023-08-04: qty 2

## 2023-08-04 NOTE — Assessment & Plan Note (Signed)
Chemotherapy plan as listed as above. 

## 2023-08-04 NOTE — Progress Notes (Signed)
 Hematology/Oncology Progress note [phone] [phone]     CHIEF COMPLAINTS/REASON FOR VISIT:  Follow-up for multiple myeloma  ASSESSMENT & PLAN:   Cancer Staging  Multiple myeloma (HCC) Staging form: Plasma Cell Myeloma and Plasma Cell Disorders, AJCC 8th Edition - Clinical stage from 08/22/2021: RISS Stage II (Beta-2 -microglobulin (mg/L): 4.1, Albumin (g/dL): 2.8, ISS: Stage II, High-risk cytogenetics: Absent, LDH: Normal) - Signed by Babara Call, MD on 09/06/2021   Multiple myeloma (HCC) Multiple myeloma, IgA kappa, complex hyperploid gain of 1q, S/p Daratumumab  Rd.   Revlimid  D1-21,  07/23/22 bone marrow biopsy  3% plasma cell, no M protein.   Labs are reviewed and discussed with patient.  Lab Results  Component Value Date   MPROTEIN 0.3 (H) 07/07/2023   KPAFRELGTCHN 8.3 07/07/2023   LAMBDASER 3.5 (L) 07/07/2023   KAPLAMBRATIO 2.37 (H) 07/07/2023       M protein has slightly increased from nadir. increased light chain ratio.  Proceed with  Daratumumab  monotherapy maintenance - she takes home supply of Dexamethasone  prior to her Daratumumab .  On asprin 81mg  daily and Acyclovir  400mg  BID.   Bone lesion 04/20/23 PET scan whole body,  Patchy hypermetabolism throughout the spine with focal hypermetabolism in the right third rib and left sacrum, compatible with residual metabolically active multiple myeloma.  Continue Q3 months Zometa - today  next due April  2025 Recommend calcium 1200 mg daily.  Encounter for antineoplastic chemotherapy Chemotherapy plan as listed as above.  Normocytic anemia Stable Continue monitor.   Stage 3a chronic kidney disease (HCC) recommend patient to avoid nephrotoxin.  Encourage oral hydration.  Orders Placed This Encounter  Procedures   Kappa/lambda light chains    Standing Status:   Future    Expected Date:   09/29/2023    Expiration Date:   09/28/2024   Multiple Myeloma Panel (SPEP&IFE w/QIG)    Standing Status:    Future    Expected Date:   09/29/2023    Expiration Date:   09/28/2024   Comprehensive metabolic panel    Standing Status:   Future    Expected Date:   09/29/2023    Expiration Date:   09/28/2024   CBC with Differential    Standing Status:   Future    Expected Date:   09/29/2023    Expiration Date:   09/28/2024   Kappa/lambda light chains    Standing Status:   Future    Expected Date:   10/27/2023    Expiration Date:   10/26/2024   Multiple Myeloma Panel (SPEP&IFE w/QIG)    Standing Status:   Future    Expected Date:   10/27/2023    Expiration Date:   10/26/2024   Comprehensive metabolic panel    Standing Status:   Future    Expected Date:   10/27/2023    Expiration Date:   10/26/2024   CBC with Differential    Standing Status:   Future    Expected Date:   10/27/2023    Expiration Date:   10/26/2024     Follow up  Lab/MD/Dara in 4 weeks  All questions were answered. The patient knows to call the clinic with any problems, questions or concerns.  Call Babara, MD, PhD [facility] Hematology Oncology 08/04/2023     HISTORY OF PRESENTING ILLNESS:   Stephanie Delacruz is a  89 y.o.  female presents for follow up of multiple myeloma.  Oncology History  Multiple myeloma (HCC)  08/22/2021 Initial Diagnosis   Multiple myeloma (HCC)  IgA kappa Initial M protein 5.2, free kappa light chain ratio 89.2, Kappa/lambda ratio 13.31   08/22/2021 Cancer Staging   Staging form: Plasma Cell Myeloma and Plasma Cell Disorders, AJCC 8th Edition - Clinical stage from 08/22/2021: RISS Stage II (Beta-2 -microglobulin (mg/L): 4.1, Albumin (g/dL): 2.8, ISS: Stage II, High-risk cytogenetics: Absent, LDH: Normal) - Signed by Babara Call, MD on 09/06/2021 Stage prefix: Initial diagnosis Beta 2 microglobulin range (mg/L): 3.5 to 5.49 Albumin range (g/dL): Less than 3.5 Cytogenetics: 1q addition   08/26/2021 - 09/01/2022 Chemotherapy   08/26/21 MYELOMA Daratumumab  SQ q28d  09/06/2021 added Revlimid  10mg  x 14 days.  09/27/2021 starting cycle 2  Revlimid  10 mg 21 days on, 7 days off. Weekly Dexamethasone .    08/26/2021 Bone Marrow Biopsy   Bone marrow biopsy showed hypercellular marrow with plasma cell neoplasm. 67% plasma cells in the aspirate. Cytogenetics showed complex hyperplasia, gain of 1 q.    07/23/2022 Bone Marrow Biopsy   Bone marrow biopsy showed Hypercellular bone marrow for age with trilineage hematopoiesis and 3% plasma cells  Cytogenetics are normal.   04/20/2023 Imaging   PET scan showed  1. Patchy hypermetabolism throughout the spine with focal hypermetabolism in the right third rib and left sacrum, compatible with residual metabolically active multiple myeloma. 2. Hypermetabolic 5 mm right cervical lymph node, indeterminate.Recommend attention on follow-up. 3. 3.6 cm thyroid  nodule, previously evaluated by ultrasound 08/27/2022 and biopsy 10/07/2022. Please refer to pathology report for further evaluation. No follow-up recommended unless clinically warranted. (Ref: J Am Coll Radiol. 2015 Feb;12(2): 143-50). 4. Aortic atherosclerosis (ICD10-I70.0). Coronary artery calcification.    S/p thyroid  lesion biopsy, obtained by endocrinologist. + AUS  INTERVAL HISTORY Stephanie Delacruz is a 89 y.o. female who has above history reviewed by me today presents for follow up visit for management of multiple myeloma She was accompanied by her daughter today.  She denies any nausea vomiting diarrhea.   No new complaints.   Review of Systems  Constitutional:  Positive for fatigue. Negative for appetite change, chills and fever.  HENT:   Negative for hearing loss and voice change.   Eyes:  Negative for eye problems.  Respiratory:  Negative for chest tightness and cough.   Cardiovascular:  Negative for chest pain.  Gastrointestinal:  Negative for abdominal distention, abdominal pain and blood in stool.  Endocrine: Negative for hot flashes.  Genitourinary:  Negative for difficulty urinating and frequency.   Musculoskeletal:   Positive for back pain. Negative for arthralgias.  Skin:  Negative for itching and rash.  Neurological:  Negative for extremity weakness.  Hematological:  Negative for adenopathy.  Psychiatric/Behavioral:  Negative for confusion.     MEDICAL HISTORY:  Past Medical History:  Diagnosis Date   Anemia    GERD (gastroesophageal reflux disease)    Hypercholesterolemia    Hypertension    Renal insufficiency     SURGICAL HISTORY: Past Surgical History:  Procedure Laterality Date   TONSILLECTOMY     TUBAL LIGATION      SOCIAL HISTORY: Social History   Socioeconomic History   Marital status: Married    Spouse name: Not on file   Number of children: Not on file   Years of education: Not on file   Highest education level: Not on file  Occupational History   Not on file  Tobacco Use   Smoking status: Never   Smokeless tobacco: Never  Vaping Use   Vaping status: Never Used  Substance and Sexual Activity   Alcohol  use: No   Drug use: No   Sexual activity: Not on file  Other Topics Concern   Not on file  Social History Narrative   Lives with husband Lynwood and daughter Jon. No pets.    Social Drivers of Corporate Investment Banker Strain: Not on file  Food Insecurity: Not on file  Transportation Needs: Not on file  Physical Activity: Not on file  Stress: Not on file  Social Connections: Not on file  Intimate Partner Violence: Not on file    FAMILY HISTORY: Family History  Problem Relation Age of Onset   Hypertension Mother    Arthritis Mother    Hypertension Father    Stroke Father    Breast cancer Niece     ALLERGIES:  has no known allergies.  MEDICATIONS:  Current Outpatient Medications  Medication Sig Dispense Refill   acyclovir  (ZOVIRAX ) 400 MG tablet TAKE 1 TABLET(400 MG) BY MOUTH TWICE DAILY 60 tablet 11   amLODipine  (NORVASC ) 2.5 MG tablet Take 2 tablets (5 mg total) by mouth daily. 180 tablet 2   ascorbic acid (VITAMIN C) 250 MG tablet daily Take  2 tablets every day by oral route.     aspirin 81 MG EC tablet Take 81 mg by mouth daily.     Calcium Carb-Cholecalciferol (CALCIUM 600+D) 600-20 MG-MCG TABS Take 1 tablet by mouth 3 (three) times daily.     dexamethasone  (DECADRON ) 4 MG tablet Take 5 tablets (20 mg total) by mouth See admin instructions. Take 20mg  prior to each Daratumumab  treatment. 30 tablet 1   donepezil  (ARICEPT ) 10 MG tablet Take 5 mg by mouth at bedtime.     fenofibrate  (TRICOR ) 48 MG tablet TAKE 1 TABLET(48 MG) BY MOUTH DAILY 90 tablet 2   fexofenadine (ALLEGRA) 180 MG tablet Take by mouth.     hydrochlorothiazide  (HYDRODIURIL ) 25 MG tablet TAKE 1/2 TABLET BY MOUTH EVERY DAY 45 tablet 1   memantine (NAMENDA) 5 MG tablet Take 5 mg by mouth 2 (two) times daily.     methimazole  (TAPAZOLE ) 5 MG tablet Take 1 tablet (5 mg total) by mouth daily. 90 tablet 3   metoprolol  succinate (TOPROL -XL) 25 MG 24 hr tablet Take 1 tablet (25 mg total) by mouth daily. 90 tablet 3   montelukast  (SINGULAIR ) 10 MG tablet Take 1 tablet (10 mg total) by mouth See admin instructions. Take 1 tablet one day before Daratumumab  injection. 60 tablet 0   Multiple Vitamins-Minerals (WOMENS MULTIVITAMIN PO) Take 1 tablet by mouth daily.     omeprazole  (PRILOSEC) 20 MG capsule TAKE 1 CAPSULE(20 MG) BY MOUTH DAILY 90 capsule 3   prochlorperazine  (COMPAZINE ) 10 MG tablet Take 10 mg by mouth every 6 (six) hours as needed for nausea or vomiting.     valsartan  (DIOVAN ) 160 MG tablet TAKE 1 TABLET(160 MG) BY MOUTH DAILY 90 tablet 1   No current facility-administered medications for this visit.   Facility-Administered Medications Ordered in Other Visits  Medication Dose Route Frequency Provider Last Rate Last Admin   0.9 %  sodium chloride  infusion   Intravenous Continuous Babara Call, MD   Stopped at 08/04/23 1539     PHYSICAL EXAMINATION: ECOG PERFORMANCE STATUS: 1 - Symptomatic but completely ambulatory Vitals:   08/04/23 1401  BP: (!) 165/74  Pulse: 76   Temp: 97.6 F (36.4 C)  SpO2: 100%   Filed Weights   08/04/23 1401  Weight: 150 lb 11.2 oz (68.4 kg)    Physical Exam Constitutional:  General: She is not in acute distress. HENT:     Head: Normocephalic and atraumatic.  Eyes:     General: No scleral icterus. Cardiovascular:     Rate and Rhythm: Normal rate and regular rhythm.     Heart sounds: Normal heart sounds.  Pulmonary:     Effort: Pulmonary effort is normal. No respiratory distress.     Breath sounds: No wheezing.  Abdominal:     General: Bowel sounds are normal. There is no distension.     Palpations: Abdomen is soft.  Musculoskeletal:        General: No deformity. Normal range of motion.     Cervical back: Normal range of motion and neck supple.  Skin:    General: Skin is warm and dry.     Findings: No erythema or rash.  Neurological:     Mental Status: She is alert and oriented to person, place, and time. Mental status is at baseline.     Cranial Nerves: No cranial nerve deficit.     Coordination: Coordination normal.  Psychiatric:        Mood and Affect: Mood normal.     LABORATORY DATA:  I have reviewed the data as listed    Latest Ref Rng & Units 08/04/2023    1:02 PM 07/07/2023    1:14 PM 06/09/2023    8:22 AM  CBC  WBC 4.0 - 10.5 K/uL 6.0  6.2  5.1   Hemoglobin 12.0 - 15.0 g/dL 89.0  89.5  89.6   Hematocrit 36.0 - 46.0 % 33.7  31.7  31.6   Platelets 150 - 400 K/uL 261  240  220       Latest Ref Rng & Units 08/04/2023    1:02 PM 07/07/2023    1:14 PM 06/09/2023    8:22 AM  CMP  Glucose 70 - 99 mg/dL 890  882  84   BUN 8 - 23 mg/dL 15  22  19    Creatinine 0.44 - 1.00 mg/dL 8.87  9.03  8.91   Sodium 135 - 145 mmol/L 141  141  142   Potassium 3.5 - 5.1 mmol/L 3.9  4.1  3.6   Chloride 98 - 111 mmol/L 107  109  109   CO2 22 - 32 mmol/L 23  23  25    Calcium 8.9 - 10.3 mg/dL 9.5  9.5  9.3   Total Protein 6.5 - 8.1 g/dL 6.8  6.7  6.4   Total Bilirubin 0.0 - 1.2 mg/dL 0.4  0.6  0.7    Alkaline Phos 38 - 126 U/L 45  40  39   AST 15 - 41 U/L 29  26  21    ALT 0 - 44 U/L 19  18  15       Iron/TIBC/Ferritin/ %Sat    Component Value Date/Time   IRON 54 07/24/2021 1212   TIBC 277 07/24/2021 1212   FERRITIN 93 07/24/2021 1212   IRONPCTSAT 20 07/24/2021 1212       RADIOGRAPHIC STUDIES: I have personally reviewed the radiological images as listed and agreed with the findings in the report. LONG TERM MONITOR (3-14 DAYS) Result Date: 07/02/2023 Patch Wear Time:  13 days and 17 hours (2024-11-13T11:46:29-0500 to 2024-11-27T04:48:29-0500) Patient had a min HR of 52 bpm, max HR of 113 bpm, and avg HR of 67 bpm. Predominant underlying rhythm was Sinus Rhythm. Isolated SVEs were rare (<1.0%), SVE Couplets were rare (<1.0%), and SVE Triplets were rare (<1.0%). Isolated VEs  were rare (<1.0%), and no VE Couplets or VE Triplets were present. Ventricular Trigeminy was present. Conclusion Cardiac monitor showed no arrhythmias. Minimal heart rate 52, max heart rate 113, average heart rate 67. Overall benign cardiac monitor.   ECHOCARDIOGRAM COMPLETE Result Date: 06/30/2023    ECHOCARDIOGRAM REPORT   Patient Name:   Stephanie Delacruz Date of Exam: 06/29/2023 Medical Rec #:  [id number]    Height:       64.0 in Accession #:    [id number]   Weight:       146.7 lb Date of Birth:  08-01-1933     BSA:          1.715 m Patient Age:    89 years     BP:           142/77 mmHg Patient Gender: F            HR:           61 bpm. Exam Location:  [place] Procedure: 2D Echo, 3D Echo, Cardiac Doppler, Color Doppler and Strain Analysis Indications:    R55 Syncope  History:        Patient has no prior history of Echocardiogram examinations.                 Stroke, Arrythmias:Bradycardia, Signs/Symptoms:Syncope and                 Murmur; Risk Factors:Hypertension and Non-Smoker.  Sonographer:    Doyal Point MHA, BS, RDCS Referring Phys: [id number] BRIAN AGBOR-ETANG IMPRESSIONS  1. Left ventricular ejection fraction, by  estimation, is 60 to 65%. The left ventricle has normal function. The left ventricle has no regional wall motion abnormalities. Left ventricular diastolic parameters are consistent with Grade I diastolic dysfunction (impaired relaxation). The average left ventricular global longitudinal strain is -21.2 %. The global longitudinal strain is normal.  2. Right ventricular systolic function is normal. The right ventricular size is normal. There is moderately elevated pulmonary artery systolic pressure. The estimated right ventricular systolic pressure is 51.8 mmHg.  3. Left atrial size was mild to moderately dilated.  4. The mitral valve is normal in structure. Mild to moderate mitral valve regurgitation.  5. The aortic valve is tricuspid. Aortic valve regurgitation is mild to moderate. Aortic valve sclerosis/calcification is present, without any evidence of aortic stenosis.  6. The inferior vena cava is normal in size with <50% respiratory variability, suggesting right atrial pressure of 8 mmHg. FINDINGS  Left Ventricle: Left ventricular ejection fraction, by estimation, is 60 to 65%. The left ventricle has normal function. The left ventricle has no regional wall motion abnormalities. The average left ventricular global longitudinal strain is -21.2 %. The global longitudinal strain is normal. The left ventricular internal cavity size was normal in size. There is no left ventricular hypertrophy. Left ventricular diastolic parameters are consistent with Grade I diastolic dysfunction (impaired relaxation). Right Ventricle: The right ventricular size is normal. No increase in right ventricular wall thickness. Right ventricular systolic function is normal. There is moderately elevated pulmonary artery systolic pressure. The tricuspid regurgitant velocity is 3.31 m/s, and with an assumed right atrial pressure of 8 mmHg, the estimated right ventricular systolic pressure is 51.8 mmHg. Left Atrium: Left atrial size was mild to  moderately dilated. Right Atrium: Right atrial size was normal in size. Pericardium: There is no evidence of pericardial effusion. Mitral Valve: The mitral valve is normal in structure. Mild to moderate mitral valve regurgitation. Tricuspid Valve: The tricuspid  valve is normal in structure. Tricuspid valve regurgitation is mild. Aortic Valve: The aortic valve is tricuspid. Aortic valve regurgitation is mild to moderate. Aortic valve sclerosis/calcification is present, without any evidence of aortic stenosis. Pulmonic Valve: The pulmonic valve was normal in structure. Pulmonic valve regurgitation is mild to moderate. Aorta: The aortic root and ascending aorta are structurally normal, with no evidence of dilitation. Venous: The inferior vena cava is normal in size with less than 50% respiratory variability, suggesting right atrial pressure of 8 mmHg. IAS/Shunts: No atrial level shunt detected by color flow Doppler.  LEFT VENTRICLE PLAX 2D LVIDd:         3.20 cm Diastology LVIDs:         1.80 cm LV e' medial:    7.40 cm/s LV PW:         0.90 cm LV E/e' medial:  8.6 LV IVS:        0.90 cm LV e' lateral:   12.00 cm/s                        LV E/e' lateral: 5.3                         2D Longitudinal Strain                        2D Strain GLS Avg:     -21.2 %                         3D Volume EF:                        3D EF:        65 %                        LV EDV:       154 ml                        LV ESV:       53 ml                        LV SV:        100 ml RIGHT VENTRICLE             IVC RV Basal diam:  3.20 cm     IVC diam: 2.10 cm RV Mid diam:    2.90 cm RV S prime:     13.40 cm/s TAPSE (M-mode): 2.9 cm LEFT ATRIUM             Index        RIGHT ATRIUM           Index LA diam:        4.00 cm 2.33 cm/m   RA Area:     13.40 cm LA Vol (A2C):   75.9 ml 44.26 ml/m  RA Volume:   28.80 ml  16.79 ml/m LA Vol (A4C):   64.6 ml 37.67 ml/m LA Biplane Vol: 71.7 ml 41.81 ml/m   AORTA Ao Root diam: 2.90 cm Ao Asc  diam:  2.80 cm MITRAL VALVE               TRICUSPID VALVE MV Area (PHT): 3.48 cm  TR Peak grad:   43.8 mmHg MV Decel Time: 218 msec    TR Vmax:        331.00 cm/s MV E velocity: 63.40 cm/s MV A velocity: 72.80 cm/s MV E/A ratio:  0.87 Redell Cave MD Electronically signed by Redell Cave MD Signature Date/Time: 06/30/2023/9:43:40 AM    Final

## 2023-08-04 NOTE — Assessment & Plan Note (Signed)
 04/20/23 PET scan whole body,  Patchy hypermetabolism throughout the spine with focal hypermetabolism in the right third rib and left sacrum, compatible with residual metabolically active multiple myeloma.  Continue Q3 months Zometa - today  next due April  2025 Recommend calcium 1200 mg daily.

## 2023-08-04 NOTE — Assessment & Plan Note (Signed)
recommend patient to avoid nephrotoxin.  Encourage oral hydration. 

## 2023-08-04 NOTE — Assessment & Plan Note (Signed)
 Stable. Continue monitor

## 2023-08-04 NOTE — Assessment & Plan Note (Signed)
 Multiple myeloma, IgA kappa, complex hyperploid gain of 1q, S/p Daratumumab  Rd.   Revlimid  D1-21,  07/23/22 bone marrow biopsy  3% plasma cell, no M protein.   Labs are reviewed and discussed with patient.  Lab Results  Component Value Date   MPROTEIN 0.3 (H) 07/07/2023   KPAFRELGTCHN 8.3 07/07/2023   LAMBDASER 3.5 (L) 07/07/2023   KAPLAMBRATIO 2.37 (H) 07/07/2023       M protein has slightly increased from nadir. increased light chain ratio.  Proceed with  Daratumumab  monotherapy maintenance - she takes home supply of Dexamethasone  prior to her Daratumumab .  On asprin 81mg  daily and Acyclovir  400mg  BID.

## 2023-08-05 LAB — KAPPA/LAMBDA LIGHT CHAINS
Kappa free light chain: 8.4 mg/L (ref 3.3–19.4)
Kappa, lambda light chain ratio: 1.53 (ref 0.26–1.65)
Lambda free light chains: 5.5 mg/L — ABNORMAL LOW (ref 5.7–26.3)

## 2023-08-10 ENCOUNTER — Encounter: Payer: Self-pay | Admitting: Oncology

## 2023-08-10 ENCOUNTER — Other Ambulatory Visit: Payer: Self-pay

## 2023-08-10 DIAGNOSIS — C9000 Multiple myeloma not having achieved remission: Secondary | ICD-10-CM

## 2023-08-10 MED ORDER — ACYCLOVIR 400 MG PO TABS: 400.0000 mg | ORAL_TABLET | Freq: Two times a day (BID) | ORAL | 11 refills | Status: DC

## 2023-08-11 ENCOUNTER — Encounter: Payer: Self-pay | Admitting: Oncology

## 2023-08-11 LAB — MULTIPLE MYELOMA PANEL, SERUM
Albumin SerPl Elph-Mcnc: 3.8 g/dL (ref 2.9–4.4)
Albumin/Glob SerPl: 1.5 (ref 0.7–1.7)
Alpha 1: 0.3 g/dL (ref 0.0–0.4)
Alpha2 Glob SerPl Elph-Mcnc: 0.7 g/dL (ref 0.4–1.0)
B-Globulin SerPl Elph-Mcnc: 1.2 g/dL (ref 0.7–1.3)
Gamma Glob SerPl Elph-Mcnc: 0.4 g/dL (ref 0.4–1.8)
Globulin, Total: 2.6 g/dL (ref 2.2–3.9)
IgA: 185 mg/dL (ref 64–422)
IgG (Immunoglobin G), Serum: 543 mg/dL — ABNORMAL LOW (ref 586–1602)
IgM (Immunoglobulin M), Srm: 20 mg/dL — ABNORMAL LOW (ref 26–217)
Total Protein ELP: 6.4 g/dL (ref 6.0–8.5)

## 2023-08-12 ENCOUNTER — Ambulatory Visit: Payer: Medicare Other | Attending: Cardiology | Admitting: Cardiology

## 2023-08-12 ENCOUNTER — Encounter: Payer: Self-pay | Admitting: Cardiology

## 2023-08-12 VITALS — BP 138/64 | HR 67 | Ht 64.0 in | Wt 148.4 lb

## 2023-08-12 DIAGNOSIS — I10 Essential (primary) hypertension: Secondary | ICD-10-CM | POA: Diagnosis not present

## 2023-08-12 DIAGNOSIS — R55 Syncope and collapse: Secondary | ICD-10-CM | POA: Diagnosis not present

## 2023-08-12 DIAGNOSIS — R001 Bradycardia, unspecified: Secondary | ICD-10-CM

## 2023-08-12 MED ORDER — AMLODIPINE BESYLATE 2.5 MG PO TABS: 2.5000 mg | ORAL_TABLET | Freq: Every day | ORAL | Status: DC

## 2023-08-12 NOTE — Progress Notes (Signed)
 Cardiology Office Note:    Date:  08/12/2023   ID:  Stephanie Delacruz, DOB [birth year], MRN [id number]  PCP:  Dellar Fenton, MD   [facility] Providers Cardiologist:  Constancia Delton, MD     Referring MD: Dellar Fenton, MD   Chief Complaint  Patient presents with   Follow-up    Discuss cardiac testing results.  Patient reports another dizzy episode on Thanksgiving and bp was low.  BP back at baseline on recheck a few minutes later.      History of Present Illness:    Stephanie Delacruz is a 89 y.o. female with a hx of hypertension, multiple myeloma, CKD, presenting for follow-up.  Previously seen due to presyncope and bradycardia.    Heart rate was in the 50s.  Metoprolol  was previously reduced to 25 mg daily with improvement in heart rates.  Echocardiogram and cardiac monitor was ordered to evaluate cardiac etiology for syncope.  Since last visit, she has had 1 episode of dizziness, almost passing out.  Endorses stool incontinence during both syncopal episodes.  Family checked her blood pressure during last episode around Thanksgiving, BP was around 100s/60s.  Denies losing consciousness.  Past Medical History:  Diagnosis Date   Anemia    GERD (gastroesophageal reflux disease)    Hypercholesterolemia    Hypertension    Renal insufficiency     Past Surgical History:  Procedure Laterality Date   TONSILLECTOMY     TUBAL LIGATION      Current Medications: Current Meds  Medication Sig   acyclovir  (ZOVIRAX ) 400 MG tablet Take 1 tablet (400 mg total) by mouth 2 (two) times daily.   ascorbic acid (VITAMIN C) 250 MG tablet daily Take 2 tablets every day by oral route.   aspirin 81 MG EC tablet Take 81 mg by mouth daily.   Calcium Carb-Cholecalciferol (CALCIUM 600+D) 600-20 MG-MCG TABS Take 1 tablet by mouth 3 (three) times daily.   dexamethasone  (DECADRON ) 4 MG tablet Take 5 tablets (20 mg total) by mouth See admin instructions. Take 20mg  prior to each Daratumumab  treatment.    donepezil  (ARICEPT ) 10 MG tablet Take 5 mg by mouth at bedtime.   fenofibrate  (TRICOR ) 48 MG tablet TAKE 1 TABLET(48 MG) BY MOUTH DAILY   fexofenadine (ALLEGRA) 180 MG tablet Take by mouth.   hydrochlorothiazide  (HYDRODIURIL ) 25 MG tablet TAKE 1/2 TABLET BY MOUTH EVERY DAY   memantine (NAMENDA) 5 MG tablet Take 5 mg by mouth 2 (two) times daily.   methimazole  (TAPAZOLE ) 5 MG tablet Take 1 tablet (5 mg total) by mouth daily.   metoprolol  succinate (TOPROL -XL) 25 MG 24 hr tablet Take 1 tablet (25 mg total) by mouth daily.   montelukast  (SINGULAIR ) 10 MG tablet Take 1 tablet (10 mg total) by mouth See admin instructions. Take 1 tablet one day before Daratumumab  injection.   Multiple Vitamins-Minerals (WOMENS MULTIVITAMIN PO) Take 1 tablet by mouth daily.   omeprazole  (PRILOSEC) 20 MG capsule TAKE 1 CAPSULE(20 MG) BY MOUTH DAILY   prochlorperazine  (COMPAZINE ) 10 MG tablet Take 10 mg by mouth every 6 (six) hours as needed for nausea or vomiting.   valsartan  (DIOVAN ) 160 MG tablet TAKE 1 TABLET(160 MG) BY MOUTH DAILY   [DISCONTINUED] amLODipine  (NORVASC ) 2.5 MG tablet Take 2 tablets (5 mg total) by mouth daily.     Allergies:   Patient has no known allergies.   Social History   Socioeconomic History   Marital status: Married    Spouse name: Not on  file   Number of children: Not on file   Years of education: Not on file   Highest education level: Not on file  Occupational History   Not on file  Tobacco Use   Smoking status: Never   Smokeless tobacco: Never  Vaping Use   Vaping status: Never Used  Substance and Sexual Activity   Alcohol use: No   Drug use: No   Sexual activity: Not on file  Other Topics Concern   Not on file  Social History Narrative   Lives with husband Royston Cornea and daughter Shelvy Dickens. No pets.    Social Drivers of Corporate investment banker Strain: Not on file  Food Insecurity: Not on file  Transportation Needs: Not on file  Physical Activity: Not on file   Stress: Not on file  Social Connections: Not on file     Family History: The patient's family history includes Arthritis in her mother; Breast cancer in her niece; Hypertension in her father and mother; Stroke in her father.  ROS:   Please see the history of present illness.     All other systems reviewed and are negative.  EKGs/Labs/Other Studies Reviewed:    The following studies were reviewed today:       Recent Labs: 07/01/2023: TSH 2.12 08/04/2023: ALT 19; BUN 15; Creatinine, Ser 1.12; Hemoglobin 10.9; Platelets 261; Potassium 3.9; Sodium 141  Recent Lipid Panel    Component Value Date/Time   CHOL 237 (H) 07/31/2023 1412   TRIG 57 07/31/2023 1412   HDL 89 07/31/2023 1412   CHOLHDL 2.7 07/31/2023 1412   VLDL 8 04/14/2023 0837   LDLCALC 134 (H) 07/31/2023 1412     Risk Assessment/Calculations:               Physical Exam:    VS:  BP 138/64 (BP Location: Left Arm, Patient Position: Sitting, Cuff Size: Normal)   Pulse 67   Ht 5\' 4"  (1.626 m)   Wt 148 lb 6.4 oz (67.3 kg)   SpO2 100%   BMI 25.47 kg/m     Wt Readings from Last 3 Encounters:  08/12/23 148 lb 6.4 oz (67.3 kg)  08/04/23 150 lb 11.2 oz (68.4 kg)  07/31/23 149 lb 3.2 oz (67.7 kg)     GEN:  Well nourished, well developed in no acute distress HEENT: Normal NECK: No JVD; No carotid bruits CARDIAC: RRR, no murmurs, rubs, gallops RESPIRATORY:  Clear to auscultation without rales, wheezing or rhonchi  ABDOMEN: Soft, non-tender, non-distended MUSCULOSKELETAL:  No edema; No deformity  SKIN: Warm and dry NEUROLOGIC:  Alert and oriented x 3 PSYCHIATRIC:  Normal affect   ASSESSMENT:    1. Syncope and collapse   2. Bradycardia   3. Primary hypertension     PLAN:    In order of problems listed above:  syncope, etiology unclear.  Endorses stool incontinence.  Unsure if seizure/neurological activity contributing.  Echo 06/2023 EF 60 to 65%, no significant structural abnormalities.  Cardiac  monitor 06/2023 average heart rate 67, no significant arrhythmias, overall benign cardiac monitor.  Had another episode with systolics in the 100s suggesting orthostasis/episodic hypotension. History of sinus bradycardia, resolved with reducing Toprol -XL to 25 mg daily.  Hypertension, BP controlled systolic in 130s.  Reduce Norvasc  to 2.5 mg daily.  Continue Diovan  160 mg daily, HCTZ, metoprolol .  Okay to let BP run high up to 150s okay given history of syncope and patient's age.  Consider stopping Norvasc  at follow-up visit if BP  still low.  Follow-up in 3-4 months.    Medication Adjustments/Labs and Tests Ordered: Current medicines are reviewed at length with the patient today.  Concerns regarding medicines are outlined above.  No orders of the defined types were placed in this encounter.  Meds ordered this encounter  Medications   amLODipine  (NORVASC ) 2.5 MG tablet    Sig: Take 1 tablet (2.5 mg total) by mouth daily.    Patient Instructions  Medication Instructions:   DECREASE Amlodipine  - Take one tablet ( 2.5mg ) by mouth daily.   *If you need a refill on your cardiac medications before your next appointment, please call your pharmacy*   Lab Work:  None Ordered  If you have labs (blood work) drawn today and your tests are completely normal, you will receive your results only by: MyChart Message (if you have MyChart) OR A paper copy in the mail If you have any lab test that is abnormal or we need to change your treatment, we will call you to review the results.   Testing/Procedures:  None Ordered   Follow-Up: At [facility], you and your health needs are our priority.  As part of our continuing mission to provide you with exceptional heart care, we have created designated Provider Care Teams.  These Care Teams include your primary Cardiologist (physician) and Advanced Practice Providers (APPs -  Physician Assistants and Nurse Practitioners) who all work together  to provide you with the care you need, when you need it.  We recommend signing up for the patient portal called "MyChart".  Sign up information is provided on this After Visit Summary.  MyChart is used to connect with patients for Virtual Visits (Telemedicine).  Patients are able to view lab/test results, encounter notes, upcoming appointments, etc.  Non-urgent messages can be sent to your provider as well.   To learn more about what you can do with MyChart, go to [email or web address].    Your next appointment:   2 - 3  month(s)  Provider:   Constancia Delton, MD ONLY      Signed, Constancia Delton, MD  08/12/2023 10:13 AM    [facility]

## 2023-08-12 NOTE — Patient Instructions (Signed)
 Medication Instructions:   DECREASE Amlodipine  - Take one tablet ( 2.5mg ) by mouth daily.   *If you need a refill on your cardiac medications before your next appointment, please call your pharmacy*   Lab Work:  None Ordered  If you have labs (blood work) drawn today and your tests are completely normal, you will receive your results only by: MyChart Message (if you have MyChart) OR A paper copy in the mail If you have any lab test that is abnormal or we need to change your treatment, we will call you to review the results.   Testing/Procedures:  None Ordered   Follow-Up: At [facility], you and your health needs are our priority.  As part of our continuing mission to provide you with exceptional heart care, we have created designated Provider Care Teams.  These Care Teams include your primary Cardiologist (physician) and Advanced Practice Providers (APPs -  Physician Assistants and Nurse Practitioners) who all work together to provide you with the care you need, when you need it.  We recommend signing up for the patient portal called "MyChart".  Sign up information is provided on this After Visit Summary.  MyChart is used to connect with patients for Virtual Visits (Telemedicine).  Patients are able to view lab/test results, encounter notes, upcoming appointments, etc.  Non-urgent messages can be sent to your provider as well.   To learn more about what you can do with MyChart, go to [email or web address].    Your next appointment:   2 - 3  month(s)  Provider:   Constancia Delton, MD ONLY

## 2023-09-01 ENCOUNTER — Inpatient Hospital Stay (HOSPITAL_BASED_OUTPATIENT_CLINIC_OR_DEPARTMENT_OTHER): Payer: Medicare Other | Admitting: Oncology

## 2023-09-01 ENCOUNTER — Inpatient Hospital Stay: Payer: Medicare Other | Attending: Oncology

## 2023-09-01 ENCOUNTER — Inpatient Hospital Stay: Payer: Medicare Other

## 2023-09-01 ENCOUNTER — Encounter: Payer: Self-pay | Admitting: Oncology

## 2023-09-01 VITALS — BP 148/71 | HR 65 | Temp 98.3°F | Resp 18 | Wt 151.2 lb

## 2023-09-01 DIAGNOSIS — Z79899 Other long term (current) drug therapy: Secondary | ICD-10-CM | POA: Diagnosis not present

## 2023-09-01 DIAGNOSIS — C9001 Multiple myeloma in remission: Secondary | ICD-10-CM | POA: Diagnosis not present

## 2023-09-01 DIAGNOSIS — Z8261 Family history of arthritis: Secondary | ICD-10-CM | POA: Insufficient documentation

## 2023-09-01 DIAGNOSIS — Z823 Family history of stroke: Secondary | ICD-10-CM | POA: Diagnosis not present

## 2023-09-01 DIAGNOSIS — Z5111 Encounter for antineoplastic chemotherapy: Secondary | ICD-10-CM | POA: Diagnosis not present

## 2023-09-01 DIAGNOSIS — I700 Atherosclerosis of aorta: Secondary | ICD-10-CM | POA: Diagnosis not present

## 2023-09-01 DIAGNOSIS — I10 Essential (primary) hypertension: Secondary | ICD-10-CM | POA: Diagnosis not present

## 2023-09-01 DIAGNOSIS — C9000 Multiple myeloma not having achieved remission: Secondary | ICD-10-CM | POA: Diagnosis not present

## 2023-09-01 DIAGNOSIS — Z5112 Encounter for antineoplastic immunotherapy: Secondary | ICD-10-CM | POA: Diagnosis not present

## 2023-09-01 DIAGNOSIS — Z8249 Family history of ischemic heart disease and other diseases of the circulatory system: Secondary | ICD-10-CM | POA: Insufficient documentation

## 2023-09-01 DIAGNOSIS — E7800 Pure hypercholesterolemia, unspecified: Secondary | ICD-10-CM | POA: Insufficient documentation

## 2023-09-01 DIAGNOSIS — N1831 Chronic kidney disease, stage 3a: Secondary | ICD-10-CM

## 2023-09-01 DIAGNOSIS — R5383 Other fatigue: Secondary | ICD-10-CM | POA: Insufficient documentation

## 2023-09-01 DIAGNOSIS — M899 Disorder of bone, unspecified: Secondary | ICD-10-CM | POA: Diagnosis not present

## 2023-09-01 DIAGNOSIS — I2510 Atherosclerotic heart disease of native coronary artery without angina pectoris: Secondary | ICD-10-CM | POA: Diagnosis not present

## 2023-09-01 DIAGNOSIS — M549 Dorsalgia, unspecified: Secondary | ICD-10-CM | POA: Diagnosis not present

## 2023-09-01 DIAGNOSIS — D649 Anemia, unspecified: Secondary | ICD-10-CM

## 2023-09-01 DIAGNOSIS — Z79624 Long term (current) use of inhibitors of nucleotide synthesis: Secondary | ICD-10-CM | POA: Diagnosis not present

## 2023-09-01 DIAGNOSIS — Z803 Family history of malignant neoplasm of breast: Secondary | ICD-10-CM | POA: Insufficient documentation

## 2023-09-01 DIAGNOSIS — Z9089 Acquired absence of other organs: Secondary | ICD-10-CM | POA: Insufficient documentation

## 2023-09-01 LAB — COMPREHENSIVE METABOLIC PANEL
ALT: 15 U/L (ref 0–44)
AST: 26 U/L (ref 15–41)
Albumin: 3.7 g/dL (ref 3.5–5.0)
Alkaline Phosphatase: 42 U/L (ref 38–126)
Anion gap: 11 (ref 5–15)
BUN: 16 mg/dL (ref 8–23)
CO2: 23 mmol/L (ref 22–32)
Calcium: 9.4 mg/dL (ref 8.9–10.3)
Chloride: 106 mmol/L (ref 98–111)
Creatinine, Ser: 1.07 mg/dL — ABNORMAL HIGH (ref 0.44–1.00)
GFR, Estimated: 50 mL/min — ABNORMAL LOW (ref >60)
Glucose, Bld: 97 mg/dL (ref 70–99)
Potassium: 3.7 mmol/L (ref 3.5–5.1)
Sodium: 140 mmol/L (ref 135–145)
Total Bilirubin: 0.5 mg/dL (ref 0.0–1.2)
Total Protein: 6.5 g/dL (ref 6.5–8.1)

## 2023-09-01 LAB — CBC WITH DIFFERENTIAL/PLATELET
Abs Immature Granulocytes: 0.01 10*3/uL (ref 0.00–0.07)
Basophils Absolute: 0.1 10*3/uL (ref 0.0–0.1)
Basophils Relative: 1 %
Eosinophils Absolute: 0.2 10*3/uL (ref 0.0–0.5)
Eosinophils Relative: 3 %
HCT: 31.8 % — ABNORMAL LOW (ref 36.0–46.0)
Hemoglobin: 10.4 g/dL — ABNORMAL LOW (ref 12.0–15.0)
Immature Granulocytes: 0 %
Lymphocytes Relative: 31 %
Lymphs Abs: 1.7 10*3/uL (ref 0.7–4.0)
MCH: 32 pg (ref 26.0–34.0)
MCHC: 32.7 g/dL (ref 30.0–36.0)
MCV: 97.8 fL (ref 80.0–100.0)
Monocytes Absolute: 0.3 10*3/uL (ref 0.1–1.0)
Monocytes Relative: 6 %
Neutro Abs: 3.2 10*3/uL (ref 1.7–7.7)
Neutrophils Relative %: 59 %
Platelets: 208 10*3/uL (ref 150–400)
RBC: 3.25 MIL/uL — ABNORMAL LOW (ref 3.87–5.11)
RDW: 12.4 % (ref 11.5–15.5)
WBC: 5.5 10*3/uL (ref 4.0–10.5)
nRBC: 0 % (ref 0.0–0.2)

## 2023-09-01 MED ORDER — DIPHENHYDRAMINE HCL 25 MG PO CAPS
50.0000 mg | ORAL_CAPSULE | Freq: Once | ORAL | Status: AC
  Administered 2023-09-01: 50 mg via ORAL
  Filled 2023-09-01: qty 2

## 2023-09-01 MED ORDER — ACETAMINOPHEN 325 MG PO TABS
650.0000 mg | ORAL_TABLET | Freq: Once | ORAL | Status: AC
  Administered 2023-09-01: 650 mg via ORAL
  Filled 2023-09-01: qty 2

## 2023-09-01 MED ORDER — DARATUMUMAB-HYALURONIDASE-FIHJ 1800-30000 MG-UT/15ML ~~LOC~~ SOLN
1800.0000 mg | Freq: Once | SUBCUTANEOUS | Status: AC
  Administered 2023-09-01: 1800 mg via SUBCUTANEOUS
  Filled 2023-09-01: qty 15

## 2023-09-01 NOTE — Assessment & Plan Note (Addendum)
Multiple myeloma, IgA kappa, complex hyperploid gain of 1q, S/p Daratumumab Rd.   Revlimid D1-21,  07/23/22 bone marrow biopsy  3% plasma cell, no M protein.   Labs are reviewed and discussed with patient.  Lab Results  Component Value Date   MPROTEIN Not Observed 08/04/2023   KPAFRELGTCHN 8.4 08/04/2023   LAMBDASER 5.5 (L) 08/04/2023   KAPLAMBRATIO 1.53 08/04/2023     M protein has slightly increased from nadir. Stable light chain ratio.  Proceed with  Daratumumab monotherapy maintenance - she takes home supply of Dexamethasone prior to her Daratumumab.  On asprin 81mg  daily and Acyclovir 400mg  BID.

## 2023-09-01 NOTE — Patient Instructions (Signed)
 [facility] - A DEPT OF [facility]  Discharge Instructions: Thank you for choosing [facility] to provide your oncology and hematology care.  If you have a lab appointment with the Cancer Center, please go directly to the Cancer Center and check in at the registration area.  Wear comfortable clothing and clothing appropriate for easy access to any Portacath or PICC line.   We strive to give you quality time with your provider. You may need to reschedule your appointment if you arrive late (15 or more minutes).  Arriving late affects you and other patients whose appointments are after yours.  Also, if you miss three or more appointments without notifying the office, you may be dismissed from the clinic at the provider's discretion.      For prescription refill requests, have your pharmacy contact our office and allow 72 hours for refills to be completed.    Today you received the following chemotherapy and/or immunotherapy agents :   daratumumab     To help prevent nausea and vomiting after your treatment, we encourage you to take your nausea medication as directed.  BELOW ARE SYMPTOMS THAT SHOULD BE REPORTED IMMEDIATELY: *FEVER GREATER THAN 100.4 F (38 C) OR HIGHER *CHILLS OR SWEATING *NAUSEA AND VOMITING THAT IS NOT CONTROLLED WITH YOUR NAUSEA MEDICATION *UNUSUAL SHORTNESS OF BREATH *UNUSUAL BRUISING OR BLEEDING *URINARY PROBLEMS (pain or burning when urinating, or frequent urination) *BOWEL PROBLEMS (unusual diarrhea, constipation, pain near the anus) TENDERNESS IN MOUTH AND THROAT WITH OR WITHOUT PRESENCE OF ULCERS (sore throat, sores in mouth, or a toothache) UNUSUAL RASH, SWELLING OR PAIN  UNUSUAL VAGINAL DISCHARGE OR ITCHING   Items with * indicate a potential emergency and should be followed up as soon as possible or go to the Emergency Department if any problems should occur.  Please show the CHEMOTHERAPY ALERT CARD or  IMMUNOTHERAPY ALERT CARD at check-in to the Emergency Department and triage nurse.  Should you have questions after your visit or need to cancel or reschedule your appointment, please contact [facility] - A DEPT OF [facility]  [phone] and follow the prompts.  Office hours are 8:00 a.m. to 4:30 p.m. Monday - Friday. Please note that voicemails left after 4:00 p.m. may not be returned until the following business day.  We are closed weekends and major holidays. You have access to a nurse at all times for urgent questions. Please call the main number to the clinic [phone] and follow the prompts.  For any non-urgent questions, you may also contact your provider using MyChart. We now offer e-Visits for anyone 25 and older to request care online for non-urgent symptoms. For details visit [email or web address].   Also download the MyChart app! Go to the app store, search MyChart, open the app, select [place], and log in with your MyChart username and password.

## 2023-09-01 NOTE — Progress Notes (Signed)
Hematology/Oncology Progress note [phone] [phone]     CHIEF COMPLAINTS/REASON FOR VISIT:  Follow-up for multiple myeloma  ASSESSMENT & PLAN:   Cancer Staging  Multiple myeloma (HCC) Staging form: Plasma Cell Myeloma and Plasma Cell Disorders, AJCC 8th Edition - Clinical stage from 08/22/2021: RISS Stage II (Beta-2-microglobulin (mg/L): 4.1, Albumin (g/dL): 2.8, ISS: Stage II, High-risk cytogenetics: Absent, LDH: Normal) - Signed by Rickard Patience, MD on 09/06/2021   Multiple myeloma (HCC) Multiple myeloma, IgA kappa, complex hyperploid gain of 1q, S/p Daratumumab Rd.   Revlimid D1-21,  07/23/22 bone marrow biopsy  3% plasma cell, no M protein.   Labs are reviewed and discussed with patient.  Lab Results  Component Value Date   MPROTEIN Not Observed 08/04/2023   KPAFRELGTCHN 8.4 08/04/2023   LAMBDASER 5.5 (L) 08/04/2023   KAPLAMBRATIO 1.53 08/04/2023     M protein has slightly increased from nadir. Stable light chain ratio.  Proceed with  Daratumumab monotherapy maintenance - she takes home supply of Dexamethasone prior to her Daratumumab.  On asprin 81mg  daily and Acyclovir 400mg  BID.   Bone lesion 04/20/23 PET scan whole body,  Patchy hypermetabolism throughout the spine with focal hypermetabolism in the right third rib and left sacrum, compatible with residual metabolically active multiple myeloma.  Continue Q3 months Zometa- next due April  2025 Recommend calcium 1200 mg daily.  Encounter for antineoplastic chemotherapy Chemotherapy plan as listed as above.  Normocytic anemia Stable Continue monitor.   Stage 3a chronic kidney disease (HCC) recommend patient to avoid nephrotoxin.  Encourage oral hydration.  No orders of the defined types were placed in this encounter.    Follow up  Lab/MD/Dara in 4 weeks  All questions were answered. The patient knows to call the clinic with any problems, questions or concerns.  Rickard Patience, MD, PhD [facility] Hematology Oncology 09/01/2023     HISTORY OF PRESENTING ILLNESS:   Stephanie Delacruz is a  89 y.o.  female presents for follow up of multiple myeloma.  Oncology History  Multiple myeloma (HCC)  08/22/2021 Initial Diagnosis   Multiple myeloma (HCC) IgA kappa Initial M protein 5.2, free kappa light chain ratio 89.2, Kappa/lambda ratio 13.31   08/22/2021 Cancer Staging   Staging form: Plasma Cell Myeloma and Plasma Cell Disorders, AJCC 8th Edition - Clinical stage from 08/22/2021: RISS Stage II (Beta-2-microglobulin (mg/L): 4.1, Albumin (g/dL): 2.8, ISS: Stage II, High-risk cytogenetics: Absent, LDH: Normal) - Signed by Rickard Patience, MD on 09/06/2021 Stage prefix: Initial diagnosis Beta 2 microglobulin range (mg/L): 3.5 to 5.49 Albumin range (g/dL): Less than 3.5 Cytogenetics: 1q addition   08/26/2021 - 09/01/2022 Chemotherapy   08/26/21 MYELOMA Daratumumab SQ q28d  09/06/2021 added Revlimid 10mg  x 14 days.  09/27/2021 starting cycle 2 Revlimid 10 mg 21 days on, 7 days off. Weekly Dexamethasone.    08/26/2021 Bone Marrow Biopsy   Bone marrow biopsy showed hypercellular marrow with plasma cell neoplasm. 67% plasma cells in the aspirate. Cytogenetics showed complex hyperplasia, gain of 1 q.    07/23/2022 Bone Marrow Biopsy   Bone marrow biopsy showed Hypercellular bone marrow for age with trilineage hematopoiesis and 3% plasma cells  Cytogenetics are normal.   04/20/2023 Imaging   PET scan showed  1. Patchy hypermetabolism throughout the spine with focal hypermetabolism in the right third rib and left sacrum, compatible with residual metabolically active multiple myeloma. 2. Hypermetabolic 5 mm right cervical lymph node, indeterminate.Recommend attention on follow-up. 3. 3.6 cm thyroid nodule, previously evaluated  by ultrasound 08/27/2022 and biopsy 10/07/2022. Please refer to pathology report for further evaluation. No follow-up recommended unless clinically warranted. (Ref: J Am Coll Radiol.  2015 Feb;12(2): 143-50). 4. Aortic atherosclerosis (ICD10-I70.0). Coronary artery calcification.    S/p thyroid lesion biopsy, obtained by endocrinologist. + AUS  INTERVAL HISTORY Stephanie Delacruz is a 89 y.o. female who has above history reviewed by me today presents for follow up visit for management of multiple myeloma She was accompanied by her daughter today.  She denies any nausea vomiting diarrhea.   No new complaints.   Review of Systems  Constitutional:  Positive for fatigue. Negative for appetite change, chills and fever.  HENT:   Negative for hearing loss and voice change.   Eyes:  Negative for eye problems.  Respiratory:  Negative for chest tightness and cough.   Cardiovascular:  Negative for chest pain.  Gastrointestinal:  Negative for abdominal distention, abdominal pain and blood in stool.  Endocrine: Negative for hot flashes.  Genitourinary:  Negative for difficulty urinating and frequency.   Musculoskeletal:  Positive for back pain. Negative for arthralgias.  Skin:  Negative for itching and rash.  Neurological:  Negative for extremity weakness.  Hematological:  Negative for adenopathy.  Psychiatric/Behavioral:  Negative for confusion.     MEDICAL HISTORY:  Past Medical History:  Diagnosis Date   Anemia    GERD (gastroesophageal reflux disease)    Hypercholesterolemia    Hypertension    Renal insufficiency     SURGICAL HISTORY: Past Surgical History:  Procedure Laterality Date   TONSILLECTOMY     TUBAL LIGATION      SOCIAL HISTORY: Social History   Socioeconomic History   Marital status: Married    Spouse name: Not on file   Number of children: Not on file   Years of education: Not on file   Highest education level: Not on file  Occupational History   Not on file  Tobacco Use   Smoking status: Never   Smokeless tobacco: Never  Vaping Use   Vaping status: Never Used  Substance and Sexual Activity   Alcohol use: No   Drug use: No   Sexual  activity: Not on file  Other Topics Concern   Not on file  Social History Narrative   Lives with husband Fayrene Fearing and daughter Marylene Land. No pets.    Social Drivers of Corporate investment banker Strain: Not on file  Food Insecurity: Not on file  Transportation Needs: Not on file  Physical Activity: Not on file  Stress: Not on file  Social Connections: Not on file  Intimate Partner Violence: Not on file    FAMILY HISTORY: Family History  Problem Relation Age of Onset   Hypertension Mother    Arthritis Mother    Hypertension Father    Stroke Father    Breast cancer Niece     ALLERGIES:  has no known allergies.  MEDICATIONS:  Current Outpatient Medications  Medication Sig Dispense Refill   acyclovir (ZOVIRAX) 400 MG tablet Take 1 tablet (400 mg total) by mouth 2 (two) times daily. 60 tablet 11   amLODipine (NORVASC) 2.5 MG tablet Take 1 tablet (2.5 mg total) by mouth daily.     ascorbic acid (VITAMIN C) 250 MG tablet daily Take 2 tablets every day by oral route.     aspirin 81 MG EC tablet Take 81 mg by mouth daily.     Calcium Carb-Cholecalciferol (CALCIUM 600+D) 600-20 MG-MCG TABS Take 1 tablet by mouth  3 (three) times daily.     dexamethasone (DECADRON) 4 MG tablet Take 5 tablets (20 mg total) by mouth See admin instructions. Take 20mg  prior to each Daratumumab treatment. 30 tablet 1   donepezil (ARICEPT) 10 MG tablet Take 5 mg by mouth at bedtime.     fenofibrate (TRICOR) 48 MG tablet TAKE 1 TABLET(48 MG) BY MOUTH DAILY 90 tablet 2   fexofenadine (ALLEGRA) 180 MG tablet Take by mouth.     hydrochlorothiazide (HYDRODIURIL) 25 MG tablet TAKE 1/2 TABLET BY MOUTH EVERY DAY 45 tablet 1   memantine (NAMENDA) 5 MG tablet Take 5 mg by mouth 2 (two) times daily.     methimazole (TAPAZOLE) 5 MG tablet Take 1 tablet (5 mg total) by mouth daily. 90 tablet 3   metoprolol succinate (TOPROL-XL) 25 MG 24 hr tablet Take 1 tablet (25 mg total) by mouth daily. 90 tablet 3   montelukast  (SINGULAIR) 10 MG tablet Take 1 tablet (10 mg total) by mouth See admin instructions. Take 1 tablet one day before Daratumumab injection. 60 tablet 0   Multiple Vitamins-Minerals (WOMENS MULTIVITAMIN PO) Take 1 tablet by mouth daily.     omeprazole (PRILOSEC) 20 MG capsule TAKE 1 CAPSULE(20 MG) BY MOUTH DAILY 90 capsule 3   prochlorperazine (COMPAZINE) 10 MG tablet Take 10 mg by mouth every 6 (six) hours as needed for nausea or vomiting.     valsartan (DIOVAN) 160 MG tablet TAKE 1 TABLET(160 MG) BY MOUTH DAILY 90 tablet 1   No current facility-administered medications for this visit.     PHYSICAL EXAMINATION: ECOG PERFORMANCE STATUS: 1 - Symptomatic but completely ambulatory Vitals:   09/01/23 0902 09/01/23 0904  BP: (!) 156/57 (!) 148/71  Pulse: 64 65  Resp: 18   Temp: 98.3 F (36.8 C)    Filed Weights   09/01/23 0902  Weight: 151 lb 3.2 oz (68.6 kg)    Physical Exam Constitutional:      General: She is not in acute distress. HENT:     Head: Normocephalic and atraumatic.  Eyes:     General: No scleral icterus. Cardiovascular:     Rate and Rhythm: Normal rate and regular rhythm.     Heart sounds: Normal heart sounds.  Pulmonary:     Effort: Pulmonary effort is normal. No respiratory distress.     Breath sounds: No wheezing.  Abdominal:     General: Bowel sounds are normal. There is no distension.     Palpations: Abdomen is soft.  Musculoskeletal:        General: No deformity. Normal range of motion.     Cervical back: Normal range of motion and neck supple.  Skin:    General: Skin is warm and dry.     Findings: No erythema or rash.  Neurological:     Mental Status: She is alert and oriented to person, place, and time. Mental status is at baseline.     Cranial Nerves: No cranial nerve deficit.     Coordination: Coordination normal.  Psychiatric:        Mood and Affect: Mood normal.     LABORATORY DATA:  I have reviewed the data as listed    Latest Ref Rng &  Units 09/01/2023    8:42 AM 08/04/2023    1:02 PM 07/07/2023    1:14 PM  CBC  WBC 4.0 - 10.5 K/uL 5.5  6.0  6.2   Hemoglobin 12.0 - 15.0 g/dL 95.6  21.3  08.6  Hematocrit 36.0 - 46.0 % 31.8  33.7  31.7   Platelets 150 - 400 K/uL 208  261  240       Latest Ref Rng & Units 09/01/2023    8:42 AM 08/04/2023    1:02 PM 07/07/2023    1:14 PM  CMP  Glucose 70 - 99 mg/dL 97  914  782   BUN 8 - 23 mg/dL 16  15  22    Creatinine 0.44 - 1.00 mg/dL 9.56  2.13  0.86   Sodium 135 - 145 mmol/L 140  141  141   Potassium 3.5 - 5.1 mmol/L 3.7  3.9  4.1   Chloride 98 - 111 mmol/L 106  107  109   CO2 22 - 32 mmol/L 23  23  23    Calcium 8.9 - 10.3 mg/dL 9.4  9.5  9.5   Total Protein 6.5 - 8.1 g/dL 6.5  6.8  6.7   Total Bilirubin 0.0 - 1.2 mg/dL 0.5  0.4  0.6   Alkaline Phos 38 - 126 U/L 42  45  40   AST 15 - 41 U/L 26  29  26    ALT 0 - 44 U/L 15  19  18       Iron/TIBC/Ferritin/ %Sat    Component Value Date/Time   IRON 54 07/24/2021 1212   TIBC 277 07/24/2021 1212   FERRITIN 93 07/24/2021 1212   IRONPCTSAT 20 07/24/2021 1212       RADIOGRAPHIC STUDIES: I have personally reviewed the radiological images as listed and agreed with the findings in the report. LONG TERM MONITOR (3-14 DAYS) Result Date: 07/02/2023 Patch Wear Time:  13 days and 17 hours (2024-11-13T11:46:29-0500 to 2024-11-27T04:48:29-0500) Patient had a min HR of 52 bpm, max HR of 113 bpm, and avg HR of 67 bpm. Predominant underlying rhythm was Sinus Rhythm. Isolated SVEs were rare (<1.0%), SVE Couplets were rare (<1.0%), and SVE Triplets were rare (<1.0%). Isolated VEs were rare (<1.0%), and no VE Couplets or VE Triplets were present. Ventricular Trigeminy was present. Conclusion Cardiac monitor showed no arrhythmias. Minimal heart rate 52, max heart rate 113, average heart rate 67. Overall benign cardiac monitor.   ECHOCARDIOGRAM COMPLETE Result Date: 06/30/2023    ECHOCARDIOGRAM REPORT   Patient Name:   Stephanie Delacruz Date of Exam:  06/29/2023 Medical Rec #:  [id number]    Height:       64.0 in Accession #:    [id number]   Weight:       146.7 lb Date of Birth:  [birth year]     BSA:          1.715 m Patient Age:    89 years     BP:           142/77 mmHg Patient Gender: F            HR:           61 bpm. Exam Location:  [facility] Procedure: 2D Echo, 3D Echo, Cardiac Doppler, Color Doppler and Strain Analysis Indications:    R55 Syncope  History:        Patient has no prior history of Echocardiogram examinations.                 Stroke, Arrythmias:Bradycardia, Signs/Symptoms:Syncope and                 Murmur; Risk Factors:Hypertension and Non-Smoker.  Sonographer:    Ilda Mori MHA, BS, RDCS Referring Phys: [phone] [facility]  [place]ETANG IMPRESSIONS  1. Left ventricular ejection fraction, by estimation, is 60 to 65%. The left ventricle has normal function. The left ventricle has no regional wall motion abnormalities. Left ventricular diastolic parameters are consistent with Grade I diastolic dysfunction (impaired relaxation). The average left ventricular global longitudinal strain is -21.2 %. The global longitudinal strain is normal.  2. Right ventricular systolic function is normal. The right ventricular size is normal. There is moderately elevated pulmonary artery systolic pressure. The estimated right ventricular systolic pressure is 51.8 mmHg.  3. Left atrial size was mild to moderately dilated.  4. The mitral valve is normal in structure. Mild to moderate mitral valve regurgitation.  5. The aortic valve is tricuspid. Aortic valve regurgitation is mild to moderate. Aortic valve sclerosis/calcification is present, without any evidence of aortic stenosis.  6. The inferior vena cava is normal in size with <50% respiratory variability, suggesting right atrial pressure of 8 mmHg. FINDINGS  Left Ventricle: Left ventricular ejection fraction, by estimation, is 60 to 65%. The left ventricle has normal function. The left ventricle has no regional wall  motion abnormalities. The average left ventricular global longitudinal strain is -21.2 %. The global longitudinal strain is normal. The left ventricular internal cavity size was normal in size. There is no left ventricular hypertrophy. Left ventricular diastolic parameters are consistent with Grade I diastolic dysfunction (impaired relaxation). Right Ventricle: The right ventricular size is normal. No increase in right ventricular wall thickness. Right ventricular systolic function is normal. There is moderately elevated pulmonary artery systolic pressure. The tricuspid regurgitant velocity is 3.31 m/s, and with an assumed right atrial pressure of 8 mmHg, the estimated right ventricular systolic pressure is 51.8 mmHg. Left Atrium: Left atrial size was mild to moderately dilated. Right Atrium: Right atrial size was normal in size. Pericardium: There is no evidence of pericardial effusion. Mitral Valve: The mitral valve is normal in structure. Mild to moderate mitral valve regurgitation. Tricuspid Valve: The tricuspid valve is normal in structure. Tricuspid valve regurgitation is mild. Aortic Valve: The aortic valve is tricuspid. Aortic valve regurgitation is mild to moderate. Aortic valve sclerosis/calcification is present, without any evidence of aortic stenosis. Pulmonic Valve: The pulmonic valve was normal in structure. Pulmonic valve regurgitation is mild to moderate. Aorta: The aortic root and ascending aorta are structurally normal, with no evidence of dilitation. Venous: The inferior vena cava is normal in size with less than 50% respiratory variability, suggesting right atrial pressure of 8 mmHg. IAS/Shunts: No atrial level shunt detected by color flow Doppler.  LEFT VENTRICLE PLAX 2D LVIDd:         3.20 cm Diastology LVIDs:         1.80 cm LV e' medial:    7.40 cm/s LV PW:         0.90 cm LV E/e' medial:  8.6 LV IVS:        0.90 cm LV e' lateral:   12.00 cm/s                        LV E/e' lateral: 5.3                          2D Longitudinal Strain                        2D Strain GLS Avg:     -21.2 %  3D Volume EF:                        3D EF:        65 %                        LV EDV:       154 ml                        LV ESV:       53 ml                        LV SV:        100 ml RIGHT VENTRICLE             IVC RV Basal diam:  3.20 cm     IVC diam: 2.10 cm RV Mid diam:    2.90 cm RV S prime:     13.40 cm/s TAPSE (M-mode): 2.9 cm LEFT ATRIUM             Index        RIGHT ATRIUM           Index LA diam:        4.00 cm 2.33 cm/m   RA Area:     13.40 cm LA Vol (A2C):   75.9 ml 44.26 ml/m  RA Volume:   28.80 ml  16.79 ml/m LA Vol (A4C):   64.6 ml 37.67 ml/m LA Biplane Vol: 71.7 ml 41.81 ml/m   AORTA Ao Root diam: 2.90 cm Ao Asc diam:  2.80 cm MITRAL VALVE               TRICUSPID VALVE MV Area (PHT): 3.48 cm    TR Peak grad:   43.8 mmHg MV Decel Time: 218 msec    TR Vmax:        331.00 cm/s MV E velocity: 63.40 cm/s MV A velocity: 72.80 cm/s MV E/A ratio:  0.87 Debbe Odea MD Electronically signed by Debbe Odea MD Signature Date/Time: 06/30/2023/9:43:40 AM    Final

## 2023-09-01 NOTE — Assessment & Plan Note (Signed)
 Stable. Continue monitor

## 2023-09-01 NOTE — Assessment & Plan Note (Signed)
Chemotherapy plan as listed as above. 

## 2023-09-01 NOTE — Assessment & Plan Note (Signed)
 04/20/23 PET scan whole body,  Patchy hypermetabolism throughout the spine with focal hypermetabolism in the right third rib and left sacrum, compatible with residual metabolically active multiple myeloma.  Continue Q3 months Zometa- next due April  2025 Recommend calcium 1200 mg daily.

## 2023-09-01 NOTE — Assessment & Plan Note (Signed)
recommend patient to avoid nephrotoxin.  Encourage oral hydration. 

## 2023-09-02 LAB — KAPPA/LAMBDA LIGHT CHAINS
Kappa free light chain: 7.5 mg/L (ref 3.3–19.4)
Kappa, lambda light chain ratio: 2.5 — ABNORMAL HIGH (ref 0.26–1.65)
Lambda free light chains: 3 mg/L — ABNORMAL LOW (ref 5.7–26.3)

## 2023-09-04 LAB — MULTIPLE MYELOMA PANEL, SERUM
Albumin SerPl Elph-Mcnc: 3.4 g/dL (ref 2.9–4.4)
Albumin/Glob SerPl: 1.5 (ref 0.7–1.7)
Alpha 1: 0.2 g/dL (ref 0.0–0.4)
Alpha2 Glob SerPl Elph-Mcnc: 0.7 g/dL (ref 0.4–1.0)
B-Globulin SerPl Elph-Mcnc: 1.1 g/dL (ref 0.7–1.3)
Gamma Glob SerPl Elph-Mcnc: 0.4 g/dL (ref 0.4–1.8)
Globulin, Total: 2.4 g/dL (ref 2.2–3.9)
IgA: 165 mg/dL (ref 64–422)
IgG (Immunoglobin G), Serum: 493 mg/dL — ABNORMAL LOW (ref 586–1602)
IgM (Immunoglobulin M), Srm: 18 mg/dL — ABNORMAL LOW (ref 26–217)
M Protein SerPl Elph-Mcnc: 0.1 g/dL — ABNORMAL HIGH
Total Protein ELP: 5.8 g/dL — ABNORMAL LOW (ref 6.0–8.5)

## 2023-09-17 ENCOUNTER — Other Ambulatory Visit: Payer: Self-pay | Admitting: Internal Medicine

## 2023-09-28 DIAGNOSIS — H2513 Age-related nuclear cataract, bilateral: Secondary | ICD-10-CM | POA: Diagnosis not present

## 2023-09-28 DIAGNOSIS — H47233 Glaucomatous optic atrophy, bilateral: Secondary | ICD-10-CM | POA: Diagnosis not present

## 2023-09-29 ENCOUNTER — Inpatient Hospital Stay: Payer: Medicare Other | Admitting: Oncology

## 2023-09-29 ENCOUNTER — Encounter: Payer: Self-pay | Admitting: Oncology

## 2023-09-29 ENCOUNTER — Inpatient Hospital Stay: Payer: Medicare Other

## 2023-09-29 ENCOUNTER — Inpatient Hospital Stay: Payer: Medicare Other | Attending: Oncology

## 2023-09-29 VITALS — BP 142/63 | HR 62 | Temp 97.5°F | Resp 20 | Wt 148.5 lb

## 2023-09-29 DIAGNOSIS — N1831 Chronic kidney disease, stage 3a: Secondary | ICD-10-CM

## 2023-09-29 DIAGNOSIS — Z79899 Other long term (current) drug therapy: Secondary | ICD-10-CM | POA: Diagnosis not present

## 2023-09-29 DIAGNOSIS — C9001 Multiple myeloma in remission: Secondary | ICD-10-CM | POA: Diagnosis not present

## 2023-09-29 DIAGNOSIS — C9000 Multiple myeloma not having achieved remission: Secondary | ICD-10-CM | POA: Diagnosis not present

## 2023-09-29 DIAGNOSIS — Z5112 Encounter for antineoplastic immunotherapy: Secondary | ICD-10-CM | POA: Diagnosis not present

## 2023-09-29 DIAGNOSIS — I700 Atherosclerosis of aorta: Secondary | ICD-10-CM | POA: Insufficient documentation

## 2023-09-29 DIAGNOSIS — Z823 Family history of stroke: Secondary | ICD-10-CM | POA: Insufficient documentation

## 2023-09-29 DIAGNOSIS — Z803 Family history of malignant neoplasm of breast: Secondary | ICD-10-CM | POA: Diagnosis not present

## 2023-09-29 DIAGNOSIS — Z9089 Acquired absence of other organs: Secondary | ICD-10-CM | POA: Diagnosis not present

## 2023-09-29 DIAGNOSIS — D649 Anemia, unspecified: Secondary | ICD-10-CM | POA: Insufficient documentation

## 2023-09-29 DIAGNOSIS — Z79624 Long term (current) use of inhibitors of nucleotide synthesis: Secondary | ICD-10-CM | POA: Insufficient documentation

## 2023-09-29 DIAGNOSIS — Z8249 Family history of ischemic heart disease and other diseases of the circulatory system: Secondary | ICD-10-CM | POA: Diagnosis not present

## 2023-09-29 DIAGNOSIS — Z5111 Encounter for antineoplastic chemotherapy: Secondary | ICD-10-CM | POA: Diagnosis not present

## 2023-09-29 DIAGNOSIS — I129 Hypertensive chronic kidney disease with stage 1 through stage 4 chronic kidney disease, or unspecified chronic kidney disease: Secondary | ICD-10-CM | POA: Insufficient documentation

## 2023-09-29 DIAGNOSIS — E7800 Pure hypercholesterolemia, unspecified: Secondary | ICD-10-CM | POA: Diagnosis not present

## 2023-09-29 DIAGNOSIS — M899 Disorder of bone, unspecified: Secondary | ICD-10-CM | POA: Diagnosis not present

## 2023-09-29 DIAGNOSIS — Z8261 Family history of arthritis: Secondary | ICD-10-CM | POA: Diagnosis not present

## 2023-09-29 DIAGNOSIS — K219 Gastro-esophageal reflux disease without esophagitis: Secondary | ICD-10-CM | POA: Insufficient documentation

## 2023-09-29 LAB — CBC WITH DIFFERENTIAL/PLATELET
Abs Immature Granulocytes: 0.02 10*3/uL (ref 0.00–0.07)
Basophils Absolute: 0 10*3/uL (ref 0.0–0.1)
Basophils Relative: 1 %
Eosinophils Absolute: 0.1 10*3/uL (ref 0.0–0.5)
Eosinophils Relative: 2 %
HCT: 32.6 % — ABNORMAL LOW (ref 36.0–46.0)
Hemoglobin: 10.8 g/dL — ABNORMAL LOW (ref 12.0–15.0)
Immature Granulocytes: 0 %
Lymphocytes Relative: 32 %
Lymphs Abs: 1.6 10*3/uL (ref 0.7–4.0)
MCH: 32.2 pg (ref 26.0–34.0)
MCHC: 33.1 g/dL (ref 30.0–36.0)
MCV: 97.3 fL (ref 80.0–100.0)
Monocytes Absolute: 0.2 10*3/uL (ref 0.1–1.0)
Monocytes Relative: 5 %
Neutro Abs: 3.1 10*3/uL (ref 1.7–7.7)
Neutrophils Relative %: 60 %
Platelets: 235 10*3/uL (ref 150–400)
RBC: 3.35 MIL/uL — ABNORMAL LOW (ref 3.87–5.11)
RDW: 12.3 % (ref 11.5–15.5)
WBC: 5 10*3/uL (ref 4.0–10.5)
nRBC: 0 % (ref 0.0–0.2)

## 2023-09-29 LAB — COMPREHENSIVE METABOLIC PANEL
ALT: 17 U/L (ref 0–44)
AST: 24 U/L (ref 15–41)
Albumin: 3.8 g/dL (ref 3.5–5.0)
Alkaline Phosphatase: 42 U/L (ref 38–126)
Anion gap: 9 (ref 5–15)
BUN: 18 mg/dL (ref 8–23)
CO2: 25 mmol/L (ref 22–32)
Calcium: 9.6 mg/dL (ref 8.9–10.3)
Chloride: 107 mmol/L (ref 98–111)
Creatinine, Ser: 1.03 mg/dL — ABNORMAL HIGH (ref 0.44–1.00)
GFR, Estimated: 53 mL/min — ABNORMAL LOW (ref >60)
Glucose, Bld: 93 mg/dL (ref 70–99)
Potassium: 3.7 mmol/L (ref 3.5–5.1)
Sodium: 141 mmol/L (ref 135–145)
Total Bilirubin: 0.4 mg/dL (ref 0.0–1.2)
Total Protein: 6.5 g/dL (ref 6.5–8.1)

## 2023-09-29 MED ORDER — DARATUMUMAB-HYALURONIDASE-FIHJ 1800-30000 MG-UT/15ML ~~LOC~~ SOLN
1800.0000 mg | Freq: Once | SUBCUTANEOUS | Status: AC
  Administered 2023-09-29: 1800 mg via SUBCUTANEOUS
  Filled 2023-09-29: qty 15

## 2023-09-29 MED ORDER — ACETAMINOPHEN 325 MG PO TABS
650.0000 mg | ORAL_TABLET | Freq: Once | ORAL | Status: AC
  Administered 2023-09-29: 650 mg via ORAL
  Filled 2023-09-29: qty 2

## 2023-09-29 MED ORDER — DIPHENHYDRAMINE HCL 25 MG PO CAPS
50.0000 mg | ORAL_CAPSULE | Freq: Once | ORAL | Status: AC
  Administered 2023-09-29: 50 mg via ORAL
  Filled 2023-09-29: qty 2

## 2023-09-29 NOTE — Assessment & Plan Note (Signed)
 Stable. Continue monitor

## 2023-09-29 NOTE — Assessment & Plan Note (Signed)
 04/20/23 PET scan whole body,  Patchy hypermetabolism throughout the spine with focal hypermetabolism in the right third rib and left sacrum, compatible with residual metabolically active multiple myeloma.  Continue Q3 months Zometa- next due April  2025 Recommend calcium 1200 mg daily.

## 2023-09-29 NOTE — Assessment & Plan Note (Signed)
recommend patient to avoid nephrotoxin.  Encourage oral hydration. 

## 2023-09-29 NOTE — Progress Notes (Signed)
 Hematology/Oncology Progress note [phone] [phone]     CHIEF COMPLAINTS/REASON FOR VISIT:  Follow-up for multiple myeloma  ASSESSMENT & PLAN:   Cancer Staging  Multiple myeloma (HCC) Staging form: Plasma Cell Myeloma and Plasma Cell Disorders, AJCC 8th Edition - Clinical stage from 08/22/2021: RISS Stage II (Beta-2-microglobulin (mg/L): 4.1, Albumin (g/dL): 2.8, ISS: Stage II, High-risk cytogenetics: Absent, LDH: Normal) - Signed by Rickard Patience, MD on 09/06/2021   Multiple myeloma (HCC) Multiple myeloma, IgA kappa, complex hyperploid gain of 1q, S/p Daratumumab Rd.   Revlimid D1-21,  07/23/22 bone marrow biopsy  3% plasma cell, no M protein.   Labs are reviewed and discussed with patient.  Lab Results  Component Value Date   MPROTEIN 0.1 (H) 09/01/2023   KPAFRELGTCHN 7.5 09/01/2023   LAMBDASER 3.0 (L) 09/01/2023   KAPLAMBRATIO 2.50 (H) 09/01/2023     M protein is stable. Stable light chain ratio.  Proceed with  Daratumumab monotherapy maintenance - she takes home supply of Dexamethasone prior to her Daratumumab.  On asprin 81mg  daily and Acyclovir 400mg  BID.   Encounter for antineoplastic chemotherapy Chemotherapy plan as listed as above.  Bone lesion 04/20/23 PET scan whole body,  Patchy hypermetabolism throughout the spine with focal hypermetabolism in the right third rib and left sacrum, compatible with residual metabolically active multiple myeloma.  Continue Q3 months Zometa- next due April  2025 Recommend calcium 1200 mg daily.  Normocytic anemia Stable Continue monitor.   Stage 3a chronic kidney disease (HCC) recommend patient to avoid nephrotoxin.  Encourage oral hydration.  No orders of the defined types were placed in this encounter.    Follow up  Lab/MD/Dara in 4 weeks  All questions were answered. The patient knows to call the clinic with any problems, questions or concerns.  Rickard Patience, MD, PhD [facility] Hematology  Oncology 09/29/2023     HISTORY OF PRESENTING ILLNESS:   Stephanie Delacruz is a  89 y.o.  female presents for follow up of multiple myeloma.  Oncology History  Multiple myeloma (HCC)  08/22/2021 Initial Diagnosis   Multiple myeloma (HCC) IgA kappa Initial M protein 5.2, free kappa light chain ratio 89.2, Kappa/lambda ratio 13.31   08/22/2021 Cancer Staging   Staging form: Plasma Cell Myeloma and Plasma Cell Disorders, AJCC 8th Edition - Clinical stage from 08/22/2021: RISS Stage II (Beta-2-microglobulin (mg/L): 4.1, Albumin (g/dL): 2.8, ISS: Stage II, High-risk cytogenetics: Absent, LDH: Normal) - Signed by Rickard Patience, MD on 09/06/2021 Stage prefix: Initial diagnosis Beta 2 microglobulin range (mg/L): 3.5 to 5.49 Albumin range (g/dL): Less than 3.5 Cytogenetics: 1q addition   08/26/2021 - 09/01/2022 Chemotherapy   08/26/21 MYELOMA Daratumumab SQ q28d  09/06/2021 added Revlimid 10mg  x 14 days.  09/27/2021 starting cycle 2 Revlimid 10 mg 21 days on, 7 days off. Weekly Dexamethasone.    08/26/2021 Bone Marrow Biopsy   Bone marrow biopsy showed hypercellular marrow with plasma cell neoplasm. 67% plasma cells in the aspirate. Cytogenetics showed complex hyperplasia, gain of 1 q.    07/23/2022 Bone Marrow Biopsy   Bone marrow biopsy showed Hypercellular bone marrow for age with trilineage hematopoiesis and 3% plasma cells  Cytogenetics are normal.   04/20/2023 Imaging   PET scan showed  1. Patchy hypermetabolism throughout the spine with focal hypermetabolism in the right third rib and left sacrum, compatible with residual metabolically active multiple myeloma. 2. Hypermetabolic 5 mm right cervical lymph node, indeterminate.Recommend attention on follow-up. 3. 3.6 cm thyroid nodule, previously evaluated by ultrasound  08/27/2022 and biopsy 10/07/2022. Please refer to pathology report for further evaluation. No follow-up recommended unless clinically warranted. (Ref: J Am Coll Radiol. 2015 Feb;12(2):  143-50). 4. Aortic atherosclerosis (ICD10-I70.0). Coronary artery calcification.    S/p thyroid lesion biopsy, obtained by endocrinologist. + AUS  INTERVAL HISTORY Stephanie Delacruz is a 89 y.o. female who has above history reviewed by me today presents for follow up visit for management of multiple myeloma She was accompanied by her daughter today.  She denies any nausea vomiting diarrhea.   No new complaints.   Review of Systems  Constitutional:  Positive for fatigue. Negative for appetite change, chills and fever.  HENT:   Negative for hearing loss and voice change.   Eyes:  Negative for eye problems.  Respiratory:  Negative for chest tightness and cough.   Cardiovascular:  Negative for chest pain.  Gastrointestinal:  Negative for abdominal distention, abdominal pain and blood in stool.  Endocrine: Negative for hot flashes.  Genitourinary:  Negative for difficulty urinating and frequency.   Musculoskeletal:  Positive for back pain. Negative for arthralgias.  Skin:  Negative for itching and rash.  Neurological:  Negative for extremity weakness.  Hematological:  Negative for adenopathy.  Psychiatric/Behavioral:  Negative for confusion.     MEDICAL HISTORY:  Past Medical History:  Diagnosis Date   Anemia    GERD (gastroesophageal reflux disease)    Hypercholesterolemia    Hypertension    Renal insufficiency     SURGICAL HISTORY: Past Surgical History:  Procedure Laterality Date   TONSILLECTOMY     TUBAL LIGATION      SOCIAL HISTORY: Social History   Socioeconomic History   Marital status: Married    Spouse name: Not on file   Number of children: Not on file   Years of education: Not on file   Highest education level: Not on file  Occupational History   Not on file  Tobacco Use   Smoking status: Never   Smokeless tobacco: Never  Vaping Use   Vaping status: Never Used  Substance and Sexual Activity   Alcohol use: No   Drug use: No   Sexual activity: Not on file   Other Topics Concern   Not on file  Social History Narrative   Lives with husband Fayrene Fearing and daughter Marylene Land. No pets.    Social Drivers of Corporate investment banker Strain: Not on file  Food Insecurity: Not on file  Transportation Needs: Not on file  Physical Activity: Not on file  Stress: Not on file  Social Connections: Not on file  Intimate Partner Violence: Not on file    FAMILY HISTORY: Family History  Problem Relation Age of Onset   Hypertension Mother    Arthritis Mother    Hypertension Father    Stroke Father    Breast cancer Niece     ALLERGIES:  has no known allergies.  MEDICATIONS:  Current Outpatient Medications  Medication Sig Dispense Refill   acyclovir (ZOVIRAX) 400 MG tablet Take 1 tablet (400 mg total) by mouth 2 (two) times daily. 60 tablet 11   amLODipine (NORVASC) 2.5 MG tablet Take 1 tablet (2.5 mg total) by mouth daily.     ascorbic acid (VITAMIN C) 250 MG tablet daily Take 2 tablets every day by oral route.     aspirin 81 MG EC tablet Take 81 mg by mouth daily.     Calcium Carb-Cholecalciferol (CALCIUM 600+D) 600-20 MG-MCG TABS Take 1 tablet by mouth 3 (three)  times daily.     dexamethasone (DECADRON) 4 MG tablet Take 5 tablets (20 mg total) by mouth See admin instructions. Take 20mg  prior to each Daratumumab treatment. 30 tablet 1   donepezil (ARICEPT) 10 MG tablet Take 5 mg by mouth at bedtime.     fenofibrate (TRICOR) 48 MG tablet TAKE 1 TABLET(48 MG) BY MOUTH DAILY 90 tablet 2   fexofenadine (ALLEGRA) 180 MG tablet Take by mouth.     hydrochlorothiazide (HYDRODIURIL) 25 MG tablet TAKE 1/2 TABLET BY MOUTH EVERY DAY 45 tablet 3   memantine (NAMENDA) 5 MG tablet Take 5 mg by mouth 2 (two) times daily.     methimazole (TAPAZOLE) 5 MG tablet Take 1 tablet (5 mg total) by mouth daily. 90 tablet 3   metoprolol succinate (TOPROL-XL) 25 MG 24 hr tablet Take 1 tablet (25 mg total) by mouth daily. 90 tablet 3   montelukast (SINGULAIR) 10 MG tablet Take  1 tablet (10 mg total) by mouth See admin instructions. Take 1 tablet one day before Daratumumab injection. 60 tablet 0   Multiple Vitamins-Minerals (WOMENS MULTIVITAMIN PO) Take 1 tablet by mouth daily.     omeprazole (PRILOSEC) 20 MG capsule TAKE 1 CAPSULE(20 MG) BY MOUTH DAILY 90 capsule 3   prochlorperazine (COMPAZINE) 10 MG tablet Take 10 mg by mouth every 6 (six) hours as needed for nausea or vomiting.     valsartan (DIOVAN) 160 MG tablet TAKE 1 TABLET(160 MG) BY MOUTH DAILY 90 tablet 1   No current facility-administered medications for this visit.   Facility-Administered Medications Ordered in Other Visits  Medication Dose Route Frequency Provider Last Rate Last Admin   acetaminophen (TYLENOL) tablet 650 mg  650 mg Oral Once Rickard Patience, MD       daratumumab-hyaluronidase-fihj [facility] FASPRO) 1800-30000 MG-UT/15ML chemo SQ injection 1,800 mg  1,800 mg Subcutaneous Once Rickard Patience, MD       diphenhydrAMINE (BENADRYL) capsule 50 mg  50 mg Oral Once Rickard Patience, MD         PHYSICAL EXAMINATION: ECOG PERFORMANCE STATUS: 1 - Symptomatic but completely ambulatory Vitals:   09/29/23 0856  BP: (!) 142/63  Pulse: 62  Resp: 20  Temp: (!) 97.5 F (36.4 C)  SpO2: 100%   Filed Weights   09/29/23 0856  Weight: 148 lb 8 oz (67.4 kg)    Physical Exam Constitutional:      General: She is not in acute distress. HENT:     Head: Normocephalic and atraumatic.  Eyes:     General: No scleral icterus. Cardiovascular:     Rate and Rhythm: Normal rate and regular rhythm.     Heart sounds: Normal heart sounds.  Pulmonary:     Effort: Pulmonary effort is normal. No respiratory distress.     Breath sounds: No wheezing.  Abdominal:     General: Bowel sounds are normal. There is no distension.     Palpations: Abdomen is soft.  Musculoskeletal:        General: No deformity. Normal range of motion.     Cervical back: Normal range of motion and neck supple.  Skin:    General: Skin is warm and dry.      Findings: No erythema or rash.  Neurological:     Mental Status: She is alert and oriented to person, place, and time. Mental status is at baseline.     Cranial Nerves: No cranial nerve deficit.     Coordination: Coordination normal.  Psychiatric:  Mood and Affect: Mood normal.     LABORATORY DATA:  I have reviewed the data as listed    Latest Ref Rng & Units 09/29/2023    8:40 AM 09/01/2023    8:42 AM 08/04/2023    1:02 PM  CBC  WBC 4.0 - 10.5 K/uL 5.0  5.5  6.0   Hemoglobin 12.0 - 15.0 g/dL 16.1  09.6  04.5   Hematocrit 36.0 - 46.0 % 32.6  31.8  33.7   Platelets 150 - 400 K/uL 235  208  261       Latest Ref Rng & Units 09/29/2023    8:40 AM 09/01/2023    8:42 AM 08/04/2023    1:02 PM  CMP  Glucose 70 - 99 mg/dL 93  97  409   BUN 8 - 23 mg/dL 18  16  15    Creatinine 0.44 - 1.00 mg/dL 8.11  9.14  7.82   Sodium 135 - 145 mmol/L 141  140  141   Potassium 3.5 - 5.1 mmol/L 3.7  3.7  3.9   Chloride 98 - 111 mmol/L 107  106  107   CO2 22 - 32 mmol/L 25  23  23    Calcium 8.9 - 10.3 mg/dL 9.6  9.4  9.5   Total Protein 6.5 - 8.1 g/dL 6.5  6.5  6.8   Total Bilirubin 0.0 - 1.2 mg/dL 0.4  0.5  0.4   Alkaline Phos 38 - 126 U/L 42  42  45   AST 15 - 41 U/L 24  26  29    ALT 0 - 44 U/L 17  15  19       Iron/TIBC/Ferritin/ %Sat    Component Value Date/Time   IRON 54 07/24/2021 1212   TIBC 277 07/24/2021 1212   FERRITIN 93 07/24/2021 1212   IRONPCTSAT 20 07/24/2021 1212       RADIOGRAPHIC STUDIES: I have personally reviewed the radiological images as listed and agreed with the findings in the report. LONG TERM MONITOR (3-14 DAYS) Result Date: 07/02/2023 Patch Wear Time:  13 days and 17 hours (2024-11-13T11:46:29-0500 to 2024-11-27T04:48:29-0500) Patient had a min HR of 52 bpm, max HR of 113 bpm, and avg HR of 67 bpm. Predominant underlying rhythm was Sinus Rhythm. Isolated SVEs were rare (<1.0%), SVE Couplets were rare (<1.0%), and SVE Triplets were rare (<1.0%). Isolated VEs  were rare (<1.0%), and no VE Couplets or VE Triplets were present. Ventricular Trigeminy was present. Conclusion Cardiac monitor showed no arrhythmias. Minimal heart rate 52, max heart rate 113, average heart rate 67. Overall benign cardiac monitor.

## 2023-09-29 NOTE — Patient Instructions (Signed)
 [facility] - A DEPT OF [facility]  Discharge Instructions: Thank you for choosing [facility] to provide your oncology and hematology care.  If you have a lab appointment with the Cancer Center, please go directly to the Cancer Center and check in at the registration area.  Wear comfortable clothing and clothing appropriate for easy access to any Portacath or PICC line.   We strive to give you quality time with your provider. You may need to reschedule your appointment if you arrive late (15 or more minutes).  Arriving late affects you and other patients whose appointments are after yours.  Also, if you miss three or more appointments without notifying the office, you may be dismissed from the clinic at the provider's discretion.      For prescription refill requests, have your pharmacy contact our office and allow 72 hours for refills to be completed.    Today you received the following chemotherapy and/or immunotherapy agents darzalex    To help prevent nausea and vomiting after your treatment, we encourage you to take your nausea medication as directed.  BELOW ARE SYMPTOMS THAT SHOULD BE REPORTED IMMEDIATELY: *FEVER GREATER THAN 100.4 F (38 C) OR HIGHER *CHILLS OR SWEATING *NAUSEA AND VOMITING THAT IS NOT CONTROLLED WITH YOUR NAUSEA MEDICATION *UNUSUAL SHORTNESS OF BREATH *UNUSUAL BRUISING OR BLEEDING *URINARY PROBLEMS (pain or burning when urinating, or frequent urination) *BOWEL PROBLEMS (unusual diarrhea, constipation, pain near the anus) TENDERNESS IN MOUTH AND THROAT WITH OR WITHOUT PRESENCE OF ULCERS (sore throat, sores in mouth, or a toothache) UNUSUAL RASH, SWELLING OR PAIN  UNUSUAL VAGINAL DISCHARGE OR ITCHING   Items with * indicate a potential emergency and should be followed up as soon as possible or go to the Emergency Department if any problems should occur.  Please show the CHEMOTHERAPY ALERT CARD or IMMUNOTHERAPY ALERT  CARD at check-in to the Emergency Department and triage nurse.  Should you have questions after your visit or need to cancel or reschedule your appointment, please contact [facility] - A DEPT OF [facility]  [phone] and follow the prompts.  Office hours are 8:00 a.m. to 4:30 p.m. Monday - Friday. Please note that voicemails left after 4:00 p.m. may not be returned until the following business day.  We are closed weekends and major holidays. You have access to a nurse at all times for urgent questions. Please call the main number to the clinic [phone] and follow the prompts.  For any non-urgent questions, you may also contact your provider using MyChart. We now offer e-Visits for anyone 100 and older to request care online for non-urgent symptoms. For details visit [email or web address].   Also download the MyChart app! Go to the app store, search "MyChart", open the app, select , and log in with your MyChart username and password.

## 2023-09-29 NOTE — Assessment & Plan Note (Signed)
Chemotherapy plan as listed as above. 

## 2023-09-29 NOTE — Assessment & Plan Note (Addendum)
 Multiple myeloma, IgA kappa, complex hyperploid gain of 1q, S/p Daratumumab Rd.   Revlimid D1-21,  07/23/22 bone marrow biopsy  3% plasma cell, no M protein.   Labs are reviewed and discussed with patient.  Lab Results  Component Value Date   MPROTEIN 0.1 (H) 09/01/2023   KPAFRELGTCHN 7.5 09/01/2023   LAMBDASER 3.0 (L) 09/01/2023   KAPLAMBRATIO 2.50 (H) 09/01/2023     M protein is stable. Stable light chain ratio.  Proceed with  Daratumumab monotherapy maintenance - she takes home supply of Dexamethasone prior to her Daratumumab.  On asprin 81mg  daily and Acyclovir 400mg  BID.

## 2023-09-30 LAB — KAPPA/LAMBDA LIGHT CHAINS
Kappa free light chain: 8.9 mg/L (ref 3.3–19.4)
Kappa, lambda light chain ratio: 2.23 — ABNORMAL HIGH (ref 0.26–1.65)
Lambda free light chains: 4 mg/L — ABNORMAL LOW (ref 5.7–26.3)

## 2023-10-01 LAB — MULTIPLE MYELOMA PANEL, SERUM
Albumin SerPl Elph-Mcnc: 3.6 g/dL (ref 2.9–4.4)
Albumin/Glob SerPl: 1.4 (ref 0.7–1.7)
Alpha 1: 0.3 g/dL (ref 0.0–0.4)
Alpha2 Glob SerPl Elph-Mcnc: 0.8 g/dL (ref 0.4–1.0)
B-Globulin SerPl Elph-Mcnc: 1.2 g/dL (ref 0.7–1.3)
Gamma Glob SerPl Elph-Mcnc: 0.4 g/dL (ref 0.4–1.8)
Globulin, Total: 2.6 g/dL (ref 2.2–3.9)
IgA: 161 mg/dL (ref 64–422)
IgG (Immunoglobin G), Serum: 491 mg/dL — ABNORMAL LOW (ref 586–1602)
IgM (Immunoglobulin M), Srm: 19 mg/dL — ABNORMAL LOW (ref 26–217)
Total Protein ELP: 6.2 g/dL (ref 6.0–8.5)

## 2023-10-08 ENCOUNTER — Other Ambulatory Visit: Payer: Self-pay | Admitting: Internal Medicine

## 2023-10-09 IMAGING — CT CT BIOPSY AND ASPIRATION BONE MARROW
1 of 2 series · 10 of 14 positions shown, 13 images · non-contrast
Comparison: none

INDICATION: Anemia and myeloma workup.

[Series 2: i-spiral 5.0 br38 · axial · 0.86mm/px · z∈[+977,+1023]mm · 10 of 17 slices shown, 13 images]
[im 2/17  soft-tissue]
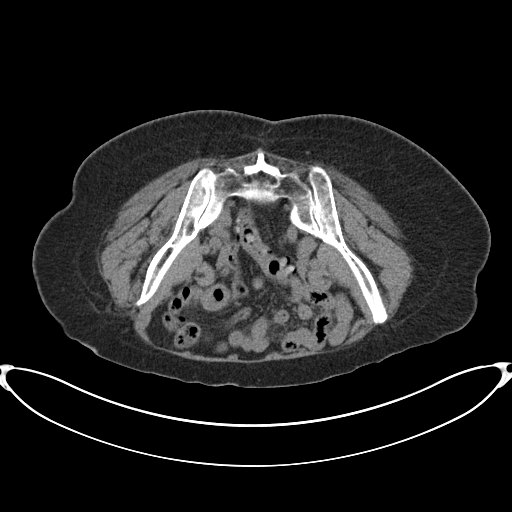
[im 2/17  bone]
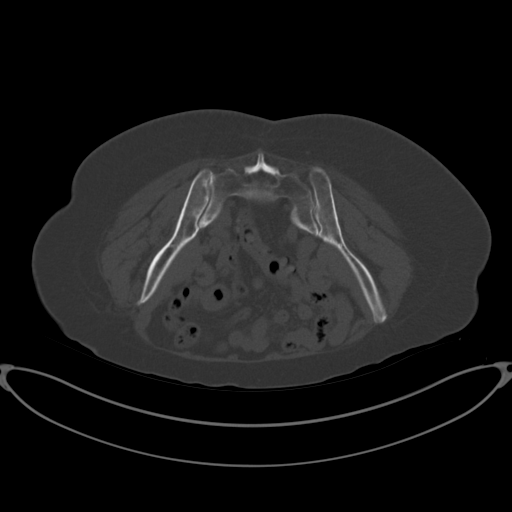
[im 3/17  bone]
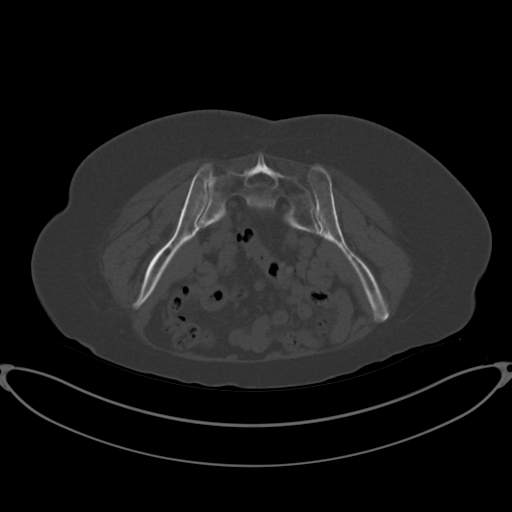
[im 5/17  bone]
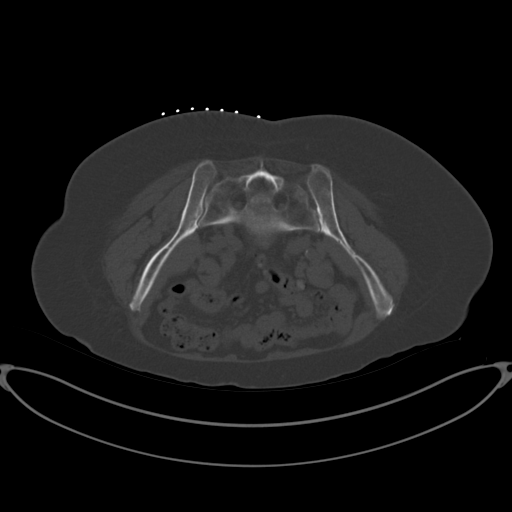
[im 6/17  bone]
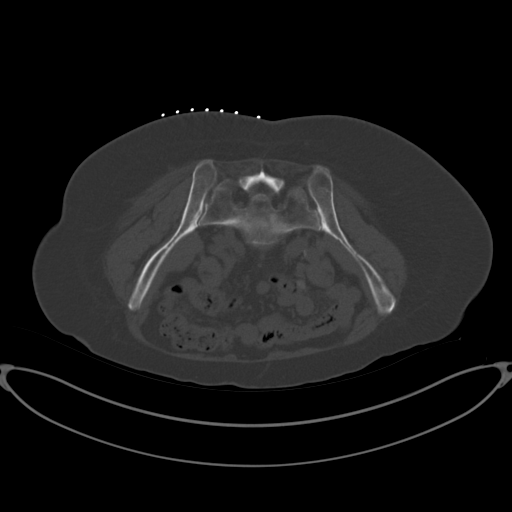
[im 8/17  soft-tissue]
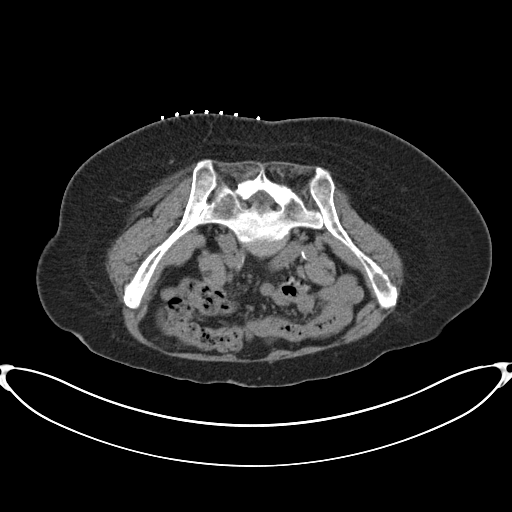
[im 8/17  bone]
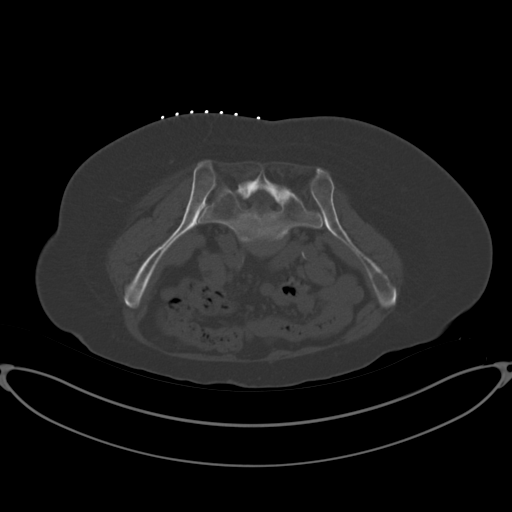
[im 9/17  bone]
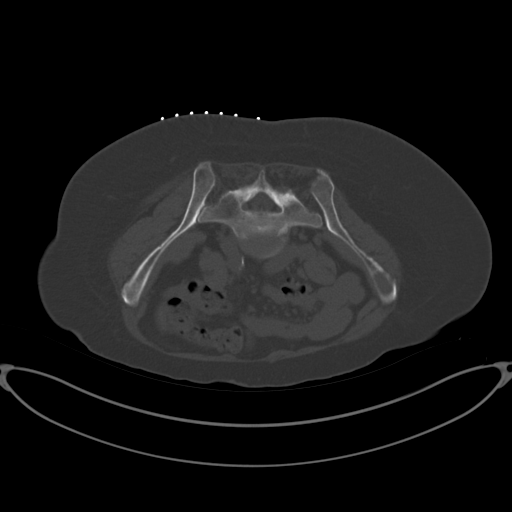
[im 11/17  bone]
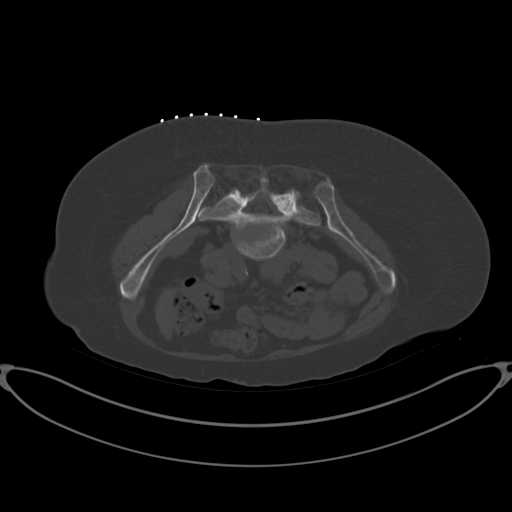
[im 12/17  bone]
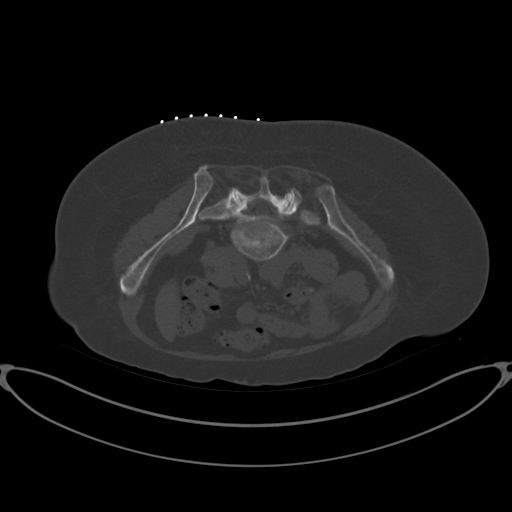
[im 14/17  soft-tissue]
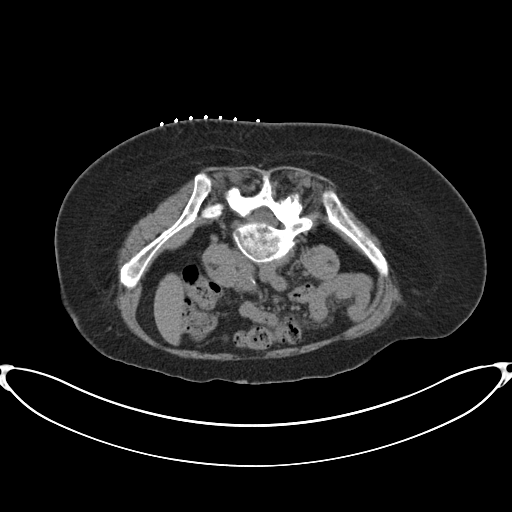
[im 14/17  bone]
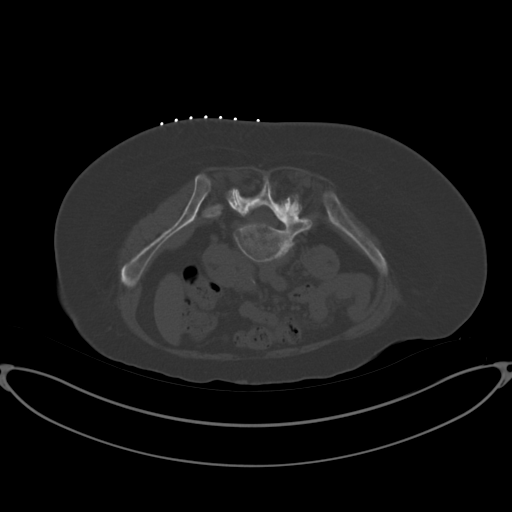
[im 15/17  bone]
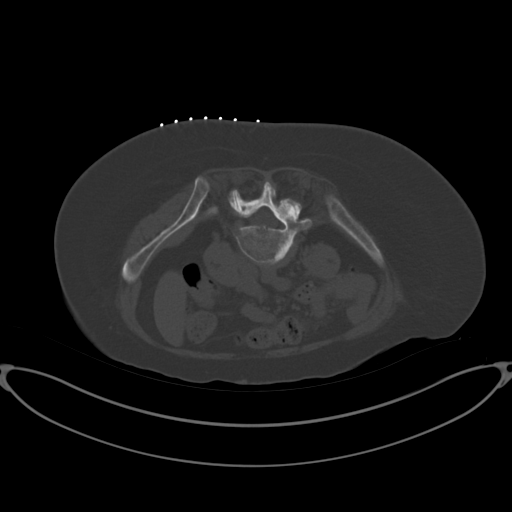

[10 of 14 positions shown; findings below may reference images not displayed]

EXAM:
CT GUIDED BONE MARROW ASPIRATES AND BIOPSY

MEDICATIONS:
Moderate sedation

ANESTHESIA/SEDATION:
Moderate (conscious) sedation was employed during this procedure. A
total of Versed 0.5mg and fentanyl 25 mcg was administered
intravenously at the order of the provider performing the procedure.

Total intra-service moderate sedation time: 12 minutes.

Patient's level of consciousness and vital signs were monitored
continuously by radiology nurse throughout the procedure under the
supervision of the provider performing the procedure.

COMPLICATIONS:
None immediate.

PROCEDURE:
The procedure was explained to the patient and patient's family. The
risks and benefits of the procedure were discussed and the patient's
questions were addressed. Informed consent was obtained from the
patient. The patient was placed prone on CT table. Images of the
pelvis were obtained. The right side of back was prepped and draped
in sterile fashion. The skin and right posterior ilium were
anesthetized with 1% lidocaine. 11 gauge bone needle was directed
into the right ilium with CT guidance. Two aspirates and one core
biopsy were obtained. Bandage placed over the puncture site.
FINDINGS: CT imaging through the pelvis demonstrates a low-density and
slightly expansile lesion at the left iliac wing that measures up to
2.0 cm. This is seen on sequence 2 image 10.

Biopsy needle was directed in the posterior right ilium.
IMPRESSION: 1. CT-guided bone marrow biopsy and aspiration.
2. Suspicious bone lesion in the left iliac wing. This lesion could
be associated with myeloma.

These results will be called to the ordering clinician or
representative by the Radiologist Assistant, and communication
documented in the PACS or [REDACTED].

## 2023-10-27 ENCOUNTER — Inpatient Hospital Stay: Payer: Medicare Other

## 2023-10-27 ENCOUNTER — Inpatient Hospital Stay (HOSPITAL_BASED_OUTPATIENT_CLINIC_OR_DEPARTMENT_OTHER): Payer: Medicare Other | Admitting: Oncology

## 2023-10-27 ENCOUNTER — Inpatient Hospital Stay: Payer: Medicare Other | Attending: Oncology

## 2023-10-27 ENCOUNTER — Encounter: Payer: Self-pay | Admitting: Oncology

## 2023-10-27 VITALS — BP 147/66 | HR 69 | Temp 97.0°F | Resp 19

## 2023-10-27 VITALS — BP 149/65 | HR 64 | Temp 97.9°F | Resp 18 | Wt 148.8 lb

## 2023-10-27 DIAGNOSIS — I700 Atherosclerosis of aorta: Secondary | ICD-10-CM | POA: Insufficient documentation

## 2023-10-27 DIAGNOSIS — N1831 Chronic kidney disease, stage 3a: Secondary | ICD-10-CM | POA: Diagnosis not present

## 2023-10-27 DIAGNOSIS — Z8261 Family history of arthritis: Secondary | ICD-10-CM | POA: Insufficient documentation

## 2023-10-27 DIAGNOSIS — Z823 Family history of stroke: Secondary | ICD-10-CM | POA: Diagnosis not present

## 2023-10-27 DIAGNOSIS — Z5112 Encounter for antineoplastic immunotherapy: Secondary | ICD-10-CM | POA: Insufficient documentation

## 2023-10-27 DIAGNOSIS — Z5111 Encounter for antineoplastic chemotherapy: Secondary | ICD-10-CM

## 2023-10-27 DIAGNOSIS — M899 Disorder of bone, unspecified: Secondary | ICD-10-CM

## 2023-10-27 DIAGNOSIS — D649 Anemia, unspecified: Secondary | ICD-10-CM | POA: Insufficient documentation

## 2023-10-27 DIAGNOSIS — Z9089 Acquired absence of other organs: Secondary | ICD-10-CM | POA: Diagnosis not present

## 2023-10-27 DIAGNOSIS — Z803 Family history of malignant neoplasm of breast: Secondary | ICD-10-CM | POA: Diagnosis not present

## 2023-10-27 DIAGNOSIS — R5383 Other fatigue: Secondary | ICD-10-CM | POA: Diagnosis not present

## 2023-10-27 DIAGNOSIS — M549 Dorsalgia, unspecified: Secondary | ICD-10-CM | POA: Insufficient documentation

## 2023-10-27 DIAGNOSIS — E7800 Pure hypercholesterolemia, unspecified: Secondary | ICD-10-CM | POA: Diagnosis not present

## 2023-10-27 DIAGNOSIS — C9000 Multiple myeloma not having achieved remission: Secondary | ICD-10-CM | POA: Diagnosis not present

## 2023-10-27 DIAGNOSIS — Z79620 Long term (current) use of immunosuppressive biologic: Secondary | ICD-10-CM | POA: Insufficient documentation

## 2023-10-27 DIAGNOSIS — I129 Hypertensive chronic kidney disease with stage 1 through stage 4 chronic kidney disease, or unspecified chronic kidney disease: Secondary | ICD-10-CM | POA: Insufficient documentation

## 2023-10-27 DIAGNOSIS — Z79899 Other long term (current) drug therapy: Secondary | ICD-10-CM | POA: Diagnosis not present

## 2023-10-27 DIAGNOSIS — C9001 Multiple myeloma in remission: Secondary | ICD-10-CM

## 2023-10-27 DIAGNOSIS — I2510 Atherosclerotic heart disease of native coronary artery without angina pectoris: Secondary | ICD-10-CM | POA: Diagnosis not present

## 2023-10-27 DIAGNOSIS — Z8249 Family history of ischemic heart disease and other diseases of the circulatory system: Secondary | ICD-10-CM | POA: Insufficient documentation

## 2023-10-27 LAB — CBC WITH DIFFERENTIAL/PLATELET
Abs Immature Granulocytes: 0.02 10*3/uL (ref 0.00–0.07)
Basophils Absolute: 0 10*3/uL (ref 0.0–0.1)
Basophils Relative: 1 %
Eosinophils Absolute: 0.1 10*3/uL (ref 0.0–0.5)
Eosinophils Relative: 1 %
HCT: 31.7 % — ABNORMAL LOW (ref 36.0–46.0)
Hemoglobin: 10.2 g/dL — ABNORMAL LOW (ref 12.0–15.0)
Immature Granulocytes: 0 %
Lymphocytes Relative: 19 %
Lymphs Abs: 1.1 10*3/uL (ref 0.7–4.0)
MCH: 31.3 pg (ref 26.0–34.0)
MCHC: 32.2 g/dL (ref 30.0–36.0)
MCV: 97.2 fL (ref 80.0–100.0)
Monocytes Absolute: 0.2 10*3/uL (ref 0.1–1.0)
Monocytes Relative: 3 %
Neutro Abs: 4.6 10*3/uL (ref 1.7–7.7)
Neutrophils Relative %: 76 %
Platelets: 211 10*3/uL (ref 150–400)
RBC: 3.26 MIL/uL — ABNORMAL LOW (ref 3.87–5.11)
RDW: 12.8 % (ref 11.5–15.5)
WBC: 6 10*3/uL (ref 4.0–10.5)
nRBC: 0 % (ref 0.0–0.2)

## 2023-10-27 LAB — COMPREHENSIVE METABOLIC PANEL WITH GFR
ALT: 17 U/L (ref 0–44)
AST: 25 U/L (ref 15–41)
Albumin: 3.8 g/dL (ref 3.5–5.0)
Alkaline Phosphatase: 43 U/L (ref 38–126)
Anion gap: 9 (ref 5–15)
BUN: 22 mg/dL (ref 8–23)
CO2: 23 mmol/L (ref 22–32)
Calcium: 9 mg/dL (ref 8.9–10.3)
Chloride: 106 mmol/L (ref 98–111)
Creatinine, Ser: 1.19 mg/dL — ABNORMAL HIGH (ref 0.44–1.00)
GFR, Estimated: 44 mL/min — ABNORMAL LOW (ref >60)
Glucose, Bld: 101 mg/dL — ABNORMAL HIGH (ref 70–99)
Potassium: 3.6 mmol/L (ref 3.5–5.1)
Sodium: 138 mmol/L (ref 135–145)
Total Bilirubin: 0.5 mg/dL (ref 0.0–1.2)
Total Protein: 6.6 g/dL (ref 6.5–8.1)

## 2023-10-27 MED ORDER — ZOLEDRONIC ACID 4 MG/5ML IV CONC
3.0000 mg | Freq: Once | INTRAVENOUS | Status: AC
  Administered 2023-10-27: 3 mg via INTRAVENOUS
  Filled 2023-10-27: qty 3.75

## 2023-10-27 MED ORDER — ACETAMINOPHEN 325 MG PO TABS
650.0000 mg | ORAL_TABLET | Freq: Once | ORAL | Status: AC
  Administered 2023-10-27: 650 mg via ORAL
  Filled 2023-10-27: qty 2

## 2023-10-27 MED ORDER — DARATUMUMAB-HYALURONIDASE-FIHJ 1800-30000 MG-UT/15ML ~~LOC~~ SOLN
1800.0000 mg | Freq: Once | SUBCUTANEOUS | Status: AC
  Administered 2023-10-27: 1800 mg via SUBCUTANEOUS
  Filled 2023-10-27: qty 15

## 2023-10-27 MED ORDER — DIPHENHYDRAMINE HCL 25 MG PO CAPS
50.0000 mg | ORAL_CAPSULE | Freq: Once | ORAL | Status: AC
  Administered 2023-10-27: 50 mg via ORAL
  Filled 2023-10-27: qty 2

## 2023-10-27 NOTE — Assessment & Plan Note (Signed)
recommend patient to avoid nephrotoxin.  Encourage oral hydration. 

## 2023-10-27 NOTE — Assessment & Plan Note (Signed)
 04/20/23 PET scan whole body,  Patchy hypermetabolism throughout the spine with focal hypermetabolism in the right third rib and left sacrum, compatible with residual metabolically active multiple myeloma.  Continue Q3 months Zometa- next due April  2025 Recommend calcium 1200 mg daily.

## 2023-10-27 NOTE — Progress Notes (Signed)
 Pt here for follow up. No new concerns voiced.

## 2023-10-27 NOTE — Progress Notes (Signed)
 Hematology/Oncology Progress note [phone] [phone]     CHIEF COMPLAINTS/REASON FOR VISIT:  Follow-up for multiple myeloma  ASSESSMENT & PLAN:   Cancer Staging  Multiple myeloma (HCC) Staging form: Plasma Cell Myeloma and Plasma Cell Disorders, AJCC 8th Edition - Clinical stage from 08/22/2021: RISS Stage II (Beta-2-microglobulin (mg/L): 4.1, Albumin (g/dL): 2.8, ISS: Stage II, High-risk cytogenetics: Absent, LDH: Normal) - Signed by Rickard Patience, MD on 09/06/2021   Multiple myeloma (HCC) Multiple myeloma, IgA kappa, complex hyperploid gain of 1q, S/p Daratumumab Rd.   Revlimid D1-21,  07/23/22 bone marrow biopsy  3% plasma cell, no M protein.   Labs are reviewed and discussed with patient.  Lab Results  Component Value Date   MPROTEIN Comment (A) 09/29/2023   KPAFRELGTCHN 8.9 09/29/2023   LAMBDASER 4.0 (L) 09/29/2023   KAPLAMBRATIO 2.23 (H) 09/29/2023     M protein is stable. Stable light chain ratio.  Proceed with  Daratumumab monotherapy maintenance - she takes home supply of Dexamethasone prior to her Daratumumab.  On asprin 81mg  daily and Acyclovir 400mg  BID.   Bone lesion 04/20/23 PET scan whole body,  Patchy hypermetabolism throughout the spine with focal hypermetabolism in the right third rib and left sacrum, compatible with residual metabolically active multiple myeloma.  Continue Q3 months Zometa- next due April  2025 Recommend calcium 1200 mg daily.  Encounter for antineoplastic chemotherapy Chemotherapy plan as listed as above.  Normocytic anemia Stable Continue monitor.   Stage 3a chronic kidney disease (HCC) recommend patient to avoid nephrotoxin.  Encourage oral hydration.  Orders Placed This Encounter  Procedures   Kappa/lambda light chains    Standing Status:   Future    Expected Date:   11/24/2023    Expiration Date:   11/23/2024   Multiple Myeloma Panel (SPEP&IFE w/QIG)    Standing Status:   Future    Expected Date:    11/24/2023    Expiration Date:   11/23/2024   Comprehensive metabolic panel    Standing Status:   Future    Expected Date:   11/24/2023    Expiration Date:   11/23/2024   CBC with Differential    Standing Status:   Future    Expected Date:   11/24/2023    Expiration Date:   11/23/2024   Kappa/lambda light chains    Standing Status:   Future    Expected Date:   12/22/2023    Expiration Date:   12/21/2024   Multiple Myeloma Panel (SPEP&IFE w/QIG)    Standing Status:   Future    Expected Date:   12/22/2023    Expiration Date:   12/21/2024   Comprehensive metabolic panel    Standing Status:   Future    Expected Date:   12/22/2023    Expiration Date:   12/21/2024   CBC with Differential    Standing Status:   Future    Expected Date:   12/22/2023    Expiration Date:   12/21/2024     Follow up  Lab/MD/Dara in 4 weeks  All questions were answered. The patient knows to call the clinic with any problems, questions or concerns.  Rickard Patience, MD, PhD [facility] Hematology Oncology 10/27/2023     HISTORY OF PRESENTING ILLNESS:   Stephanie Delacruz is a  89 y.o.  female presents for follow up of multiple myeloma.  Oncology History  Multiple myeloma (HCC)  08/22/2021 Initial Diagnosis   Multiple myeloma (HCC) IgA kappa Initial M protein 5.2, free  kappa light chain ratio 89.2, Kappa/lambda ratio 13.31   08/22/2021 Cancer Staging   Staging form: Plasma Cell Myeloma and Plasma Cell Disorders, AJCC 8th Edition - Clinical stage from 08/22/2021: RISS Stage II (Beta-2-microglobulin (mg/L): 4.1, Albumin (g/dL): 2.8, ISS: Stage II, High-risk cytogenetics: Absent, LDH: Normal) - Signed by Rickard Patience, MD on 09/06/2021 Stage prefix: Initial diagnosis Beta 2 microglobulin range (mg/L): 3.5 to 5.49 Albumin range (g/dL): Less than 3.5 Cytogenetics: 1q addition   08/26/2021 - 09/01/2022 Chemotherapy   08/26/21 MYELOMA Daratumumab SQ q28d  09/06/2021 added Revlimid 10mg  x 14 days.  09/27/2021 starting cycle 2 Revlimid 10  mg 21 days on, 7 days off. Weekly Dexamethasone.    08/26/2021 Bone Marrow Biopsy   Bone marrow biopsy showed hypercellular marrow with plasma cell neoplasm. 67% plasma cells in the aspirate. Cytogenetics showed complex hyperplasia, gain of 1 q.    07/23/2022 Bone Marrow Biopsy   Bone marrow biopsy showed Hypercellular bone marrow for age with trilineage hematopoiesis and 3% plasma cells  Cytogenetics are normal.   04/20/2023 Imaging   PET scan showed  1. Patchy hypermetabolism throughout the spine with focal hypermetabolism in the right third rib and left sacrum, compatible with residual metabolically active multiple myeloma. 2. Hypermetabolic 5 mm right cervical lymph node, indeterminate.Recommend attention on follow-up. 3. 3.6 cm thyroid nodule, previously evaluated by ultrasound 08/27/2022 and biopsy 10/07/2022. Please refer to pathology report for further evaluation. No follow-up recommended unless clinically warranted. (Ref: J Am Coll Radiol. 2015 Feb;12(2): 143-50). 4. Aortic atherosclerosis (ICD10-I70.0). Coronary artery calcification.    S/p thyroid lesion biopsy, obtained by endocrinologist. + AUS  INTERVAL HISTORY Cherre L Beggs is a 89 y.o. female who has above history reviewed by me today presents for follow up visit for management of multiple myeloma She was accompanied by her daughter today.  She denies any nausea vomiting diarrhea.   No new complaints.   Review of Systems  Constitutional:  Positive for fatigue. Negative for appetite change, chills and fever.  HENT:   Negative for hearing loss and voice change.   Eyes:  Negative for eye problems.  Respiratory:  Negative for chest tightness and cough.   Cardiovascular:  Negative for chest pain.  Gastrointestinal:  Negative for abdominal distention, abdominal pain and blood in stool.  Endocrine: Negative for hot flashes.  Genitourinary:  Negative for difficulty urinating and frequency.   Musculoskeletal:  Positive for  back pain. Negative for arthralgias.  Skin:  Negative for itching and rash.  Neurological:  Negative for extremity weakness.  Hematological:  Negative for adenopathy.  Psychiatric/Behavioral:  Negative for confusion.     MEDICAL HISTORY:  Past Medical History:  Diagnosis Date   Anemia    GERD (gastroesophageal reflux disease)    Hypercholesterolemia    Hypertension    Renal insufficiency     SURGICAL HISTORY: Past Surgical History:  Procedure Laterality Date   TONSILLECTOMY     TUBAL LIGATION      SOCIAL HISTORY: Social History   Socioeconomic History   Marital status: Married    Spouse name: Not on file   Number of children: Not on file   Years of education: Not on file   Highest education level: Not on file  Occupational History   Not on file  Tobacco Use   Smoking status: Never   Smokeless tobacco: Never  Vaping Use   Vaping status: Never Used  Substance and Sexual Activity   Alcohol use: No   Drug use: No  Sexual activity: Not on file  Other Topics Concern   Not on file  Social History Narrative   Lives with husband Fayrene Fearing and daughter Marylene Land. No pets.    Social Drivers of Corporate investment banker Strain: Not on file  Food Insecurity: Not on file  Transportation Needs: Not on file  Physical Activity: Not on file  Stress: Not on file  Social Connections: Not on file  Intimate Partner Violence: Not on file    FAMILY HISTORY: Family History  Problem Relation Age of Onset   Hypertension Mother    Arthritis Mother    Hypertension Father    Stroke Father    Breast cancer Niece     ALLERGIES:  has no known allergies.  MEDICATIONS:  Current Outpatient Medications  Medication Sig Dispense Refill   acyclovir (ZOVIRAX) 400 MG tablet Take 1 tablet (400 mg total) by mouth 2 (two) times daily. 60 tablet 11   amLODipine (NORVASC) 2.5 MG tablet Take 1 tablet (2.5 mg total) by mouth daily.     ascorbic acid (VITAMIN C) 250 MG tablet daily Take 2  tablets every day by oral route.     aspirin 81 MG EC tablet Take 81 mg by mouth daily.     Calcium Carb-Cholecalciferol (CALCIUM 600+D) 600-20 MG-MCG TABS Take 1 tablet by mouth 3 (three) times daily.     dexamethasone (DECADRON) 4 MG tablet Take 5 tablets (20 mg total) by mouth See admin instructions. Take 20mg  prior to each Daratumumab treatment. 30 tablet 1   donepezil (ARICEPT) 10 MG tablet Take 5 mg by mouth at bedtime.     fenofibrate (TRICOR) 48 MG tablet TAKE 1 TABLET(48 MG) BY MOUTH DAILY 90 tablet 2   fexofenadine (ALLEGRA) 180 MG tablet Take by mouth.     hydrochlorothiazide (HYDRODIURIL) 25 MG tablet TAKE 1/2 TABLET BY MOUTH EVERY DAY 45 tablet 3   memantine (NAMENDA) 5 MG tablet Take 5 mg by mouth 2 (two) times daily.     methimazole (TAPAZOLE) 5 MG tablet Take 1 tablet (5 mg total) by mouth daily. 90 tablet 3   metoprolol succinate (TOPROL-XL) 25 MG 24 hr tablet Take 1 tablet (25 mg total) by mouth daily. 90 tablet 3   montelukast (SINGULAIR) 10 MG tablet Take 1 tablet (10 mg total) by mouth See admin instructions. Take 1 tablet one day before Daratumumab injection. 60 tablet 0   Multiple Vitamins-Minerals (WOMENS MULTIVITAMIN PO) Take 1 tablet by mouth daily.     omeprazole (PRILOSEC) 20 MG capsule TAKE 1 CAPSULE(20 MG) BY MOUTH DAILY 90 capsule 3   prochlorperazine (COMPAZINE) 10 MG tablet Take 10 mg by mouth every 6 (six) hours as needed for nausea or vomiting.     valsartan (DIOVAN) 160 MG tablet TAKE 1 TABLET(160 MG) BY MOUTH DAILY 90 tablet 1   No current facility-administered medications for this visit.     PHYSICAL EXAMINATION: ECOG PERFORMANCE STATUS: 1 - Symptomatic but completely ambulatory Vitals:   10/27/23 0920  BP: (!) 149/65  Pulse: 64  Resp: 18  Temp: 97.9 F (36.6 C)  SpO2: 100%   Filed Weights   10/27/23 0920  Weight: 148 lb 12.8 oz (67.5 kg)    Physical Exam Constitutional:      General: She is not in acute distress. HENT:     Head:  Normocephalic and atraumatic.  Eyes:     General: No scleral icterus. Cardiovascular:     Rate and Rhythm: Normal rate and regular  rhythm.     Heart sounds: Normal heart sounds.  Pulmonary:     Effort: Pulmonary effort is normal. No respiratory distress.     Breath sounds: No wheezing.  Abdominal:     General: Bowel sounds are normal. There is no distension.     Palpations: Abdomen is soft.  Musculoskeletal:        General: No deformity. Normal range of motion.     Cervical back: Normal range of motion and neck supple.  Skin:    General: Skin is warm and dry.     Findings: No erythema or rash.  Neurological:     Mental Status: She is alert and oriented to person, place, and time. Mental status is at baseline.     Cranial Nerves: No cranial nerve deficit.     Coordination: Coordination normal.  Psychiatric:        Mood and Affect: Mood normal.     LABORATORY DATA:  I have reviewed the data as listed    Latest Ref Rng & Units 10/27/2023    9:05 AM 09/29/2023    8:40 AM 09/01/2023    8:42 AM  CBC  WBC 4.0 - 10.5 K/uL 6.0  5.0  5.5   Hemoglobin 12.0 - 15.0 g/dL 16.1  09.6  04.5   Hematocrit 36.0 - 46.0 % 31.7  32.6  31.8   Platelets 150 - 400 K/uL 211  235  208       Latest Ref Rng & Units 10/27/2023    9:05 AM 09/29/2023    8:40 AM 09/01/2023    8:42 AM  CMP  Glucose 70 - 99 mg/dL 409  93  97   BUN 8 - 23 mg/dL 22  18  16    Creatinine 0.44 - 1.00 mg/dL 8.11  9.14  7.82   Sodium 135 - 145 mmol/L 138  141  140   Potassium 3.5 - 5.1 mmol/L 3.6  3.7  3.7   Chloride 98 - 111 mmol/L 106  107  106   CO2 22 - 32 mmol/L 23  25  23    Calcium 8.9 - 10.3 mg/dL 9.0  9.6  9.4   Total Protein 6.5 - 8.1 g/dL 6.6  6.5  6.5   Total Bilirubin 0.0 - 1.2 mg/dL 0.5  0.4  0.5   Alkaline Phos 38 - 126 U/L 43  42  42   AST 15 - 41 U/L 25  24  26    ALT 0 - 44 U/L 17  17  15       Iron/TIBC/Ferritin/ %Sat    Component Value Date/Time   IRON 54 07/24/2021 1212   TIBC 277 07/24/2021 1212    FERRITIN 93 07/24/2021 1212   IRONPCTSAT 20 07/24/2021 1212       RADIOGRAPHIC STUDIES: I have personally reviewed the radiological images as listed and agreed with the findings in the report. No results found.

## 2023-10-27 NOTE — Assessment & Plan Note (Signed)
 Multiple myeloma, IgA kappa, complex hyperploid gain of 1q, S/p Daratumumab Rd.   Revlimid D1-21,  07/23/22 bone marrow biopsy  3% plasma cell, no M protein.   Labs are reviewed and discussed with patient.  Lab Results  Component Value Date   MPROTEIN Comment (A) 09/29/2023   KPAFRELGTCHN 8.9 09/29/2023   LAMBDASER 4.0 (L) 09/29/2023   KAPLAMBRATIO 2.23 (H) 09/29/2023     M protein is stable. Stable light chain ratio.  Proceed with  Daratumumab monotherapy maintenance - she takes home supply of Dexamethasone prior to her Daratumumab.  On asprin 81mg  daily and Acyclovir 400mg  BID.

## 2023-10-27 NOTE — Assessment & Plan Note (Signed)
Chemotherapy plan as listed as above. 

## 2023-10-27 NOTE — Assessment & Plan Note (Signed)
 Stable. Continue monitor

## 2023-10-27 NOTE — Patient Instructions (Signed)
 [facility] - A DEPT OF [facility]  Discharge Instructions: Thank you for choosing [facility] to provide your oncology and hematology care.  If you have a lab appointment with the Cancer Center, please go directly to the Cancer Center and check in at the registration area.  Wear comfortable clothing and clothing appropriate for easy access to any Portacath or PICC line.   We strive to give you quality time with your provider. You may need to reschedule your appointment if you arrive late (15 or more minutes).  Arriving late affects you and other patients whose appointments are after yours.  Also, if you miss three or more appointments without notifying the office, you may be dismissed from the clinic at the provider's discretion.      For prescription refill requests, have your pharmacy contact our office and allow 72 hours for refills to be completed.    Today you received the following chemotherapy and/or immunotherapy agents darzalex and zometa      To help prevent nausea and vomiting after your treatment, we encourage you to take your nausea medication as directed.  BELOW ARE SYMPTOMS THAT SHOULD BE REPORTED IMMEDIATELY: *FEVER GREATER THAN 100.4 F (38 C) OR HIGHER *CHILLS OR SWEATING *NAUSEA AND VOMITING THAT IS NOT CONTROLLED WITH YOUR NAUSEA MEDICATION *UNUSUAL SHORTNESS OF BREATH *UNUSUAL BRUISING OR BLEEDING *URINARY PROBLEMS (pain or burning when urinating, or frequent urination) *BOWEL PROBLEMS (unusual diarrhea, constipation, pain near the anus) TENDERNESS IN MOUTH AND THROAT WITH OR WITHOUT PRESENCE OF ULCERS (sore throat, sores in mouth, or a toothache) UNUSUAL RASH, SWELLING OR PAIN  UNUSUAL VAGINAL DISCHARGE OR ITCHING   Items with * indicate a potential emergency and should be followed up as soon as possible or go to the Emergency Department if any problems should occur.  Please show the CHEMOTHERAPY ALERT CARD or  IMMUNOTHERAPY ALERT CARD at check-in to the Emergency Department and triage nurse.  Should you have questions after your visit or need to cancel or reschedule your appointment, please contact [facility] - A DEPT OF [facility]  [phone] and follow the prompts.  Office hours are 8:00 a.m. to 4:30 p.m. Monday - Friday. Please note that voicemails left after 4:00 p.m. may not be returned until the following business day.  We are closed weekends and major holidays. You have access to a nurse at all times for urgent questions. Please call the main number to the clinic [phone] and follow the prompts.  For any non-urgent questions, you may also contact your provider using MyChart. We now offer e-Visits for anyone 78 and older to request care online for non-urgent symptoms. For details visit [email or web address].   Also download the MyChart app! Go to the app store, search "MyChart", open the app, select [facility], and log in with your MyChart username and password.

## 2023-10-28 LAB — KAPPA/LAMBDA LIGHT CHAINS
Kappa free light chain: 7.9 mg/L (ref 3.3–19.4)
Kappa, lambda light chain ratio: 2.39 — ABNORMAL HIGH (ref 0.26–1.65)
Lambda free light chains: 3.3 mg/L — ABNORMAL LOW (ref 5.7–26.3)

## 2023-10-29 ENCOUNTER — Ambulatory Visit: Payer: Medicare Other | Attending: Cardiology | Admitting: Cardiology

## 2023-10-29 ENCOUNTER — Encounter: Payer: Self-pay | Admitting: Cardiology

## 2023-10-29 VITALS — BP 130/63 | HR 68 | Ht 63.5 in | Wt 150.2 lb

## 2023-10-29 DIAGNOSIS — R55 Syncope and collapse: Secondary | ICD-10-CM

## 2023-10-29 DIAGNOSIS — I10 Essential (primary) hypertension: Secondary | ICD-10-CM

## 2023-10-29 DIAGNOSIS — R001 Bradycardia, unspecified: Secondary | ICD-10-CM | POA: Diagnosis not present

## 2023-10-29 NOTE — Patient Instructions (Signed)
 Medication Instructions:  Your Physician recommend you continue on your current medication as directed.     *If you need a refill on your cardiac medications before your next appointment, please call your pharmacy*  Lab Work: None today.   If you have labs (blood work) drawn today and your tests are completely normal, you will receive your results only by: MyChart Message (if you have MyChart) OR A paper copy in the mail If you have any lab test that is abnormal or we need to change your treatment, we will call you to review the results.  Testing/Procedures: None today.   Follow-Up: At [facility], you and your health needs are our priority.  As part of our continuing mission to provide you with exceptional heart care, our providers are all part of one team.  This team includes your primary Cardiologist (physician) and Advanced Practice Providers or APPs (Physician Assistants and Nurse Practitioners) who all work together to provide you with the care you need, when you need it.  Your next appointment:   6 month(s)  Provider:   You may see Debbe Odea, MD or one of the following Advanced Practice Providers on your designated Care Team:   Nicolasa Ducking, NP Ames Dura, PA-C Eula Listen, PA-C Cadence Howe, PA-C Charlsie Quest, NP Carlos Levering, NP    We recommend signing up for the patient portal called "MyChart".  Sign up information is provided on this After Visit Summary.  MyChart is used to connect with patients for Virtual Visits (Telemedicine).  Patients are able to view lab/test results, encounter notes, upcoming appointments, etc.  Non-urgent messages can be sent to your provider as well.   To learn more about what you can do with MyChart, go to [email or web address].

## 2023-10-29 NOTE — Progress Notes (Signed)
 Cardiology Office Note:    Date:  10/29/2023   ID:  EGYPT WELCOME, DOB [birth year], MRN [id number]  PCP:  Dale [place], MD   [facility] Providers Cardiologist:  Debbe Odea, MD     Referring MD: Dale , MD   No chief complaint on file.   History of Present Illness:    Stephanie Delacruz is a 89 y.o. female with a hx of hypertension, multiple myeloma, CKD, presenting for follow-up.    Initially seen due to symptoms of presyncope and bradycardia with heart rates in the 50s.  Toprol-XL was reduced to 25 mg daily with good effect.  BP was running on the low side, systolics in the 100s.  Amlodipine reduced to 2.5 mg daily.  She states her symptoms overall have improved, no further episodes of dizziness or presyncope.  Systolic blood pressure at home ranges in the 130s to 140s.  Patient denies any concerns today.  Prior notes/testing Cardiac monitor 06/2023 average heart rate 67, range 52-113.  No significant arrhythmias. Echo 06/2023 EF 60 to 65%  Past Medical History:  Diagnosis Date   Anemia    GERD (gastroesophageal reflux disease)    Hypercholesterolemia    Hypertension    Renal insufficiency     Past Surgical History:  Procedure Laterality Date   TONSILLECTOMY     TUBAL LIGATION      Current Medications: Current Meds  Medication Sig   acyclovir (ZOVIRAX) 400 MG tablet Take 1 tablet (400 mg total) by mouth 2 (two) times daily.   amLODipine (NORVASC) 2.5 MG tablet Take 1 tablet (2.5 mg total) by mouth daily.   ascorbic acid (VITAMIN C) 250 MG tablet daily Take 2 tablets every day by oral route.   aspirin 81 MG EC tablet Take 81 mg by mouth daily.   Calcium Carb-Cholecalciferol (CALCIUM 600+D) 600-20 MG-MCG TABS Take 1 tablet by mouth 3 (three) times daily.   dexamethasone (DECADRON) 4 MG tablet Take 5 tablets (20 mg total) by mouth See admin instructions. Take 20mg  prior to each Daratumumab treatment.   donepezil (ARICEPT) 10 MG tablet Take 5 mg by  mouth at bedtime.   fenofibrate (TRICOR) 48 MG tablet TAKE 1 TABLET(48 MG) BY MOUTH DAILY   fexofenadine (ALLEGRA) 180 MG tablet Take by mouth.   hydrochlorothiazide (HYDRODIURIL) 25 MG tablet TAKE 1/2 TABLET BY MOUTH EVERY DAY   memantine (NAMENDA) 5 MG tablet Take 5 mg by mouth 2 (two) times daily.   methimazole (TAPAZOLE) 5 MG tablet Take 1 tablet (5 mg total) by mouth daily.   metoprolol succinate (TOPROL-XL) 25 MG 24 hr tablet Take 1 tablet (25 mg total) by mouth daily.   montelukast (SINGULAIR) 10 MG tablet Take 1 tablet (10 mg total) by mouth See admin instructions. Take 1 tablet one day before Daratumumab injection.   Multiple Vitamins-Minerals (WOMENS MULTIVITAMIN PO) Take 1 tablet by mouth daily.   omeprazole (PRILOSEC) 20 MG capsule TAKE 1 CAPSULE(20 MG) BY MOUTH DAILY   prochlorperazine (COMPAZINE) 10 MG tablet Take 10 mg by mouth every 6 (six) hours as needed for nausea or vomiting.   valsartan (DIOVAN) 160 MG tablet TAKE 1 TABLET(160 MG) BY MOUTH DAILY     Allergies:   Patient has no known allergies.   Social History   Socioeconomic History   Marital status: Married    Spouse name: Not on file   Number of children: Not on file   Years of education: Not on file   Highest  education level: Not on file  Occupational History   Not on file  Tobacco Use   Smoking status: Never   Smokeless tobacco: Never  Vaping Use   Vaping status: Never Used  Substance and Sexual Activity   Alcohol use: No   Drug use: No   Sexual activity: Not on file  Other Topics Concern   Not on file  Social History Narrative   Lives with husband Fayrene Fearing and daughter Marylene Land. No pets.    Social Drivers of Corporate investment banker Strain: Not on file  Food Insecurity: Not on file  Transportation Needs: Not on file  Physical Activity: Not on file  Stress: Not on file  Social Connections: Not on file     Family History: The patient's family history includes Arthritis in her mother; Breast  cancer in her niece; Hypertension in her father and mother; Stroke in her father.  ROS:   Please see the history of present illness.     All other systems reviewed and are negative.  EKGs/Labs/Other Studies Reviewed:    The following studies were reviewed today:  EKG Interpretation Date/Time:  Thursday October 29 2023 09:09:50 EDT Ventricular Rate:  68 PR Interval:  210 QRS Duration:  80 QT Interval:  406 QTC Calculation: 431 R Axis:   -18  Text Interpretation: Sinus rhythm with 1st degree A-V block Minimal voltage criteria for LVH, may be normal variant ( R in aVL ) Confirmed by Debbe Odea ([id number]) on 10/29/2023 9:13:58 AM    Recent Labs: 07/01/2023: TSH 2.12 10/27/2023: ALT 17; BUN 22; Creatinine, Ser 1.19; Hemoglobin 10.2; Platelets 211; Potassium 3.6; Sodium 138  Recent Lipid Panel    Component Value Date/Time   CHOL 237 (H) 07/31/2023 1412   TRIG 57 07/31/2023 1412   HDL 89 07/31/2023 1412   CHOLHDL 2.7 07/31/2023 1412   VLDL 8 04/14/2023 0837   LDLCALC 134 (H) 07/31/2023 1412     Risk Assessment/Calculations:               Physical Exam:    VS:  BP 130/63 (BP Location: Left Arm, Patient Position: Sitting, Cuff Size: Normal)   Pulse 68   Ht 5' 3.5" (1.613 m)   Wt 150 lb 3.2 oz (68.1 kg)   SpO2 99%   BMI 26.19 kg/m     Wt Readings from Last 3 Encounters:  10/29/23 150 lb 3.2 oz (68.1 kg)  10/27/23 148 lb 12.8 oz (67.5 kg)  09/29/23 148 lb 8 oz (67.4 kg)     GEN:  Well nourished, well developed in no acute distress HEENT: Normal NECK: No JVD; No carotid bruits CARDIAC: RRR, no murmurs, rubs, gallops RESPIRATORY:  Clear to auscultation without rales, wheezing or rhonchi  ABDOMEN: Soft, non-tender, non-distended MUSCULOSKELETAL:  No edema; No deformity  SKIN: Warm and dry NEUROLOGIC:  Alert and oriented x 3 PSYCHIATRIC:  Normal affect   ASSESSMENT:    1. Syncope and collapse   2. Bradycardia   3. Primary hypertension    PLAN:    In order  of problems listed above:  syncope, etiology unclear.  History of bradycardia and low normal BP.  Symptoms overall improved/resolved with reducing Toprol-XL and Norvasc.  Cardiac monitor and echocardiogram were unrevealing.   History of sinus bradycardia, resolved with reducing Toprol-XL.  Continue Toprol-XL 25 mg daily.  Hypertension, BP controlled systolic in 130s.   Continue Diovan 160 mg daily, Norvasc 2.5 mg daily, HCTZ, metoprolol.  Okay to let BP  run high up to 150s okay given history of syncope and patient's age.  Follow-up in 6 months.    Medication Adjustments/Labs and Tests Ordered: Current medicines are reviewed at length with the patient today.  Concerns regarding medicines are outlined above.  Orders Placed This Encounter  Procedures   EKG 12-Lead   No orders of the defined types were placed in this encounter.   Patient Instructions  Medication Instructions:  Your Physician recommend you continue on your current medication as directed.     *If you need a refill on your cardiac medications before your next appointment, please call your pharmacy*  Lab Work: None today.   If you have labs (blood work) drawn today and your tests are completely normal, you will receive your results only by: MyChart Message (if you have MyChart) OR A paper copy in the mail If you have any lab test that is abnormal or we need to change your treatment, we will call you to review the results.  Testing/Procedures: None today.   Follow-Up: At [facility], you and your health needs are our priority.  As part of our continuing mission to provide you with exceptional heart care, our providers are all part of one team.  This team includes your primary Cardiologist (physician) and Advanced Practice Providers or APPs (Physician Assistants and Nurse Practitioners) who all work together to provide you with the care you need, when you need it.  Your next appointment:   6  month(s)  Provider:   You may see Debbe Odea, MD or one of the following Advanced Practice Providers on your designated Care Team:   Nicolasa Ducking, NP Ames Dura, PA-C Eula Listen, PA-C Cadence [place], PA-C Charlsie Quest, NP Carlos Levering, NP    We recommend signing up for the patient portal called "MyChart".  Sign up information is provided on this After Visit Summary.  MyChart is used to connect with patients for Virtual Visits (Telemedicine).  Patients are able to view lab/test results, encounter notes, upcoming appointments, etc.  Non-urgent messages can be sent to your provider as well.   To learn more about what you can do with MyChart, go to [email or web address].            Signed, Debbe Odea, MD  10/29/2023 9:49 AM    [facility]

## 2023-10-30 DIAGNOSIS — K080 Exfoliation of teeth due to systemic causes: Secondary | ICD-10-CM | POA: Diagnosis not present

## 2023-10-30 LAB — MULTIPLE MYELOMA PANEL, SERUM
Albumin SerPl Elph-Mcnc: 3.6 g/dL (ref 2.9–4.4)
Albumin/Glob SerPl: 1.4 (ref 0.7–1.7)
Alpha 1: 0.3 g/dL (ref 0.0–0.4)
Alpha2 Glob SerPl Elph-Mcnc: 0.8 g/dL (ref 0.4–1.0)
B-Globulin SerPl Elph-Mcnc: 1.2 g/dL (ref 0.7–1.3)
Gamma Glob SerPl Elph-Mcnc: 0.4 g/dL (ref 0.4–1.8)
Globulin, Total: 2.6 g/dL (ref 2.2–3.9)
IgA: 174 mg/dL (ref 64–422)
IgG (Immunoglobin G), Serum: 531 mg/dL — ABNORMAL LOW (ref 586–1602)
IgM (Immunoglobulin M), Srm: 21 mg/dL — ABNORMAL LOW (ref 26–217)
M Protein SerPl Elph-Mcnc: 0.1 g/dL — ABNORMAL HIGH
Total Protein ELP: 6.2 g/dL (ref 6.0–8.5)

## 2023-11-18 DIAGNOSIS — C9000 Multiple myeloma not having achieved remission: Secondary | ICD-10-CM | POA: Diagnosis not present

## 2023-11-18 DIAGNOSIS — F0390 Unspecified dementia without behavioral disturbance: Secondary | ICD-10-CM | POA: Diagnosis not present

## 2023-11-18 DIAGNOSIS — R413 Other amnesia: Secondary | ICD-10-CM | POA: Diagnosis not present

## 2023-11-18 DIAGNOSIS — R55 Syncope and collapse: Secondary | ICD-10-CM | POA: Diagnosis not present

## 2023-11-22 ENCOUNTER — Other Ambulatory Visit: Payer: Self-pay | Admitting: Internal Medicine

## 2023-11-24 ENCOUNTER — Inpatient Hospital Stay (HOSPITAL_BASED_OUTPATIENT_CLINIC_OR_DEPARTMENT_OTHER): Admitting: Oncology

## 2023-11-24 ENCOUNTER — Encounter: Payer: Self-pay | Admitting: Oncology

## 2023-11-24 ENCOUNTER — Inpatient Hospital Stay

## 2023-11-24 VITALS — BP 154/66 | HR 62 | Temp 98.7°F | Resp 18 | Wt 149.1 lb

## 2023-11-24 DIAGNOSIS — Z79620 Long term (current) use of immunosuppressive biologic: Secondary | ICD-10-CM | POA: Diagnosis not present

## 2023-11-24 DIAGNOSIS — M899 Disorder of bone, unspecified: Secondary | ICD-10-CM | POA: Diagnosis not present

## 2023-11-24 DIAGNOSIS — Z9089 Acquired absence of other organs: Secondary | ICD-10-CM | POA: Diagnosis not present

## 2023-11-24 DIAGNOSIS — C9000 Multiple myeloma not having achieved remission: Secondary | ICD-10-CM

## 2023-11-24 DIAGNOSIS — D649 Anemia, unspecified: Secondary | ICD-10-CM

## 2023-11-24 DIAGNOSIS — N1831 Chronic kidney disease, stage 3a: Secondary | ICD-10-CM | POA: Diagnosis not present

## 2023-11-24 DIAGNOSIS — Z803 Family history of malignant neoplasm of breast: Secondary | ICD-10-CM | POA: Diagnosis not present

## 2023-11-24 DIAGNOSIS — I700 Atherosclerosis of aorta: Secondary | ICD-10-CM | POA: Diagnosis not present

## 2023-11-24 DIAGNOSIS — I129 Hypertensive chronic kidney disease with stage 1 through stage 4 chronic kidney disease, or unspecified chronic kidney disease: Secondary | ICD-10-CM | POA: Diagnosis not present

## 2023-11-24 DIAGNOSIS — Z5111 Encounter for antineoplastic chemotherapy: Secondary | ICD-10-CM | POA: Diagnosis not present

## 2023-11-24 DIAGNOSIS — I2510 Atherosclerotic heart disease of native coronary artery without angina pectoris: Secondary | ICD-10-CM | POA: Diagnosis not present

## 2023-11-24 DIAGNOSIS — R5383 Other fatigue: Secondary | ICD-10-CM | POA: Diagnosis not present

## 2023-11-24 DIAGNOSIS — E7800 Pure hypercholesterolemia, unspecified: Secondary | ICD-10-CM | POA: Diagnosis not present

## 2023-11-24 DIAGNOSIS — Z8249 Family history of ischemic heart disease and other diseases of the circulatory system: Secondary | ICD-10-CM | POA: Diagnosis not present

## 2023-11-24 DIAGNOSIS — Z823 Family history of stroke: Secondary | ICD-10-CM | POA: Diagnosis not present

## 2023-11-24 DIAGNOSIS — Z8261 Family history of arthritis: Secondary | ICD-10-CM | POA: Diagnosis not present

## 2023-11-24 DIAGNOSIS — Z79899 Other long term (current) drug therapy: Secondary | ICD-10-CM | POA: Diagnosis not present

## 2023-11-24 DIAGNOSIS — M549 Dorsalgia, unspecified: Secondary | ICD-10-CM | POA: Diagnosis not present

## 2023-11-24 DIAGNOSIS — Z5112 Encounter for antineoplastic immunotherapy: Secondary | ICD-10-CM | POA: Diagnosis not present

## 2023-11-24 LAB — CBC WITH DIFFERENTIAL/PLATELET
Abs Immature Granulocytes: 0.02 10*3/uL (ref 0.00–0.07)
Basophils Absolute: 0 10*3/uL (ref 0.0–0.1)
Basophils Relative: 1 %
Eosinophils Absolute: 0.2 10*3/uL (ref 0.0–0.5)
Eosinophils Relative: 3 %
HCT: 32.3 % — ABNORMAL LOW (ref 36.0–46.0)
Hemoglobin: 10.3 g/dL — ABNORMAL LOW (ref 12.0–15.0)
Immature Granulocytes: 0 %
Lymphocytes Relative: 33 %
Lymphs Abs: 1.8 10*3/uL (ref 0.7–4.0)
MCH: 31.4 pg (ref 26.0–34.0)
MCHC: 31.9 g/dL (ref 30.0–36.0)
MCV: 98.5 fL (ref 80.0–100.0)
Monocytes Absolute: 0.3 10*3/uL (ref 0.1–1.0)
Monocytes Relative: 6 %
Neutro Abs: 3.2 10*3/uL (ref 1.7–7.7)
Neutrophils Relative %: 57 %
Platelets: 230 10*3/uL (ref 150–400)
RBC: 3.28 MIL/uL — ABNORMAL LOW (ref 3.87–5.11)
RDW: 13 % (ref 11.5–15.5)
WBC: 5.5 10*3/uL (ref 4.0–10.5)
nRBC: 0 % (ref 0.0–0.2)

## 2023-11-24 LAB — COMPREHENSIVE METABOLIC PANEL WITH GFR
ALT: 15 U/L (ref 0–44)
AST: 24 U/L (ref 15–41)
Albumin: 3.7 g/dL (ref 3.5–5.0)
Alkaline Phosphatase: 45 U/L (ref 38–126)
Anion gap: 9 (ref 5–15)
BUN: 19 mg/dL (ref 8–23)
CO2: 23 mmol/L (ref 22–32)
Calcium: 9.5 mg/dL (ref 8.9–10.3)
Chloride: 106 mmol/L (ref 98–111)
Creatinine, Ser: 1.02 mg/dL — ABNORMAL HIGH (ref 0.44–1.00)
GFR, Estimated: 53 mL/min — ABNORMAL LOW (ref >60)
Glucose, Bld: 85 mg/dL (ref 70–99)
Potassium: 3.5 mmol/L (ref 3.5–5.1)
Sodium: 138 mmol/L (ref 135–145)
Total Bilirubin: 0.3 mg/dL (ref 0.0–1.2)
Total Protein: 6.4 g/dL — ABNORMAL LOW (ref 6.5–8.1)

## 2023-11-24 MED ORDER — DIPHENHYDRAMINE HCL 25 MG PO CAPS
50.0000 mg | ORAL_CAPSULE | Freq: Once | ORAL | Status: AC
  Administered 2023-11-24: 50 mg via ORAL
  Filled 2023-11-24: qty 2

## 2023-11-24 MED ORDER — ACETAMINOPHEN 325 MG PO TABS
650.0000 mg | ORAL_TABLET | Freq: Once | ORAL | Status: AC
  Administered 2023-11-24: 650 mg via ORAL
  Filled 2023-11-24: qty 2

## 2023-11-24 MED ORDER — DARATUMUMAB-HYALURONIDASE-FIHJ 1800-30000 MG-UT/15ML ~~LOC~~ SOLN
1800.0000 mg | Freq: Once | SUBCUTANEOUS | Status: AC
  Administered 2023-11-24: 1800 mg via SUBCUTANEOUS
  Filled 2023-11-24: qty 15

## 2023-11-24 NOTE — Progress Notes (Signed)
 Hematology/Oncology Progress note [phone] [phone]     CHIEF COMPLAINTS/REASON FOR VISIT:  Follow-up for multiple myeloma  ASSESSMENT & PLAN:   Cancer Staging  Multiple myeloma (HCC) Staging form: Plasma Cell Myeloma and Plasma Cell Disorders, AJCC 8th Edition - Clinical stage from 08/22/2021: RISS Stage II (Beta-2 -microglobulin (mg/L): 4.1, Albumin (g/dL): 2.8, ISS: Stage II, High-risk cytogenetics: Absent, LDH: Normal) - Signed by Timmy Forbes, MD on 09/06/2021   Multiple myeloma (HCC) Multiple myeloma, IgA kappa, complex hyperploid gain of 1q, S/p Daratumumab  Rd.   Revlimid  D1-21,  07/23/22 bone marrow biopsy  3% plasma cell, no M protein.   Labs are reviewed and discussed with patient.  Lab Results  Component Value Date   MPROTEIN 0.1 (H) 10/27/2023   KPAFRELGTCHN 7.9 10/27/2023   LAMBDASER 3.3 (L) 10/27/2023   KAPLAMBRATIO 2.39 (H) 10/27/2023     M protein is stable. Stable light chain ratio.  Proceed with  Daratumumab  monotherapy maintenance - she takes home supply of Dexamethasone  prior to her Daratumumab .  On asprin 81mg  daily and Acyclovir  400mg  BID.   Bone lesion 04/20/23 PET scan whole body,  Patchy hypermetabolism throughout the spine with focal hypermetabolism in the right third rib and left sacrum, compatible with residual metabolically active multiple myeloma.  Continue Q3 months Zometa - next due early July 2025 Recommend calcium 1200 mg daily.  Encounter for antineoplastic chemotherapy Chemotherapy plan as listed as above.  Normocytic anemia Stable Continue monitor.   Stage 3a chronic kidney disease (HCC) recommend patient to avoid nephrotoxin.  Encourage oral hydration.  Orders Placed This Encounter  Procedures   Kappa/lambda light chains    Standing Status:   Future    Expected Date:   01/19/2024    Expiration Date:   01/18/2025   Multiple Myeloma Panel (SPEP&IFE w/QIG)    Standing Status:   Future    Expected Date:    01/19/2024    Expiration Date:   01/18/2025   Comprehensive metabolic panel    Standing Status:   Future    Expected Date:   01/19/2024    Expiration Date:   01/18/2025   CBC with Differential    Standing Status:   Future    Expected Date:   01/19/2024    Expiration Date:   01/18/2025     Follow up  Lab/MD/Dara in 4 weeks  All questions were answered. The patient knows to call the clinic with any problems, questions or concerns.  Timmy Forbes, MD, PhD [facility] Hematology Oncology 11/24/2023     HISTORY OF PRESENTING ILLNESS:   Stephanie Delacruz is a  89 y.o.  female presents for follow up of multiple myeloma.  Oncology History  Multiple myeloma (HCC)  08/22/2021 Initial Diagnosis   Multiple myeloma (HCC) IgA kappa Initial M protein 5.2, free kappa light chain ratio 89.2, Kappa/lambda ratio 13.31   08/22/2021 Cancer Staging   Staging form: Plasma Cell Myeloma and Plasma Cell Disorders, AJCC 8th Edition - Clinical stage from 08/22/2021: RISS Stage II (Beta-2 -microglobulin (mg/L): 4.1, Albumin (g/dL): 2.8, ISS: Stage II, High-risk cytogenetics: Absent, LDH: Normal) - Signed by Timmy Forbes, MD on 09/06/2021 Stage prefix: Initial diagnosis Beta 2 microglobulin range (mg/L): 3.5 to 5.49 Albumin range (g/dL): Less than 3.5 Cytogenetics: 1q addition   08/26/2021 - 09/01/2022 Chemotherapy   08/26/21 MYELOMA Daratumumab  SQ q28d  09/06/2021 added Revlimid  10mg  x 14 days.  09/27/2021 starting cycle 2 Revlimid  10 mg 21 days on, 7 days off. Weekly Dexamethasone .  08/26/2021 Bone Marrow Biopsy   Bone marrow biopsy showed hypercellular marrow with plasma cell neoplasm. 67% plasma cells in the aspirate. Cytogenetics showed complex hyperplasia, gain of 1 q.    07/23/2022 Bone Marrow Biopsy   Bone marrow biopsy showed Hypercellular bone marrow for age with trilineage hematopoiesis and 3% plasma cells  Cytogenetics are normal.   04/20/2023 Imaging   PET scan showed  1. Patchy hypermetabolism throughout the  spine with focal hypermetabolism in the right third rib and left sacrum, compatible with residual metabolically active multiple myeloma. 2. Hypermetabolic 5 mm right cervical lymph node, indeterminate.Recommend attention on follow-up. 3. 3.6 cm thyroid  nodule, previously evaluated by ultrasound 08/27/2022 and biopsy 10/07/2022. Please refer to pathology report for further evaluation. No follow-up recommended unless clinically warranted. (Ref: J Am Coll Radiol. 2015 Feb;12(2): 143-50). 4. Aortic atherosclerosis (ICD10-I70.0). Coronary artery calcification.    S/p thyroid  lesion biopsy, obtained by endocrinologist. + AUS  INTERVAL HISTORY Tariah L Heist is a 89 y.o. female who has above history reviewed by me today presents for follow up visit for management of multiple myeloma She was accompanied by her daughter today.  She denies any nausea vomiting diarrhea.   No new complaints.   Review of Systems  Constitutional:  Positive for fatigue. Negative for appetite change, chills and fever.  HENT:   Negative for hearing loss and voice change.   Eyes:  Negative for eye problems.  Respiratory:  Negative for chest tightness and cough.   Cardiovascular:  Negative for chest pain.  Gastrointestinal:  Negative for abdominal distention, abdominal pain and blood in stool.  Endocrine: Negative for hot flashes.  Genitourinary:  Negative for difficulty urinating and frequency.   Musculoskeletal:  Positive for back pain. Negative for arthralgias.  Skin:  Negative for itching and rash.  Neurological:  Negative for extremity weakness.  Hematological:  Negative for adenopathy.  Psychiatric/Behavioral:  Negative for confusion.     MEDICAL HISTORY:  Past Medical History:  Diagnosis Date   Anemia    GERD (gastroesophageal reflux disease)    Hypercholesterolemia    Hypertension    Renal insufficiency     SURGICAL HISTORY: Past Surgical History:  Procedure Laterality Date   TONSILLECTOMY     TUBAL  LIGATION      SOCIAL HISTORY: Social History   Socioeconomic History   Marital status: Married    Spouse name: Not on file   Number of children: Not on file   Years of education: Not on file   Highest education level: Not on file  Occupational History   Not on file  Tobacco Use   Smoking status: Never   Smokeless tobacco: Never  Vaping Use   Vaping status: Never Used  Substance and Sexual Activity   Alcohol use: No   Drug use: No   Sexual activity: Not on file  Other Topics Concern   Not on file  Social History Narrative   Lives with husband Royston Cornea and daughter Shelvy Dickens. No pets.    Social Drivers of Corporate investment banker Strain: Not on file  Food Insecurity: Not on file  Transportation Needs: Not on file  Physical Activity: Not on file  Stress: Not on file  Social Connections: Not on file  Intimate Partner Violence: Not on file    FAMILY HISTORY: Family History  Problem Relation Age of Onset   Hypertension Mother    Arthritis Mother    Hypertension Father    Stroke Father    Breast  cancer Niece     ALLERGIES:  has no known allergies.  MEDICATIONS:  Current Outpatient Medications  Medication Sig Dispense Refill   acyclovir  (ZOVIRAX ) 400 MG tablet Take 1 tablet (400 mg total) by mouth 2 (two) times daily. 60 tablet 11   amLODipine  (NORVASC ) 2.5 MG tablet Take 1 tablet (2.5 mg total) by mouth daily.     ascorbic acid (VITAMIN C) 250 MG tablet daily Take 2 tablets every day by oral route.     aspirin 81 MG EC tablet Take 81 mg by mouth daily.     Calcium Carb-Cholecalciferol (CALCIUM 600+D) 600-20 MG-MCG TABS Take 1 tablet by mouth 3 (three) times daily.     dexamethasone  (DECADRON ) 4 MG tablet Take 5 tablets (20 mg total) by mouth See admin instructions. Take 20mg  prior to each Daratumumab  treatment. 30 tablet 1   donepezil  (ARICEPT ) 10 MG tablet Take 5 mg by mouth at bedtime.     fenofibrate  (TRICOR ) 48 MG tablet TAKE 1 TABLET(48 MG) BY MOUTH DAILY 90  tablet 2   fexofenadine (ALLEGRA) 180 MG tablet Take by mouth.     hydrochlorothiazide  (HYDRODIURIL ) 25 MG tablet TAKE 1/2 TABLET BY MOUTH EVERY DAY 45 tablet 3   memantine (NAMENDA) 5 MG tablet Take 5 mg by mouth 2 (two) times daily.     methimazole  (TAPAZOLE ) 5 MG tablet Take 1 tablet (5 mg total) by mouth daily. 90 tablet 3   metoprolol  succinate (TOPROL -XL) 25 MG 24 hr tablet Take 1 tablet (25 mg total) by mouth daily. 90 tablet 3   montelukast  (SINGULAIR ) 10 MG tablet Take 1 tablet (10 mg total) by mouth See admin instructions. Take 1 tablet one day before Daratumumab  injection. 60 tablet 0   Multiple Vitamins-Minerals (WOMENS MULTIVITAMIN PO) Take 1 tablet by mouth daily.     prochlorperazine  (COMPAZINE ) 10 MG tablet Take 10 mg by mouth every 6 (six) hours as needed for nausea or vomiting.     valsartan  (DIOVAN ) 160 MG tablet TAKE 1 TABLET(160 MG) BY MOUTH DAILY 90 tablet 1   omeprazole  (PRILOSEC) 20 MG capsule TAKE 1 CAPSULE(20 MG) BY MOUTH DAILY 90 capsule 3   No current facility-administered medications for this visit.     PHYSICAL EXAMINATION: ECOG PERFORMANCE STATUS: 1 - Symptomatic but completely ambulatory Vitals:   11/24/23 0852  BP: (!) 154/66  Pulse: 62  Resp: 18  Temp: 98.7 F (37.1 C)   Filed Weights   11/24/23 0852  Weight: 149 lb 1.6 oz (67.6 kg)    Physical Exam Constitutional:      General: She is not in acute distress. HENT:     Head: Normocephalic and atraumatic.  Eyes:     General: No scleral icterus. Cardiovascular:     Rate and Rhythm: Normal rate and regular rhythm.     Heart sounds: Normal heart sounds.  Pulmonary:     Effort: Pulmonary effort is normal. No respiratory distress.     Breath sounds: No wheezing.  Abdominal:     General: Bowel sounds are normal. There is no distension.     Palpations: Abdomen is soft.  Musculoskeletal:        General: No deformity. Normal range of motion.     Cervical back: Normal range of motion and neck  supple.  Skin:    General: Skin is warm and dry.     Findings: No erythema or rash.  Neurological:     Mental Status: She is alert and oriented to person,  place, and time. Mental status is at baseline.     Cranial Nerves: No cranial nerve deficit.     Coordination: Coordination normal.  Psychiatric:        Mood and Affect: Mood normal.     LABORATORY DATA:  I have reviewed the data as listed    Latest Ref Rng & Units 11/24/2023    8:33 AM 10/27/2023    9:05 AM 09/29/2023    8:40 AM  CBC  WBC 4.0 - 10.5 K/uL 5.5  6.0  5.0   Hemoglobin 12.0 - 15.0 g/dL 76.2  83.1  51.7   Hematocrit 36.0 - 46.0 % 32.3  31.7  32.6   Platelets 150 - 400 K/uL 230  211  235       Latest Ref Rng & Units 11/24/2023    8:33 AM 10/27/2023    9:05 AM 09/29/2023    8:40 AM  CMP  Glucose 70 - 99 mg/dL 85  616  93   BUN 8 - 23 mg/dL 19  22  18    Creatinine 0.44 - 1.00 mg/dL 0.73  7.10  6.26   Sodium 135 - 145 mmol/L 138  138  141   Potassium 3.5 - 5.1 mmol/L 3.5  3.6  3.7   Chloride 98 - 111 mmol/L 106  106  107   CO2 22 - 32 mmol/L 23  23  25    Calcium 8.9 - 10.3 mg/dL 9.5  9.0  9.6   Total Protein 6.5 - 8.1 g/dL 6.4  6.6  6.5   Total Bilirubin 0.0 - 1.2 mg/dL 0.3  0.5  0.4   Alkaline Phos 38 - 126 U/L 45  43  42   AST 15 - 41 U/L 24  25  24    ALT 0 - 44 U/L 15  17  17       Iron/TIBC/Ferritin/ %Sat    Component Value Date/Time   IRON 54 07/24/2021 1212   TIBC 277 07/24/2021 1212   FERRITIN 93 07/24/2021 1212   IRONPCTSAT 20 07/24/2021 1212       RADIOGRAPHIC STUDIES: I have personally reviewed the radiological images as listed and agreed with the findings in the report. No results found.

## 2023-11-24 NOTE — Patient Instructions (Signed)
 [facility] - A DEPT OF [facility]  Discharge Instructions: Thank you for choosing [facility] to provide your oncology and hematology care.  If you have a lab appointment with the Cancer Center, please go directly to the Cancer Center and check in at the registration area.  Wear comfortable clothing and clothing appropriate for easy access to any Portacath or PICC line.   We strive to give you quality time with your provider. You may need to reschedule your appointment if you arrive late (15 or more minutes).  Arriving late affects you and other patients whose appointments are after yours.  Also, if you miss three or more appointments without notifying the office, you may be dismissed from the clinic at the provider's discretion.      For prescription refill requests, have your pharmacy contact our office and allow 72 hours for refills to be completed.    Today you received the following chemotherapy and/or immunotherapy agents- daratumumab      To help prevent nausea and vomiting after your treatment, we encourage you to take your nausea medication as directed.  BELOW ARE SYMPTOMS THAT SHOULD BE REPORTED IMMEDIATELY: *FEVER GREATER THAN 100.4 F (38 C) OR HIGHER *CHILLS OR SWEATING *NAUSEA AND VOMITING THAT IS NOT CONTROLLED WITH YOUR NAUSEA MEDICATION *UNUSUAL SHORTNESS OF BREATH *UNUSUAL BRUISING OR BLEEDING *URINARY PROBLEMS (pain or burning when urinating, or frequent urination) *BOWEL PROBLEMS (unusual diarrhea, constipation, pain near the anus) TENDERNESS IN MOUTH AND THROAT WITH OR WITHOUT PRESENCE OF ULCERS (sore throat, sores in mouth, or a toothache) UNUSUAL RASH, SWELLING OR PAIN  UNUSUAL VAGINAL DISCHARGE OR ITCHING   Items with * indicate a potential emergency and should be followed up as soon as possible or go to the Emergency Department if any problems should occur.  Please show the CHEMOTHERAPY ALERT CARD or IMMUNOTHERAPY  ALERT CARD at check-in to the Emergency Department and triage nurse.  Should you have questions after your visit or need to cancel or reschedule your appointment, please contact [facility] - A DEPT OF [facility]  [phone] and follow the prompts.  Office hours are 8:00 a.m. to 4:30 p.m. Monday - Friday. Please note that voicemails left after 4:00 p.m. may not be returned until the following business day.  We are closed weekends and major holidays. You have access to a nurse at all times for urgent questions. Please call the main number to the clinic [phone] and follow the prompts.  For any non-urgent questions, you may also contact your provider using MyChart. We now offer e-Visits for anyone 59 and older to request care online for non-urgent symptoms. For details visit [email or web address].   Also download the MyChart app! Go to the app store, search "MyChart", open the app, select [place], and log in with your MyChart username and password.

## 2023-11-24 NOTE — Assessment & Plan Note (Signed)
recommend patient to avoid nephrotoxin.  Encourage oral hydration. 

## 2023-11-24 NOTE — Progress Notes (Signed)
 Pt here for follow up. No new concerns voiced.

## 2023-11-24 NOTE — Assessment & Plan Note (Signed)
 Multiple myeloma, IgA kappa, complex hyperploid gain of 1q, S/p Daratumumab  Rd.   Revlimid  D1-21,  07/23/22 bone marrow biopsy  3% plasma cell, no M protein.   Labs are reviewed and discussed with patient.  Lab Results  Component Value Date   MPROTEIN 0.1 (H) 10/27/2023   KPAFRELGTCHN 7.9 10/27/2023   LAMBDASER 3.3 (L) 10/27/2023   KAPLAMBRATIO 2.39 (H) 10/27/2023     M protein is stable. Stable light chain ratio.  Proceed with  Daratumumab  monotherapy maintenance - she takes home supply of Dexamethasone  prior to her Daratumumab .  On asprin 81mg  daily and Acyclovir  400mg  BID.

## 2023-11-24 NOTE — Assessment & Plan Note (Signed)
Chemotherapy plan as listed as above. 

## 2023-11-24 NOTE — Assessment & Plan Note (Signed)
 04/20/23 PET scan whole body,  Patchy hypermetabolism throughout the spine with focal hypermetabolism in the right third rib and left sacrum, compatible with residual metabolically active multiple myeloma.  Continue Q3 months Zometa - next due early July 2025 Recommend calcium 1200 mg daily.

## 2023-11-24 NOTE — Assessment & Plan Note (Signed)
 Stable. Continue monitor

## 2023-11-25 ENCOUNTER — Other Ambulatory Visit: Payer: Self-pay

## 2023-11-25 LAB — KAPPA/LAMBDA LIGHT CHAINS
Kappa free light chain: 8.3 mg/L (ref 3.3–19.4)
Kappa, lambda light chain ratio: 2.18 — ABNORMAL HIGH (ref 0.26–1.65)
Lambda free light chains: 3.8 mg/L — ABNORMAL LOW (ref 5.7–26.3)

## 2023-11-27 LAB — MULTIPLE MYELOMA PANEL, SERUM
Albumin SerPl Elph-Mcnc: 3.5 g/dL (ref 2.9–4.4)
Albumin/Glob SerPl: 1.5 (ref 0.7–1.7)
Alpha 1: 0.2 g/dL (ref 0.0–0.4)
Alpha2 Glob SerPl Elph-Mcnc: 0.7 g/dL (ref 0.4–1.0)
B-Globulin SerPl Elph-Mcnc: 1.1 g/dL (ref 0.7–1.3)
Gamma Glob SerPl Elph-Mcnc: 0.4 g/dL (ref 0.4–1.8)
Globulin, Total: 2.4 g/dL (ref 2.2–3.9)
IgA: 157 mg/dL (ref 64–422)
IgG (Immunoglobin G), Serum: 517 mg/dL — ABNORMAL LOW (ref 586–1602)
IgM (Immunoglobulin M), Srm: 21 mg/dL — ABNORMAL LOW (ref 26–217)
Total Protein ELP: 5.9 g/dL — ABNORMAL LOW (ref 6.0–8.5)

## 2023-11-30 ENCOUNTER — Ambulatory Visit (INDEPENDENT_AMBULATORY_CARE_PROVIDER_SITE_OTHER): Payer: Medicare Other | Admitting: Internal Medicine

## 2023-11-30 ENCOUNTER — Encounter: Payer: Self-pay | Admitting: Internal Medicine

## 2023-11-30 VITALS — BP 110/70 | HR 61 | Temp 97.8°F | Resp 16 | Ht 63.5 in | Wt 149.2 lb

## 2023-11-30 DIAGNOSIS — R2681 Unsteadiness on feet: Secondary | ICD-10-CM

## 2023-11-30 DIAGNOSIS — N1831 Chronic kidney disease, stage 3a: Secondary | ICD-10-CM

## 2023-11-30 DIAGNOSIS — R739 Hyperglycemia, unspecified: Secondary | ICD-10-CM | POA: Diagnosis not present

## 2023-11-30 DIAGNOSIS — H9190 Unspecified hearing loss, unspecified ear: Secondary | ICD-10-CM | POA: Insufficient documentation

## 2023-11-30 DIAGNOSIS — F0390 Unspecified dementia without behavioral disturbance: Secondary | ICD-10-CM

## 2023-11-30 DIAGNOSIS — I10 Essential (primary) hypertension: Secondary | ICD-10-CM

## 2023-11-30 DIAGNOSIS — E7800 Pure hypercholesterolemia, unspecified: Secondary | ICD-10-CM

## 2023-11-30 DIAGNOSIS — E042 Nontoxic multinodular goiter: Secondary | ICD-10-CM

## 2023-11-30 DIAGNOSIS — C9001 Multiple myeloma in remission: Secondary | ICD-10-CM

## 2023-11-30 DIAGNOSIS — C9000 Multiple myeloma not having achieved remission: Secondary | ICD-10-CM

## 2023-11-30 LAB — BASIC METABOLIC PANEL WITH GFR
BUN: 17 mg/dL (ref 6–23)
CO2: 25 meq/L (ref 19–32)
Calcium: 9.7 mg/dL (ref 8.4–10.5)
Chloride: 107 meq/L (ref 96–112)
Creatinine, Ser: 0.97 mg/dL (ref 0.40–1.20)
GFR: 52.39 mL/min — ABNORMAL LOW (ref >60.00)
Glucose, Bld: 93 mg/dL (ref 70–99)
Potassium: 4 meq/L (ref 3.5–5.1)
Sodium: 143 meq/L (ref 135–145)

## 2023-11-30 LAB — LIPID PANEL
Cholesterol: 226 mg/dL — ABNORMAL HIGH (ref 0–200)
HDL: 80.2 mg/dL (ref >39.00)
LDL Cholesterol: 132 mg/dL — ABNORMAL HIGH (ref 0–99)
NonHDL: 145.74
Total CHOL/HDL Ratio: 3
Triglycerides: 68 mg/dL (ref 0.0–149.0)
VLDL: 13.6 mg/dL (ref 0.0–40.0)

## 2023-11-30 LAB — TSH: TSH: 1.47 u[IU]/mL (ref 0.35–5.50)

## 2023-11-30 LAB — HEMOGLOBIN A1C: Hgb A1c MFr Bld: 5.8 % (ref 4.6–6.5)

## 2023-11-30 NOTE — Assessment & Plan Note (Signed)
 Continues aricept  and namenda. Continues f/u with neurology. Stable.

## 2023-11-30 NOTE — Assessment & Plan Note (Signed)
 Avoid antiinflammatories.  Stay hydrated.  Check metabolic panel today.

## 2023-11-30 NOTE — Assessment & Plan Note (Signed)
 Thyroid  ultrasound - reveals changes that appear to be consistent with multinodular goiter. Saw endocrinology - recommended FNA. Molecular testing of the thyroid  - no evidence of cancer. Recommended methimazole . 07/01/23 - f/u MTN/subclinical hyperthyroid - continue methimazole . Planning for f/u thyroid  ultrasound next visit.  Reports has f/u scheduled.

## 2023-11-30 NOTE — Assessment & Plan Note (Signed)
 Being followed by hematology for treatment - multiple myeloma. Treated with Daratumumab  and revilimid. S/p bone marro biopsy - 3% plasma cell, no M protein.  In CR. Continues aspirin daily.  Continues - Daratumumab  monotherapy maintenance. Remains on aspirin and acyclovir . Also continues on zometa  - q 3 months. Stable.

## 2023-11-30 NOTE — Assessment & Plan Note (Signed)
 Blood pressure as outlined.  Continue amlodipine , metoprolol , hydrochlorothiazide  and diovan .  Follow pressures.  Check metabolic panel today.

## 2023-11-30 NOTE — Progress Notes (Signed)
 Subjective:    Patient ID: Augustine Led, female    DOB: October 01, 1933, 89 y.o.   MRN: [id number]  Patient here for  Chief Complaint  Patient presents with   Medical Management of Chronic Issues    HPI Here for a scheduled follow up - follow up regarding hypertension, hypercholesterolemia and multiple myeloma. She is accompanied by her daughter. History obtained from both of them. Being followed by hematology for multiple myeloma. Last seen 11/24/23 - M protein stable. Stable light chain ratio. Continues with Daratumumab . (Takes desamethasone prior to treatment).  Continue q 3 months zometa  - next due 01/2024. Continue calcium, aspirin and acyclovir .  Had f/u with neurology 11/18/23 - f/u [facility]. Continue donepezil  and namenda. Stable. Had f/u with cardiology 10/29/23 - stable on toprol  XL 25mg  q day and amlodipine  2.5mg  q day. ECHO 06/2023 - EF 60-65%.  Breathing stable. No chest pain reported. Appetite is good. No nausea or vomiting. No bowel change. She reports her bowels move well. Daughter reports some unsteadiness when walking. Request rx for rollator. Also has noticed some decreased hearing and request hearing evaluation. Daughter also interested in social work referral for help in the home. Her daughter who was helping her just had a medical emergency and is unable to do the things around her house.    Past Medical History:  Diagnosis Date   Anemia    GERD (gastroesophageal reflux disease)    Hypercholesterolemia    Hypertension    Renal insufficiency    Past Surgical History:  Procedure Laterality Date   TONSILLECTOMY     TUBAL LIGATION     Family History  Problem Relation Age of Onset   Hypertension Mother    Arthritis Mother    Hypertension Father    Stroke Father    Breast cancer Niece    Social History   Socioeconomic History   Marital status: Married    Spouse name: Not on file   Number of children: Not on file   Years of education: Not on file   Highest education level:  Not on file  Occupational History   Not on file  Tobacco Use   Smoking status: Never   Smokeless tobacco: Never  Vaping Use   Vaping status: Never Used  Substance and Sexual Activity   Alcohol use: No   Drug use: No   Sexual activity: Not on file  Other Topics Concern   Not on file  Social History Narrative   Lives with husband Royston Cornea and daughter Shelvy Dickens. No pets.    Social Drivers of Corporate investment banker Strain: Not on file  Food Insecurity: Not on file  Transportation Needs: Not on file  Physical Activity: Not on file  Stress: Not on file  Social Connections: Not on file     Review of Systems  Constitutional:  Negative for appetite change and unexpected weight change.  HENT:  Negative for congestion and sinus pressure.   Respiratory:  Negative for cough, chest tightness and shortness of breath.   Cardiovascular:  Negative for chest pain, palpitations and leg swelling.  Gastrointestinal:  Negative for abdominal pain, diarrhea, nausea and vomiting.  Genitourinary:  Negative for difficulty urinating and dysuria.  Musculoskeletal:  Negative for myalgias.       Some unsteady gait as outlined.   Skin:  Negative for color change and rash.  Neurological:  Negative for dizziness and headaches.  Psychiatric/Behavioral:  Negative for agitation and dysphoric mood.  Objective:     BP 110/70   Pulse 61   Temp 97.8 F (36.6 C)   Resp 16   Ht 5' 3.5" (1.613 m)   Wt 149 lb 3.2 oz (67.7 kg)   SpO2 99%   BMI 26.02 kg/m  Wt Readings from Last 3 Encounters:  11/30/23 149 lb 3.2 oz (67.7 kg)  11/24/23 149 lb 1.6 oz (67.6 kg)  10/29/23 150 lb 3.2 oz (68.1 kg)    Physical Exam Vitals reviewed.  Constitutional:      General: She is not in acute distress.    Appearance: Normal appearance.  HENT:     Head: Normocephalic and atraumatic.     Right Ear: External ear normal.     Left Ear: External ear normal.     Mouth/Throat:     Pharynx: No oropharyngeal  exudate or posterior oropharyngeal erythema.  Eyes:     General: No scleral icterus.       Right eye: No discharge.        Left eye: No discharge.     Conjunctiva/sclera: Conjunctivae normal.  Neck:     Thyroid : No thyromegaly.  Cardiovascular:     Rate and Rhythm: Normal rate and regular rhythm.  Pulmonary:     Effort: No respiratory distress.     Breath sounds: Normal breath sounds. No wheezing.  Abdominal:     General: Bowel sounds are normal.     Palpations: Abdomen is soft.     Tenderness: There is no abdominal tenderness.  Musculoskeletal:        General: No swelling or tenderness.     Cervical back: Neck supple. No tenderness.  Lymphadenopathy:     Cervical: No cervical adenopathy.  Skin:    Findings: No erythema or rash.  Neurological:     Mental Status: She is alert.  Psychiatric:        Mood and Affect: Mood normal.        Behavior: Behavior normal.         Outpatient Encounter Medications as of 11/30/2023  Medication Sig   donepezil  (ARICEPT ) 5 MG tablet Take 5 mg by mouth daily.   acyclovir  (ZOVIRAX ) 400 MG tablet Take 1 tablet (400 mg total) by mouth 2 (two) times daily.   amLODipine  (NORVASC ) 2.5 MG tablet Take 1 tablet (2.5 mg total) by mouth daily.   ascorbic acid (VITAMIN C) 250 MG tablet daily Take 2 tablets every day by oral route.   aspirin 81 MG EC tablet Take 81 mg by mouth daily.   Calcium Carb-Cholecalciferol (CALCIUM 600+D) 600-20 MG-MCG TABS Take 1 tablet by mouth 3 (three) times daily.   dexamethasone  (DECADRON ) 4 MG tablet Take 5 tablets (20 mg total) by mouth See admin instructions. Take 20mg  prior to each Daratumumab  treatment.   fenofibrate  (TRICOR ) 48 MG tablet TAKE 1 TABLET(48 MG) BY MOUTH DAILY   fexofenadine (ALLEGRA) 180 MG tablet Take by mouth.   hydrochlorothiazide  (HYDRODIURIL ) 25 MG tablet TAKE 1/2 TABLET BY MOUTH EVERY DAY   memantine (NAMENDA) 5 MG tablet Take 5 mg by mouth 2 (two) times daily.   methimazole  (TAPAZOLE ) 5 MG  tablet Take 1 tablet (5 mg total) by mouth daily.   metoprolol  succinate (TOPROL -XL) 25 MG 24 hr tablet Take 1 tablet (25 mg total) by mouth daily.   montelukast  (SINGULAIR ) 10 MG tablet Take 1 tablet (10 mg total) by mouth See admin instructions. Take 1 tablet one day before Daratumumab  injection.   Multiple Vitamins-Minerals (  WOMENS MULTIVITAMIN PO) Take 1 tablet by mouth daily.   omeprazole  (PRILOSEC) 20 MG capsule TAKE 1 CAPSULE(20 MG) BY MOUTH DAILY   prochlorperazine  (COMPAZINE ) 10 MG tablet Take 10 mg by mouth every 6 (six) hours as needed for nausea or vomiting.   valsartan  (DIOVAN ) 160 MG tablet TAKE 1 TABLET(160 MG) BY MOUTH DAILY   [DISCONTINUED] donepezil  (ARICEPT ) 10 MG tablet Take 5 mg by mouth at bedtime.   No facility-administered encounter medications on file as of 11/30/2023.     Lab Results  Component Value Date   WBC 5.5 11/24/2023   HGB 10.3 (L) 11/24/2023   HCT 32.3 (L) 11/24/2023   PLT 230 11/24/2023   GLUCOSE 85 11/24/2023   CHOL 237 (H) 07/31/2023   TRIG 57 07/31/2023   HDL 89 07/31/2023   LDLCALC 134 (H) 07/31/2023   ALT 15 11/24/2023   AST 24 11/24/2023   NA 138 11/24/2023   K 3.5 11/24/2023   CL 106 11/24/2023   CREATININE 1.02 (H) 11/24/2023   BUN 19 11/24/2023   CO2 23 11/24/2023   TSH 2.12 07/01/2023    NM PET Image Restage (PS) Whole Body Result Date: 04/20/2023 CLINICAL DATA:  Subsequent treatment strategy for multiple myeloma. EXAM: NUCLEAR MEDICINE PET WHOLE BODY TECHNIQUE: 7.9 mCi F-18 FDG was injected intravenously. Full-ring PET imaging was performed from the head to foot after the radiotracer. CT data was obtained and used for attenuation correction and anatomic localization. Fasting blood glucose: 80 mg/dl COMPARISON:  54/03/8118. FINDINGS: Mediastinal blood pool activity: SUV max 3.2 HEAD/NECK: Right level II/III lymph node measures 5 mm (6/47), SUV max 4.9. No additional abnormal hypermetabolism. Incidental CT findings: None. CHEST: No  abnormal hypermetabolism. Incidental CT findings: Thyroid  nodule in the isthmus and left lobe measures 2.6 x 3.6 cm. Atherosclerotic calcification of the aorta, aortic valve and coronary arteries. Heart is enlarged. No pericardial or pleural effusion. ABDOMEN/PELVIS: No abnormal hypermetabolism. Incidental CT findings: Liver, gallbladder, adrenal glands, kidneys, spleen, pancreas, stomach and bowel are grossly unremarkable. SKELETON: Patchy hypermetabolism throughout the spine. Focal hypermetabolism in the lateral aspect the right third rib, SUV max 3.6. Focal hypermetabolism in the left sacrum, SUV max 3.2. Incidental CT findings: Degenerative changes in the spine. EXTREMITIES: Possible degenerative uptake in the left foot. Otherwise, no abnormal hypermetabolism. Incidental CT findings: None. IMPRESSION: 1. Patchy hypermetabolism throughout the spine with focal hypermetabolism in the right third rib and left sacrum, compatible with residual metabolically active multiple myeloma. 2. Hypermetabolic 5 mm right cervical lymph node, indeterminate. Recommend attention on follow-up. 3. 3.6 cm thyroid  nodule, previously evaluated by ultrasound 08/27/2022 and biopsy 10/07/2022. Please refer to pathology report for further evaluation. No follow-up recommended unless clinically warranted. (Ref: J Am Coll Radiol. 2015 Feb;12(2): 143-50). 4. Aortic atherosclerosis (ICD10-I70.0). Coronary artery calcification. Electronically Signed   By: Shearon Denis M.D.   On: 04/20/2023 10:24       Assessment & Plan:  Essential (primary) hypertension Assessment & Plan: Blood pressure as outlined.  Continue amlodipine , metoprolol , hydrochlorothiazide  and diovan .  Follow pressures.  Check metabolic panel today.   Orders: -     Basic metabolic panel with GFR  Multinodular goiter Assessment & Plan: Thyroid  ultrasound - reveals changes that appear to be consistent with multinodular goiter. Saw endocrinology - recommended FNA.  Molecular testing of the thyroid  - no evidence of cancer. Recommended methimazole . 07/01/23 - f/u MTN/subclinical hyperthyroid - continue methimazole . Planning for f/u thyroid  ultrasound next visit.  Reports has f/u scheduled.  Orders: -     TSH  Pure hypercholesterolemia Assessment & Plan: On tricor .  Follow lipid panel.  Check lipid panel today.   Orders: -     Lipid panel  Hyperglycemia -     Hemoglobin A1c  Multiple myeloma not having achieved remission (HCC) -     For home use only DME 4 wheeled rolling walker with seat -     AMB Referral [facility] Care Management  Unstable gait -     For home use only DME 4 wheeled rolling walker with seat -     AMB Referral [facility] Care Management  Dementia without behavioral disturbance, psychotic disturbance, mood disturbance, or anxiety, unspecified dementia severity, unspecified dementia type (HCC) Assessment & Plan: Continues aricept  and namenda. Continues f/u with neurology. Stable.    Multiple myeloma in remission [facility]) Assessment & Plan: Being followed by hematology for treatment - multiple myeloma. Treated with Daratumumab  and revilimid. S/p bone marro biopsy - 3% plasma cell, no M protein.  In CR. Continues aspirin daily.  Continues - Daratumumab  monotherapy maintenance. Remains on aspirin and acyclovir . Also continues on zometa  - q 3 months. Stable.    Stage 3a chronic kidney disease (HCC) Assessment & Plan: Avoid antiinflammatories.  Stay hydrated.  Check metabolic panel today.    Change in hearing, unspecified laterality Assessment & Plan: Request referral for hearing evaluation.   Orders: -     Ambulatory referral to ENT     Dellar Fenton, MD

## 2023-11-30 NOTE — Assessment & Plan Note (Signed)
 On tricor .  Follow lipid panel.  Check lipid panel today.

## 2023-11-30 NOTE — Assessment & Plan Note (Signed)
 Request referral for hearing evaluation.

## 2023-12-01 ENCOUNTER — Other Ambulatory Visit: Payer: Self-pay

## 2023-12-07 ENCOUNTER — Telehealth: Payer: Self-pay | Admitting: *Deleted

## 2023-12-07 NOTE — Progress Notes (Signed)
 Complex Care Management Note  Care Guide Note 12/07/2023 Name: ROKEISHA BEITH MRN: [id number] DOB: 1933-12-29  Scottlynn L Perrault is a 89 y.o. year old female who sees Dellar Fenton, MD for primary care. I reached out to Augustine Led by phone today to offer complex care management services.  Ms. Sucher was given information about Complex Care Management services today including:   The Complex Care Management services include support from the care team which includes your Nurse Care Manager, Clinical Social Worker, or Pharmacist.  The Complex Care Management team is here to help remove barriers to the health concerns and goals most important to you. Complex Care Management services are voluntary, and the patient may decline or stop services at any time by request to their care team member.   Complex Care Management Consent Status: Patient agreed to services and verbal consent obtained.   Follow up plan:  Telephone appointment with complex care management team member scheduled for:  12/14/2023  Encounter Outcome:  Patient Scheduled  Kandis Ormond, CMA [place]  [facility], [place] [facility] Guide Direct Dial: [phone]  Fax: [phone] Website: [email or web address]

## 2023-12-09 ENCOUNTER — Encounter: Payer: Self-pay | Admitting: Internal Medicine

## 2023-12-10 ENCOUNTER — Ambulatory Visit (INDEPENDENT_AMBULATORY_CARE_PROVIDER_SITE_OTHER): Payer: Medicare Other | Admitting: Nurse Practitioner

## 2023-12-10 VITALS — BP 146/74 | HR 65 | Resp 18 | Ht 62.0 in | Wt 150.0 lb

## 2023-12-10 DIAGNOSIS — I10 Essential (primary) hypertension: Secondary | ICD-10-CM | POA: Diagnosis not present

## 2023-12-10 DIAGNOSIS — I890 Lymphedema, not elsewhere classified: Secondary | ICD-10-CM

## 2023-12-10 DIAGNOSIS — N1831 Chronic kidney disease, stage 3a: Secondary | ICD-10-CM

## 2023-12-11 ENCOUNTER — Other Ambulatory Visit: Payer: Self-pay

## 2023-12-14 ENCOUNTER — Other Ambulatory Visit: Payer: Self-pay | Admitting: Licensed Clinical Social Worker

## 2023-12-14 ENCOUNTER — Encounter (INDEPENDENT_AMBULATORY_CARE_PROVIDER_SITE_OTHER): Payer: Self-pay | Admitting: Nurse Practitioner

## 2023-12-14 NOTE — Patient Instructions (Signed)
 Visit Information  Thank you for taking time to visit with me today. Please don't hesitate to contact me if I can be of assistance to you before our next scheduled appointment.  Your next care management appointment is by telephone on 12/28/2023 at 2pm  Telephone follow up appointment date/time:  12/28/2023  Please call the care guide team at [phone] if you need to cancel, schedule, or reschedule an appointment.   Please call 911 if you are experiencing a Mental Health or Behavioral Health Crisis or need someone to talk to.  Fletcher Humble MSW, LCSW Licensed Clinical Social Worker  [facility], Population Health Direct Dial: [phone]  Fax: [phone]

## 2023-12-14 NOTE — Progress Notes (Signed)
 Subjective:    Patient ID: Stephanie Delacruz, female    DOB: 1933-10-14, 89 y.o.   MRN: [id number] Chief Complaint  Patient presents with   Venous Insufficiency    The patient returns to the office for followup evaluation regarding leg swelling.  The swelling has improved quite a bit and the pain associated with swelling has decreased substantially. There have not been any interval development of a ulcerations or wounds.  Since the previous visit the patient has been wearing graduated compression stockings and has noted improvement in the lymphedema. The patient has been using compression routinely morning until night.  The patient also states elevation during the day and exercise (such as walking) is being done too.  Overall she has been progressing very well.       Review of Systems  Cardiovascular:  Positive for leg swelling.  All other systems reviewed and are negative.      Objective:   Physical Exam Vitals reviewed.  HENT:     Head: Normocephalic.  Cardiovascular:     Rate and Rhythm: Normal rate.  Pulmonary:     Effort: Pulmonary effort is normal.  Musculoskeletal:     Left lower leg: Edema present.  Skin:    General: Skin is warm and dry.  Neurological:     Mental Status: She is alert and oriented to person, place, and time.  Psychiatric:        Mood and Affect: Mood normal.        Behavior: Behavior normal.        Thought Content: Thought content normal.        Judgment: Judgment normal.     BP (!) 146/74 (BP Location: Left Arm, Patient Position: Sitting, Cuff Size: Normal)   Pulse 65   Resp 18   Ht 5\' 2"  (1.575 m)   Wt 150 lb (68 kg)   BMI 27.44 kg/m   Past Medical History:  Diagnosis Date   Anemia    GERD (gastroesophageal reflux disease)    Hypercholesterolemia    Hypertension    Renal insufficiency     Social History   Socioeconomic History   Marital status: Married    Spouse name: Not on file   Number of children: Not on file   Years of  education: Not on file   Highest education level: Not on file  Occupational History   Not on file  Tobacco Use   Smoking status: Never   Smokeless tobacco: Never  Vaping Use   Vaping status: Never Used  Substance and Sexual Activity   Alcohol use: No   Drug use: No   Sexual activity: Not on file  Other Topics Concern   Not on file  Social History Narrative   Lives with husband Royston Cornea and daughter Shelvy Dickens. No pets.    Social Drivers of Corporate investment banker Strain: Not on file  Food Insecurity: Not on file  Transportation Needs: Not on file  Physical Activity: Not on file  Stress: Not on file  Social Connections: Not on file  Intimate Partner Violence: Not on file    Past Surgical History:  Procedure Laterality Date   TONSILLECTOMY     TUBAL LIGATION      Family History  Problem Relation Age of Onset   Hypertension Mother    Arthritis Mother    Hypertension Father    Stroke Father    Breast cancer Niece     No Known Allergies  Latest Ref Rng & Units 11/24/2023    8:33 AM 10/27/2023    9:05 AM 09/29/2023    8:40 AM  CBC  WBC 4.0 - 10.5 K/uL 5.5  6.0  5.0   Hemoglobin 12.0 - 15.0 g/dL 40.9  81.1  91.4   Hematocrit 36.0 - 46.0 % 32.3  31.7  32.6   Platelets 150 - 400 K/uL 230  211  235       CMP     Component Value Date/Time   NA 143 11/30/2023 1337   K 4.0 11/30/2023 1337   CL 107 11/30/2023 1337   CO2 25 11/30/2023 1337   GLUCOSE 93 11/30/2023 1337   BUN 17 11/30/2023 1337   CREATININE 0.97 11/30/2023 1337   CALCIUM 9.7 11/30/2023 1337   PROT 6.4 (L) 11/24/2023 0833   ALBUMIN 3.7 11/24/2023 0833   AST 24 11/24/2023 0833   ALT 15 11/24/2023 0833   ALKPHOS 45 11/24/2023 0833   BILITOT 0.3 11/24/2023 0833   GFRNONAA 53 (L) 11/24/2023 0833     No results found.     Assessment & Plan:   1. Lymphedema Recommend:  No surgery or intervention at this point in time.    I have reviewed my previous discussion with the patient regarding  swelling and why it  causes symptoms.  The patient is doing well with compression and will continue wearing graduated compression on a daily basis. The patient will  continue wearing the compression first thing in the morning and removing them in the evening. The patient is instructed specifically not to sleep in the compression.    In addition, behavioral modification including elevation during the day and exercise as tolerated will be continued.    Patient should follow-up on an annual basis   2. Essential (primary) hypertension Continue antihypertensive medications as already ordered, these medications have been reviewed and there are no changes at this time.  The patient notes that sometimes there are somewhat elevated blood pressures.  She is advised to  3. Stage 3a chronic kidney disease (HCC) This can also exacerbate some of the patient's lower extremity edema   Current Outpatient Medications on File Prior to Visit  Medication Sig Dispense Refill   acyclovir  (ZOVIRAX ) 400 MG tablet Take 1 tablet (400 mg total) by mouth 2 (two) times daily. 60 tablet 11   amLODipine  (NORVASC ) 2.5 MG tablet Take 1 tablet (2.5 mg total) by mouth daily.     ascorbic acid (VITAMIN C) 250 MG tablet daily Take 2 tablets every day by oral route.     aspirin 81 MG EC tablet Take 81 mg by mouth daily.     Calcium Carb-Cholecalciferol (CALCIUM 600+D) 600-20 MG-MCG TABS Take 1 tablet by mouth 3 (three) times daily.     dexamethasone  (DECADRON ) 4 MG tablet Take 5 tablets (20 mg total) by mouth See admin instructions. Take 20mg  prior to each Daratumumab  treatment. 30 tablet 1   donepezil  (ARICEPT ) 5 MG tablet Take 10 mg by mouth daily.     fenofibrate  (TRICOR ) 48 MG tablet TAKE 1 TABLET(48 MG) BY MOUTH DAILY 90 tablet 2   fexofenadine (ALLEGRA) 180 MG tablet Take by mouth.     hydrochlorothiazide  (HYDRODIURIL ) 25 MG tablet TAKE 1/2 TABLET BY MOUTH EVERY DAY 45 tablet 3   memantine (NAMENDA) 5 MG tablet Take 5 mg by  mouth 2 (two) times daily.     methimazole  (TAPAZOLE ) 5 MG tablet Take 1 tablet (5 mg total) by mouth daily.  90 tablet 3   metoprolol  succinate (TOPROL -XL) 25 MG 24 hr tablet Take 1 tablet (25 mg total) by mouth daily. 90 tablet 3   montelukast  (SINGULAIR ) 10 MG tablet Take 1 tablet (10 mg total) by mouth See admin instructions. Take 1 tablet one day before Daratumumab  injection. 60 tablet 0   Multiple Vitamins-Minerals (WOMENS MULTIVITAMIN PO) Take 1 tablet by mouth daily.     omeprazole  (PRILOSEC) 20 MG capsule TAKE 1 CAPSULE(20 MG) BY MOUTH DAILY 90 capsule 3   valsartan  (DIOVAN ) 160 MG tablet TAKE 1 TABLET(160 MG) BY MOUTH DAILY 90 tablet 1   prochlorperazine  (COMPAZINE ) 10 MG tablet Take 10 mg by mouth every 6 (six) hours as needed for nausea or vomiting. (Patient not taking: Reported on 12/10/2023)     No current facility-administered medications on file prior to visit.    There are no Patient Instructions on file for this visit. No follow-ups on file.   Shean Gerding E Bell Cai, NP

## 2023-12-15 ENCOUNTER — Encounter (INDEPENDENT_AMBULATORY_CARE_PROVIDER_SITE_OTHER): Payer: Self-pay

## 2023-12-22 ENCOUNTER — Inpatient Hospital Stay

## 2023-12-22 ENCOUNTER — Inpatient Hospital Stay (HOSPITAL_BASED_OUTPATIENT_CLINIC_OR_DEPARTMENT_OTHER): Admitting: Oncology

## 2023-12-22 ENCOUNTER — Telehealth: Payer: Self-pay

## 2023-12-22 ENCOUNTER — Encounter: Payer: Self-pay | Admitting: Oncology

## 2023-12-22 ENCOUNTER — Inpatient Hospital Stay: Attending: Oncology

## 2023-12-22 VITALS — BP 152/78 | HR 69 | Temp 97.9°F | Resp 16 | Wt 152.8 lb

## 2023-12-22 DIAGNOSIS — C9001 Multiple myeloma in remission: Secondary | ICD-10-CM

## 2023-12-22 DIAGNOSIS — I2510 Atherosclerotic heart disease of native coronary artery without angina pectoris: Secondary | ICD-10-CM | POA: Insufficient documentation

## 2023-12-22 DIAGNOSIS — Z823 Family history of stroke: Secondary | ICD-10-CM | POA: Insufficient documentation

## 2023-12-22 DIAGNOSIS — Z8249 Family history of ischemic heart disease and other diseases of the circulatory system: Secondary | ICD-10-CM | POA: Diagnosis not present

## 2023-12-22 DIAGNOSIS — C9000 Multiple myeloma not having achieved remission: Secondary | ICD-10-CM | POA: Diagnosis not present

## 2023-12-22 DIAGNOSIS — Z5112 Encounter for antineoplastic immunotherapy: Secondary | ICD-10-CM | POA: Insufficient documentation

## 2023-12-22 DIAGNOSIS — Z5111 Encounter for antineoplastic chemotherapy: Secondary | ICD-10-CM | POA: Diagnosis not present

## 2023-12-22 DIAGNOSIS — E7800 Pure hypercholesterolemia, unspecified: Secondary | ICD-10-CM | POA: Diagnosis not present

## 2023-12-22 DIAGNOSIS — Z8261 Family history of arthritis: Secondary | ICD-10-CM | POA: Diagnosis not present

## 2023-12-22 DIAGNOSIS — M549 Dorsalgia, unspecified: Secondary | ICD-10-CM | POA: Diagnosis not present

## 2023-12-22 DIAGNOSIS — Z79624 Long term (current) use of inhibitors of nucleotide synthesis: Secondary | ICD-10-CM | POA: Diagnosis not present

## 2023-12-22 DIAGNOSIS — D649 Anemia, unspecified: Secondary | ICD-10-CM | POA: Diagnosis not present

## 2023-12-22 DIAGNOSIS — N1831 Chronic kidney disease, stage 3a: Secondary | ICD-10-CM | POA: Insufficient documentation

## 2023-12-22 DIAGNOSIS — Z9089 Acquired absence of other organs: Secondary | ICD-10-CM | POA: Insufficient documentation

## 2023-12-22 DIAGNOSIS — I10 Essential (primary) hypertension: Secondary | ICD-10-CM | POA: Diagnosis not present

## 2023-12-22 DIAGNOSIS — R5383 Other fatigue: Secondary | ICD-10-CM | POA: Diagnosis not present

## 2023-12-22 DIAGNOSIS — Z79899 Other long term (current) drug therapy: Secondary | ICD-10-CM | POA: Diagnosis not present

## 2023-12-22 DIAGNOSIS — I700 Atherosclerosis of aorta: Secondary | ICD-10-CM | POA: Insufficient documentation

## 2023-12-22 DIAGNOSIS — Z7982 Long term (current) use of aspirin: Secondary | ICD-10-CM | POA: Insufficient documentation

## 2023-12-22 DIAGNOSIS — Z803 Family history of malignant neoplasm of breast: Secondary | ICD-10-CM | POA: Insufficient documentation

## 2023-12-22 LAB — CBC WITH DIFFERENTIAL/PLATELET
Abs Immature Granulocytes: 0.02 10*3/uL (ref 0.00–0.07)
Basophils Absolute: 0 10*3/uL (ref 0.0–0.1)
Basophils Relative: 1 %
Eosinophils Absolute: 0.1 10*3/uL (ref 0.0–0.5)
Eosinophils Relative: 2 %
HCT: 31.7 % — ABNORMAL LOW (ref 36.0–46.0)
Hemoglobin: 10.1 g/dL — ABNORMAL LOW (ref 12.0–15.0)
Immature Granulocytes: 0 %
Lymphocytes Relative: 25 %
Lymphs Abs: 1.6 10*3/uL (ref 0.7–4.0)
MCH: 31.7 pg (ref 26.0–34.0)
MCHC: 31.9 g/dL (ref 30.0–36.0)
MCV: 99.4 fL (ref 80.0–100.0)
Monocytes Absolute: 0.3 10*3/uL (ref 0.1–1.0)
Monocytes Relative: 4 %
Neutro Abs: 4.3 10*3/uL (ref 1.7–7.7)
Neutrophils Relative %: 68 %
Platelets: 235 10*3/uL (ref 150–400)
RBC: 3.19 MIL/uL — ABNORMAL LOW (ref 3.87–5.11)
RDW: 13.1 % (ref 11.5–15.5)
WBC: 6.4 10*3/uL (ref 4.0–10.5)
nRBC: 0 % (ref 0.0–0.2)

## 2023-12-22 LAB — COMPREHENSIVE METABOLIC PANEL WITH GFR
ALT: 15 U/L (ref 0–44)
AST: 26 U/L (ref 15–41)
Albumin: 3.6 g/dL (ref 3.5–5.0)
Alkaline Phosphatase: 48 U/L (ref 38–126)
Anion gap: 9 (ref 5–15)
BUN: 20 mg/dL (ref 8–23)
CO2: 23 mmol/L (ref 22–32)
Calcium: 8.8 mg/dL — ABNORMAL LOW (ref 8.9–10.3)
Chloride: 110 mmol/L (ref 98–111)
Creatinine, Ser: 1.11 mg/dL — ABNORMAL HIGH (ref 0.44–1.00)
GFR, Estimated: 48 mL/min — ABNORMAL LOW (ref >60)
Glucose, Bld: 93 mg/dL (ref 70–99)
Potassium: 3.8 mmol/L (ref 3.5–5.1)
Sodium: 142 mmol/L (ref 135–145)
Total Bilirubin: 0.4 mg/dL (ref 0.0–1.2)
Total Protein: 6.3 g/dL — ABNORMAL LOW (ref 6.5–8.1)

## 2023-12-22 MED ORDER — DARATUMUMAB-HYALURONIDASE-FIHJ 1800-30000 MG-UT/15ML ~~LOC~~ SOLN
1800.0000 mg | Freq: Once | SUBCUTANEOUS | Status: AC
  Administered 2023-12-22: 1800 mg via SUBCUTANEOUS
  Filled 2023-12-22: qty 15

## 2023-12-22 MED ORDER — DIPHENHYDRAMINE HCL 25 MG PO CAPS
50.0000 mg | ORAL_CAPSULE | Freq: Once | ORAL | Status: AC
  Administered 2023-12-22: 50 mg via ORAL
  Filled 2023-12-22: qty 2

## 2023-12-22 MED ORDER — ACETAMINOPHEN 325 MG PO TABS
650.0000 mg | ORAL_TABLET | Freq: Once | ORAL | Status: AC
  Administered 2023-12-22: 650 mg via ORAL
  Filled 2023-12-22: qty 2

## 2023-12-22 MED ORDER — DEXAMETHASONE 4 MG PO TABS: 20.0000 mg | ORAL_TABLET | ORAL | 1 refills | Status: AC

## 2023-12-22 NOTE — Assessment & Plan Note (Signed)
 Multiple myeloma, IgA kappa, complex hyperploid gain of 1q, S/p Daratumumab  Rd.   Revlimid  D1-21,  07/23/22 bone marrow biopsy  3% plasma cell, no M protein.   Labs are reviewed and discussed with patient.  Lab Results  Component Value Date   MPROTEIN Comment (A) 11/24/2023   KPAFRELGTCHN 8.3 11/24/2023   LAMBDASER 3.8 (L) 11/24/2023   KAPLAMBRATIO 2.18 (H) 11/24/2023     M protein is stable. Stable light chain ratio.  Proceed with  Daratumumab  monotherapy maintenance - she takes home supply of Dexamethasone  prior to her Daratumumab .  On asprin 81mg  daily and Acyclovir  400mg  BID.

## 2023-12-22 NOTE — Progress Notes (Signed)
 Hematology/Oncology Progress note [phone] [phone]     CHIEF COMPLAINTS/REASON FOR VISIT:  Follow-up for multiple myeloma  ASSESSMENT & PLAN:   Cancer Staging  Multiple myeloma (HCC) Staging form: Plasma Cell Myeloma and Plasma Cell Disorders, AJCC 8th Edition - Clinical stage from 08/22/2021: RISS Stage II (Beta-2 -microglobulin (mg/L): 4.1, Albumin (g/dL): 2.8, ISS: Stage II, High-risk cytogenetics: Absent, LDH: Normal) - Signed by Timmy Forbes, MD on 09/06/2021   Multiple myeloma (HCC) Multiple myeloma, IgA kappa, complex hyperploid gain of 1q, S/p Daratumumab  Rd.   Revlimid  D1-21,  07/23/22 bone marrow biopsy  3% plasma cell, no M protein.   Labs are reviewed and discussed with patient.  Lab Results  Component Value Date   MPROTEIN Comment (A) 11/24/2023   KPAFRELGTCHN 8.3 11/24/2023   LAMBDASER 3.8 (L) 11/24/2023   KAPLAMBRATIO 2.18 (H) 11/24/2023     M protein is stable. Stable light chain ratio.  Proceed with  Daratumumab  monotherapy maintenance - she takes home supply of Dexamethasone  prior to her Daratumumab .  On asprin 81mg  daily and Acyclovir  400mg  BID.   Encounter for antineoplastic chemotherapy Chemotherapy plan as listed as above.  Normocytic anemia Stable Continue monitor.   Stage 3a chronic kidney disease (HCC) recommend patient to avoid nephrotoxin.  Encourage oral hydration.  No orders of the defined types were placed in this encounter.    Follow up  Lab/MD/Dara in 4 weeks  All questions were answered. The patient knows to call the clinic with any problems, questions or concerns.  Timmy Forbes, MD, PhD [facility] Hematology Oncology 12/22/2023     HISTORY OF PRESENTING ILLNESS:   Stephanie Delacruz is a  89 y.o.  female presents for follow up of multiple myeloma.  Oncology History  Multiple myeloma (HCC)  08/22/2021 Initial Diagnosis   Multiple myeloma (HCC) IgA kappa Initial M protein 5.2, free kappa light chain ratio 89.2,  Kappa/lambda ratio 13.31   08/22/2021 Cancer Staging   Staging form: Plasma Cell Myeloma and Plasma Cell Disorders, AJCC 8th Edition - Clinical stage from 08/22/2021: RISS Stage II (Beta-2 -microglobulin (mg/L): 4.1, Albumin (g/dL): 2.8, ISS: Stage II, High-risk cytogenetics: Absent, LDH: Normal) - Signed by Timmy Forbes, MD on 09/06/2021 Stage prefix: Initial diagnosis Beta 2 microglobulin range (mg/L): 3.5 to 5.49 Albumin range (g/dL): Less than 3.5 Cytogenetics: 1q addition   08/26/2021 - 09/01/2022 Chemotherapy   08/26/21 MYELOMA Daratumumab  SQ q28d  09/06/2021 added Revlimid  10mg  x 14 days.  09/27/2021 starting cycle 2 Revlimid  10 mg 21 days on, 7 days off. Weekly Dexamethasone .    08/26/2021 Bone Marrow Biopsy   Bone marrow biopsy showed hypercellular marrow with plasma cell neoplasm. 67% plasma cells in the aspirate. Cytogenetics showed complex hyperplasia, gain of 1 q.    07/23/2022 Bone Marrow Biopsy   Bone marrow biopsy showed Hypercellular bone marrow for age with trilineage hematopoiesis and 3% plasma cells  Cytogenetics are normal.   04/20/2023 Imaging   PET scan showed  1. Patchy hypermetabolism throughout the spine with focal hypermetabolism in the right third rib and left sacrum, compatible with residual metabolically active multiple myeloma. 2. Hypermetabolic 5 mm right cervical lymph node, indeterminate.Recommend attention on follow-up. 3. 3.6 cm thyroid  nodule, previously evaluated by ultrasound 08/27/2022 and biopsy 10/07/2022. Please refer to pathology report for further evaluation. No follow-up recommended unless clinically warranted. (Ref: J Am Coll Radiol. 2015 Feb;12(2): 143-50). 4. Aortic atherosclerosis (ICD10-I70.0). Coronary artery calcification.    S/p thyroid  lesion biopsy, obtained by endocrinologist. + AUS  INTERVAL  HISTORY Stephanie Delacruz is a 89 y.o. female who has above history reviewed by me today presents for follow up visit for management of multiple  myeloma She was accompanied by her daughter today.  She denies any nausea vomiting diarrhea.   No new complaints.   Review of Systems  Constitutional:  Positive for fatigue. Negative for appetite change, chills and fever.  HENT:   Negative for hearing loss and voice change.   Eyes:  Negative for eye problems.  Respiratory:  Negative for chest tightness and cough.   Cardiovascular:  Negative for chest pain.  Gastrointestinal:  Negative for abdominal distention, abdominal pain and blood in stool.  Endocrine: Negative for hot flashes.  Genitourinary:  Negative for difficulty urinating and frequency.   Musculoskeletal:  Positive for back pain. Negative for arthralgias.  Skin:  Negative for itching and rash.  Neurological:  Negative for extremity weakness.  Hematological:  Negative for adenopathy.  Psychiatric/Behavioral:  Negative for confusion.     MEDICAL HISTORY:  Past Medical History:  Diagnosis Date   Anemia    GERD (gastroesophageal reflux disease)    Hypercholesterolemia    Hypertension    Renal insufficiency     SURGICAL HISTORY: Past Surgical History:  Procedure Laterality Date   TONSILLECTOMY     TUBAL LIGATION      SOCIAL HISTORY: Social History   Socioeconomic History   Marital status: Married    Spouse name: Not on file   Number of children: Not on file   Years of education: Not on file   Highest education level: Not on file  Occupational History   Not on file  Tobacco Use   Smoking status: Never   Smokeless tobacco: Never  Vaping Use   Vaping status: Never Used  Substance and Sexual Activity   Alcohol use: No   Drug use: No   Sexual activity: Not on file  Other Topics Concern   Not on file  Social History Narrative   Lives with husband Royston Cornea and daughter Shelvy Dickens. No pets.    Social Drivers of Corporate investment banker Strain: Not on file  Food Insecurity: No Food Insecurity (12/14/2023)   Hunger Vital Sign    Worried About Running Out of  Food in the Last Year: Never true    Ran Out of Food in the Last Year: Never true  Transportation Needs: No Transportation Needs (12/14/2023)   PRAPARE - Administrator, Civil Service (Medical): No    Lack of Transportation (Non-Medical): No  Physical Activity: Not on file  Stress: Not on file  Social Connections: Not on file  Intimate Partner Violence: Not At Risk (12/14/2023)   Humiliation, Afraid, Rape, and Kick questionnaire    Fear of Current or Ex-Partner: No    Emotionally Abused: No    Physically Abused: No    Sexually Abused: No    FAMILY HISTORY: Family History  Problem Relation Age of Onset   Hypertension Mother    Arthritis Mother    Hypertension Father    Stroke Father    Breast cancer Niece     ALLERGIES:  has no known allergies.  MEDICATIONS:  Current Outpatient Medications  Medication Sig Dispense Refill   acyclovir  (ZOVIRAX ) 400 MG tablet Take 1 tablet (400 mg total) by mouth 2 (two) times daily. 60 tablet 11   amLODipine  (NORVASC ) 2.5 MG tablet Take 1 tablet (2.5 mg total) by mouth daily.     ascorbic acid (VITAMIN C)  250 MG tablet daily Take 2 tablets every day by oral route.     aspirin 81 MG EC tablet Take 81 mg by mouth daily.     Calcium Carb-Cholecalciferol (CALCIUM 600+D) 600-20 MG-MCG TABS Take 1 tablet by mouth 3 (three) times daily.     donepezil  (ARICEPT ) 5 MG tablet Take 10 mg by mouth daily.     fenofibrate  (TRICOR ) 48 MG tablet TAKE 1 TABLET(48 MG) BY MOUTH DAILY 90 tablet 2   fexofenadine (ALLEGRA) 180 MG tablet Take by mouth.     hydrochlorothiazide  (HYDRODIURIL ) 25 MG tablet TAKE 1/2 TABLET BY MOUTH EVERY DAY 45 tablet 3   memantine (NAMENDA) 5 MG tablet Take 5 mg by mouth 2 (two) times daily.     methimazole  (TAPAZOLE ) 5 MG tablet Take 1 tablet (5 mg total) by mouth daily. 90 tablet 3   metoprolol  succinate (TOPROL -XL) 25 MG 24 hr tablet Take 1 tablet (25 mg total) by mouth daily. 90 tablet 3   montelukast  (SINGULAIR ) 10 MG  tablet Take 1 tablet (10 mg total) by mouth See admin instructions. Take 1 tablet one day before Daratumumab  injection. 60 tablet 0   Multiple Vitamins-Minerals (WOMENS MULTIVITAMIN PO) Take 1 tablet by mouth daily.     omeprazole  (PRILOSEC) 20 MG capsule TAKE 1 CAPSULE(20 MG) BY MOUTH DAILY 90 capsule 3   valsartan  (DIOVAN ) 160 MG tablet TAKE 1 TABLET(160 MG) BY MOUTH DAILY 90 tablet 1   dexamethasone  (DECADRON ) 4 MG tablet Take 5 tablets (20 mg total) by mouth See admin instructions. Take 20mg  prior to each Daratumumab  treatment. 30 tablet 1   prochlorperazine  (COMPAZINE ) 10 MG tablet Take 10 mg by mouth every 6 (six) hours as needed for nausea or vomiting. (Patient not taking: Reported on 12/22/2023)     No current facility-administered medications for this visit.     PHYSICAL EXAMINATION: ECOG PERFORMANCE STATUS: 1 - Symptomatic but completely ambulatory Vitals:   12/22/23 0921  BP: (!) 152/78  Pulse: 69  Resp: 16  Temp: 97.9 F (36.6 C)  SpO2: 100%   Filed Weights   12/22/23 0921  Weight: 152 lb 12.8 oz (69.3 kg)    Physical Exam Constitutional:      General: She is not in acute distress. HENT:     Head: Normocephalic and atraumatic.  Eyes:     General: No scleral icterus. Cardiovascular:     Rate and Rhythm: Normal rate and regular rhythm.     Heart sounds: Normal heart sounds.  Pulmonary:     Effort: Pulmonary effort is normal. No respiratory distress.     Breath sounds: No wheezing.  Abdominal:     General: Bowel sounds are normal. There is no distension.     Palpations: Abdomen is soft.  Musculoskeletal:        General: No deformity. Normal range of motion.     Cervical back: Normal range of motion and neck supple.  Skin:    General: Skin is warm and dry.     Findings: No erythema or rash.  Neurological:     Mental Status: She is alert and oriented to person, place, and time. Mental status is at baseline.     Cranial Nerves: No cranial nerve deficit.      Coordination: Coordination normal.  Psychiatric:        Mood and Affect: Mood normal.     LABORATORY DATA:  I have reviewed the data as listed    Latest Ref Rng & Units  12/22/2023    8:44 AM 11/24/2023    8:33 AM 10/27/2023    9:05 AM  CBC  WBC 4.0 - 10.5 K/uL 6.4  5.5  6.0   Hemoglobin 12.0 - 15.0 g/dL 16.1  09.6  04.5   Hematocrit 36.0 - 46.0 % 31.7  32.3  31.7   Platelets 150 - 400 K/uL 235  230  211       Latest Ref Rng & Units 12/22/2023    8:44 AM 11/30/2023    1:37 PM 11/24/2023    8:33 AM  CMP  Glucose 70 - 99 mg/dL 93  93  85   BUN 8 - 23 mg/dL 20  17  19    Creatinine 0.44 - 1.00 mg/dL 4.09  8.11  9.14   Sodium 135 - 145 mmol/L 142  143  138   Potassium 3.5 - 5.1 mmol/L 3.8  4.0  3.5   Chloride 98 - 111 mmol/L 110  107  106   CO2 22 - 32 mmol/L 23  25  23    Calcium 8.9 - 10.3 mg/dL 8.8  9.7  9.5   Total Protein 6.5 - 8.1 g/dL 6.3   6.4   Total Bilirubin 0.0 - 1.2 mg/dL 0.4   0.3   Alkaline Phos 38 - 126 U/L 48   45   AST 15 - 41 U/L 26   24   ALT 0 - 44 U/L 15   15      Iron/TIBC/Ferritin/ %Sat    Component Value Date/Time   IRON 54 07/24/2021 1212   TIBC 277 07/24/2021 1212   FERRITIN 93 07/24/2021 1212   IRONPCTSAT 20 07/24/2021 1212       RADIOGRAPHIC STUDIES: I have personally reviewed the radiological images as listed and agreed with the findings in the report. No results found.

## 2023-12-22 NOTE — Assessment & Plan Note (Signed)
 Stable. Continue monitor

## 2023-12-22 NOTE — Assessment & Plan Note (Signed)
Chemotherapy plan as listed as above. 

## 2023-12-22 NOTE — Telephone Encounter (Signed)
 Copied from CRM [id number]. Topic: General - Other >> Dec 15, 2023  3:12 PM Jenice Mitts wrote: Reason for CRM: Stephanie Delacruz was calling to see if we have received fax

## 2023-12-22 NOTE — Assessment & Plan Note (Signed)
recommend patient to avoid nephrotoxin.  Encourage oral hydration. 

## 2023-12-23 LAB — KAPPA/LAMBDA LIGHT CHAINS
Kappa free light chain: 8.3 mg/L (ref 3.3–19.4)
Kappa, lambda light chain ratio: 2.02 — ABNORMAL HIGH (ref 0.26–1.65)
Lambda free light chains: 4.1 mg/L — ABNORMAL LOW (ref 5.7–26.3)

## 2023-12-24 LAB — MULTIPLE MYELOMA PANEL, SERUM
Albumin SerPl Elph-Mcnc: 3.3 g/dL (ref 2.9–4.4)
Albumin/Glob SerPl: 1.3 (ref 0.7–1.7)
Alpha 1: 0.3 g/dL (ref 0.0–0.4)
Alpha2 Glob SerPl Elph-Mcnc: 0.7 g/dL (ref 0.4–1.0)
B-Globulin SerPl Elph-Mcnc: 1.2 g/dL (ref 0.7–1.3)
Gamma Glob SerPl Elph-Mcnc: 0.4 g/dL (ref 0.4–1.8)
Globulin, Total: 2.6 g/dL (ref 2.2–3.9)
IgA: 151 mg/dL (ref 64–422)
IgG (Immunoglobin G), Serum: 495 mg/dL — ABNORMAL LOW (ref 586–1602)
IgM (Immunoglobulin M), Srm: 21 mg/dL — ABNORMAL LOW (ref 26–217)
Total Protein ELP: 5.9 g/dL — ABNORMAL LOW (ref 6.0–8.5)

## 2023-12-24 NOTE — Telephone Encounter (Signed)
 Mobility assessment completed and placed in folder for signature

## 2023-12-25 NOTE — Telephone Encounter (Signed)
 Signed and placed in box.

## 2023-12-25 NOTE — Telephone Encounter (Signed)
 Faxed

## 2023-12-28 ENCOUNTER — Telehealth: Payer: Self-pay

## 2023-12-28 ENCOUNTER — Other Ambulatory Visit: Payer: Self-pay | Admitting: Licensed Clinical Social Worker

## 2023-12-28 DIAGNOSIS — H6121 Impacted cerumen, right ear: Secondary | ICD-10-CM | POA: Diagnosis not present

## 2023-12-28 DIAGNOSIS — R2681 Unsteadiness on feet: Secondary | ICD-10-CM

## 2023-12-28 DIAGNOSIS — H906 Mixed conductive and sensorineural hearing loss, bilateral: Secondary | ICD-10-CM | POA: Diagnosis not present

## 2023-12-28 NOTE — Telephone Encounter (Signed)
 Copied from CRM [id number]. Topic: Referral - Question >> Dec 28, 2023  2:18 PM Melissa C wrote: Reason for CRM: Received call from [facility]- according to patient's daughter, patient was recently referred for personal assistance. Personal Assistance services are not paid for unless patient has Medicaid and the patient only has Medicare. The question is if patient can be referred for Home Health services so that way it can be covered under Medicare and work much better. I let her know I didn't know the doctor was wanting for the patient but I would certainly put the question forward. Please contact patient's daughter to let her know/discuss questions regarding this referral.

## 2023-12-28 NOTE — Patient Instructions (Addendum)
 Visit Information  Thank you for taking time to visit with me today. Please don't hesitate to contact me if I can be of assistance to you before our next scheduled appointment.  Your next care management appointment is by telephone on 01/11/2024 at 230pm  Telephone follow up appointment date/time:  01/11/2024  Please call the care guide team at [phone] if you need to cancel, schedule, or reschedule an appointment.   Please call 911 if you are experiencing a Mental Health or Behavioral Health Crisis or need someone to talk to.  Fletcher Humble MSW, LCSW Licensed Clinical Social Worker  [facility], Population Health Direct Dial: [phone]  Fax: [phone]

## 2023-12-28 NOTE — Patient Outreach (Signed)
 Complex Care Management   Visit Note  12/28/2023  Name:  Stephanie Delacruz MRN: [id number] DOB: 1934-05-04  Situation: Referral received for Complex Care Management related to personal care assistance I obtained verbal consent from Caregiver.  Visit completed with T. Berch-Colon  on the phone. Advise pt daughter on pca options. LCSW A Darcella Shiffman contacted pcp offer spk with Melissa requested referral home health referral to assistance with unsteady gait. [facility] referral sent via fax. Provided pt daughter with contact info for [facility]  Background:   Past Medical History:  Diagnosis Date   Anemia    GERD (gastroesophageal reflux disease)    Hypercholesterolemia    Hypertension    Renal insufficiency     Assessment: Patient Reported Symptoms:  Cognitive Cognitive Status: Unable to Assess Cognitive/Intellectual Conditions Management [RPT]: Not Assessed      Neurological Neurological Review of Symptoms: Not assessed    HEENT HEENT Symptoms Reported: Not assessed      Cardiovascular Cardiovascular Symptoms Reported: No symptoms reported    Respiratory Respiratory Symptoms Reported: Not assesed    Endocrine Patient reports the following symptoms related to hypoglycemia or hyperglycemia : Not assessed    Gastrointestinal Gastrointestinal Symptoms Reported: Not assessed      Genitourinary Genitourinary Symptoms Reported: Not assessed    Integumentary Integumentary Symptoms Reported: No symptoms reported    Musculoskeletal Musculoskelatal Symptoms Reviewed: Not assessed Musculoskeletal Conditions: Unsteady gait      Psychosocial       Quality of Family Relationships: supportive, helpful Do you feel physically threatened by others?: No      03/25/2023   10:19 AM  Depression screen PHQ 2/9  Decreased Interest 0  Down, Depressed, Hopeless 0  PHQ - 2 Score 0  Altered sleeping 0  Tired, decreased energy 0  Change in appetite 0  Feeling bad or failure about yourself   0  Trouble concentrating 0  Moving slowly or fidgety/restless 0  Suicidal thoughts 0  PHQ-9 Score 0  Difficult doing work/chores Not difficult at all    There were no vitals filed for this visit.  Medications Reviewed Today     Reviewed by Fletcher Humble, LCSW (Social Worker) on 12/28/23 at 1701  Med List Status: <None>   Medication Order Taking? Sig Documenting Provider Last Dose Status Informant  acyclovir  (ZOVIRAX ) 400 MG tablet [id number] No Take 1 tablet (400 mg total) by mouth 2 (two) times daily. Timmy Forbes, MD Taking Active   amLODipine  (NORVASC ) 2.5 MG tablet [id number] No Take 1 tablet (2.5 mg total) by mouth daily. Constancia Delton, MD Taking Active   ascorbic acid (VITAMIN C) 250 MG tablet [id number] No daily Take 2 tablets every day by oral route. [provider] Taking Active   aspirin 81 MG EC tablet [id number] No Take 81 mg by mouth daily. [provider] Taking Active   Calcium Carb-Cholecalciferol (CALCIUM 600+D) 600-20 MG-MCG TABS [id number] No Take 1 tablet by mouth 3 (three) times daily. [provider] Taking Active   dexamethasone  (DECADRON ) 4 MG tablet [id number]  Take 5 tablets (20 mg total) by mouth See admin instructions. Take 20mg  prior to each Daratumumab  treatment. Timmy Forbes, MD  Active   donepezil  (ARICEPT ) 5 MG tablet [id number] No Take 10 mg by mouth daily. [provider] Taking Active   fenofibrate  (TRICOR ) 48 MG tablet [id number] No TAKE 1 TABLET(48 MG) BY MOUTH DAILY Dellar Fenton, MD Taking Active   fexofenadine (ALLEGRA) 180 MG tablet [id number] No Take by  mouth. [provider] Taking Active   hydrochlorothiazide  (HYDRODIURIL ) 25 MG tablet [id number] No TAKE 1/2 TABLET BY MOUTH EVERY DAY Dellar Fenton, MD Taking Active   memantine [facility]) 5 MG tablet [id number] No Take 5 mg by mouth 2 (two) times daily. [provider] Taking Active   methimazole  (TAPAZOLE ) 5 MG tablet [id number] No Take 1 tablet (5 mg  total) by mouth daily. Shamleffer, Ibtehal Jaralla, MD Taking Active   metoprolol  succinate (TOPROL -XL) 25 MG 24 hr tablet [id number] No Take 1 tablet (25 mg total) by mouth daily. Constancia Delton, MD Taking Active   montelukast  (SINGULAIR ) 10 MG tablet [id number] No Take 1 tablet (10 mg total) by mouth See admin instructions. Take 1 tablet one day before Daratumumab  injection. Timmy Forbes, MD Taking Active   Multiple Vitamins-Minerals (WOMENS MULTIVITAMIN PO) [id number] No Take 1 tablet by mouth daily. [provider] Taking Active   omeprazole  (PRILOSEC) 20 MG capsule [id number] No TAKE 1 CAPSULE(20 MG) BY MOUTH DAILY Dellar Fenton, MD Taking Active   prochlorperazine  (COMPAZINE ) 10 MG tablet [id number] No Take 10 mg by mouth every 6 (six) hours as needed for nausea or vomiting.  Patient not taking: Reported on 12/22/2023   [provider] Not Taking Active   valsartan  (DIOVAN ) 160 MG tablet [id number] No TAKE 1 TABLET(160 MG) BY MOUTH DAILY Dellar Fenton, MD Taking Active   Med List Note Thaddeus Filippo, RPH-CPP 08/27/21 1119): Revlimid  refilled at [facility]            Recommendation:   Referral to: contacted pcp to submit referral for home health care to assistance with unsteady gait.   Follow Up Plan:   Telephone follow up appointment date/time:  01/11/2024 2pm Fletcher Humble MSW, LCSW Licensed Clinical Social Worker  [facility], Population Health Direct Dial: [phone]  Fax: [phone]

## 2023-12-29 NOTE — Telephone Encounter (Signed)
 Per note from Child psychotherapist, requesting home health referral to help with unsteady gait. She does not qualify for PCA services through social work because she has medicare. This is why Child psychotherapist is recommending home health.

## 2023-12-29 NOTE — Telephone Encounter (Signed)
Order placed for home health referral.  

## 2024-01-01 ENCOUNTER — Telehealth: Payer: Self-pay

## 2024-01-01 DIAGNOSIS — I129 Hypertensive chronic kidney disease with stage 1 through stage 4 chronic kidney disease, or unspecified chronic kidney disease: Secondary | ICD-10-CM | POA: Diagnosis not present

## 2024-01-01 DIAGNOSIS — Z7982 Long term (current) use of aspirin: Secondary | ICD-10-CM | POA: Diagnosis not present

## 2024-01-01 DIAGNOSIS — F0390 Unspecified dementia without behavioral disturbance: Secondary | ICD-10-CM | POA: Diagnosis not present

## 2024-01-01 DIAGNOSIS — D630 Anemia in neoplastic disease: Secondary | ICD-10-CM | POA: Diagnosis not present

## 2024-01-01 DIAGNOSIS — Z556 Problems related to health literacy: Secondary | ICD-10-CM | POA: Diagnosis not present

## 2024-01-01 DIAGNOSIS — E7800 Pure hypercholesterolemia, unspecified: Secondary | ICD-10-CM | POA: Diagnosis not present

## 2024-01-01 DIAGNOSIS — N1831 Chronic kidney disease, stage 3a: Secondary | ICD-10-CM | POA: Diagnosis not present

## 2024-01-01 DIAGNOSIS — E042 Nontoxic multinodular goiter: Secondary | ICD-10-CM | POA: Diagnosis not present

## 2024-01-01 DIAGNOSIS — D631 Anemia in chronic kidney disease: Secondary | ICD-10-CM | POA: Diagnosis not present

## 2024-01-01 DIAGNOSIS — Z9089 Acquired absence of other organs: Secondary | ICD-10-CM | POA: Diagnosis not present

## 2024-01-01 DIAGNOSIS — K219 Gastro-esophageal reflux disease without esophagitis: Secondary | ICD-10-CM | POA: Diagnosis not present

## 2024-01-01 DIAGNOSIS — C9001 Multiple myeloma in remission: Secondary | ICD-10-CM | POA: Diagnosis not present

## 2024-01-01 NOTE — Telephone Encounter (Signed)
 Copied from CRM [id number]. Topic: Clinical - Home Health Verbal Orders >> Jan 01, 2024 12:49 PM Bambi Bonine D wrote: Caller/Agency: Shalini,PT, with [facility] Callback Number: [phone], secure line Service Requested: Physical Therapy Frequency: once a week for 8 weeks Any new concerns about the patient? No

## 2024-01-04 ENCOUNTER — Ambulatory Visit (INDEPENDENT_AMBULATORY_CARE_PROVIDER_SITE_OTHER): Payer: Medicare Other | Admitting: Internal Medicine

## 2024-01-04 ENCOUNTER — Encounter: Payer: Self-pay | Admitting: Internal Medicine

## 2024-01-04 VITALS — BP 120/74 | HR 66 | Ht 62.0 in | Wt 153.0 lb

## 2024-01-04 DIAGNOSIS — E042 Nontoxic multinodular goiter: Secondary | ICD-10-CM

## 2024-01-04 DIAGNOSIS — E0590 Thyrotoxicosis, unspecified without thyrotoxic crisis or storm: Secondary | ICD-10-CM | POA: Diagnosis not present

## 2024-01-04 MED ORDER — METHIMAZOLE 5 MG PO TABS: 5.0000 mg | ORAL_TABLET | Freq: Every day | ORAL | 3 refills | Status: DC

## 2024-01-04 NOTE — Progress Notes (Signed)
 Name: Stephanie Delacruz  MRN/ DOB: [id number], 1934/07/18    Age/ Sex: 89 y.o., female    PCP: Dellar Fenton, MD   Reason for Endocrinology Evaluation: Multinodular goiter     Date of Initial Endocrinology Evaluation: 09/16/2022    HPI: Stephanie Delacruz is a 89 y.o. female with a past medical history of multiple myeloma, HTN, and dyslipidemia. The patient presented for initial endocrinology clinic visit on 09/16/2022 for consultative assistance with her multinodular goiter  Pt has been noted with multinodular goiter on ultrasound 07/2022 with two nodules meeting FNA criteria.  Patient on chemotherapy   TRAb undetectable  She is s/p FNA of the isthmic nodule with cytology report consistent with atypia of uncertain significance (Bethesda category III) on 10/07/2022, ThyroSeq was reported as negative but limited  She is also s/p benign FNA of left mid thyroid  nodule, Bethesda category II  No family history of thyroid  disease  SUBJECTIVE:    Today (01/04/24): Stephanie Delacruz is here for follow-up on multinodular goiter   Patient is accompanied by her daughter Shelvy Dickens She continues to follow-up with oncology for the treatment of multiple myeloma, she is on maintenance therapy with daratumumab   Weight continues to fluctuate  No local neck symptoms  Denies palpitations  Denies constipation nor diarrhea    No biotin intake , except for MVI   Methimazole  5 mg daily      HISTORY:  Past Medical History:  Past Medical History:  Diagnosis Date   Anemia    GERD (gastroesophageal reflux disease)    Hypercholesterolemia    Hypertension    Renal insufficiency    Past Surgical History:  Past Surgical History:  Procedure Laterality Date   TONSILLECTOMY     TUBAL LIGATION      Social History:  reports that she has never smoked. She has never used smokeless tobacco. She reports that she does not drink alcohol and does not use drugs. Family History: family history includes Arthritis in  her mother; Breast cancer in her niece; Hypertension in her father and mother; Stroke in her father.   HOME MEDICATIONS: Allergies as of 01/04/2024   No Known Allergies      Medication List        Accurate as of January 04, 2024 10:26 AM. If you have any questions, ask your nurse or doctor.          acyclovir  400 MG tablet Commonly known as: ZOVIRAX  Take 1 tablet (400 mg total) by mouth 2 (two) times daily.   amLODipine  2.5 MG tablet Commonly known as: NORVASC  Take 1 tablet (2.5 mg total) by mouth daily.   ascorbic acid 250 MG tablet Commonly known as: VITAMIN C daily Take 2 tablets every day by oral route.   aspirin EC 81 MG tablet Take 81 mg by mouth daily.   Calcium 600+D 600-20 MG-MCG Tabs Generic drug: Calcium Carb-Cholecalciferol Take 1 tablet by mouth 3 (three) times daily.   dexamethasone  4 MG tablet Commonly known as: DECADRON  Take 5 tablets (20 mg total) by mouth See admin instructions. Take 20mg  prior to each Daratumumab  treatment.   donepezil  5 MG tablet Commonly known as: ARICEPT  Take 10 mg by mouth daily.   fenofibrate  48 MG tablet Commonly known as: TRICOR  TAKE 1 TABLET(48 MG) BY MOUTH DAILY   fexofenadine 180 MG tablet Commonly known as: ALLEGRA Take by mouth.   hydrochlorothiazide  25 MG tablet Commonly known as: HYDRODIURIL  TAKE 1/2 TABLET BY MOUTH EVERY DAY  memantine 5 MG tablet Commonly known as: NAMENDA Take 5 mg by mouth 2 (two) times daily.   methimazole  5 MG tablet Commonly known as: TAPAZOLE  Take 1 tablet (5 mg total) by mouth daily.   metoprolol  succinate 25 MG 24 hr tablet Commonly known as: TOPROL -XL Take 1 tablet (25 mg total) by mouth daily.   montelukast  10 MG tablet Commonly known as: Singulair  Take 1 tablet (10 mg total) by mouth See admin instructions. Take 1 tablet one day before Daratumumab  injection.   omeprazole  20 MG capsule Commonly known as: PRILOSEC TAKE 1 CAPSULE(20 MG) BY MOUTH DAILY    prochlorperazine  10 MG tablet Commonly known as: COMPAZINE  Take 10 mg by mouth every 6 (six) hours as needed for nausea or vomiting.   valsartan  160 MG tablet Commonly known as: DIOVAN  TAKE 1 TABLET(160 MG) BY MOUTH DAILY   WOMENS MULTIVITAMIN PO Take 1 tablet by mouth daily.          REVIEW OF SYSTEMS: A comprehensive ROS was conducted with the patient and is negative except as per HPI   OBJECTIVE:  VS: BP 120/74 (BP Location: Left Arm, Patient Position: Sitting, Cuff Size: Normal)   Pulse 66   Ht 5\' 2"  (1.575 m)   Wt 153 lb (69.4 kg)   SpO2 97%   BMI 27.98 kg/m    Wt Readings from Last 3 Encounters:  01/04/24 153 lb (69.4 kg)  12/22/23 152 lb 12.8 oz (69.3 kg)  12/10/23 150 lb (68 kg)     EXAM: General: Pt appears well and is in NAD  Neck: General: Supple without adenopathy. Thyroid : Isthmic nodule appreciated   Lungs: Clear with good BS bilat   Heart: Auscultation: RRR.  Extremities:  BL LE: No pretibial edema , swelling noted around the ankles  Mental Status: Judgment, insight: Intact Orientation: Oriented to time, place, and person Mood and affect: No depression, anxiety, or agitation     DATA REVIEWED:   Latest Reference Range & Units 11/30/23 13:37  TSH 0.35 - 5.50 uIU/mL 1.47     Thyroid  ultrasound 08/27/2022  Estimated total number of nodules >/= 1 cm: 3   Number of spongiform nodules >/=  2 cm not described below (TR1): 0   Number of mixed cystic and solid nodules >/= 1.5 cm not described below (TR2): 0   _________________________________________________________   Nodule # 1: Approximately 1.2 cm spongiform nodule in the thyroid  isthmus is considered sonographically low risk/benign. No imaging follow-up recommended.   Nodule # 2:   Location: Isthmus; Inferior   Maximum size: 3.2 cm; Other 2 dimensions: 2.4 x 3.1 cm   Composition: solid/almost completely solid (2)   Echogenicity: isoechoic (1)   Shape: not taller-than-wide  (0)   Margins: smooth (0)   Echogenic foci: none (0)   ACR TI-RADS total points: 3.   ACR TI-RADS risk category: TR3 (3 points).   ACR TI-RADS recommendations:   **Given size (>/= 2.5 cm) and appearance, fine needle aspiration of this mildly suspicious nodule should be considered based on TI-RADS criteria.   _________________________________________________________   Nodule # 3: Anechoic cyst with peripheral colloid artifact in the right mid gland measures up to 1.7 cm. Findings are consistent with a benign colloid nodule.   Nodule # 4:   Location: Left; Mid   Maximum size: 2.7 cm; Other 2 dimensions: 1.5 x 2.1 cm   Composition: solid/almost completely solid (2)   Echogenicity: isoechoic (1)   Shape: not taller-than-wide (0)   Margins:  ill-defined (0)   Echogenic foci: none (0)   ACR TI-RADS total points: 3.   ACR TI-RADS risk category: TR3 (3 points).   ACR TI-RADS recommendations:   **Given size (>/= 2.5 cm) and appearance, fine needle aspiration of this mildly suspicious nodule should be considered based on TI-RADS criteria.   _________________________________________________________   IMPRESSION: 1. Enlarged, heterogeneous and multinodular thyroid  gland most consistent with multinodular goiter. 2. Large TI-RADS category 3 nodules in the thyroid  isthmus (labeled # 2) and the left mid gland (labeled # 4) both meet criteria to consider fine-needle aspiration biopsy.  FNA  left mid nodule 10/07/2022    DIAGNOSIS:  A. THYROID  NODULE, LEFT MID; FNA:  - BENIGN (BETHESDA 2)  - FOLLICULAR EPITHELIAL CELLS, ABUNDANT COLLOID AND LYMPHOCYTES.     FNA left inferior isthmic  10/07/2022 Specimen Submitted:  A. Isthmus, inferior; FNA   DIAGNOSIS:  A. THYROID , ISTHMUS (INFERIOR); FNA  - ATYPICAL FOLLICULAR CELLS OF UNCERTAIN SIGNIFICANCE (AUS) BETHESDA 3.  - BLOOD, HEMOSIDERIN LADEN HISTIOCYTES AND A FEW LYMPHOCYTES.   Thyroseq : negative     ASSESSMENT/PLAN/RECOMMENDATIONS:   Multinodular goiter  -No local neck symptoms -Most likely reason for hyperthyroid -She is s/p benign FNA of the left mid thyroid  nodule as well as atypia of undetermined significance of the isthmic nodule with benign ThyroSeq 09/2022 -We will repeat thyroid  ultrasound by next visit   2. Hyperthyroidism:  -Most likely due to autonomous thyroid  nodule - Repeat TFTs through oncology was reviewed from May, 2025 with normal TSH, no change   Medication Continue methimazole  5 mg daily   Follow-up in 6 months  Signed electronically by: Natale Bail, MD  [facility] Endocrinology  [facility] [address] [place] [place], [place] [address] Phone: [phone] FAX: [phone]   CC: Dellar Fenton, MD [address] [place] [address] Phone: [phone] Fax: [phone]   Return to Endocrinology clinic as below: Future Appointments  Date Time Provider Department Center  01/04/2024 10:30 AM Donice Alperin, Julian Obey, MD [facility] None  01/11/2024  2:30 PM Fletcher Humble, LCSW [facility] None  01/19/2024  8:45 AM [facility] None  01/19/2024  9:15 AM Timmy Forbes, MD [facility] None  01/19/2024  9:45 AM [facility] INFUSION CHAIR 4 [facility] None  04/04/2024 10:00 AM Dellar Fenton, MD [facility]  12/09/2024 11:00 AM Valene Gash, NP [facility] None

## 2024-01-04 NOTE — Telephone Encounter (Signed)
 Ok for [employer]

## 2024-01-04 NOTE — Telephone Encounter (Signed)
 ok

## 2024-01-04 NOTE — Telephone Encounter (Signed)
 Verbal orders given

## 2024-01-07 DIAGNOSIS — R269 Unspecified abnormalities of gait and mobility: Secondary | ICD-10-CM | POA: Diagnosis not present

## 2024-01-08 DIAGNOSIS — F0390 Unspecified dementia without behavioral disturbance: Secondary | ICD-10-CM | POA: Diagnosis not present

## 2024-01-08 DIAGNOSIS — K219 Gastro-esophageal reflux disease without esophagitis: Secondary | ICD-10-CM | POA: Diagnosis not present

## 2024-01-08 DIAGNOSIS — Z9089 Acquired absence of other organs: Secondary | ICD-10-CM | POA: Diagnosis not present

## 2024-01-08 DIAGNOSIS — E042 Nontoxic multinodular goiter: Secondary | ICD-10-CM | POA: Diagnosis not present

## 2024-01-08 DIAGNOSIS — D631 Anemia in chronic kidney disease: Secondary | ICD-10-CM | POA: Diagnosis not present

## 2024-01-08 DIAGNOSIS — Z556 Problems related to health literacy: Secondary | ICD-10-CM | POA: Diagnosis not present

## 2024-01-08 DIAGNOSIS — E7800 Pure hypercholesterolemia, unspecified: Secondary | ICD-10-CM | POA: Diagnosis not present

## 2024-01-08 DIAGNOSIS — D630 Anemia in neoplastic disease: Secondary | ICD-10-CM | POA: Diagnosis not present

## 2024-01-08 DIAGNOSIS — C9001 Multiple myeloma in remission: Secondary | ICD-10-CM | POA: Diagnosis not present

## 2024-01-08 DIAGNOSIS — Z7982 Long term (current) use of aspirin: Secondary | ICD-10-CM | POA: Diagnosis not present

## 2024-01-08 DIAGNOSIS — N1831 Chronic kidney disease, stage 3a: Secondary | ICD-10-CM | POA: Diagnosis not present

## 2024-01-08 DIAGNOSIS — I129 Hypertensive chronic kidney disease with stage 1 through stage 4 chronic kidney disease, or unspecified chronic kidney disease: Secondary | ICD-10-CM | POA: Diagnosis not present

## 2024-01-11 ENCOUNTER — Other Ambulatory Visit: Payer: Self-pay | Admitting: Licensed Clinical Social Worker

## 2024-01-11 NOTE — Patient Instructions (Signed)

## 2024-01-11 NOTE — Patient Outreach (Signed)
 Complex Care Management   Visit Note  01/11/2024  Name:  Stephanie Delacruz MRN: [id number] DOB: Nov 04, 1933  Situation: Referral received for Complex Care Management related to Dementia I obtained verbal consent from Caregiver.  Visit completed with [name]  on the phone [name] reports home health services started 01/01/2024 and she does not have any further questions or concerns.  Background:   Past Medical History:  Diagnosis Date   Anemia    GERD (gastroesophageal reflux disease)    Hypercholesterolemia    Hypertension    Renal insufficiency     Assessment: Patient Reported Symptoms:  Cognitive Cognitive Status: Unable to Assess Cognitive/Intellectual Conditions Management [RPT]: Not Assessed      Neurological Neurological Review of Symptoms: No symptoms reported Neurological Conditions: Alzheimer's disease  HEENT HEENT Symptoms Reported: Not assessed      Cardiovascular Cardiovascular Symptoms Reported: No symptoms reported Does patient have uncontrolled Hypertension?: No    Respiratory Respiratory Symptoms Reported: No symptoms reported    Endocrine Patient reports the following symptoms related to hypoglycemia or hyperglycemia : No symptoms reported Is patient diabetic?: No    Gastrointestinal Gastrointestinal Symptoms Reported: Not assessed      Genitourinary Genitourinary Symptoms Reported: No symptoms reported    Integumentary Integumentary Symptoms Reported: No symptoms reported    Musculoskeletal Musculoskelatal Symptoms Reviewed: No symptoms reported        Psychosocial       Quality of Family Relationships: helpful, involved, supportive Do you feel physically threatened by others?: No      03/25/2023   10:19 AM  Depression screen PHQ 2/9  Decreased Interest 0  Down, Depressed, Hopeless 0  PHQ - 2 Score 0  Altered sleeping 0  Tired, decreased energy 0  Change in appetite 0  Feeling bad or failure about yourself  0  Trouble  concentrating 0  Moving slowly or fidgety/restless 0  Suicidal thoughts 0  PHQ-9 Score 0  Difficult doing work/chores Not difficult at all    There were no vitals filed for this visit.  Medications Reviewed Today   Medications were not reviewed in this encounter     Recommendation:   Continue Current Plan of Care  Follow Up Plan:   Patient has met all care management goals. Care Management case will be closed. Patient has been provided contact information should new needs arise.   Fletcher Humble MSW, LCSW Licensed Clinical Social Worker  [facility], Population Health Direct Dial: [phone]  Fax: [phone]

## 2024-01-12 DIAGNOSIS — Z556 Problems related to health literacy: Secondary | ICD-10-CM

## 2024-01-12 DIAGNOSIS — K219 Gastro-esophageal reflux disease without esophagitis: Secondary | ICD-10-CM

## 2024-01-12 DIAGNOSIS — D631 Anemia in chronic kidney disease: Secondary | ICD-10-CM

## 2024-01-12 DIAGNOSIS — Z7982 Long term (current) use of aspirin: Secondary | ICD-10-CM

## 2024-01-12 DIAGNOSIS — E7800 Pure hypercholesterolemia, unspecified: Secondary | ICD-10-CM

## 2024-01-12 DIAGNOSIS — E042 Nontoxic multinodular goiter: Secondary | ICD-10-CM

## 2024-01-12 DIAGNOSIS — Z9089 Acquired absence of other organs: Secondary | ICD-10-CM

## 2024-01-12 DIAGNOSIS — N1831 Chronic kidney disease, stage 3a: Secondary | ICD-10-CM | POA: Diagnosis not present

## 2024-01-12 DIAGNOSIS — C9001 Multiple myeloma in remission: Secondary | ICD-10-CM | POA: Diagnosis not present

## 2024-01-12 DIAGNOSIS — D630 Anemia in neoplastic disease: Secondary | ICD-10-CM | POA: Diagnosis not present

## 2024-01-12 DIAGNOSIS — F0390 Unspecified dementia without behavioral disturbance: Secondary | ICD-10-CM

## 2024-01-12 DIAGNOSIS — I129 Hypertensive chronic kidney disease with stage 1 through stage 4 chronic kidney disease, or unspecified chronic kidney disease: Secondary | ICD-10-CM | POA: Diagnosis not present

## 2024-01-14 DIAGNOSIS — Z556 Problems related to health literacy: Secondary | ICD-10-CM | POA: Diagnosis not present

## 2024-01-14 DIAGNOSIS — E7800 Pure hypercholesterolemia, unspecified: Secondary | ICD-10-CM | POA: Diagnosis not present

## 2024-01-14 DIAGNOSIS — D631 Anemia in chronic kidney disease: Secondary | ICD-10-CM | POA: Diagnosis not present

## 2024-01-14 DIAGNOSIS — D630 Anemia in neoplastic disease: Secondary | ICD-10-CM | POA: Diagnosis not present

## 2024-01-14 DIAGNOSIS — K219 Gastro-esophageal reflux disease without esophagitis: Secondary | ICD-10-CM | POA: Diagnosis not present

## 2024-01-14 DIAGNOSIS — Z7982 Long term (current) use of aspirin: Secondary | ICD-10-CM | POA: Diagnosis not present

## 2024-01-14 DIAGNOSIS — F0390 Unspecified dementia without behavioral disturbance: Secondary | ICD-10-CM | POA: Diagnosis not present

## 2024-01-14 DIAGNOSIS — E042 Nontoxic multinodular goiter: Secondary | ICD-10-CM | POA: Diagnosis not present

## 2024-01-14 DIAGNOSIS — C9001 Multiple myeloma in remission: Secondary | ICD-10-CM | POA: Diagnosis not present

## 2024-01-14 DIAGNOSIS — N1831 Chronic kidney disease, stage 3a: Secondary | ICD-10-CM | POA: Diagnosis not present

## 2024-01-14 DIAGNOSIS — I129 Hypertensive chronic kidney disease with stage 1 through stage 4 chronic kidney disease, or unspecified chronic kidney disease: Secondary | ICD-10-CM | POA: Diagnosis not present

## 2024-01-14 DIAGNOSIS — Z9089 Acquired absence of other organs: Secondary | ICD-10-CM | POA: Diagnosis not present

## 2024-01-19 ENCOUNTER — Inpatient Hospital Stay

## 2024-01-19 ENCOUNTER — Encounter: Payer: Self-pay | Admitting: Oncology

## 2024-01-19 ENCOUNTER — Inpatient Hospital Stay: Attending: Oncology

## 2024-01-19 ENCOUNTER — Inpatient Hospital Stay (HOSPITAL_BASED_OUTPATIENT_CLINIC_OR_DEPARTMENT_OTHER): Admitting: Oncology

## 2024-01-19 VITALS — BP 150/76 | HR 71 | Temp 97.5°F | Resp 18 | Wt 153.0 lb

## 2024-01-19 DIAGNOSIS — Z79624 Long term (current) use of inhibitors of nucleotide synthesis: Secondary | ICD-10-CM | POA: Insufficient documentation

## 2024-01-19 DIAGNOSIS — C9000 Multiple myeloma not having achieved remission: Secondary | ICD-10-CM | POA: Diagnosis not present

## 2024-01-19 DIAGNOSIS — Z803 Family history of malignant neoplasm of breast: Secondary | ICD-10-CM | POA: Diagnosis not present

## 2024-01-19 DIAGNOSIS — Z5111 Encounter for antineoplastic chemotherapy: Secondary | ICD-10-CM

## 2024-01-19 DIAGNOSIS — I10 Essential (primary) hypertension: Secondary | ICD-10-CM | POA: Insufficient documentation

## 2024-01-19 DIAGNOSIS — I2510 Atherosclerotic heart disease of native coronary artery without angina pectoris: Secondary | ICD-10-CM | POA: Insufficient documentation

## 2024-01-19 DIAGNOSIS — Z5112 Encounter for antineoplastic immunotherapy: Secondary | ICD-10-CM | POA: Insufficient documentation

## 2024-01-19 DIAGNOSIS — Z9089 Acquired absence of other organs: Secondary | ICD-10-CM | POA: Insufficient documentation

## 2024-01-19 DIAGNOSIS — Z8249 Family history of ischemic heart disease and other diseases of the circulatory system: Secondary | ICD-10-CM | POA: Diagnosis not present

## 2024-01-19 DIAGNOSIS — R5383 Other fatigue: Secondary | ICD-10-CM | POA: Insufficient documentation

## 2024-01-19 DIAGNOSIS — Z823 Family history of stroke: Secondary | ICD-10-CM | POA: Insufficient documentation

## 2024-01-19 DIAGNOSIS — D649 Anemia, unspecified: Secondary | ICD-10-CM | POA: Diagnosis not present

## 2024-01-19 DIAGNOSIS — N1831 Chronic kidney disease, stage 3a: Secondary | ICD-10-CM

## 2024-01-19 DIAGNOSIS — Z7982 Long term (current) use of aspirin: Secondary | ICD-10-CM | POA: Diagnosis not present

## 2024-01-19 DIAGNOSIS — C9001 Multiple myeloma in remission: Secondary | ICD-10-CM | POA: Diagnosis not present

## 2024-01-19 DIAGNOSIS — Z79899 Other long term (current) drug therapy: Secondary | ICD-10-CM | POA: Diagnosis not present

## 2024-01-19 DIAGNOSIS — I700 Atherosclerosis of aorta: Secondary | ICD-10-CM | POA: Insufficient documentation

## 2024-01-19 DIAGNOSIS — E7800 Pure hypercholesterolemia, unspecified: Secondary | ICD-10-CM | POA: Diagnosis not present

## 2024-01-19 DIAGNOSIS — M549 Dorsalgia, unspecified: Secondary | ICD-10-CM | POA: Diagnosis not present

## 2024-01-19 DIAGNOSIS — Z8261 Family history of arthritis: Secondary | ICD-10-CM | POA: Insufficient documentation

## 2024-01-19 LAB — COMPREHENSIVE METABOLIC PANEL WITH GFR
ALT: 16 U/L (ref 0–44)
AST: 25 U/L (ref 15–41)
Albumin: 3.8 g/dL (ref 3.5–5.0)
Alkaline Phosphatase: 46 U/L (ref 38–126)
Anion gap: 9 (ref 5–15)
BUN: 19 mg/dL (ref 8–23)
CO2: 25 mmol/L (ref 22–32)
Calcium: 9.7 mg/dL (ref 8.9–10.3)
Chloride: 105 mmol/L (ref 98–111)
Creatinine, Ser: 1.07 mg/dL — ABNORMAL HIGH (ref 0.44–1.00)
GFR, Estimated: 50 mL/min — ABNORMAL LOW (ref >60)
Glucose, Bld: 86 mg/dL (ref 70–99)
Potassium: 3.6 mmol/L (ref 3.5–5.1)
Sodium: 139 mmol/L (ref 135–145)
Total Bilirubin: 0.5 mg/dL (ref 0.0–1.2)
Total Protein: 6.6 g/dL (ref 6.5–8.1)

## 2024-01-19 LAB — CBC WITH DIFFERENTIAL/PLATELET
Abs Immature Granulocytes: 0.02 10*3/uL (ref 0.00–0.07)
Basophils Absolute: 0.1 10*3/uL (ref 0.0–0.1)
Basophils Relative: 1 %
Eosinophils Absolute: 0.2 10*3/uL (ref 0.0–0.5)
Eosinophils Relative: 3 %
HCT: 31.7 % — ABNORMAL LOW (ref 36.0–46.0)
Hemoglobin: 10.3 g/dL — ABNORMAL LOW (ref 12.0–15.0)
Immature Granulocytes: 0 %
Lymphocytes Relative: 39 %
Lymphs Abs: 1.9 10*3/uL (ref 0.7–4.0)
MCH: 31.5 pg (ref 26.0–34.0)
MCHC: 32.5 g/dL (ref 30.0–36.0)
MCV: 96.9 fL (ref 80.0–100.0)
Monocytes Absolute: 0.3 10*3/uL (ref 0.1–1.0)
Monocytes Relative: 5 %
Neutro Abs: 2.6 10*3/uL (ref 1.7–7.7)
Neutrophils Relative %: 52 %
Platelets: 231 10*3/uL (ref 150–400)
RBC: 3.27 MIL/uL — ABNORMAL LOW (ref 3.87–5.11)
RDW: 12.7 % (ref 11.5–15.5)
WBC: 5 10*3/uL (ref 4.0–10.5)
nRBC: 0 % (ref 0.0–0.2)

## 2024-01-19 MED ORDER — DARATUMUMAB-HYALURONIDASE-FIHJ 1800-30000 MG-UT/15ML ~~LOC~~ SOLN
1800.0000 mg | Freq: Once | SUBCUTANEOUS | Status: AC
  Administered 2024-01-19: 1800 mg via SUBCUTANEOUS
  Filled 2024-01-19: qty 15

## 2024-01-19 MED ORDER — ACETAMINOPHEN 325 MG PO TABS
650.0000 mg | ORAL_TABLET | Freq: Once | ORAL | Status: AC
  Administered 2024-01-19: 650 mg via ORAL

## 2024-01-19 MED ORDER — ZOLEDRONIC ACID 4 MG/5ML IV CONC
3.0000 mg | Freq: Once | INTRAVENOUS | Status: AC
  Administered 2024-01-19: 3 mg via INTRAVENOUS
  Filled 2024-01-19: qty 3.75

## 2024-01-19 MED ORDER — DIPHENHYDRAMINE HCL 25 MG PO CAPS
50.0000 mg | ORAL_CAPSULE | Freq: Once | ORAL | Status: AC
  Administered 2024-01-19: 50 mg via ORAL

## 2024-01-19 NOTE — Progress Notes (Signed)
 Hematology/Oncology Progress note [phone] [phone]     CHIEF COMPLAINTS/REASON FOR VISIT:  Follow-up for multiple myeloma  ASSESSMENT & PLAN:   Cancer Staging  Multiple myeloma (HCC) Staging form: Plasma Cell Myeloma and Plasma Cell Disorders, AJCC 8th Edition - Clinical stage from 08/22/2021: RISS Stage II (Beta-2 -microglobulin (mg/L): 4.1, Albumin (g/dL): 2.8, ISS: Stage II, High-risk cytogenetics: Absent, LDH: Normal) - Signed by Babara Call, MD on 09/06/2021   Multiple myeloma (HCC) Multiple myeloma, IgA kappa, complex hyperploid gain of 1q, S/p Daratumumab  Rd.   Revlimid  D1-21,  07/23/22 bone marrow biopsy  3% plasma cell, no M protein.   Labs are reviewed and discussed with patient.  Lab Results  Component Value Date   MPROTEIN Not Observed 12/22/2023   KPAFRELGTCHN 8.3 12/22/2023   LAMBDASER 4.1 (L) 12/22/2023   KAPLAMBRATIO 2.02 (H) 12/22/2023     M protein is stable. Stable light chain ratio.  Proceed with  Daratumumab  monotherapy maintenance - she takes home supply of Dexamethasone  prior to her Daratumumab .  On asprin 81mg  daily and Acyclovir  400mg  BID.   Stage 3a chronic kidney disease (HCC) recommend patient to avoid nephrotoxin.  Encourage oral hydration.  Normocytic anemia Stable Continue monitor.   Encounter for antineoplastic chemotherapy Chemotherapy plan as listed as above.  Orders Placed This Encounter  Procedures   Kappa/lambda light chains    Standing Status:   Future    Expected Date:   02/16/2024    Expiration Date:   02/15/2025   Multiple Myeloma Panel (SPEP&IFE w/QIG)    Standing Status:   Future    Expected Date:   02/16/2024    Expiration Date:   02/15/2025   Comprehensive metabolic panel    Standing Status:   Future    Expected Date:   02/16/2024    Expiration Date:   02/15/2025   CBC with Differential    Standing Status:   Future    Expected Date:   02/16/2024    Expiration Date:   02/15/2025   Kappa/lambda light  chains    Standing Status:   Future    Expected Date:   03/15/2024    Expiration Date:   03/15/2025   Multiple Myeloma Panel (SPEP&IFE w/QIG)    Standing Status:   Future    Expected Date:   03/15/2024    Expiration Date:   03/15/2025   Comprehensive metabolic panel    Standing Status:   Future    Expected Date:   03/15/2024    Expiration Date:   03/15/2025   CBC with Differential    Standing Status:   Future    Expected Date:   03/15/2024    Expiration Date:   03/15/2025   Kappa/lambda light chains    Standing Status:   Future    Expected Date:   04/12/2024    Expiration Date:   04/12/2025   Multiple Myeloma Panel (SPEP&IFE w/QIG)    Standing Status:   Future    Expected Date:   04/12/2024    Expiration Date:   04/12/2025   Comprehensive metabolic panel    Standing Status:   Future    Expected Date:   04/12/2024    Expiration Date:   04/12/2025   CBC with Differential    Standing Status:   Future    Expected Date:   04/12/2024    Expiration Date:   04/12/2025     Follow up  Lab/MD/Dara in 4 weeks  All questions were answered. The patient  knows to call the clinic with any problems, questions or concerns.  Zelphia Cap, MD, PhD [facility] Hematology Oncology 01/19/2024     HISTORY OF PRESENTING ILLNESS:   Stephanie Delacruz is a  89 y.o.  female presents for follow up of multiple myeloma.  Oncology History  Multiple myeloma (HCC)  08/22/2021 Initial Diagnosis   Multiple myeloma (HCC) IgA kappa Initial M protein 5.2, free kappa light chain ratio 89.2, Kappa/lambda ratio 13.31   08/22/2021 Cancer Staging   Staging form: Plasma Cell Myeloma and Plasma Cell Disorders, AJCC 8th Edition - Clinical stage from 08/22/2021: RISS Stage II (Beta-2 -microglobulin (mg/L): 4.1, Albumin (g/dL): 2.8, ISS: Stage II, High-risk cytogenetics: Absent, LDH: Normal) - Signed by Cap Zelphia, MD on 09/06/2021 Stage prefix: Initial diagnosis Beta 2 microglobulin range (mg/L): 3.5 to 5.49 Albumin range (g/dL): Less  than 3.5 Cytogenetics: 1q addition   08/26/2021 - 09/01/2022 Chemotherapy   08/26/21 MYELOMA Daratumumab  SQ q28d  09/06/2021 added Revlimid  10mg  x 14 days.  09/27/2021 starting cycle 2 Revlimid  10 mg 21 days on, 7 days off. Weekly Dexamethasone .    08/26/2021 Bone Marrow Biopsy   Bone marrow biopsy showed hypercellular marrow with plasma cell neoplasm. 67% plasma cells in the aspirate. Cytogenetics showed complex hyperplasia, gain of 1 q.    07/23/2022 Bone Marrow Biopsy   Bone marrow biopsy showed Hypercellular bone marrow for age with trilineage hematopoiesis and 3% plasma cells  Cytogenetics are normal.   04/20/2023 Imaging   PET scan showed  1. Patchy hypermetabolism throughout the spine with focal hypermetabolism in the right third rib and left sacrum, compatible with residual metabolically active multiple myeloma. 2. Hypermetabolic 5 mm right cervical lymph node, indeterminate.Recommend attention on follow-up. 3. 3.6 cm thyroid  nodule, previously evaluated by ultrasound 08/27/2022 and biopsy 10/07/2022. Please refer to pathology report for further evaluation. No follow-up recommended unless clinically warranted. (Ref: J Am Coll Radiol. 2015 Feb;12(2): 143-50). 4. Aortic atherosclerosis (ICD10-I70.0). Coronary artery calcification.    S/p thyroid  lesion biopsy, obtained by endocrinologist. + AUS  INTERVAL HISTORY Faylene L Madruga is a 89 y.o. female who has above history reviewed by me today presents for follow up visit for management of multiple myeloma She was accompanied by her daughter today.  She denies any nausea vomiting diarrhea.   No new complaints.   Review of Systems  Constitutional:  Positive for fatigue. Negative for appetite change, chills and fever.  HENT:   Negative for hearing loss and voice change.   Eyes:  Negative for eye problems.  Respiratory:  Negative for chest tightness and cough.   Cardiovascular:  Negative for chest pain.  Gastrointestinal:  Negative for  abdominal distention, abdominal pain and blood in stool.  Endocrine: Negative for hot flashes.  Genitourinary:  Negative for difficulty urinating and frequency.   Musculoskeletal:  Positive for back pain. Negative for arthralgias.  Skin:  Negative for itching and rash.  Neurological:  Negative for extremity weakness.  Hematological:  Negative for adenopathy.  Psychiatric/Behavioral:  Negative for confusion.     MEDICAL HISTORY:  Past Medical History:  Diagnosis Date   Anemia    GERD (gastroesophageal reflux disease)    Hypercholesterolemia    Hypertension    Renal insufficiency     SURGICAL HISTORY: Past Surgical History:  Procedure Laterality Date   TONSILLECTOMY     TUBAL LIGATION      SOCIAL HISTORY: Social History   Socioeconomic History   Marital status: Married    Spouse name: Not  on file   Number of children: Not on file   Years of education: Not on file   Highest education level: Not on file  Occupational History   Not on file  Tobacco Use   Smoking status: Never   Smokeless tobacco: Never  Vaping Use   Vaping status: Never Used  Substance and Sexual Activity   Alcohol use: No   Drug use: No   Sexual activity: Not on file  Other Topics Concern   Not on file  Social History Narrative   Lives with husband Lynwood and daughter Jon. No pets.    Social Drivers of Corporate investment banker Strain: Not on file  Food Insecurity: No Food Insecurity (01/11/2024)   Hunger Vital Sign    Worried About Running Out of Food in the Last Year: Never true    Ran Out of Food in the Last Year: Never true  Transportation Needs: No Transportation Needs (01/11/2024)   PRAPARE - Administrator, Civil Service (Medical): No    Lack of Transportation (Non-Medical): No  Physical Activity: Not on file  Stress: Not on file  Social Connections: Not on file  Intimate Partner Violence: Not At Risk (01/11/2024)   Humiliation, Afraid, Rape, and Kick questionnaire     Fear of Current or Ex-Partner: No    Emotionally Abused: No    Physically Abused: No    Sexually Abused: No    FAMILY HISTORY: Family History  Problem Relation Age of Onset   Hypertension Mother    Arthritis Mother    Hypertension Father    Stroke Father    Breast cancer Niece     ALLERGIES:  has no known allergies.  MEDICATIONS:  Current Outpatient Medications  Medication Sig Dispense Refill   acyclovir  (ZOVIRAX ) 400 MG tablet Take 1 tablet (400 mg total) by mouth 2 (two) times daily. 60 tablet 11   amLODipine  (NORVASC ) 2.5 MG tablet Take 1 tablet (2.5 mg total) by mouth daily.     ascorbic acid (VITAMIN C) 250 MG tablet daily Take 2 tablets every day by oral route.     aspirin 81 MG EC tablet Take 81 mg by mouth daily.     Calcium Carb-Cholecalciferol (CALCIUM 600+D) 600-20 MG-MCG TABS Take 1 tablet by mouth 3 (three) times daily.     dexamethasone  (DECADRON ) 4 MG tablet Take 5 tablets (20 mg total) by mouth See admin instructions. Take 20mg  prior to each Daratumumab  treatment. 30 tablet 1   donepezil  (ARICEPT ) 5 MG tablet Take 10 mg by mouth daily.     fenofibrate  (TRICOR ) 48 MG tablet TAKE 1 TABLET(48 MG) BY MOUTH DAILY 90 tablet 2   fexofenadine (ALLEGRA) 180 MG tablet Take by mouth.     hydrochlorothiazide  (HYDRODIURIL ) 25 MG tablet TAKE 1/2 TABLET BY MOUTH EVERY DAY 45 tablet 3   memantine (NAMENDA) 5 MG tablet Take 5 mg by mouth 2 (two) times daily.     methimazole  (TAPAZOLE ) 5 MG tablet Take 1 tablet (5 mg total) by mouth daily. 90 tablet 3   metoprolol  succinate (TOPROL -XL) 25 MG 24 hr tablet Take 1 tablet (25 mg total) by mouth daily. 90 tablet 3   montelukast  (SINGULAIR ) 10 MG tablet Take 1 tablet (10 mg total) by mouth See admin instructions. Take 1 tablet one day before Daratumumab  injection. 60 tablet 0   Multiple Vitamins-Minerals (WOMENS MULTIVITAMIN PO) Take 1 tablet by mouth daily.     omeprazole  (PRILOSEC) 20 MG capsule TAKE  1 CAPSULE(20 MG) BY MOUTH DAILY 90  capsule 3   prochlorperazine  (COMPAZINE ) 10 MG tablet Take 10 mg by mouth every 6 (six) hours as needed for nausea or vomiting.     valsartan  (DIOVAN ) 160 MG tablet TAKE 1 TABLET(160 MG) BY MOUTH DAILY (Patient not taking: Reported on 01/19/2024) 90 tablet 1   No current facility-administered medications for this visit.     PHYSICAL EXAMINATION: ECOG PERFORMANCE STATUS: 1 - Symptomatic but completely ambulatory Vitals:   01/19/24 0920 01/19/24 0927  BP: (!) 169/76 (!) 150/76  Pulse: 71   Resp: 18   Temp: (!) 97.5 F (36.4 C)   SpO2: 100%    Filed Weights   01/19/24 0920  Weight: 153 lb (69.4 kg)    Physical Exam Constitutional:      General: She is not in acute distress. HENT:     Head: Normocephalic and atraumatic.   Eyes:     General: No scleral icterus.   Cardiovascular:     Rate and Rhythm: Normal rate and regular rhythm.     Heart sounds: Normal heart sounds.  Pulmonary:     Effort: Pulmonary effort is normal. No respiratory distress.     Breath sounds: No wheezing.  Abdominal:     General: Bowel sounds are normal. There is no distension.     Palpations: Abdomen is soft.   Musculoskeletal:        General: No deformity. Normal range of motion.     Cervical back: Normal range of motion and neck supple.   Skin:    General: Skin is warm and dry.     Findings: No erythema or rash.   Neurological:     Mental Status: She is alert and oriented to person, place, and time. Mental status is at baseline.     Cranial Nerves: No cranial nerve deficit.     Coordination: Coordination normal.   Psychiatric:        Mood and Affect: Mood normal.     LABORATORY DATA:  I have reviewed the data as listed    Latest Ref Rng & Units 01/19/2024    8:34 AM 12/22/2023    8:44 AM 11/24/2023    8:33 AM  CBC  WBC 4.0 - 10.5 K/uL 5.0  6.4  5.5   Hemoglobin 12.0 - 15.0 g/dL 89.6  89.8  89.6   Hematocrit 36.0 - 46.0 % 31.7  31.7  32.3   Platelets 150 - 400 K/uL 231  235  230        Latest Ref Rng & Units 01/19/2024    8:34 AM 12/22/2023    8:44 AM 11/30/2023    1:37 PM  CMP  Glucose 70 - 99 mg/dL 86  93  93   BUN 8 - 23 mg/dL 19  20  17    Creatinine 0.44 - 1.00 mg/dL 8.92  8.88  9.02   Sodium 135 - 145 mmol/L 139  142  143   Potassium 3.5 - 5.1 mmol/L 3.6  3.8  4.0   Chloride 98 - 111 mmol/L 105  110  107   CO2 22 - 32 mmol/L 25  23  25    Calcium 8.9 - 10.3 mg/dL 9.7  8.8  9.7   Total Protein 6.5 - 8.1 g/dL 6.6  6.3    Total Bilirubin 0.0 - 1.2 mg/dL 0.5  0.4    Alkaline Phos 38 - 126 U/L 46  48    AST 15 - 41  U/L 25  26    ALT 0 - 44 U/L 16  15       Iron/TIBC/Ferritin/ %Sat    Component Value Date/Time   IRON 54 07/24/2021 1212   TIBC 277 07/24/2021 1212   FERRITIN 93 07/24/2021 1212   IRONPCTSAT 20 07/24/2021 1212       RADIOGRAPHIC STUDIES: I have personally reviewed the radiological images as listed and agreed with the findings in the report. No results found.

## 2024-01-19 NOTE — Patient Instructions (Signed)
 [facility] - A DEPT OF [facility]  Discharge Instructions: Thank you for choosing [facility] to provide your oncology and hematology care.  If you have a lab appointment with the Cancer Center, please go directly to the Cancer Center and check in at the registration area.  Wear comfortable clothing and clothing appropriate for easy access to any Portacath or PICC line.   We strive to give you quality time with your provider. You may need to reschedule your appointment if you arrive late (15 or more minutes).  Arriving late affects you and other patients whose appointments are after yours.  Also, if you miss three or more appointments without notifying the office, you may be dismissed from the clinic at the provider's discretion.      For prescription refill requests, have your pharmacy contact our office and allow 72 hours for refills to be completed.    Today you received the following chemotherapy and/or immunotherapy agents DARZALEX  and ZOMETA       To help prevent nausea and vomiting after your treatment, we encourage you to take your nausea medication as directed.  BELOW ARE SYMPTOMS THAT SHOULD BE REPORTED IMMEDIATELY: *FEVER GREATER THAN 100.4 F (38 C) OR HIGHER *CHILLS OR SWEATING *NAUSEA AND VOMITING THAT IS NOT CONTROLLED WITH YOUR NAUSEA MEDICATION *UNUSUAL SHORTNESS OF BREATH *UNUSUAL BRUISING OR BLEEDING *URINARY PROBLEMS (pain or burning when urinating, or frequent urination) *BOWEL PROBLEMS (unusual diarrhea, constipation, pain near the anus) TENDERNESS IN MOUTH AND THROAT WITH OR WITHOUT PRESENCE OF ULCERS (sore throat, sores in mouth, or a toothache) UNUSUAL RASH, SWELLING OR PAIN  UNUSUAL VAGINAL DISCHARGE OR ITCHING   Items with * indicate a potential emergency and should be followed up as soon as possible or go to the Emergency Department if any problems should occur.  Please show the CHEMOTHERAPY ALERT CARD or  IMMUNOTHERAPY ALERT CARD at check-in to the Emergency Department and triage nurse.  Should you have questions after your visit or need to cancel or reschedule your appointment, please contact [facility] - A DEPT OF [facility]  [phone] and follow the prompts.  Office hours are 8:00 a.m. to 4:30 p.m. Monday - Friday. Please note that voicemails left after 4:00 p.m. may not be returned until the following business day.  We are closed weekends and major holidays. You have access to a nurse at all times for urgent questions. Please call the main number to the clinic [phone] and follow the prompts.  For any non-urgent questions, you may also contact your provider using MyChart. We now offer e-Visits for anyone 97 and older to request care online for non-urgent symptoms. For details visit [email or web address].   Also download the MyChart app! Go to the app store, search MyChart, open the app, select [place], and log in with your MyChart username and password.  Daratumumab ; Hyaluronidase  Injection What is this medication? DARATUMUMAB ; HYALURONIDASE  (dar a toom ue mab; hye al ur ON i dase) treats multiple myeloma, a type of bone marrow cancer. Daratumumab  works by blocking a protein that causes cancer cells to grow and multiply. This helps to slow or stop the spread of cancer cells. Hyaluronidase  works by increasing the absorption of other medications in the body to help them work better. This medication may also be used treat amyloidosis, a condition that causes the buildup of a protein (amyloid) in your body. It works by reducing  the buildup of this protein, which decreases symptoms. It is a combination medication that contains a monoclonal antibody. This medicine may be used for other purposes; ask your health care provider or pharmacist if you have questions. COMMON BRAND NAME(S): DARZALEX  FASPRO What should I tell my care team before I take this  medication? They need to know if you have any of these conditions: Heart disease Infection, such as chickenpox, cold sores, herpes, hepatitis B Lung or breathing disease An unusual or allergic reaction to daratumumab , hyaluronidase , other medications, foods, dyes, or preservatives Pregnant or trying to get pregnant Breast-feeding How should I use this medication? This medication is injected under the skin. It is given by your care team in a hospital or clinic setting. Talk to your care team about the use of this medication in children. Special care may be needed. Overdosage: If you think you have taken too much of this medicine contact a poison control center or emergency room at once. NOTE: This medicine is only for you. Do not share this medicine with others. What if I miss a dose? Keep appointments for follow-up doses. It is important not to miss your dose. Call your care team if you are unable to keep an appointment. What may interact with this medication? Interactions have not been studied. This list may not describe all possible interactions. Give your health care provider a list of all the medicines, herbs, non-prescription drugs, or dietary supplements you use. Also tell them if you smoke, drink alcohol, or use illegal drugs. Some items may interact with your medicine. What should I watch for while using this medication? Your condition will be monitored carefully while you are receiving this medication. This medication can cause serious allergic reactions. To reduce your risk, your care team may give you other medication to take before receiving this one. Be sure to follow the directions from your care team. This medication can affect the results of blood tests to match your blood type. These changes can last for up to 6 months after the final dose. Your care team will do blood tests to match your blood type before you start treatment. Tell all of your care team that you are being  treated with this medication before receiving a blood transfusion. This medication can affect the results of some tests used to determine treatment response; extra tests may be needed to evaluate response. Talk to your care team if you wish to become pregnant or think you are pregnant. This medication can cause serious birth defects if taken during pregnancy and for 3 months after the last dose. A reliable form of contraception is recommended while taking this medication and for 3 months after the last dose. Talk to your care team about effective forms of contraception. Do not breast-feed while taking this medication. What side effects may I notice from receiving this medication? Side effects that you should report to your care team as soon as possible: Allergic reactions--skin rash, itching, hives, swelling of the face, lips, tongue, or throat Heart rhythm changes--fast or irregular heartbeat, dizziness, feeling faint or lightheaded, chest pain, trouble breathing Infection--fever, chills, cough, sore throat, wounds that don't heal, pain or trouble when passing urine, general feeling of discomfort or being unwell Infusion reactions--chest pain, shortness of breath or trouble breathing, feeling faint or lightheaded Sudden eye pain or change in vision such as blurry vision, seeing halos around lights, vision loss Unusual bruising or bleeding Side effects that usually do not require medical attention (report  to your care team if they continue or are bothersome): Constipation Diarrhea Fatigue Nausea Pain, tingling, or numbness in the hands or feet Swelling of the ankles, hands, or feet This list may not describe all possible side effects. Call your doctor for medical advice about side effects. You may report side effects to FDA at 1-800-FDA-1088. Where should I keep my medication? This medication is given in a hospital or clinic. It will not be stored at home. NOTE: This sheet is a summary. It may  not cover all possible information. If you have questions about this medicine, talk to your doctor, pharmacist, or health care provider.  2024 Elsevier/Gold Standard (2021-11-19 00:00:00)  Zoledronic  Acid Injection (Cancer) What is this medication? ZOLEDRONIC  ACID (ZOE le dron ik AS id) treats high calcium levels in the blood caused by cancer. It may also be used with chemotherapy to treat weakened bones caused by cancer. It works by slowing down the release of calcium from bones. This lowers calcium levels in your blood. It also makes your bones stronger and less likely to break (fracture). It belongs to a group of medications called bisphosphonates. This medicine may be used for other purposes; ask your health care provider or pharmacist if you have questions. COMMON BRAND NAME(S): Zometa , Zometa  Powder What should I tell my care team before I take this medication? They need to know if you have any of these conditions: Dehydration Dental disease Kidney disease Liver disease Low levels of calcium in the blood Lung or breathing disease, such as asthma Receiving steroids, such as dexamethasone  or prednisone An unusual or allergic reaction to zoledronic  acid, other medications, foods, dyes, or preservatives Pregnant or trying to get pregnant Breast-feeding How should I use this medication? This medication is injected into a vein. It is given by your care team in a hospital or clinic setting. Talk to your care team about the use of this medication in children. Special care may be needed. Overdosage: If you think you have taken too much of this medicine contact a poison control center or emergency room at once. NOTE: This medicine is only for you. Do not share this medicine with others. What if I miss a dose? Keep appointments for follow-up doses. It is important not to miss your dose. Call your care team if you are unable to keep an appointment. What may interact with this  medication? Certain antibiotics given by injection Diuretics, such as bumetanide, furosemide NSAIDs, medications for pain and inflammation, such as ibuprofen or naproxen Teriparatide Thalidomide This list may not describe all possible interactions. Give your health care provider a list of all the medicines, herbs, non-prescription drugs, or dietary supplements you use. Also tell them if you smoke, drink alcohol, or use illegal drugs. Some items may interact with your medicine. What should I watch for while using this medication? Visit your care team for regular checks on your progress. It may be some time before you see the benefit from this medication. Some people who take this medication have severe bone, joint, or muscle pain. This medication may also increase your risk for jaw problems or a broken thigh bone. Tell your care team right away if you have severe pain in your jaw, bones, joints, or muscles. Tell you care team if you have any pain that does not go away or that gets worse. Tell your dentist and dental surgeon that you are taking this medication. You should not have major dental surgery while on this medication. See your  dentist to have a dental exam and fix any dental problems before starting this medication. Take good care of your teeth while on this medication. Make sure you see your dentist for regular follow-up appointments. You should make sure you get enough calcium and vitamin D while you are taking this medication. Discuss the foods you eat and the vitamins you take with your care team. Check with your care team if you have severe diarrhea, nausea, and vomiting, or if you sweat a lot. The loss of too much body fluid may make it dangerous for you to take this medication. You may need bloodwork while taking this medication. Talk to your care team if you wish to become pregnant or think you might be pregnant. This medication can cause serious birth defects. What side effects may I  notice from receiving this medication? Side effects that you should report to your care team as soon as possible: Allergic reactions--skin rash, itching, hives, swelling of the face, lips, tongue, or throat Kidney injury--decrease in the amount of urine, swelling of the ankles, hands, or feet Low calcium level--muscle pain or cramps, confusion, tingling, or numbness in the hands or feet Osteonecrosis of the jaw--pain, swelling, or redness in the mouth, numbness of the jaw, poor healing after dental work, unusual discharge from the mouth, visible bones in the mouth Severe bone, joint, or muscle pain Side effects that usually do not require medical attention (report to your care team if they continue or are bothersome): Constipation Fatigue Fever Loss of appetite Nausea Stomach pain This list may not describe all possible side effects. Call your doctor for medical advice about side effects. You may report side effects to FDA at 1-800-FDA-1088. Where should I keep my medication? This medication is given in a hospital or clinic. It will not be stored at home. NOTE: This sheet is a summary. It may not cover all possible information. If you have questions about this medicine, talk to your doctor, pharmacist, or health care provider.  2024 Elsevier/Gold Standard (2021-09-06 00:00:00)

## 2024-01-19 NOTE — Assessment & Plan Note (Signed)
recommend patient to avoid nephrotoxin.  Encourage oral hydration. 

## 2024-01-19 NOTE — Assessment & Plan Note (Signed)
 Stable. Continue monitor

## 2024-01-19 NOTE — Assessment & Plan Note (Signed)
Chemotherapy plan as listed as above. 

## 2024-01-19 NOTE — Assessment & Plan Note (Signed)
 Multiple myeloma, IgA kappa, complex hyperploid gain of 1q, S/p Daratumumab  Rd.   Revlimid  D1-21,  07/23/22 bone marrow biopsy  3% plasma cell, no M protein.   Labs are reviewed and discussed with patient.  Lab Results  Component Value Date   MPROTEIN Not Observed 12/22/2023   KPAFRELGTCHN 8.3 12/22/2023   LAMBDASER 4.1 (L) 12/22/2023   KAPLAMBRATIO 2.02 (H) 12/22/2023     M protein is stable. Stable light chain ratio.  Proceed with  Daratumumab  monotherapy maintenance - she takes home supply of Dexamethasone  prior to her Daratumumab .  On asprin 81mg  daily and Acyclovir  400mg  BID.

## 2024-01-20 LAB — KAPPA/LAMBDA LIGHT CHAINS
Kappa free light chain: 10.1 mg/L (ref 3.3–19.4)
Kappa, lambda light chain ratio: 1.84 — ABNORMAL HIGH (ref 0.26–1.65)
Lambda free light chains: 5.5 mg/L — ABNORMAL LOW (ref 5.7–26.3)

## 2024-01-21 DIAGNOSIS — N1831 Chronic kidney disease, stage 3a: Secondary | ICD-10-CM | POA: Diagnosis not present

## 2024-01-21 DIAGNOSIS — E7800 Pure hypercholesterolemia, unspecified: Secondary | ICD-10-CM | POA: Diagnosis not present

## 2024-01-21 DIAGNOSIS — Z556 Problems related to health literacy: Secondary | ICD-10-CM | POA: Diagnosis not present

## 2024-01-21 DIAGNOSIS — C9001 Multiple myeloma in remission: Secondary | ICD-10-CM | POA: Diagnosis not present

## 2024-01-21 DIAGNOSIS — D630 Anemia in neoplastic disease: Secondary | ICD-10-CM | POA: Diagnosis not present

## 2024-01-21 DIAGNOSIS — I129 Hypertensive chronic kidney disease with stage 1 through stage 4 chronic kidney disease, or unspecified chronic kidney disease: Secondary | ICD-10-CM | POA: Diagnosis not present

## 2024-01-21 DIAGNOSIS — D631 Anemia in chronic kidney disease: Secondary | ICD-10-CM | POA: Diagnosis not present

## 2024-01-21 DIAGNOSIS — Z9089 Acquired absence of other organs: Secondary | ICD-10-CM | POA: Diagnosis not present

## 2024-01-21 DIAGNOSIS — F0390 Unspecified dementia without behavioral disturbance: Secondary | ICD-10-CM | POA: Diagnosis not present

## 2024-01-21 DIAGNOSIS — K219 Gastro-esophageal reflux disease without esophagitis: Secondary | ICD-10-CM | POA: Diagnosis not present

## 2024-01-21 DIAGNOSIS — E042 Nontoxic multinodular goiter: Secondary | ICD-10-CM | POA: Diagnosis not present

## 2024-01-21 DIAGNOSIS — Z7982 Long term (current) use of aspirin: Secondary | ICD-10-CM | POA: Diagnosis not present

## 2024-01-21 LAB — MULTIPLE MYELOMA PANEL, SERUM
Albumin SerPl Elph-Mcnc: 3.5 g/dL (ref 2.9–4.4)
Albumin/Glob SerPl: 1.3 (ref 0.7–1.7)
Alpha 1: 0.2 g/dL (ref 0.0–0.4)
Alpha2 Glob SerPl Elph-Mcnc: 0.8 g/dL (ref 0.4–1.0)
B-Globulin SerPl Elph-Mcnc: 1.3 g/dL (ref 0.7–1.3)
Gamma Glob SerPl Elph-Mcnc: 0.4 g/dL (ref 0.4–1.8)
Globulin, Total: 2.7 g/dL (ref 2.2–3.9)
IgA: 166 mg/dL (ref 64–422)
IgG (Immunoglobin G), Serum: 539 mg/dL — ABNORMAL LOW (ref 586–1602)
IgM (Immunoglobulin M), Srm: 22 mg/dL — ABNORMAL LOW (ref 26–217)
Total Protein ELP: 6.2 g/dL (ref 6.0–8.5)

## 2024-01-27 DIAGNOSIS — N1831 Chronic kidney disease, stage 3a: Secondary | ICD-10-CM | POA: Diagnosis not present

## 2024-01-27 DIAGNOSIS — C9001 Multiple myeloma in remission: Secondary | ICD-10-CM | POA: Diagnosis not present

## 2024-01-27 DIAGNOSIS — E7800 Pure hypercholesterolemia, unspecified: Secondary | ICD-10-CM | POA: Diagnosis not present

## 2024-01-27 DIAGNOSIS — Z7982 Long term (current) use of aspirin: Secondary | ICD-10-CM | POA: Diagnosis not present

## 2024-01-27 DIAGNOSIS — I129 Hypertensive chronic kidney disease with stage 1 through stage 4 chronic kidney disease, or unspecified chronic kidney disease: Secondary | ICD-10-CM | POA: Diagnosis not present

## 2024-01-27 DIAGNOSIS — D631 Anemia in chronic kidney disease: Secondary | ICD-10-CM | POA: Diagnosis not present

## 2024-01-27 DIAGNOSIS — Z556 Problems related to health literacy: Secondary | ICD-10-CM | POA: Diagnosis not present

## 2024-01-27 DIAGNOSIS — D630 Anemia in neoplastic disease: Secondary | ICD-10-CM | POA: Diagnosis not present

## 2024-01-27 DIAGNOSIS — Z9089 Acquired absence of other organs: Secondary | ICD-10-CM | POA: Diagnosis not present

## 2024-01-27 DIAGNOSIS — K219 Gastro-esophageal reflux disease without esophagitis: Secondary | ICD-10-CM | POA: Diagnosis not present

## 2024-01-27 DIAGNOSIS — E042 Nontoxic multinodular goiter: Secondary | ICD-10-CM | POA: Diagnosis not present

## 2024-01-27 DIAGNOSIS — F0390 Unspecified dementia without behavioral disturbance: Secondary | ICD-10-CM | POA: Diagnosis not present

## 2024-01-31 DIAGNOSIS — E042 Nontoxic multinodular goiter: Secondary | ICD-10-CM | POA: Diagnosis not present

## 2024-01-31 DIAGNOSIS — Z556 Problems related to health literacy: Secondary | ICD-10-CM | POA: Diagnosis not present

## 2024-01-31 DIAGNOSIS — I129 Hypertensive chronic kidney disease with stage 1 through stage 4 chronic kidney disease, or unspecified chronic kidney disease: Secondary | ICD-10-CM | POA: Diagnosis not present

## 2024-01-31 DIAGNOSIS — Z7982 Long term (current) use of aspirin: Secondary | ICD-10-CM | POA: Diagnosis not present

## 2024-01-31 DIAGNOSIS — D630 Anemia in neoplastic disease: Secondary | ICD-10-CM | POA: Diagnosis not present

## 2024-01-31 DIAGNOSIS — D631 Anemia in chronic kidney disease: Secondary | ICD-10-CM | POA: Diagnosis not present

## 2024-01-31 DIAGNOSIS — Z9089 Acquired absence of other organs: Secondary | ICD-10-CM | POA: Diagnosis not present

## 2024-01-31 DIAGNOSIS — E7800 Pure hypercholesterolemia, unspecified: Secondary | ICD-10-CM | POA: Diagnosis not present

## 2024-01-31 DIAGNOSIS — F0390 Unspecified dementia without behavioral disturbance: Secondary | ICD-10-CM | POA: Diagnosis not present

## 2024-01-31 DIAGNOSIS — K219 Gastro-esophageal reflux disease without esophagitis: Secondary | ICD-10-CM | POA: Diagnosis not present

## 2024-01-31 DIAGNOSIS — N1831 Chronic kidney disease, stage 3a: Secondary | ICD-10-CM | POA: Diagnosis not present

## 2024-01-31 DIAGNOSIS — C9001 Multiple myeloma in remission: Secondary | ICD-10-CM | POA: Diagnosis not present

## 2024-02-02 DIAGNOSIS — Z7982 Long term (current) use of aspirin: Secondary | ICD-10-CM | POA: Diagnosis not present

## 2024-02-02 DIAGNOSIS — D631 Anemia in chronic kidney disease: Secondary | ICD-10-CM | POA: Diagnosis not present

## 2024-02-02 DIAGNOSIS — D630 Anemia in neoplastic disease: Secondary | ICD-10-CM | POA: Diagnosis not present

## 2024-02-02 DIAGNOSIS — Z556 Problems related to health literacy: Secondary | ICD-10-CM | POA: Diagnosis not present

## 2024-02-02 DIAGNOSIS — E042 Nontoxic multinodular goiter: Secondary | ICD-10-CM | POA: Diagnosis not present

## 2024-02-02 DIAGNOSIS — K219 Gastro-esophageal reflux disease without esophagitis: Secondary | ICD-10-CM | POA: Diagnosis not present

## 2024-02-02 DIAGNOSIS — N1831 Chronic kidney disease, stage 3a: Secondary | ICD-10-CM | POA: Diagnosis not present

## 2024-02-02 DIAGNOSIS — C9001 Multiple myeloma in remission: Secondary | ICD-10-CM | POA: Diagnosis not present

## 2024-02-02 DIAGNOSIS — E7800 Pure hypercholesterolemia, unspecified: Secondary | ICD-10-CM | POA: Diagnosis not present

## 2024-02-02 DIAGNOSIS — I129 Hypertensive chronic kidney disease with stage 1 through stage 4 chronic kidney disease, or unspecified chronic kidney disease: Secondary | ICD-10-CM | POA: Diagnosis not present

## 2024-02-02 DIAGNOSIS — Z9089 Acquired absence of other organs: Secondary | ICD-10-CM | POA: Diagnosis not present

## 2024-02-02 DIAGNOSIS — F0390 Unspecified dementia without behavioral disturbance: Secondary | ICD-10-CM | POA: Diagnosis not present

## 2024-02-11 DIAGNOSIS — F0390 Unspecified dementia without behavioral disturbance: Secondary | ICD-10-CM | POA: Diagnosis not present

## 2024-02-11 DIAGNOSIS — K219 Gastro-esophageal reflux disease without esophagitis: Secondary | ICD-10-CM | POA: Diagnosis not present

## 2024-02-11 DIAGNOSIS — D631 Anemia in chronic kidney disease: Secondary | ICD-10-CM | POA: Diagnosis not present

## 2024-02-11 DIAGNOSIS — E7800 Pure hypercholesterolemia, unspecified: Secondary | ICD-10-CM | POA: Diagnosis not present

## 2024-02-11 DIAGNOSIS — Z9089 Acquired absence of other organs: Secondary | ICD-10-CM | POA: Diagnosis not present

## 2024-02-11 DIAGNOSIS — C9001 Multiple myeloma in remission: Secondary | ICD-10-CM | POA: Diagnosis not present

## 2024-02-11 DIAGNOSIS — E042 Nontoxic multinodular goiter: Secondary | ICD-10-CM | POA: Diagnosis not present

## 2024-02-11 DIAGNOSIS — I129 Hypertensive chronic kidney disease with stage 1 through stage 4 chronic kidney disease, or unspecified chronic kidney disease: Secondary | ICD-10-CM | POA: Diagnosis not present

## 2024-02-11 DIAGNOSIS — Z7982 Long term (current) use of aspirin: Secondary | ICD-10-CM | POA: Diagnosis not present

## 2024-02-11 DIAGNOSIS — Z556 Problems related to health literacy: Secondary | ICD-10-CM | POA: Diagnosis not present

## 2024-02-11 DIAGNOSIS — D630 Anemia in neoplastic disease: Secondary | ICD-10-CM | POA: Diagnosis not present

## 2024-02-11 DIAGNOSIS — N1831 Chronic kidney disease, stage 3a: Secondary | ICD-10-CM | POA: Diagnosis not present

## 2024-02-13 ENCOUNTER — Other Ambulatory Visit: Payer: Self-pay | Admitting: Internal Medicine

## 2024-02-16 ENCOUNTER — Inpatient Hospital Stay: Attending: Oncology

## 2024-02-16 ENCOUNTER — Inpatient Hospital Stay

## 2024-02-16 ENCOUNTER — Encounter: Payer: Self-pay | Admitting: Oncology

## 2024-02-16 ENCOUNTER — Inpatient Hospital Stay (HOSPITAL_BASED_OUTPATIENT_CLINIC_OR_DEPARTMENT_OTHER): Admitting: Oncology

## 2024-02-16 VITALS — BP 129/54 | HR 65 | Resp 18

## 2024-02-16 VITALS — BP 140/80 | HR 64 | Temp 97.8°F | Resp 18 | Ht 62.0 in | Wt 153.0 lb

## 2024-02-16 DIAGNOSIS — I700 Atherosclerosis of aorta: Secondary | ICD-10-CM | POA: Diagnosis not present

## 2024-02-16 DIAGNOSIS — N1831 Chronic kidney disease, stage 3a: Secondary | ICD-10-CM | POA: Diagnosis not present

## 2024-02-16 DIAGNOSIS — Z5111 Encounter for antineoplastic chemotherapy: Secondary | ICD-10-CM

## 2024-02-16 DIAGNOSIS — I129 Hypertensive chronic kidney disease with stage 1 through stage 4 chronic kidney disease, or unspecified chronic kidney disease: Secondary | ICD-10-CM | POA: Diagnosis not present

## 2024-02-16 DIAGNOSIS — C9000 Multiple myeloma not having achieved remission: Secondary | ICD-10-CM | POA: Insufficient documentation

## 2024-02-16 DIAGNOSIS — Z7982 Long term (current) use of aspirin: Secondary | ICD-10-CM | POA: Diagnosis not present

## 2024-02-16 DIAGNOSIS — Z5112 Encounter for antineoplastic immunotherapy: Secondary | ICD-10-CM | POA: Insufficient documentation

## 2024-02-16 DIAGNOSIS — Z9851 Tubal ligation status: Secondary | ICD-10-CM | POA: Insufficient documentation

## 2024-02-16 DIAGNOSIS — Z803 Family history of malignant neoplasm of breast: Secondary | ICD-10-CM | POA: Diagnosis not present

## 2024-02-16 DIAGNOSIS — Z9089 Acquired absence of other organs: Secondary | ICD-10-CM | POA: Diagnosis not present

## 2024-02-16 DIAGNOSIS — M549 Dorsalgia, unspecified: Secondary | ICD-10-CM | POA: Insufficient documentation

## 2024-02-16 DIAGNOSIS — Z79899 Other long term (current) drug therapy: Secondary | ICD-10-CM | POA: Diagnosis not present

## 2024-02-16 DIAGNOSIS — C9001 Multiple myeloma in remission: Secondary | ICD-10-CM | POA: Diagnosis not present

## 2024-02-16 DIAGNOSIS — R5383 Other fatigue: Secondary | ICD-10-CM | POA: Diagnosis not present

## 2024-02-16 DIAGNOSIS — Z79620 Long term (current) use of immunosuppressive biologic: Secondary | ICD-10-CM | POA: Insufficient documentation

## 2024-02-16 DIAGNOSIS — E7800 Pure hypercholesterolemia, unspecified: Secondary | ICD-10-CM | POA: Diagnosis not present

## 2024-02-16 DIAGNOSIS — Z79624 Long term (current) use of inhibitors of nucleotide synthesis: Secondary | ICD-10-CM | POA: Diagnosis not present

## 2024-02-16 DIAGNOSIS — Z8249 Family history of ischemic heart disease and other diseases of the circulatory system: Secondary | ICD-10-CM | POA: Diagnosis not present

## 2024-02-16 DIAGNOSIS — D649 Anemia, unspecified: Secondary | ICD-10-CM | POA: Diagnosis not present

## 2024-02-16 DIAGNOSIS — Z823 Family history of stroke: Secondary | ICD-10-CM | POA: Insufficient documentation

## 2024-02-16 DIAGNOSIS — Z8261 Family history of arthritis: Secondary | ICD-10-CM | POA: Insufficient documentation

## 2024-02-16 DIAGNOSIS — I2510 Atherosclerotic heart disease of native coronary artery without angina pectoris: Secondary | ICD-10-CM | POA: Insufficient documentation

## 2024-02-16 LAB — CBC WITH DIFFERENTIAL/PLATELET
Abs Immature Granulocytes: 0.03 K/uL (ref 0.00–0.07)
Basophils Absolute: 0.1 K/uL (ref 0.0–0.1)
Basophils Relative: 1 %
Eosinophils Absolute: 0.2 K/uL (ref 0.0–0.5)
Eosinophils Relative: 3 %
HCT: 30.3 % — ABNORMAL LOW (ref 36.0–46.0)
Hemoglobin: 9.8 g/dL — ABNORMAL LOW (ref 12.0–15.0)
Immature Granulocytes: 1 %
Lymphocytes Relative: 32 %
Lymphs Abs: 1.7 K/uL (ref 0.7–4.0)
MCH: 31.7 pg (ref 26.0–34.0)
MCHC: 32.3 g/dL (ref 30.0–36.0)
MCV: 98.1 fL (ref 80.0–100.0)
Monocytes Absolute: 0.3 K/uL (ref 0.1–1.0)
Monocytes Relative: 6 %
Neutro Abs: 3 K/uL (ref 1.7–7.7)
Neutrophils Relative %: 57 %
Platelets: 226 K/uL (ref 150–400)
RBC: 3.09 MIL/uL — ABNORMAL LOW (ref 3.87–5.11)
RDW: 12.4 % (ref 11.5–15.5)
WBC: 5.2 K/uL (ref 4.0–10.5)
nRBC: 0 % (ref 0.0–0.2)

## 2024-02-16 LAB — COMPREHENSIVE METABOLIC PANEL WITH GFR
ALT: 16 U/L (ref 0–44)
AST: 23 U/L (ref 15–41)
Albumin: 3.7 g/dL (ref 3.5–5.0)
Alkaline Phosphatase: 53 U/L (ref 38–126)
Anion gap: 8 (ref 5–15)
BUN: 24 mg/dL — ABNORMAL HIGH (ref 8–23)
CO2: 24 mmol/L (ref 22–32)
Calcium: 9.4 mg/dL (ref 8.9–10.3)
Chloride: 111 mmol/L (ref 98–111)
Creatinine, Ser: 1.02 mg/dL — ABNORMAL HIGH (ref 0.44–1.00)
GFR, Estimated: 53 mL/min — ABNORMAL LOW (ref >60)
Glucose, Bld: 94 mg/dL (ref 70–99)
Potassium: 3.7 mmol/L (ref 3.5–5.1)
Sodium: 143 mmol/L (ref 135–145)
Total Bilirubin: 0.4 mg/dL (ref 0.0–1.2)
Total Protein: 6.2 g/dL — ABNORMAL LOW (ref 6.5–8.1)

## 2024-02-16 MED ORDER — DARATUMUMAB-HYALURONIDASE-FIHJ 1800-30000 MG-UT/15ML ~~LOC~~ SOLN
1800.0000 mg | Freq: Once | SUBCUTANEOUS | Status: AC
  Administered 2024-02-16: 1800 mg via SUBCUTANEOUS
  Filled 2024-02-16: qty 15

## 2024-02-16 MED ORDER — ACETAMINOPHEN 325 MG PO TABS
650.0000 mg | ORAL_TABLET | Freq: Once | ORAL | Status: DC
Start: 2024-02-16 — End: 2024-02-16

## 2024-02-16 MED ORDER — DIPHENHYDRAMINE HCL 25 MG PO CAPS
50.0000 mg | ORAL_CAPSULE | Freq: Once | ORAL | Status: DC
Start: 2024-02-16 — End: 2024-02-16

## 2024-02-16 NOTE — Assessment & Plan Note (Signed)
 Stable. Continue monitor

## 2024-02-16 NOTE — Progress Notes (Signed)
 Hematology/Oncology Progress note [phone] [phone]     CHIEF COMPLAINTS/REASON FOR VISIT:  Follow-up for multiple myeloma  ASSESSMENT & PLAN:   Cancer Staging  Multiple myeloma (HCC) Staging form: Plasma Cell Myeloma and Plasma Cell Disorders, AJCC 8th Edition - Clinical stage from 08/22/2021: RISS Stage II (Beta-2 -microglobulin (mg/L): 4.1, Albumin (g/dL): 2.8, ISS: Stage II, High-risk cytogenetics: Absent, LDH: Normal) - Signed by Babara Call, MD on 09/06/2021   Multiple myeloma (HCC) Multiple myeloma, IgA kappa, complex hyperploid gain of 1q, S/p Daratumumab  Rd.   Revlimid  D1-21,  07/23/22 bone marrow biopsy  3% plasma cell, no M protein.   Labs are reviewed and discussed with patient.  Lab Results  Component Value Date   MPROTEIN Comment (A) 01/19/2024   KPAFRELGTCHN 10.1 01/19/2024   LAMBDASER 5.5 (L) 01/19/2024   KAPLAMBRATIO 1.84 (H) 01/19/2024     M protein is stable. Stable light chain ratio.  Proceed with  Daratumumab  monotherapy maintenance - she takes home supply of Dexamethasone  prior to her Daratumumab .  On asprin 81mg  daily and Acyclovir  400mg  BID.   Encounter for antineoplastic chemotherapy Chemotherapy plan as listed as above.  Normocytic anemia Stable Continue monitor.   Stage 3a chronic kidney disease (HCC) recommend patient to avoid nephrotoxin.  Encourage oral hydration.  No orders of the defined types were placed in this encounter.    Follow up  Lab/MD/Dara in 4 weeks  All questions were answered. The patient knows to call the clinic with any problems, questions or concerns.  Call Babara, MD, PhD [facility] Hematology Oncology 02/16/2024     HISTORY OF PRESENTING ILLNESS:   Stephanie Delacruz is a  89 y.o.  female presents for follow up of multiple myeloma.  Oncology History  Multiple myeloma (HCC)  08/22/2021 Initial Diagnosis   Multiple myeloma (HCC) IgA kappa Initial M protein 5.2, free kappa light chain ratio  89.2, Kappa/lambda ratio 13.31   08/22/2021 Cancer Staging   Staging form: Plasma Cell Myeloma and Plasma Cell Disorders, AJCC 8th Edition - Clinical stage from 08/22/2021: RISS Stage II (Beta-2 -microglobulin (mg/L): 4.1, Albumin (g/dL): 2.8, ISS: Stage II, High-risk cytogenetics: Absent, LDH: Normal) - Signed by Babara Call, MD on 09/06/2021 Stage prefix: Initial diagnosis Beta 2 microglobulin range (mg/L): 3.5 to 5.49 Albumin range (g/dL): Less than 3.5 Cytogenetics: 1q addition   08/26/2021 - 09/01/2022 Chemotherapy   08/26/21 MYELOMA Daratumumab  SQ q28d  09/06/2021 added Revlimid  10mg  x 14 days.  09/27/2021 starting cycle 2 Revlimid  10 mg 21 days on, 7 days off. Weekly Dexamethasone .    08/26/2021 Bone Marrow Biopsy   Bone marrow biopsy showed hypercellular marrow with plasma cell neoplasm. 67% plasma cells in the aspirate. Cytogenetics showed complex hyperplasia, gain of 1 q.    07/23/2022 Bone Marrow Biopsy   Bone marrow biopsy showed Hypercellular bone marrow for age with trilineage hematopoiesis and 3% plasma cells  Cytogenetics are normal.   04/20/2023 Imaging   PET scan showed  1. Patchy hypermetabolism throughout the spine with focal hypermetabolism in the right third rib and left sacrum, compatible with residual metabolically active multiple myeloma. 2. Hypermetabolic 5 mm right cervical lymph node, indeterminate.Recommend attention on follow-up. 3. 3.6 cm thyroid  nodule, previously evaluated by ultrasound 08/27/2022 and biopsy 10/07/2022. Please refer to pathology report for further evaluation. No follow-up recommended unless clinically warranted. (Ref: J Am Coll Radiol. 2015 Feb;12(2): 143-50). 4. Aortic atherosclerosis (ICD10-I70.0). Coronary artery calcification.    S/p thyroid  lesion biopsy, obtained by endocrinologist. + AUS  INTERVAL  HISTORY Stephanie Delacruz is a 89 y.o. female who has above history reviewed by me today presents for follow up visit for management of multiple  myeloma She was accompanied by her daughter today.  She denies any nausea vomiting diarrhea.   No new complaints.   Review of Systems  Constitutional:  Positive for fatigue. Negative for appetite change, chills and fever.  HENT:   Negative for hearing loss and voice change.   Eyes:  Negative for eye problems.  Respiratory:  Negative for chest tightness and cough.   Cardiovascular:  Negative for chest pain.  Gastrointestinal:  Negative for abdominal distention, abdominal pain and blood in stool.  Endocrine: Negative for hot flashes.  Genitourinary:  Negative for difficulty urinating and frequency.   Musculoskeletal:  Positive for back pain. Negative for arthralgias.  Skin:  Negative for itching and rash.  Neurological:  Negative for extremity weakness.  Hematological:  Negative for adenopathy.  Psychiatric/Behavioral:  Negative for confusion.     MEDICAL HISTORY:  Past Medical History:  Diagnosis Date   Anemia    GERD (gastroesophageal reflux disease)    Hypercholesterolemia    Hypertension    Renal insufficiency     SURGICAL HISTORY: Past Surgical History:  Procedure Laterality Date   TONSILLECTOMY     TUBAL LIGATION      SOCIAL HISTORY: Social History   Socioeconomic History   Marital status: Married    Spouse name: Not on file   Number of children: Not on file   Years of education: Not on file   Highest education level: Not on file  Occupational History   Not on file  Tobacco Use   Smoking status: Never   Smokeless tobacco: Never  Vaping Use   Vaping status: Never Used  Substance and Sexual Activity   Alcohol use: No   Drug use: No   Sexual activity: Not on file  Other Topics Concern   Not on file  Social History Narrative   Lives with husband Lynwood and daughter Jon. No pets.    Social Drivers of Corporate investment banker Strain: Not on file  Food Insecurity: No Food Insecurity (01/11/2024)   Hunger Vital Sign    Worried About Running Out of  Food in the Last Year: Never true    Ran Out of Food in the Last Year: Never true  Transportation Needs: No Transportation Needs (01/11/2024)   PRAPARE - Administrator, Civil Service (Medical): No    Lack of Transportation (Non-Medical): No  Physical Activity: Not on file  Stress: Not on file  Social Connections: Not on file  Intimate Partner Violence: Not At Risk (01/11/2024)   Humiliation, Afraid, Rape, and Kick questionnaire    Fear of Current or Ex-Partner: No    Emotionally Abused: No    Physically Abused: No    Sexually Abused: No    FAMILY HISTORY: Family History  Problem Relation Age of Onset   Hypertension Mother    Arthritis Mother    Hypertension Father    Stroke Father    Breast cancer Niece     ALLERGIES:  has no known allergies.  MEDICATIONS:  Current Outpatient Medications  Medication Sig Dispense Refill   acyclovir  (ZOVIRAX ) 400 MG tablet Take 1 tablet (400 mg total) by mouth 2 (two) times daily. 60 tablet 11   amLODipine  (NORVASC ) 2.5 MG tablet Take 1 tablet (2.5 mg total) by mouth daily.     ascorbic acid (VITAMIN C)  250 MG tablet daily Take 2 tablets every day by oral route.     aspirin 81 MG EC tablet Take 81 mg by mouth daily.     Calcium Carb-Cholecalciferol (CALCIUM 600+D) 600-20 MG-MCG TABS Take 1 tablet by mouth 3 (three) times daily.     dexamethasone  (DECADRON ) 4 MG tablet Take 5 tablets (20 mg total) by mouth See admin instructions. Take 20mg  prior to each Daratumumab  treatment. 30 tablet 1   donepezil  (ARICEPT ) 5 MG tablet Take 10 mg by mouth daily.     fenofibrate  (TRICOR ) 48 MG tablet TAKE 1 TABLET(48 MG) BY MOUTH DAILY 90 tablet 2   fexofenadine (ALLEGRA) 180 MG tablet Take by mouth.     hydrochlorothiazide  (HYDRODIURIL ) 25 MG tablet TAKE 1/2 TABLET BY MOUTH EVERY DAY 45 tablet 3   memantine (NAMENDA) 5 MG tablet Take 5 mg by mouth 2 (two) times daily.     methimazole  (TAPAZOLE ) 5 MG tablet Take 1 tablet (5 mg total) by mouth daily.  90 tablet 3   metoprolol  succinate (TOPROL -XL) 25 MG 24 hr tablet Take 1 tablet (25 mg total) by mouth daily. 90 tablet 3   montelukast  (SINGULAIR ) 10 MG tablet Take 1 tablet (10 mg total) by mouth See admin instructions. Take 1 tablet one day before Daratumumab  injection. 60 tablet 0   Multiple Vitamins-Minerals (WOMENS MULTIVITAMIN PO) Take 1 tablet by mouth daily.     omeprazole  (PRILOSEC) 20 MG capsule TAKE 1 CAPSULE(20 MG) BY MOUTH DAILY 90 capsule 3   prochlorperazine  (COMPAZINE ) 10 MG tablet Take 10 mg by mouth every 6 (six) hours as needed for nausea or vomiting.     valsartan  (DIOVAN ) 160 MG tablet TAKE 1 TABLET(160 MG) BY MOUTH DAILY (Patient not taking: Reported on 02/16/2024) 90 tablet 1   No current facility-administered medications for this visit.   Facility-Administered Medications Ordered in Other Visits  Medication Dose Route Frequency Provider Last Rate Last Admin   acetaminophen  (TYLENOL ) tablet 650 mg  650 mg Oral Once Amana Bouska, MD       diphenhydrAMINE  (BENADRYL ) capsule 50 mg  50 mg Oral Once Babara Call, MD         PHYSICAL EXAMINATION: ECOG PERFORMANCE STATUS: 1 - Symptomatic but completely ambulatory Vitals:   02/16/24 0835 02/16/24 0840  BP: (!) 147/65 (!) 140/80  Pulse: 64   Resp: 18   Temp: 97.8 F (36.6 C)   SpO2: 100%    Filed Weights   02/16/24 0835  Weight: 153 lb (69.4 kg)    Physical Exam Constitutional:      General: She is not in acute distress. HENT:     Head: Normocephalic and atraumatic.  Eyes:     General: No scleral icterus. Cardiovascular:     Rate and Rhythm: Normal rate and regular rhythm.     Heart sounds: Normal heart sounds.  Pulmonary:     Effort: Pulmonary effort is normal. No respiratory distress.     Breath sounds: No wheezing.  Abdominal:     General: Bowel sounds are normal. There is no distension.     Palpations: Abdomen is soft.  Musculoskeletal:        General: No deformity. Normal range of motion.     Cervical  back: Normal range of motion and neck supple.  Skin:    General: Skin is warm and dry.     Findings: No erythema or rash.  Neurological:     Mental Status: She is alert and oriented to  person, place, and time. Mental status is at baseline.     Cranial Nerves: No cranial nerve deficit.     Coordination: Coordination normal.  Psychiatric:        Mood and Affect: Mood normal.     LABORATORY DATA:  I have reviewed the data as listed    Latest Ref Rng & Units 02/16/2024    8:03 AM 01/19/2024    8:34 AM 12/22/2023    8:44 AM  CBC  WBC 4.0 - 10.5 K/uL 5.2  5.0  6.4   Hemoglobin 12.0 - 15.0 g/dL 9.8  89.6  89.8   Hematocrit 36.0 - 46.0 % 30.3  31.7  31.7   Platelets 150 - 400 K/uL 226  231  235       Latest Ref Rng & Units 02/16/2024    8:03 AM 01/19/2024    8:34 AM 12/22/2023    8:44 AM  CMP  Glucose 70 - 99 mg/dL 94  86  93   BUN 8 - 23 mg/dL 24  19  20    Creatinine 0.44 - 1.00 mg/dL 8.97  8.92  8.88   Sodium 135 - 145 mmol/L 143  139  142   Potassium 3.5 - 5.1 mmol/L 3.7  3.6  3.8   Chloride 98 - 111 mmol/L 111  105  110   CO2 22 - 32 mmol/L PENDING  25  23   Calcium 8.9 - 10.3 mg/dL 9.4  9.7  8.8   Total Protein 6.5 - 8.1 g/dL 6.2  6.6  6.3   Total Bilirubin 0.0 - 1.2 mg/dL 0.4  0.5  0.4   Alkaline Phos 38 - 126 U/L 53  46  48   AST 15 - 41 U/L 23  25  26    ALT 0 - 44 U/L 16  16  15       Iron/TIBC/Ferritin/ %Sat    Component Value Date/Time   IRON 54 07/24/2021 1212   TIBC 277 07/24/2021 1212   FERRITIN 93 07/24/2021 1212   IRONPCTSAT 20 07/24/2021 1212       RADIOGRAPHIC STUDIES: I have personally reviewed the radiological images as listed and agreed with the findings in the report. No results found.

## 2024-02-16 NOTE — Assessment & Plan Note (Signed)
recommend patient to avoid nephrotoxin.  Encourage oral hydration. 

## 2024-02-16 NOTE — Progress Notes (Signed)
 Patient is doing fine, she doesn't have any new questions for the Doctor today.

## 2024-02-16 NOTE — Patient Instructions (Signed)
 [facility] - A DEPT OF [facility]  Discharge Instructions: Thank you for choosing [facility] to provide your oncology and hematology care.  If you have a lab appointment with the Cancer Center, please go directly to the Cancer Center and check in at the registration area.  Wear comfortable clothing and clothing appropriate for easy access to any Portacath or PICC line.   We strive to give you quality time with your provider. You may need to reschedule your appointment if you arrive late (15 or more minutes).  Arriving late affects you and other patients whose appointments are after yours.  Also, if you miss three or more appointments without notifying the office, you may be dismissed from the clinic at the provider's discretion.      For prescription refill requests, have your pharmacy contact our office and allow 72 hours for refills to be completed.    Today you received the following chemotherapy and/or immunotherapy agents DARZALEX      To help prevent nausea and vomiting after your treatment, we encourage you to take your nausea medication as directed.  BELOW ARE SYMPTOMS THAT SHOULD BE REPORTED IMMEDIATELY: *FEVER GREATER THAN 100.4 F (38 C) OR HIGHER *CHILLS OR SWEATING *NAUSEA AND VOMITING THAT IS NOT CONTROLLED WITH YOUR NAUSEA MEDICATION *UNUSUAL SHORTNESS OF BREATH *UNUSUAL BRUISING OR BLEEDING *URINARY PROBLEMS (pain or burning when urinating, or frequent urination) *BOWEL PROBLEMS (unusual diarrhea, constipation, pain near the anus) TENDERNESS IN MOUTH AND THROAT WITH OR WITHOUT PRESENCE OF ULCERS (sore throat, sores in mouth, or a toothache) UNUSUAL RASH, SWELLING OR PAIN  UNUSUAL VAGINAL DISCHARGE OR ITCHING   Items with * indicate a potential emergency and should be followed up as soon as possible or go to the Emergency Department if any problems should occur.  Please show the CHEMOTHERAPY ALERT CARD or IMMUNOTHERAPY  ALERT CARD at check-in to the Emergency Department and triage nurse.  Should you have questions after your visit or need to cancel or reschedule your appointment, please contact [facility] - A DEPT OF [facility]  [phone] and follow the prompts.  Office hours are 8:00 a.m. to 4:30 p.m. Monday - Friday. Please note that voicemails left after 4:00 p.m. may not be returned until the following business day.  We are closed weekends and major holidays. You have access to a nurse at all times for urgent questions. Please call the main number to the clinic [phone] and follow the prompts.  For any non-urgent questions, you may also contact your provider using MyChart. We now offer e-Visits for anyone 18 and older to request care online for non-urgent symptoms. For details visit [email or web address].   Also download the MyChart app! Go to the app store, search "MyChart", open the app, select , and log in with your MyChart username and password.  Daratumumab; Hyaluronidase Injection What is this medication? DARATUMUMAB; HYALURONIDASE (dar a toom ue mab; hye al ur ON i dase) treats multiple myeloma, a type of bone marrow cancer. Daratumumab works by blocking a protein that causes cancer cells to grow and multiply. This helps to slow or stop the spread of cancer cells. Hyaluronidase works by increasing the absorption of other medications in the body to help them work better. This medication may also be used treat amyloidosis, a condition that causes the buildup of a protein (amyloid) in your body. It works by reducing the buildup  of this protein, which decreases symptoms. It is a combination medication that contains a monoclonal antibody. This medicine may be used for other purposes; ask your health care provider or pharmacist if you have questions. COMMON BRAND NAME(S): DARZALEX FASPRO What should I tell my care team before I take this medication? They  need to know if you have any of these conditions: Heart disease Infection, such as chickenpox, cold sores, herpes, hepatitis B Lung or breathing disease An unusual or allergic reaction to daratumumab, hyaluronidase, other medications, foods, dyes, or preservatives Pregnant or trying to get pregnant Breast-feeding How should I use this medication? This medication is injected under the skin. It is given by your care team in a hospital or clinic setting. Talk to your care team about the use of this medication in children. Special care may be needed. Overdosage: If you think you have taken too much of this medicine contact a poison control center or emergency room at once. NOTE: This medicine is only for you. Do not share this medicine with others. What if I miss a dose? Keep appointments for follow-up doses. It is important not to miss your dose. Call your care team if you are unable to keep an appointment. What may interact with this medication? Interactions have not been studied. This list may not describe all possible interactions. Give your health care provider a list of all the medicines, herbs, non-prescription drugs, or dietary supplements you use. Also tell them if you smoke, drink alcohol, or use illegal drugs. Some items may interact with your medicine. What should I watch for while using this medication? Your condition will be monitored carefully while you are receiving this medication. This medication can cause serious allergic reactions. To reduce your risk, your care team may give you other medication to take before receiving this one. Be sure to follow the directions from your care team. This medication can affect the results of blood tests to match your blood type. These changes can last for up to 6 months after the final dose. Your care team will do blood tests to match your blood type before you start treatment. Tell all of your care team that you are being treated with this  medication before receiving a blood transfusion. This medication can affect the results of some tests used to determine treatment response; extra tests may be needed to evaluate response. Talk to your care team if you wish to become pregnant or think you are pregnant. This medication can cause serious birth defects if taken during pregnancy and for 3 months after the last dose. A reliable form of contraception is recommended while taking this medication and for 3 months after the last dose. Talk to your care team about effective forms of contraception. Do not breast-feed while taking this medication. What side effects may I notice from receiving this medication? Side effects that you should report to your care team as soon as possible: Allergic reactions--skin rash, itching, hives, swelling of the face, lips, tongue, or throat Heart rhythm changes--fast or irregular heartbeat, dizziness, feeling faint or lightheaded, chest pain, trouble breathing Infection--fever, chills, cough, sore throat, wounds that don't heal, pain or trouble when passing urine, general feeling of discomfort or being unwell Infusion reactions--chest pain, shortness of breath or trouble breathing, feeling faint or lightheaded Sudden eye pain or change in vision such as blurry vision, seeing halos around lights, vision loss Unusual bruising or bleeding Side effects that usually do not require medical attention (report to your  care team if they continue or are bothersome): Constipation Diarrhea Fatigue Nausea Pain, tingling, or numbness in the hands or feet Swelling of the ankles, hands, or feet This list may not describe all possible side effects. Call your doctor for medical advice about side effects. You may report side effects to FDA at 1-800-FDA-1088. Where should I keep my medication? This medication is given in a hospital or clinic. It will not be stored at home. NOTE: This sheet is a summary. It may not cover all  possible information. If you have questions about this medicine, talk to your doctor, pharmacist, or health care provider.  2024 Elsevier/Gold Standard (2021-11-19 00:00:00)

## 2024-02-16 NOTE — Progress Notes (Signed)
 Confirmed pt took decadron , tylenol , and benadryl  at home.

## 2024-02-16 NOTE — Assessment & Plan Note (Signed)
 Multiple myeloma, IgA kappa, complex hyperploid gain of 1q, S/p Daratumumab  Rd.   Revlimid  D1-21,  07/23/22 bone marrow biopsy  3% plasma cell, no M protein.   Labs are reviewed and discussed with patient.  Lab Results  Component Value Date   MPROTEIN Comment (A) 01/19/2024   KPAFRELGTCHN 10.1 01/19/2024   LAMBDASER 5.5 (L) 01/19/2024   KAPLAMBRATIO 1.84 (H) 01/19/2024     M protein is stable. Stable light chain ratio.  Proceed with  Daratumumab  monotherapy maintenance - she takes home supply of Dexamethasone  prior to her Daratumumab .  On asprin 81mg  daily and Acyclovir  400mg  BID.

## 2024-02-16 NOTE — Assessment & Plan Note (Signed)
Chemotherapy plan as listed as above. 

## 2024-02-17 DIAGNOSIS — Z9089 Acquired absence of other organs: Secondary | ICD-10-CM | POA: Diagnosis not present

## 2024-02-17 DIAGNOSIS — Z7982 Long term (current) use of aspirin: Secondary | ICD-10-CM | POA: Diagnosis not present

## 2024-02-17 DIAGNOSIS — E7800 Pure hypercholesterolemia, unspecified: Secondary | ICD-10-CM | POA: Diagnosis not present

## 2024-02-17 DIAGNOSIS — N1831 Chronic kidney disease, stage 3a: Secondary | ICD-10-CM | POA: Diagnosis not present

## 2024-02-17 DIAGNOSIS — E042 Nontoxic multinodular goiter: Secondary | ICD-10-CM | POA: Diagnosis not present

## 2024-02-17 DIAGNOSIS — I129 Hypertensive chronic kidney disease with stage 1 through stage 4 chronic kidney disease, or unspecified chronic kidney disease: Secondary | ICD-10-CM | POA: Diagnosis not present

## 2024-02-17 DIAGNOSIS — C9001 Multiple myeloma in remission: Secondary | ICD-10-CM | POA: Diagnosis not present

## 2024-02-17 DIAGNOSIS — K219 Gastro-esophageal reflux disease without esophagitis: Secondary | ICD-10-CM | POA: Diagnosis not present

## 2024-02-17 DIAGNOSIS — D631 Anemia in chronic kidney disease: Secondary | ICD-10-CM | POA: Diagnosis not present

## 2024-02-17 DIAGNOSIS — F0390 Unspecified dementia without behavioral disturbance: Secondary | ICD-10-CM | POA: Diagnosis not present

## 2024-02-17 DIAGNOSIS — Z556 Problems related to health literacy: Secondary | ICD-10-CM | POA: Diagnosis not present

## 2024-02-17 DIAGNOSIS — D630 Anemia in neoplastic disease: Secondary | ICD-10-CM | POA: Diagnosis not present

## 2024-02-17 LAB — MULTIPLE MYELOMA PANEL, SERUM
Albumin SerPl Elph-Mcnc: 3.4 g/dL (ref 2.9–4.4)
Albumin/Glob SerPl: 1.6 (ref 0.7–1.7)
Alpha 1: 0.2 g/dL (ref 0.0–0.4)
Alpha2 Glob SerPl Elph-Mcnc: 0.6 g/dL (ref 0.4–1.0)
B-Globulin SerPl Elph-Mcnc: 1.1 g/dL (ref 0.7–1.3)
Gamma Glob SerPl Elph-Mcnc: 0.3 g/dL — ABNORMAL LOW (ref 0.4–1.8)
Globulin, Total: 2.2 g/dL (ref 2.2–3.9)
IgA: 153 mg/dL (ref 64–422)
IgG (Immunoglobin G), Serum: 474 mg/dL — ABNORMAL LOW (ref 586–1602)
IgM (Immunoglobulin M), Srm: 20 mg/dL — ABNORMAL LOW (ref 26–217)
Total Protein ELP: 5.6 g/dL — ABNORMAL LOW (ref 6.0–8.5)

## 2024-02-17 LAB — KAPPA/LAMBDA LIGHT CHAINS
Kappa free light chain: 9.1 mg/L (ref 3.3–19.4)
Kappa, lambda light chain ratio: 2.28 — ABNORMAL HIGH (ref 0.26–1.65)
Lambda free light chains: 4 mg/L — ABNORMAL LOW (ref 5.7–26.3)

## 2024-02-22 DIAGNOSIS — C9001 Multiple myeloma in remission: Secondary | ICD-10-CM | POA: Diagnosis not present

## 2024-02-22 DIAGNOSIS — Z556 Problems related to health literacy: Secondary | ICD-10-CM | POA: Diagnosis not present

## 2024-02-22 DIAGNOSIS — Z9089 Acquired absence of other organs: Secondary | ICD-10-CM | POA: Diagnosis not present

## 2024-02-22 DIAGNOSIS — E7800 Pure hypercholesterolemia, unspecified: Secondary | ICD-10-CM | POA: Diagnosis not present

## 2024-02-22 DIAGNOSIS — D630 Anemia in neoplastic disease: Secondary | ICD-10-CM | POA: Diagnosis not present

## 2024-02-22 DIAGNOSIS — K219 Gastro-esophageal reflux disease without esophagitis: Secondary | ICD-10-CM | POA: Diagnosis not present

## 2024-02-22 DIAGNOSIS — Z7982 Long term (current) use of aspirin: Secondary | ICD-10-CM | POA: Diagnosis not present

## 2024-02-22 DIAGNOSIS — D631 Anemia in chronic kidney disease: Secondary | ICD-10-CM | POA: Diagnosis not present

## 2024-02-22 DIAGNOSIS — E042 Nontoxic multinodular goiter: Secondary | ICD-10-CM | POA: Diagnosis not present

## 2024-02-22 DIAGNOSIS — N1831 Chronic kidney disease, stage 3a: Secondary | ICD-10-CM | POA: Diagnosis not present

## 2024-02-22 DIAGNOSIS — F0390 Unspecified dementia without behavioral disturbance: Secondary | ICD-10-CM | POA: Diagnosis not present

## 2024-02-22 DIAGNOSIS — I129 Hypertensive chronic kidney disease with stage 1 through stage 4 chronic kidney disease, or unspecified chronic kidney disease: Secondary | ICD-10-CM | POA: Diagnosis not present

## 2024-03-08 ENCOUNTER — Telehealth: Payer: Self-pay | Admitting: Oncology

## 2024-03-08 NOTE — Telephone Encounter (Signed)
 MD will not be here until 9:45am on 8/19. I moved appts to a later time this day. Spoke with pt daughter and confirmed new time.

## 2024-03-14 NOTE — Assessment & Plan Note (Signed)
 Multiple myeloma, IgA kappa, complex hyperploid gain of 1q, S/p Daratumumab  Rd.   Revlimid  D1-21,  07/23/22 bone marrow biopsy  3% plasma cell, no M protein.   Labs are reviewed and discussed with patient.  Lab Results  Component Value Date   MPROTEIN Comment (A) 02/16/2024   KPAFRELGTCHN 9.1 02/16/2024   LAMBDASER 4.0 (L) 02/16/2024   KAPLAMBRATIO 2.28 (H) 02/16/2024     M protein is stable. Stable light chain ratio.  Proceed with  Daratumumab  monotherapy maintenance - she takes home supply of Dexamethasone  prior to her Daratumumab .  On asprin 81mg  daily and Acyclovir  400mg  BID.

## 2024-03-15 ENCOUNTER — Other Ambulatory Visit: Payer: Self-pay

## 2024-03-15 ENCOUNTER — Inpatient Hospital Stay (HOSPITAL_BASED_OUTPATIENT_CLINIC_OR_DEPARTMENT_OTHER): Admitting: Oncology

## 2024-03-15 ENCOUNTER — Inpatient Hospital Stay: Attending: Oncology

## 2024-03-15 ENCOUNTER — Ambulatory Visit: Admitting: Oncology

## 2024-03-15 ENCOUNTER — Ambulatory Visit

## 2024-03-15 ENCOUNTER — Other Ambulatory Visit

## 2024-03-15 ENCOUNTER — Inpatient Hospital Stay

## 2024-03-15 ENCOUNTER — Encounter: Payer: Self-pay | Admitting: Oncology

## 2024-03-15 VITALS — BP 158/70 | HR 62 | Temp 97.3°F | Resp 18 | Wt 155.8 lb

## 2024-03-15 DIAGNOSIS — D649 Anemia, unspecified: Secondary | ICD-10-CM

## 2024-03-15 DIAGNOSIS — Z803 Family history of malignant neoplasm of breast: Secondary | ICD-10-CM | POA: Insufficient documentation

## 2024-03-15 DIAGNOSIS — C9001 Multiple myeloma in remission: Secondary | ICD-10-CM | POA: Diagnosis not present

## 2024-03-15 DIAGNOSIS — Z79899 Other long term (current) drug therapy: Secondary | ICD-10-CM | POA: Insufficient documentation

## 2024-03-15 DIAGNOSIS — D631 Anemia in chronic kidney disease: Secondary | ICD-10-CM | POA: Diagnosis not present

## 2024-03-15 DIAGNOSIS — Z823 Family history of stroke: Secondary | ICD-10-CM | POA: Insufficient documentation

## 2024-03-15 DIAGNOSIS — C9000 Multiple myeloma not having achieved remission: Secondary | ICD-10-CM | POA: Diagnosis not present

## 2024-03-15 DIAGNOSIS — Z9089 Acquired absence of other organs: Secondary | ICD-10-CM | POA: Diagnosis not present

## 2024-03-15 DIAGNOSIS — D6481 Anemia due to antineoplastic chemotherapy: Secondary | ICD-10-CM | POA: Insufficient documentation

## 2024-03-15 DIAGNOSIS — M549 Dorsalgia, unspecified: Secondary | ICD-10-CM | POA: Diagnosis not present

## 2024-03-15 DIAGNOSIS — M899 Disorder of bone, unspecified: Secondary | ICD-10-CM | POA: Diagnosis not present

## 2024-03-15 DIAGNOSIS — Z8261 Family history of arthritis: Secondary | ICD-10-CM | POA: Insufficient documentation

## 2024-03-15 DIAGNOSIS — I2510 Atherosclerotic heart disease of native coronary artery without angina pectoris: Secondary | ICD-10-CM | POA: Diagnosis not present

## 2024-03-15 DIAGNOSIS — N1831 Chronic kidney disease, stage 3a: Secondary | ICD-10-CM

## 2024-03-15 DIAGNOSIS — Z5111 Encounter for antineoplastic chemotherapy: Secondary | ICD-10-CM

## 2024-03-15 DIAGNOSIS — Z5112 Encounter for antineoplastic immunotherapy: Secondary | ICD-10-CM | POA: Diagnosis not present

## 2024-03-15 DIAGNOSIS — T451X5A Adverse effect of antineoplastic and immunosuppressive drugs, initial encounter: Secondary | ICD-10-CM | POA: Diagnosis not present

## 2024-03-15 DIAGNOSIS — I129 Hypertensive chronic kidney disease with stage 1 through stage 4 chronic kidney disease, or unspecified chronic kidney disease: Secondary | ICD-10-CM | POA: Insufficient documentation

## 2024-03-15 DIAGNOSIS — Z7982 Long term (current) use of aspirin: Secondary | ICD-10-CM | POA: Insufficient documentation

## 2024-03-15 DIAGNOSIS — Z8249 Family history of ischemic heart disease and other diseases of the circulatory system: Secondary | ICD-10-CM | POA: Insufficient documentation

## 2024-03-15 DIAGNOSIS — I700 Atherosclerosis of aorta: Secondary | ICD-10-CM | POA: Insufficient documentation

## 2024-03-15 DIAGNOSIS — R5383 Other fatigue: Secondary | ICD-10-CM | POA: Diagnosis not present

## 2024-03-15 LAB — FERRITIN: Ferritin: 52 ng/mL (ref 11–307)

## 2024-03-15 LAB — COMPREHENSIVE METABOLIC PANEL WITH GFR
ALT: 14 U/L (ref 0–44)
AST: 24 U/L (ref 15–41)
Albumin: 3.5 g/dL (ref 3.5–5.0)
Alkaline Phosphatase: 49 U/L (ref 38–126)
Anion gap: 9 (ref 5–15)
BUN: 21 mg/dL (ref 8–23)
CO2: 24 mmol/L (ref 22–32)
Calcium: 8.9 mg/dL (ref 8.9–10.3)
Chloride: 108 mmol/L (ref 98–111)
Creatinine, Ser: 1.03 mg/dL — ABNORMAL HIGH (ref 0.44–1.00)
GFR, Estimated: 52 mL/min — ABNORMAL LOW (ref >60)
Glucose, Bld: 85 mg/dL (ref 70–99)
Potassium: 3.6 mmol/L (ref 3.5–5.1)
Sodium: 141 mmol/L (ref 135–145)
Total Bilirubin: 0.4 mg/dL (ref 0.0–1.2)
Total Protein: 6.3 g/dL — ABNORMAL LOW (ref 6.5–8.1)

## 2024-03-15 LAB — CBC WITH DIFFERENTIAL/PLATELET
Abs Immature Granulocytes: 0.02 K/uL (ref 0.00–0.07)
Basophils Absolute: 0.1 K/uL (ref 0.0–0.1)
Basophils Relative: 1 %
Eosinophils Absolute: 0.2 K/uL (ref 0.0–0.5)
Eosinophils Relative: 3 %
HCT: 30.5 % — ABNORMAL LOW (ref 36.0–46.0)
Hemoglobin: 9.9 g/dL — ABNORMAL LOW (ref 12.0–15.0)
Immature Granulocytes: 0 %
Lymphocytes Relative: 32 %
Lymphs Abs: 1.7 K/uL (ref 0.7–4.0)
MCH: 31.8 pg (ref 26.0–34.0)
MCHC: 32.5 g/dL (ref 30.0–36.0)
MCV: 98.1 fL (ref 80.0–100.0)
Monocytes Absolute: 0.3 K/uL (ref 0.1–1.0)
Monocytes Relative: 6 %
Neutro Abs: 2.9 K/uL (ref 1.7–7.7)
Neutrophils Relative %: 58 %
Platelets: 200 K/uL (ref 150–400)
RBC: 3.11 MIL/uL — ABNORMAL LOW (ref 3.87–5.11)
RDW: 12.9 % (ref 11.5–15.5)
WBC: 5.1 K/uL (ref 4.0–10.5)
nRBC: 0 % (ref 0.0–0.2)

## 2024-03-15 LAB — RETIC PANEL
Immature Retic Fract: 12.6 % (ref 2.3–15.9)
RBC.: 3.1 MIL/uL — ABNORMAL LOW (ref 3.87–5.11)
Retic Count, Absolute: 45 K/uL (ref 19.0–186.0)
Retic Ct Pct: 1.5 % (ref 0.4–3.1)
Reticulocyte Hemoglobin: 32.8 pg (ref >27.9)

## 2024-03-15 LAB — IRON AND TIBC
Iron: 67 ug/dL (ref 28–170)
Saturation Ratios: 16 % (ref 10.4–31.8)
TIBC: 427 ug/dL (ref 250–450)
UIBC: 360 ug/dL

## 2024-03-15 MED ORDER — ACETAMINOPHEN 325 MG PO TABS: 650.0000 mg | ORAL_TABLET | Freq: Once | ORAL | Status: DC

## 2024-03-15 MED ORDER — DIPHENHYDRAMINE HCL 25 MG PO CAPS: 25.0000 mg | ORAL_CAPSULE | Freq: Once | ORAL | Status: DC

## 2024-03-15 MED ORDER — DARATUMUMAB-HYALURONIDASE-FIHJ 1800-30000 MG-UT/15ML ~~LOC~~ SOLN
1800.0000 mg | Freq: Once | SUBCUTANEOUS | Status: AC
  Administered 2024-03-15: 1800 mg via SUBCUTANEOUS
  Filled 2024-03-15: qty 15

## 2024-03-15 NOTE — Patient Instructions (Signed)

## 2024-03-15 NOTE — Progress Notes (Signed)
 Hematology/Oncology Progress note [phone] [phone]) [phone]     CHIEF COMPLAINTS/REASON FOR VISIT:  Follow-up for multiple myeloma  ASSESSMENT & PLAN:   Cancer Staging  Multiple myeloma (HCC) Staging form: Plasma Cell Myeloma and Plasma Cell Disorders, AJCC 8th Edition - Clinical stage from 08/22/2021: RISS Stage II (Beta-2 -microglobulin (mg/L): 4.1, Albumin (g/dL): 2.8, ISS: Stage II, High-risk cytogenetics: Absent, LDH: Normal) - Signed by Babara Call, MD on 09/06/2021   Multiple myeloma (HCC) Multiple myeloma, IgA kappa, complex hyperploid gain of 1q, S/p Daratumumab  Rd.   Revlimid  D1-21,  07/23/22 bone marrow biopsy  3% plasma cell, no M protein.   Labs are reviewed and discussed with patient.  Lab Results  Component Value Date   MPROTEIN Comment (A) 02/16/2024   KPAFRELGTCHN 9.1 02/16/2024   LAMBDASER 4.0 (L) 02/16/2024   KAPLAMBRATIO 2.28 (H) 02/16/2024     M protein is stable. Stable light chain ratio.  Proceed with  Daratumumab  monotherapy maintenance - she takes home supply of Dexamethasone  prior to her Daratumumab .  On asprin 81mg  daily and Acyclovir  400mg  BID.   Bone lesion 04/20/23 PET scan whole body,  Patchy hypermetabolism throughout the spine with focal hypermetabolism in the right third rib and left sacrum, compatible with residual metabolically active multiple myeloma.  Continue Q3 months Zometa - next due 09/ 2025 Recommend calcium 1200 mg daily.  Encounter for antineoplastic chemotherapy Chemotherapy plan as listed as above.  Normocytic anemia Gradually trending down.  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  Check iron panel.    Stage 3a chronic kidney disease (HCC) recommend patient to avoid nephrotoxin.  Encourage oral hydration.  Orders Placed This Encounter  Procedures   Ferritin    Standing Status:   Future    Number of Occurrences:   1    Expected Date:   03/15/2024    Expiration Date:   06/13/2024   Retic Panel     Standing Status:   Future    Number of Occurrences:   1    Expected Date:   03/15/2024    Expiration Date:   06/13/2024   Iron and TIBC    Standing Status:   Future    Number of Occurrences:   1    Expected Date:   03/15/2024    Expiration Date:   06/13/2024     Follow up  Lab/MD/Dara in 4 weeks  All questions were answered. The patient knows to call the clinic with any problems, questions or concerns.  Call Babara, MD, PhD [facility] Hematology Oncology 03/15/2024     HISTORY OF PRESENTING ILLNESS:   Stephanie Delacruz is a  89 y.o.  female presents for follow up of multiple myeloma.  Oncology History  Multiple myeloma (HCC)  08/22/2021 Initial Diagnosis   Multiple myeloma (HCC) IgA kappa Initial M protein 5.2, free kappa light chain ratio 89.2, Kappa/lambda ratio 13.31   08/22/2021 Cancer Staging   Staging form: Plasma Cell Myeloma and Plasma Cell Disorders, AJCC 8th Edition - Clinical stage from 08/22/2021: RISS Stage II (Beta-2 -microglobulin (mg/L): 4.1, Albumin (g/dL): 2.8, ISS: Stage II, High-risk cytogenetics: Absent, LDH: Normal) - Signed by Babara Call, MD on 09/06/2021 Stage prefix: Initial diagnosis Beta 2 microglobulin range (mg/L): 3.5 to 5.49 Albumin range (g/dL): Less than 3.5 Cytogenetics: 1q addition   08/26/2021 - 09/01/2022 Chemotherapy   08/26/21 MYELOMA Daratumumab  SQ q28d  09/06/2021 added Revlimid  10mg  x 14 days.  09/27/2021 starting cycle 2 Revlimid  10 mg 21 days on, 7 days  off. Weekly Dexamethasone .    08/26/2021 Bone Marrow Biopsy   Bone marrow biopsy showed hypercellular marrow with plasma cell neoplasm. 67% plasma cells in the aspirate. Cytogenetics showed complex hyperplasia, gain of 1 q.    07/23/2022 Bone Marrow Biopsy   Bone marrow biopsy showed Hypercellular bone marrow for age with trilineage hematopoiesis and 3% plasma cells  Cytogenetics are normal.   04/20/2023 Imaging   PET scan showed  1. Patchy hypermetabolism throughout the spine with focal  hypermetabolism in the right third rib and left sacrum, compatible with residual metabolically active multiple myeloma. 2. Hypermetabolic 5 mm right cervical lymph node, indeterminate.Recommend attention on follow-up. 3. 3.6 cm thyroid  nodule, previously evaluated by ultrasound 08/27/2022 and biopsy 10/07/2022. Please refer to pathology report for further evaluation. No follow-up recommended unless clinically warranted. (Ref: J Am Coll Radiol. 2015 Feb;12(2): 143-50). 4. Aortic atherosclerosis (ICD10-I70.0). Coronary artery calcification.    S/p thyroid  lesion biopsy, obtained by endocrinologist. + AUS  INTERVAL HISTORY Stephanie Delacruz is a 89 y.o. female who has above history reviewed by me today presents for follow up visit for management of multiple myeloma She was accompanied by her daughter today.  She denies any nausea vomiting diarrhea.   No new complaints.   Review of Systems  Constitutional:  Positive for fatigue. Negative for appetite change, chills and fever.  HENT:   Negative for hearing loss and voice change.   Eyes:  Negative for eye problems.  Respiratory:  Negative for chest tightness and cough.   Cardiovascular:  Negative for chest pain.  Gastrointestinal:  Negative for abdominal distention, abdominal pain and blood in stool.  Endocrine: Negative for hot flashes.  Genitourinary:  Negative for difficulty urinating and frequency.   Musculoskeletal:  Positive for back pain. Negative for arthralgias.  Skin:  Negative for itching and rash.  Neurological:  Negative for extremity weakness.  Hematological:  Negative for adenopathy.  Psychiatric/Behavioral:  Negative for confusion.     MEDICAL HISTORY:  Past Medical History:  Diagnosis Date   Anemia    GERD (gastroesophageal reflux disease)    Hypercholesterolemia    Hypertension    Renal insufficiency     SURGICAL HISTORY: Past Surgical History:  Procedure Laterality Date   TONSILLECTOMY     TUBAL LIGATION       SOCIAL HISTORY: Social History   Socioeconomic History   Marital status: Married    Spouse name: Not on file   Number of children: Not on file   Years of education: Not on file   Highest education level: Not on file  Occupational History   Not on file  Tobacco Use   Smoking status: Never   Smokeless tobacco: Never  Vaping Use   Vaping status: Never Used  Substance and Sexual Activity   Alcohol use: No   Drug use: No   Sexual activity: Not on file  Other Topics Concern   Not on file  Social History Narrative   Lives with husband Lynwood and daughter Jon. No pets.    Social Drivers of Corporate investment banker Strain: Not on file  Food Insecurity: No Food Insecurity (01/11/2024)   Hunger Vital Sign    Worried About Running Out of Food in the Last Year: Never true    Ran Out of Food in the Last Year: Never true  Transportation Needs: No Transportation Needs (01/11/2024)   PRAPARE - Administrator, Civil Service (Medical): No    Lack of Transportation (  Non-Medical): No  Physical Activity: Not on file  Stress: Not on file  Social Connections: Not on file  Intimate Partner Violence: Not At Risk (01/11/2024)   Humiliation, Afraid, Rape, and Kick questionnaire    Fear of Current or Ex-Partner: No    Emotionally Abused: No    Physically Abused: No    Sexually Abused: No    FAMILY HISTORY: Family History  Problem Relation Age of Onset   Hypertension Mother    Arthritis Mother    Hypertension Father    Stroke Father    Breast cancer Niece     ALLERGIES:  has no known allergies.  MEDICATIONS:  Current Outpatient Medications  Medication Sig Dispense Refill   acyclovir  (ZOVIRAX ) 400 MG tablet Take 1 tablet (400 mg total) by mouth 2 (two) times daily. 60 tablet 11   amLODipine  (NORVASC ) 2.5 MG tablet Take 1 tablet (2.5 mg total) by mouth daily.     ascorbic acid (VITAMIN C) 250 MG tablet daily Take 2 tablets every day by oral route.     aspirin 81 MG  EC tablet Take 81 mg by mouth daily.     Calcium Carb-Cholecalciferol (CALCIUM 600+D) 600-20 MG-MCG TABS Take 1 tablet by mouth 3 (three) times daily.     dexamethasone  (DECADRON ) 4 MG tablet Take 5 tablets (20 mg total) by mouth See admin instructions. Take 20mg  prior to each Daratumumab  treatment. 30 tablet 1   donepezil  (ARICEPT ) 5 MG tablet Take 10 mg by mouth daily.     fenofibrate  (TRICOR ) 48 MG tablet TAKE 1 TABLET(48 MG) BY MOUTH DAILY 90 tablet 2   fexofenadine (ALLEGRA) 180 MG tablet Take by mouth.     hydrochlorothiazide  (HYDRODIURIL ) 25 MG tablet TAKE 1/2 TABLET BY MOUTH EVERY DAY 45 tablet 3   memantine (NAMENDA) 5 MG tablet Take 5 mg by mouth 2 (two) times daily.     methimazole  (TAPAZOLE ) 5 MG tablet Take 1 tablet (5 mg total) by mouth daily. 90 tablet 3   metoprolol  succinate (TOPROL -XL) 25 MG 24 hr tablet Take 1 tablet (25 mg total) by mouth daily. 90 tablet 3   montelukast  (SINGULAIR ) 10 MG tablet Take 1 tablet (10 mg total) by mouth See admin instructions. Take 1 tablet one day before Daratumumab  injection. 60 tablet 0   Multiple Vitamins-Minerals (WOMENS MULTIVITAMIN PO) Take 1 tablet by mouth daily.     omeprazole  (PRILOSEC) 20 MG capsule TAKE 1 CAPSULE(20 MG) BY MOUTH DAILY 90 capsule 3   prochlorperazine  (COMPAZINE ) 10 MG tablet Take 10 mg by mouth every 6 (six) hours as needed for nausea or vomiting.     valsartan  (DIOVAN ) 160 MG tablet TAKE 1 TABLET(160 MG) BY MOUTH DAILY (Patient not taking: Reported on 03/15/2024) 90 tablet 1   No current facility-administered medications for this visit.   Facility-Administered Medications Ordered in Other Visits  Medication Dose Route Frequency Provider Last Rate Last Admin   acetaminophen  (TYLENOL ) tablet 650 mg  650 mg Oral Once Kingsly Kloepfer, MD       diphenhydrAMINE  (BENADRYL ) capsule 25 mg  25 mg Oral Once Babara Call, MD         PHYSICAL EXAMINATION: ECOG PERFORMANCE STATUS: 1 - Symptomatic but completely ambulatory Vitals:    03/15/24 0953 03/15/24 0959  BP: (!) 165/73 (!) 158/70  Pulse: 62   Resp: 18   Temp: (!) 97.3 F (36.3 C)   SpO2: 100%    Filed Weights   03/15/24 0953  Weight: 155 lb 12.8  oz (70.7 kg)    Physical Exam Constitutional:      General: She is not in acute distress. HENT:     Head: Normocephalic and atraumatic.  Eyes:     General: No scleral icterus. Cardiovascular:     Rate and Rhythm: Normal rate and regular rhythm.     Heart sounds: Normal heart sounds.  Pulmonary:     Effort: Pulmonary effort is normal. No respiratory distress.     Breath sounds: No wheezing.  Abdominal:     General: Bowel sounds are normal. There is no distension.     Palpations: Abdomen is soft.  Musculoskeletal:        General: No deformity. Normal range of motion.     Cervical back: Normal range of motion and neck supple.  Skin:    General: Skin is warm and dry.     Findings: No erythema or rash.  Neurological:     Mental Status: She is alert and oriented to person, place, and time. Mental status is at baseline.     Cranial Nerves: No cranial nerve deficit.     Coordination: Coordination normal.  Psychiatric:        Mood and Affect: Mood normal.     LABORATORY DATA:  I have reviewed the data as listed    Latest Ref Rng & Units 03/15/2024    9:03 AM 02/16/2024    8:03 AM 01/19/2024    8:34 AM  CBC  WBC 4.0 - 10.5 K/uL 5.1  5.2  5.0   Hemoglobin 12.0 - 15.0 g/dL 9.9  9.8  89.6   Hematocrit 36.0 - 46.0 % 30.5  30.3  31.7   Platelets 150 - 400 K/uL 200  226  231       Latest Ref Rng & Units 03/15/2024    9:03 AM 02/16/2024    8:03 AM 01/19/2024    8:34 AM  CMP  Glucose 70 - 99 mg/dL 85  94  86   BUN 8 - 23 mg/dL 21  24  19    Creatinine 0.44 - 1.00 mg/dL 8.96  8.97  8.92   Sodium 135 - 145 mmol/L 141  143  139   Potassium 3.5 - 5.1 mmol/L 3.6  3.7  3.6   Chloride 98 - 111 mmol/L 108  111  105   CO2 22 - 32 mmol/L 24  24  25    Calcium 8.9 - 10.3 mg/dL 8.9  9.4  9.7   Total Protein 6.5 -  8.1 g/dL 6.3  6.2  6.6   Total Bilirubin 0.0 - 1.2 mg/dL 0.4  0.4  0.5   Alkaline Phos 38 - 126 U/L 49  53  46   AST 15 - 41 U/L 24  23  25    ALT 0 - 44 U/L 14  16  16       Iron/TIBC/Ferritin/ %Sat    Component Value Date/Time   IRON 54 07/24/2021 1212   TIBC 277 07/24/2021 1212   FERRITIN 93 07/24/2021 1212   IRONPCTSAT 20 07/24/2021 1212       RADIOGRAPHIC STUDIES: I have personally reviewed the radiological images as listed and agreed with the findings in the report. No results found.

## 2024-03-15 NOTE — Assessment & Plan Note (Addendum)
 Gradually trending down.  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  Check iron panel.

## 2024-03-15 NOTE — Assessment & Plan Note (Signed)
Chemotherapy plan as listed as above. 

## 2024-03-15 NOTE — Assessment & Plan Note (Signed)
 04/20/23 PET scan whole body,  Patchy hypermetabolism throughout the spine with focal hypermetabolism in the right third rib and left sacrum, compatible with residual metabolically active multiple myeloma.  Continue Q3 months Zometa - next due 09/ 2025 Recommend calcium 1200 mg daily.

## 2024-03-15 NOTE — Assessment & Plan Note (Signed)
recommend patient to avoid nephrotoxin.  Encourage oral hydration. 

## 2024-03-16 LAB — KAPPA/LAMBDA LIGHT CHAINS
Kappa free light chain: 8.7 mg/L (ref 3.3–19.4)
Kappa, lambda light chain ratio: 2.02 — ABNORMAL HIGH (ref 0.26–1.65)
Lambda free light chains: 4.3 mg/L — ABNORMAL LOW (ref 5.7–26.3)

## 2024-03-17 LAB — MULTIPLE MYELOMA PANEL, SERUM
Albumin SerPl Elph-Mcnc: 3.3 g/dL (ref 2.9–4.4)
Albumin/Glob SerPl: 1.4 (ref 0.7–1.7)
Alpha 1: 0.2 g/dL (ref 0.0–0.4)
Alpha2 Glob SerPl Elph-Mcnc: 0.7 g/dL (ref 0.4–1.0)
B-Globulin SerPl Elph-Mcnc: 1.2 g/dL (ref 0.7–1.3)
Gamma Glob SerPl Elph-Mcnc: 0.4 g/dL (ref 0.4–1.8)
Globulin, Total: 2.4 g/dL (ref 2.2–3.9)
IgA: 174 mg/dL (ref 64–422)
IgG (Immunoglobin G), Serum: 523 mg/dL — ABNORMAL LOW (ref 586–1602)
IgM (Immunoglobulin M), Srm: 19 mg/dL — ABNORMAL LOW (ref 26–217)
Total Protein ELP: 5.7 g/dL — ABNORMAL LOW (ref 6.0–8.5)

## 2024-04-04 ENCOUNTER — Ambulatory Visit: Admitting: Internal Medicine

## 2024-04-04 VITALS — BP 128/70 | HR 64 | Resp 16 | Ht 62.0 in | Wt 153.4 lb

## 2024-04-04 DIAGNOSIS — E7800 Pure hypercholesterolemia, unspecified: Secondary | ICD-10-CM | POA: Diagnosis not present

## 2024-04-04 DIAGNOSIS — R739 Hyperglycemia, unspecified: Secondary | ICD-10-CM | POA: Diagnosis not present

## 2024-04-04 DIAGNOSIS — Z23 Encounter for immunization: Secondary | ICD-10-CM

## 2024-04-04 DIAGNOSIS — E042 Nontoxic multinodular goiter: Secondary | ICD-10-CM

## 2024-04-04 DIAGNOSIS — C9001 Multiple myeloma in remission: Secondary | ICD-10-CM

## 2024-04-04 DIAGNOSIS — F0390 Unspecified dementia without behavioral disturbance: Secondary | ICD-10-CM

## 2024-04-04 DIAGNOSIS — N1831 Chronic kidney disease, stage 3a: Secondary | ICD-10-CM | POA: Diagnosis not present

## 2024-04-04 DIAGNOSIS — I10 Essential (primary) hypertension: Secondary | ICD-10-CM

## 2024-04-04 DIAGNOSIS — Z862 Personal history of diseases of the blood and blood-forming organs and certain disorders involving the immune mechanism: Secondary | ICD-10-CM

## 2024-04-04 LAB — LIPID PANEL
Cholesterol: 230 mg/dL — ABNORMAL HIGH (ref 0–200)
HDL: 88.9 mg/dL (ref >39.00)
LDL Cholesterol: 128 mg/dL — ABNORMAL HIGH (ref 0–99)
NonHDL: 141.17
Total CHOL/HDL Ratio: 3
Triglycerides: 64 mg/dL (ref 0.0–149.0)
VLDL: 12.8 mg/dL (ref 0.0–40.0)

## 2024-04-04 LAB — HEMOGLOBIN A1C: Hgb A1c MFr Bld: 6.1 % (ref 4.6–6.5)

## 2024-04-04 LAB — HEPATIC FUNCTION PANEL
ALT: 13 U/L (ref 0–35)
AST: 21 U/L (ref 0–37)
Albumin: 4 g/dL (ref 3.5–5.2)
Alkaline Phosphatase: 43 U/L (ref 39–117)
Bilirubin, Direct: 0.1 mg/dL (ref 0.0–0.3)
Total Bilirubin: 0.4 mg/dL (ref 0.2–1.2)
Total Protein: 6.4 g/dL (ref 6.0–8.3)

## 2024-04-04 LAB — BASIC METABOLIC PANEL WITH GFR
BUN: 17 mg/dL (ref 6–23)
CO2: 27 meq/L (ref 19–32)
Calcium: 9.9 mg/dL (ref 8.4–10.5)
Chloride: 106 meq/L (ref 96–112)
Creatinine, Ser: 1.06 mg/dL (ref 0.40–1.20)
GFR: 46.99 mL/min — ABNORMAL LOW (ref >60.00)
Glucose, Bld: 86 mg/dL (ref 70–99)
Potassium: 4.3 meq/L (ref 3.5–5.1)
Sodium: 141 meq/L (ref 135–145)

## 2024-04-04 LAB — TSH: TSH: 1.9 u[IU]/mL (ref 0.35–5.50)

## 2024-04-04 NOTE — Progress Notes (Unsigned)
 Subjective:    Patient ID: Stephanie Delacruz, female    DOB: 01-Jun-1934, 89 y.o.   MRN: [id number]  Patient here for  Chief Complaint  Patient presents with   Medical Management of Chronic Issues    HPI Here for a scheduled follow up - follow up regarding hypertension, hypercholesterolemia and multiple myeloma. She is accompanied by her daughter. History obtained from both of them. Being followed by hematology for multiple myeloma. Last evaluated 03/15/24 - M protein stable. Stable light chain ratio. Continued Daratumumab . Cotinues aspirin daily and acyclovir  400mg  bid. Continue q 3 months zometa  - next due 03/2024. Had f/u with neurology 11/18/23 - f/u [facility]. Continue donepezil  and namenda. Stable. Had f/u with cardiology 10/29/23 - stable on toprol  XL 25mg  q day and amlodipine  2.5mg  q day. ECHO 06/2023 - EF 60-65%. Had f/u with [facility] 12/10/23 - f/u lymphedema. Cotinue compression hose.    Past Medical History:  Diagnosis Date   Anemia    GERD (gastroesophageal reflux disease)    Hypercholesterolemia    Hypertension    Renal insufficiency    Past Surgical History:  Procedure Laterality Date   TONSILLECTOMY     TUBAL LIGATION     Family History  Problem Relation Age of Onset   Hypertension Mother    Arthritis Mother    Hypertension Father    Stroke Father    Breast cancer Niece    Social History   Socioeconomic History   Marital status: Married    Spouse name: Not on file   Number of children: Not on file   Years of education: Not on file   Highest education level: Not on file  Occupational History   Not on file  Tobacco Use   Smoking status: Never   Smokeless tobacco: Never  Vaping Use   Vaping status: Never Used  Substance and Sexual Activity   Alcohol use: No   Drug use: No   Sexual activity: Not on file  Other Topics Concern   Not on file  Social History Narrative   Lives with husband Lynwood and daughter Jon. No pets.    Social Drivers of Research scientist (physical sciences) Strain: Not on file  Food Insecurity: No Food Insecurity (01/11/2024)   Hunger Vital Sign    Worried About Running Out of Food in the Last Year: Never true    Ran Out of Food in the Last Year: Never true  Transportation Needs: No Transportation Needs (01/11/2024)   PRAPARE - Administrator, Civil Service (Medical): No    Lack of Transportation (Non-Medical): No  Physical Activity: Not on file  Stress: Not on file  Social Connections: Not on file     Review of Systems     Objective:     BP 128/70   Pulse 64   Resp 16   Ht 5' 2 (1.575 m)   Wt 153 lb 6.4 oz (69.6 kg)   SpO2 98%   BMI 28.06 kg/m  Wt Readings from Last 3 Encounters:  04/04/24 153 lb 6.4 oz (69.6 kg)  03/15/24 155 lb 12.8 oz (70.7 kg)  02/16/24 153 lb (69.4 kg)    Physical Exam  {Perform Simple Foot Exam  Perform Detailed exam:1} {Insert foot Exam (Optional):30965}   Outpatient Encounter Medications as of 04/04/2024  Medication Sig   acyclovir  (ZOVIRAX ) 400 MG tablet Take 1 tablet (400 mg total) by mouth 2 (two) times daily.   amLODipine  (NORVASC ) 2.5 MG tablet  Take 1 tablet (2.5 mg total) by mouth daily.   ascorbic acid (VITAMIN C) 250 MG tablet daily Take 2 tablets every day by oral route.   aspirin 81 MG EC tablet Take 81 mg by mouth daily.   Calcium Carb-Cholecalciferol (CALCIUM 600+D) 600-20 MG-MCG TABS Take 1 tablet by mouth 3 (three) times daily.   dexamethasone  (DECADRON ) 4 MG tablet Take 5 tablets (20 mg total) by mouth See admin instructions. Take 20mg  prior to each Daratumumab  treatment.   donepezil  (ARICEPT ) 5 MG tablet Take 10 mg by mouth daily.   fenofibrate  (TRICOR ) 48 MG tablet TAKE 1 TABLET(48 MG) BY MOUTH DAILY   fexofenadine (ALLEGRA) 180 MG tablet Take by mouth.   hydrochlorothiazide  (HYDRODIURIL ) 25 MG tablet TAKE 1/2 TABLET BY MOUTH EVERY DAY   memantine (NAMENDA) 5 MG tablet Take 5 mg by mouth 2 (two) times daily.   methimazole  (TAPAZOLE ) 5 MG tablet Take 1  tablet (5 mg total) by mouth daily.   metoprolol  succinate (TOPROL -XL) 25 MG 24 hr tablet Take 1 tablet (25 mg total) by mouth daily.   montelukast  (SINGULAIR ) 10 MG tablet Take 1 tablet (10 mg total) by mouth See admin instructions. Take 1 tablet one day before Daratumumab  injection.   Multiple Vitamins-Minerals (WOMENS MULTIVITAMIN PO) Take 1 tablet by mouth daily.   omeprazole  (PRILOSEC) 20 MG capsule TAKE 1 CAPSULE(20 MG) BY MOUTH DAILY   prochlorperazine  (COMPAZINE ) 10 MG tablet Take 10 mg by mouth every 6 (six) hours as needed for nausea or vomiting.   valsartan  (DIOVAN ) 160 MG tablet TAKE 1 TABLET(160 MG) BY MOUTH DAILY (Patient not taking: Reported on 03/15/2024)   No facility-administered encounter medications on file as of 04/04/2024.     Lab Results  Component Value Date   WBC 5.1 03/15/2024   HGB 9.9 (L) 03/15/2024   HCT 30.5 (L) 03/15/2024   PLT 200 03/15/2024   GLUCOSE 85 03/15/2024   CHOL 226 (H) 11/30/2023   TRIG 68.0 11/30/2023   HDL 80.20 11/30/2023   LDLCALC 132 (H) 11/30/2023   ALT 14 03/15/2024   AST 24 03/15/2024   NA 141 03/15/2024   K 3.6 03/15/2024   CL 108 03/15/2024   CREATININE 1.03 (H) 03/15/2024   BUN 21 03/15/2024   CO2 24 03/15/2024   TSH 1.47 11/30/2023   HGBA1C 5.8 11/30/2023    NM PET Image Restage (PS) Whole Body Result Date: 04/20/2023 CLINICAL DATA:  Subsequent treatment strategy for multiple myeloma. EXAM: NUCLEAR MEDICINE PET WHOLE BODY TECHNIQUE: 7.9 mCi F-18 FDG was injected intravenously. Full-ring PET imaging was performed from the head to foot after the radiotracer. CT data was obtained and used for attenuation correction and anatomic localization. Fasting blood glucose: 80 mg/dl COMPARISON:  98/68/7976. FINDINGS: Mediastinal blood pool activity: SUV max 3.2 HEAD/NECK: Right level II/III lymph node measures 5 mm (6/47), SUV max 4.9. No additional abnormal hypermetabolism. Incidental CT findings: None. CHEST: No abnormal hypermetabolism.  Incidental CT findings: Thyroid  nodule in the isthmus and left lobe measures 2.6 x 3.6 cm. Atherosclerotic calcification of the aorta, aortic valve and coronary arteries. Heart is enlarged. No pericardial or pleural effusion. ABDOMEN/PELVIS: No abnormal hypermetabolism. Incidental CT findings: Liver, gallbladder, adrenal glands, kidneys, spleen, pancreas, stomach and bowel are grossly unremarkable. SKELETON: Patchy hypermetabolism throughout the spine. Focal hypermetabolism in the lateral aspect the right third rib, SUV max 3.6. Focal hypermetabolism in the left sacrum, SUV max 3.2. Incidental CT findings: Degenerative changes in the spine. EXTREMITIES: Possible degenerative uptake in  the left foot. Otherwise, no abnormal hypermetabolism. Incidental CT findings: None. IMPRESSION: 1. Patchy hypermetabolism throughout the spine with focal hypermetabolism in the right third rib and left sacrum, compatible with residual metabolically active multiple myeloma. 2. Hypermetabolic 5 mm right cervical lymph node, indeterminate. Recommend attention on follow-up. 3. 3.6 cm thyroid  nodule, previously evaluated by ultrasound 08/27/2022 and biopsy 10/07/2022. Please refer to pathology report for further evaluation. No follow-up recommended unless clinically warranted. (Ref: J Am Coll Radiol. 2015 Feb;12(2): 143-50). 4. Aortic atherosclerosis (ICD10-I70.0). Coronary artery calcification. Electronically Signed   By: Newell Eke M.D.   On: 04/20/2023 10:24       Assessment & Plan:  Multinodular goiter  Pure hypercholesterolemia  Stage 3a chronic kidney disease (HCC)  Hyperglycemia  Immunization due     Allena Hamilton, MD

## 2024-04-05 ENCOUNTER — Ambulatory Visit: Payer: Self-pay | Admitting: Internal Medicine

## 2024-04-10 ENCOUNTER — Encounter: Payer: Self-pay | Admitting: Internal Medicine

## 2024-04-10 NOTE — Assessment & Plan Note (Signed)
 Follow met b and A1c.

## 2024-04-10 NOTE — Assessment & Plan Note (Signed)
 Being followed by hematology. Hgb has been stable.

## 2024-04-10 NOTE — Assessment & Plan Note (Signed)
Avoid antiinflammatories.  Stay hydrated.  Follow metabolic panel.   

## 2024-04-10 NOTE — Assessment & Plan Note (Signed)
 Continues aricept  and namenda. Continues f/u with neurology. Overall stable.

## 2024-04-10 NOTE — Assessment & Plan Note (Signed)
 Thyroid  ultrasound - reveals changes that appear to be consistent with multinodular goiter. Saw endocrinology - recommended FNA. Molecular testing of the thyroid  - no evidence of cancer. Recommended methimazole . 07/01/23 - f/u MTN/subclinical hyperthyroid - continue methimazole . Planning for f/u thyroid  ultrasound next visit.

## 2024-04-10 NOTE — Assessment & Plan Note (Signed)
 Continue tricor . Follow lipid panel.

## 2024-04-10 NOTE — Assessment & Plan Note (Signed)
 Being followed by hematology for treatment - multiple myeloma. Treated with Daratumumab  and revilimid. S/p bone marro biopsy - 3% plasma cell, no M protein.  In CR. Continues aspirin daily.  Continues - Daratumumab  monotherapy maintenance. Remains on aspirin and acyclovir . Also continues on zometa  - q 3 months. Stable.

## 2024-04-10 NOTE — Assessment & Plan Note (Signed)
 Blood pressure as outlined.  Continue amlodipine , metoprolol , hydrochlorothiazide .  Follow pressures.  Follow metabolic panel.

## 2024-04-12 ENCOUNTER — Inpatient Hospital Stay: Attending: Oncology

## 2024-04-12 ENCOUNTER — Inpatient Hospital Stay (HOSPITAL_BASED_OUTPATIENT_CLINIC_OR_DEPARTMENT_OTHER): Admitting: Oncology

## 2024-04-12 ENCOUNTER — Inpatient Hospital Stay

## 2024-04-12 ENCOUNTER — Encounter: Payer: Self-pay | Admitting: Oncology

## 2024-04-12 VITALS — BP 153/74 | HR 63 | Temp 97.9°F | Resp 18 | Wt 155.8 lb

## 2024-04-12 DIAGNOSIS — I700 Atherosclerosis of aorta: Secondary | ICD-10-CM | POA: Insufficient documentation

## 2024-04-12 DIAGNOSIS — Z79624 Long term (current) use of inhibitors of nucleotide synthesis: Secondary | ICD-10-CM | POA: Insufficient documentation

## 2024-04-12 DIAGNOSIS — I10 Essential (primary) hypertension: Secondary | ICD-10-CM | POA: Diagnosis not present

## 2024-04-12 DIAGNOSIS — C9000 Multiple myeloma not having achieved remission: Secondary | ICD-10-CM | POA: Insufficient documentation

## 2024-04-12 DIAGNOSIS — Z9089 Acquired absence of other organs: Secondary | ICD-10-CM | POA: Insufficient documentation

## 2024-04-12 DIAGNOSIS — R5383 Other fatigue: Secondary | ICD-10-CM | POA: Diagnosis not present

## 2024-04-12 DIAGNOSIS — Z8249 Family history of ischemic heart disease and other diseases of the circulatory system: Secondary | ICD-10-CM | POA: Diagnosis not present

## 2024-04-12 DIAGNOSIS — I2510 Atherosclerotic heart disease of native coronary artery without angina pectoris: Secondary | ICD-10-CM | POA: Diagnosis not present

## 2024-04-12 DIAGNOSIS — Z79899 Other long term (current) drug therapy: Secondary | ICD-10-CM | POA: Diagnosis not present

## 2024-04-12 DIAGNOSIS — Z823 Family history of stroke: Secondary | ICD-10-CM | POA: Diagnosis not present

## 2024-04-12 DIAGNOSIS — Z79620 Long term (current) use of immunosuppressive biologic: Secondary | ICD-10-CM | POA: Insufficient documentation

## 2024-04-12 DIAGNOSIS — C9001 Multiple myeloma in remission: Secondary | ICD-10-CM | POA: Diagnosis not present

## 2024-04-12 DIAGNOSIS — Z5112 Encounter for antineoplastic immunotherapy: Secondary | ICD-10-CM | POA: Insufficient documentation

## 2024-04-12 DIAGNOSIS — Z803 Family history of malignant neoplasm of breast: Secondary | ICD-10-CM | POA: Insufficient documentation

## 2024-04-12 DIAGNOSIS — E7800 Pure hypercholesterolemia, unspecified: Secondary | ICD-10-CM | POA: Diagnosis not present

## 2024-04-12 DIAGNOSIS — Z7982 Long term (current) use of aspirin: Secondary | ICD-10-CM | POA: Diagnosis not present

## 2024-04-12 DIAGNOSIS — Z8261 Family history of arthritis: Secondary | ICD-10-CM | POA: Diagnosis not present

## 2024-04-12 DIAGNOSIS — M549 Dorsalgia, unspecified: Secondary | ICD-10-CM | POA: Diagnosis not present

## 2024-04-12 LAB — COMPREHENSIVE METABOLIC PANEL WITH GFR
ALT: 14 U/L (ref 0–44)
AST: 26 U/L (ref 15–41)
Albumin: 3.8 g/dL (ref 3.5–5.0)
Alkaline Phosphatase: 47 U/L (ref 38–126)
Anion gap: 8 (ref 5–15)
BUN: 22 mg/dL (ref 8–23)
CO2: 25 mmol/L (ref 22–32)
Calcium: 9.5 mg/dL (ref 8.9–10.3)
Chloride: 107 mmol/L (ref 98–111)
Creatinine, Ser: 1.19 mg/dL — ABNORMAL HIGH (ref 0.44–1.00)
GFR, Estimated: 44 mL/min — ABNORMAL LOW (ref >60)
Glucose, Bld: 97 mg/dL (ref 70–99)
Potassium: 3.7 mmol/L (ref 3.5–5.1)
Sodium: 140 mmol/L (ref 135–145)
Total Bilirubin: 0.5 mg/dL (ref 0.0–1.2)
Total Protein: 6.7 g/dL (ref 6.5–8.1)

## 2024-04-12 LAB — CBC WITH DIFFERENTIAL/PLATELET
Abs Immature Granulocytes: 0.02 K/uL (ref 0.00–0.07)
Basophils Absolute: 0 K/uL (ref 0.0–0.1)
Basophils Relative: 1 %
Eosinophils Absolute: 0.1 K/uL (ref 0.0–0.5)
Eosinophils Relative: 1 %
HCT: 33.1 % — ABNORMAL LOW (ref 36.0–46.0)
Hemoglobin: 10.8 g/dL — ABNORMAL LOW (ref 12.0–15.0)
Immature Granulocytes: 0 %
Lymphocytes Relative: 19 %
Lymphs Abs: 1.2 K/uL (ref 0.7–4.0)
MCH: 31.8 pg (ref 26.0–34.0)
MCHC: 32.6 g/dL (ref 30.0–36.0)
MCV: 97.4 fL (ref 80.0–100.0)
Monocytes Absolute: 0.2 K/uL (ref 0.1–1.0)
Monocytes Relative: 3 %
Neutro Abs: 4.8 K/uL (ref 1.7–7.7)
Neutrophils Relative %: 76 %
Platelets: 231 K/uL (ref 150–400)
RBC: 3.4 MIL/uL — ABNORMAL LOW (ref 3.87–5.11)
RDW: 12.8 % (ref 11.5–15.5)
WBC: 6.3 K/uL (ref 4.0–10.5)
nRBC: 0 % (ref 0.0–0.2)

## 2024-04-12 MED ORDER — DIPHENHYDRAMINE HCL 25 MG PO CAPS: 25.0000 mg | ORAL_CAPSULE | Freq: Once | ORAL | Status: DC

## 2024-04-12 MED ORDER — ACETAMINOPHEN 325 MG PO TABS: 650.0000 mg | ORAL_TABLET | Freq: Once | ORAL | Status: DC

## 2024-04-12 MED ORDER — DARATUMUMAB-HYALURONIDASE-FIHJ 1800-30000 MG-UT/15ML ~~LOC~~ SOLN
1800.0000 mg | Freq: Once | SUBCUTANEOUS | Status: AC
  Administered 2024-04-12: 1800 mg via SUBCUTANEOUS
  Filled 2024-04-12: qty 15

## 2024-04-12 NOTE — Progress Notes (Signed)
 Hematology/Oncology Progress note [phone] [phone]     CHIEF COMPLAINTS/REASON FOR VISIT:  Follow-up for multiple myeloma  ASSESSMENT & PLAN:   Cancer Staging  Multiple myeloma (HCC) Staging form: Plasma Cell Myeloma and Plasma Cell Disorders, AJCC 8th Edition - Clinical stage from 08/22/2021: RISS Stage II (Beta-2 -microglobulin (mg/L): 4.1, Albumin (g/dL): 2.8, ISS: Stage II, High-risk cytogenetics: Absent, LDH: Normal) - Signed by Babara Call, MD on 09/06/2021   Multiple myeloma (HCC) Multiple myeloma, IgA kappa, complex hyperploid gain of 1q, S/p Daratumumab  Rd.   Revlimid  D1-21,  07/23/22 bone marrow biopsy  3% plasma cell, no M protein.   Labs are reviewed and discussed with patient.  Lab Results  Component Value Date   MPROTEIN Not Observed 03/15/2024   KPAFRELGTCHN 8.7 03/15/2024   LAMBDASER 4.3 (L) 03/15/2024   KAPLAMBRATIO 2.02 (H) 03/15/2024     M protein is stable. Stable light chain ratio.  Proceed with  Daratumumab  monotherapy maintenance - she takes home supply of Dexamethasone  prior to her Daratumumab .  On asprin 81mg  daily and Acyclovir  400mg  BID.   Orders Placed This Encounter  Procedures   Kappa/lambda light chains    Standing Status:   Future    Expected Date:   06/07/2024    Expiration Date:   06/07/2025   Multiple Myeloma Panel (SPEP&IFE w/QIG)    Standing Status:   Future    Expected Date:   06/07/2024    Expiration Date:   06/07/2025   Comprehensive metabolic panel    Standing Status:   Future    Expected Date:   06/07/2024    Expiration Date:   06/07/2025   CBC with Differential    Standing Status:   Future    Expected Date:   06/07/2024    Expiration Date:   06/07/2025   Kappa/lambda light chains    Standing Status:   Future    Expected Date:   05/10/2024    Expiration Date:   05/10/2025   Multiple Myeloma Panel (SPEP&IFE w/QIG)    Standing Status:   Future    Expected Date:   05/10/2024    Expiration Date:    05/10/2025   Comprehensive metabolic panel    Standing Status:   Future    Expected Date:   05/10/2024    Expiration Date:   05/10/2025   CBC with Differential    Standing Status:   Future    Expected Date:   05/10/2024    Expiration Date:   05/10/2025     Follow up  Lab/MD/Dara in 4 weeks  All questions were answered. The patient knows to call the clinic with any problems, questions or concerns.  Call Babara, MD, PhD [facility] Hematology Oncology 04/12/2024     HISTORY OF PRESENTING ILLNESS:   Stephanie Delacruz is a  89 y.o.  female presents for follow up of multiple myeloma.  Oncology History  Multiple myeloma (HCC)  08/22/2021 Initial Diagnosis   Multiple myeloma (HCC) IgA kappa Initial M protein 5.2, free kappa light chain ratio 89.2, Kappa/lambda ratio 13.31   08/22/2021 Cancer Staging   Staging form: Plasma Cell Myeloma and Plasma Cell Disorders, AJCC 8th Edition - Clinical stage from 08/22/2021: RISS Stage II (Beta-2 -microglobulin (mg/L): 4.1, Albumin (g/dL): 2.8, ISS: Stage II, High-risk cytogenetics: Absent, LDH: Normal) - Signed by Babara Call, MD on 09/06/2021 Stage prefix: Initial diagnosis Beta 2 microglobulin range (mg/L): 3.5 to 5.49 Albumin range (g/dL): Less than 3.5 Cytogenetics: 1q addition   08/26/2021 -  09/01/2022 Chemotherapy   08/26/21 MYELOMA Daratumumab  SQ q28d  09/06/2021 added Revlimid  10mg  x 14 days.  09/27/2021 starting cycle 2 Revlimid  10 mg 21 days on, 7 days off. Weekly Dexamethasone .    08/26/2021 Bone Marrow Biopsy   Bone marrow biopsy showed hypercellular marrow with plasma cell neoplasm. 67% plasma cells in the aspirate. Cytogenetics showed complex hyperplasia, gain of 1 q.    07/23/2022 Bone Marrow Biopsy   Bone marrow biopsy showed Hypercellular bone marrow for age with trilineage hematopoiesis and 3% plasma cells  Cytogenetics are normal.   04/20/2023 Imaging   PET scan showed  1. Patchy hypermetabolism throughout the spine with focal  hypermetabolism in the right third rib and left sacrum, compatible with residual metabolically active multiple myeloma. 2. Hypermetabolic 5 mm right cervical lymph node, indeterminate.Recommend attention on follow-up. 3. 3.6 cm thyroid  nodule, previously evaluated by ultrasound 08/27/2022 and biopsy 10/07/2022. Please refer to pathology report for further evaluation. No follow-up recommended unless clinically warranted. (Ref: J Am Coll Radiol. 2015 Feb;12(2): 143-50). 4. Aortic atherosclerosis (ICD10-I70.0). Coronary artery calcification.    S/p thyroid  lesion biopsy, obtained by endocrinologist. + AUS  INTERVAL HISTORY Eshaal L Bey is a 89 y.o. female who has above history reviewed by me today presents for follow up visit for management of multiple myeloma She was accompanied by her daughter today.  She denies any nausea vomiting diarrhea.   No new complaints.   Review of Systems  Constitutional:  Positive for fatigue. Negative for appetite change, chills and fever.  HENT:   Negative for hearing loss and voice change.   Eyes:  Negative for eye problems.  Respiratory:  Negative for chest tightness and cough.   Cardiovascular:  Negative for chest pain.  Gastrointestinal:  Negative for abdominal distention, abdominal pain and blood in stool.  Endocrine: Negative for hot flashes.  Genitourinary:  Negative for difficulty urinating and frequency.   Musculoskeletal:  Positive for back pain. Negative for arthralgias.  Skin:  Negative for itching and rash.  Neurological:  Negative for extremity weakness.  Hematological:  Negative for adenopathy.  Psychiatric/Behavioral:  Negative for confusion.     MEDICAL HISTORY:  Past Medical History:  Diagnosis Date   Anemia    GERD (gastroesophageal reflux disease)    Hypercholesterolemia    Hypertension    Renal insufficiency     SURGICAL HISTORY: Past Surgical History:  Procedure Laterality Date   TONSILLECTOMY     TUBAL LIGATION       SOCIAL HISTORY: Social History   Socioeconomic History   Marital status: Married    Spouse name: Not on file   Number of children: Not on file   Years of education: Not on file   Highest education level: Not on file  Occupational History   Not on file  Tobacco Use   Smoking status: Never   Smokeless tobacco: Never  Vaping Use   Vaping status: Never Used  Substance and Sexual Activity   Alcohol use: No   Drug use: No   Sexual activity: Not on file  Other Topics Concern   Not on file  Social History Narrative   Lives with husband Lynwood and daughter Jon. No pets.    Social Drivers of Corporate investment banker Strain: Not on file  Food Insecurity: No Food Insecurity (01/11/2024)   Hunger Vital Sign    Worried About Running Out of Food in the Last Year: Never true    Ran Out of Food in the  Last Year: Never true  Transportation Needs: No Transportation Needs (01/11/2024)   PRAPARE - Administrator, Civil Service (Medical): No    Lack of Transportation (Non-Medical): No  Physical Activity: Not on file  Stress: Not on file  Social Connections: Not on file  Intimate Partner Violence: Not At Risk (01/11/2024)   Humiliation, Afraid, Rape, and Kick questionnaire    Fear of Current or Ex-Partner: No    Emotionally Abused: No    Physically Abused: No    Sexually Abused: No    FAMILY HISTORY: Family History  Problem Relation Age of Onset   Hypertension Mother    Arthritis Mother    Hypertension Father    Stroke Father    Breast cancer Niece     ALLERGIES:  has no known allergies.  MEDICATIONS:  Current Outpatient Medications  Medication Sig Dispense Refill   acyclovir  (ZOVIRAX ) 400 MG tablet Take 1 tablet (400 mg total) by mouth 2 (two) times daily. 60 tablet 11   amLODipine  (NORVASC ) 2.5 MG tablet Take 1 tablet (2.5 mg total) by mouth daily.     ascorbic acid (VITAMIN C) 250 MG tablet daily Take 2 tablets every day by oral route.     aspirin 81 MG  EC tablet Take 81 mg by mouth daily.     Calcium Carb-Cholecalciferol (CALCIUM 600+D) 600-20 MG-MCG TABS Take 1 tablet by mouth 3 (three) times daily.     dexamethasone  (DECADRON ) 4 MG tablet Take 5 tablets (20 mg total) by mouth See admin instructions. Take 20mg  prior to each Daratumumab  treatment. 30 tablet 1   donepezil  (ARICEPT ) 5 MG tablet Take 10 mg by mouth daily.     fenofibrate  (TRICOR ) 48 MG tablet TAKE 1 TABLET(48 MG) BY MOUTH DAILY 90 tablet 2   fexofenadine (ALLEGRA) 180 MG tablet Take by mouth.     hydrochlorothiazide  (HYDRODIURIL ) 25 MG tablet TAKE 1/2 TABLET BY MOUTH EVERY DAY 45 tablet 3   memantine (NAMENDA) 5 MG tablet Take 5 mg by mouth 2 (two) times daily.     methimazole  (TAPAZOLE ) 5 MG tablet Take 1 tablet (5 mg total) by mouth daily. 90 tablet 3   metoprolol  succinate (TOPROL -XL) 25 MG 24 hr tablet Take 1 tablet (25 mg total) by mouth daily. 90 tablet 3   montelukast  (SINGULAIR ) 10 MG tablet Take 1 tablet (10 mg total) by mouth See admin instructions. Take 1 tablet one day before Daratumumab  injection. 60 tablet 0   Multiple Vitamins-Minerals (WOMENS MULTIVITAMIN PO) Take 1 tablet by mouth daily.     omeprazole  (PRILOSEC) 20 MG capsule TAKE 1 CAPSULE(20 MG) BY MOUTH DAILY 90 capsule 3   prochlorperazine  (COMPAZINE ) 10 MG tablet Take 10 mg by mouth every 6 (six) hours as needed for nausea or vomiting.     valsartan  (DIOVAN ) 160 MG tablet TAKE 1 TABLET(160 MG) BY MOUTH DAILY (Patient not taking: Reported on 04/12/2024) 90 tablet 1   No current facility-administered medications for this visit.     PHYSICAL EXAMINATION: ECOG PERFORMANCE STATUS: 1 - Symptomatic but completely ambulatory Vitals:   04/12/24 1028 04/12/24 1033  BP: (!) 162/70 (!) 153/74  Pulse: 63   Resp: 18   Temp: 97.9 F (36.6 C)   SpO2: 100%    Filed Weights   04/12/24 1028  Weight: 155 lb 12.8 oz (70.7 kg)    Physical Exam Constitutional:      General: She is not in acute distress. HENT:      Head:  Normocephalic and atraumatic.  Eyes:     General: No scleral icterus. Cardiovascular:     Rate and Rhythm: Normal rate and regular rhythm.     Heart sounds: Normal heart sounds.  Pulmonary:     Effort: Pulmonary effort is normal. No respiratory distress.     Breath sounds: No wheezing.  Abdominal:     General: Bowel sounds are normal. There is no distension.     Palpations: Abdomen is soft.  Musculoskeletal:        General: No deformity. Normal range of motion.     Cervical back: Normal range of motion and neck supple.  Skin:    General: Skin is warm and dry.     Findings: No erythema or rash.  Neurological:     Mental Status: She is alert and oriented to person, place, and time. Mental status is at baseline.     Cranial Nerves: No cranial nerve deficit.     Coordination: Coordination normal.  Psychiatric:        Mood and Affect: Mood normal.     LABORATORY DATA:  I have reviewed the data as listed    Latest Ref Rng & Units 04/12/2024   10:12 AM 03/15/2024    9:03 AM 02/16/2024    8:03 AM  CBC  WBC 4.0 - 10.5 K/uL 6.3  5.1  5.2   Hemoglobin 12.0 - 15.0 g/dL 89.1  9.9  9.8   Hematocrit 36.0 - 46.0 % 33.1  30.5  30.3   Platelets 150 - 400 K/uL 231  200  226       Latest Ref Rng & Units 04/12/2024   10:12 AM 04/04/2024   10:48 AM 03/15/2024    9:03 AM  CMP  Glucose 70 - 99 mg/dL 97  86  85   BUN 8 - 23 mg/dL 22  17  21    Creatinine 0.44 - 1.00 mg/dL 8.80  8.93  8.96   Sodium 135 - 145 mmol/L 140  141  141   Potassium 3.5 - 5.1 mmol/L 3.7  4.3  3.6   Chloride 98 - 111 mmol/L 107  106  108   CO2 22 - 32 mmol/L 25  27  24    Calcium 8.9 - 10.3 mg/dL 9.5  9.9  8.9   Total Protein 6.5 - 8.1 g/dL 6.7  6.4  6.3   Total Bilirubin 0.0 - 1.2 mg/dL 0.5  0.4  0.4   Alkaline Phos 38 - 126 U/L 47  43  49   AST 15 - 41 U/L 26  21  24    ALT 0 - 44 U/L 14  13  14       Iron/TIBC/Ferritin/ %Sat    Component Value Date/Time   IRON 67 03/15/2024 0903   TIBC 427 03/15/2024  0903   FERRITIN 52 03/15/2024 0903   IRONPCTSAT 16 03/15/2024 0903       RADIOGRAPHIC STUDIES: I have personally reviewed the radiological images as listed and agreed with the findings in the report. No results found.

## 2024-04-12 NOTE — Assessment & Plan Note (Signed)
 Multiple myeloma, IgA kappa, complex hyperploid gain of 1q, S/p Daratumumab  Rd.   Revlimid  D1-21,  07/23/22 bone marrow biopsy  3% plasma cell, no M protein.   Labs are reviewed and discussed with patient.  Lab Results  Component Value Date   MPROTEIN Not Observed 03/15/2024   KPAFRELGTCHN 8.7 03/15/2024   LAMBDASER 4.3 (L) 03/15/2024   KAPLAMBRATIO 2.02 (H) 03/15/2024     M protein is stable. Stable light chain ratio.  Proceed with  Daratumumab  monotherapy maintenance - she takes home supply of Dexamethasone  prior to her Daratumumab .  On asprin 81mg  daily and Acyclovir  400mg  BID.

## 2024-04-13 LAB — KAPPA/LAMBDA LIGHT CHAINS
Kappa free light chain: 10.7 mg/L (ref 3.3–19.4)
Kappa, lambda light chain ratio: 2.38 — ABNORMAL HIGH (ref 0.26–1.65)
Lambda free light chains: 4.5 mg/L — ABNORMAL LOW (ref 5.7–26.3)

## 2024-04-14 LAB — MULTIPLE MYELOMA PANEL, SERUM
Albumin SerPl Elph-Mcnc: 3.4 g/dL (ref 2.9–4.4)
Albumin/Glob SerPl: 1.4 (ref 0.7–1.7)
Alpha 1: 0.2 g/dL (ref 0.0–0.4)
Alpha2 Glob SerPl Elph-Mcnc: 0.7 g/dL (ref 0.4–1.0)
B-Globulin SerPl Elph-Mcnc: 1.2 g/dL (ref 0.7–1.3)
Gamma Glob SerPl Elph-Mcnc: 0.4 g/dL (ref 0.4–1.8)
Globulin, Total: 2.6 g/dL (ref 2.2–3.9)
IgA: 201 mg/dL (ref 64–422)
IgG (Immunoglobin G), Serum: 561 mg/dL — ABNORMAL LOW (ref 586–1602)
IgM (Immunoglobulin M), Srm: 23 mg/dL — ABNORMAL LOW (ref 26–217)
Total Protein ELP: 6 g/dL (ref 6.0–8.5)

## 2024-05-10 ENCOUNTER — Inpatient Hospital Stay (HOSPITAL_BASED_OUTPATIENT_CLINIC_OR_DEPARTMENT_OTHER): Admitting: Oncology

## 2024-05-10 ENCOUNTER — Encounter: Payer: Self-pay | Admitting: Oncology

## 2024-05-10 ENCOUNTER — Inpatient Hospital Stay: Attending: Oncology

## 2024-05-10 ENCOUNTER — Inpatient Hospital Stay

## 2024-05-10 VITALS — BP 141/64 | HR 58

## 2024-05-10 VITALS — BP 155/69 | HR 65 | Temp 97.6°F | Resp 18 | Wt 155.7 lb

## 2024-05-10 DIAGNOSIS — T451X5A Adverse effect of antineoplastic and immunosuppressive drugs, initial encounter: Secondary | ICD-10-CM | POA: Insufficient documentation

## 2024-05-10 DIAGNOSIS — C9001 Multiple myeloma in remission: Secondary | ICD-10-CM | POA: Diagnosis not present

## 2024-05-10 DIAGNOSIS — Z7982 Long term (current) use of aspirin: Secondary | ICD-10-CM | POA: Diagnosis not present

## 2024-05-10 DIAGNOSIS — E7800 Pure hypercholesterolemia, unspecified: Secondary | ICD-10-CM | POA: Insufficient documentation

## 2024-05-10 DIAGNOSIS — R5383 Other fatigue: Secondary | ICD-10-CM | POA: Insufficient documentation

## 2024-05-10 DIAGNOSIS — Z5112 Encounter for antineoplastic immunotherapy: Secondary | ICD-10-CM | POA: Insufficient documentation

## 2024-05-10 DIAGNOSIS — Z823 Family history of stroke: Secondary | ICD-10-CM | POA: Insufficient documentation

## 2024-05-10 DIAGNOSIS — I2510 Atherosclerotic heart disease of native coronary artery without angina pectoris: Secondary | ICD-10-CM | POA: Insufficient documentation

## 2024-05-10 DIAGNOSIS — Z9089 Acquired absence of other organs: Secondary | ICD-10-CM | POA: Diagnosis not present

## 2024-05-10 DIAGNOSIS — D6481 Anemia due to antineoplastic chemotherapy: Secondary | ICD-10-CM | POA: Diagnosis not present

## 2024-05-10 DIAGNOSIS — C9000 Multiple myeloma not having achieved remission: Secondary | ICD-10-CM | POA: Diagnosis not present

## 2024-05-10 DIAGNOSIS — Z5111 Encounter for antineoplastic chemotherapy: Secondary | ICD-10-CM

## 2024-05-10 DIAGNOSIS — N1831 Chronic kidney disease, stage 3a: Secondary | ICD-10-CM | POA: Diagnosis not present

## 2024-05-10 DIAGNOSIS — I10 Essential (primary) hypertension: Secondary | ICD-10-CM | POA: Diagnosis not present

## 2024-05-10 DIAGNOSIS — D631 Anemia in chronic kidney disease: Secondary | ICD-10-CM | POA: Diagnosis not present

## 2024-05-10 DIAGNOSIS — Z79620 Long term (current) use of immunosuppressive biologic: Secondary | ICD-10-CM | POA: Diagnosis not present

## 2024-05-10 DIAGNOSIS — Z79899 Other long term (current) drug therapy: Secondary | ICD-10-CM | POA: Insufficient documentation

## 2024-05-10 DIAGNOSIS — Z8249 Family history of ischemic heart disease and other diseases of the circulatory system: Secondary | ICD-10-CM | POA: Insufficient documentation

## 2024-05-10 DIAGNOSIS — D649 Anemia, unspecified: Secondary | ICD-10-CM

## 2024-05-10 DIAGNOSIS — Z8261 Family history of arthritis: Secondary | ICD-10-CM | POA: Insufficient documentation

## 2024-05-10 DIAGNOSIS — I700 Atherosclerosis of aorta: Secondary | ICD-10-CM | POA: Insufficient documentation

## 2024-05-10 DIAGNOSIS — Z803 Family history of malignant neoplasm of breast: Secondary | ICD-10-CM | POA: Insufficient documentation

## 2024-05-10 DIAGNOSIS — M899 Disorder of bone, unspecified: Secondary | ICD-10-CM

## 2024-05-10 DIAGNOSIS — M549 Dorsalgia, unspecified: Secondary | ICD-10-CM | POA: Insufficient documentation

## 2024-05-10 LAB — COMPREHENSIVE METABOLIC PANEL WITH GFR
ALT: 16 U/L (ref 0–44)
AST: 28 U/L (ref 15–41)
Albumin: 3.8 g/dL (ref 3.5–5.0)
Alkaline Phosphatase: 44 U/L (ref 38–126)
Anion gap: 10 (ref 5–15)
BUN: 22 mg/dL (ref 8–23)
CO2: 24 mmol/L (ref 22–32)
Calcium: 9.6 mg/dL (ref 8.9–10.3)
Chloride: 107 mmol/L (ref 98–111)
Creatinine, Ser: 1.16 mg/dL — ABNORMAL HIGH (ref 0.44–1.00)
GFR, Estimated: 45 mL/min — ABNORMAL LOW (ref >60)
Glucose, Bld: 84 mg/dL (ref 70–99)
Potassium: 3.6 mmol/L (ref 3.5–5.1)
Sodium: 141 mmol/L (ref 135–145)
Total Bilirubin: 0.5 mg/dL (ref 0.0–1.2)
Total Protein: 6.9 g/dL (ref 6.5–8.1)

## 2024-05-10 LAB — CBC WITH DIFFERENTIAL/PLATELET
Abs Immature Granulocytes: 0.03 K/uL (ref 0.00–0.07)
Basophils Absolute: 0.1 K/uL (ref 0.0–0.1)
Basophils Relative: 1 %
Eosinophils Absolute: 0.1 K/uL (ref 0.0–0.5)
Eosinophils Relative: 3 %
HCT: 33.2 % — ABNORMAL LOW (ref 36.0–46.0)
Hemoglobin: 11 g/dL — ABNORMAL LOW (ref 12.0–15.0)
Immature Granulocytes: 1 %
Lymphocytes Relative: 30 %
Lymphs Abs: 1.7 K/uL (ref 0.7–4.0)
MCH: 32.1 pg (ref 26.0–34.0)
MCHC: 33.1 g/dL (ref 30.0–36.0)
MCV: 96.8 fL (ref 80.0–100.0)
Monocytes Absolute: 0.2 K/uL (ref 0.1–1.0)
Monocytes Relative: 4 %
Neutro Abs: 3.5 K/uL (ref 1.7–7.7)
Neutrophils Relative %: 61 %
Platelets: 220 K/uL (ref 150–400)
RBC: 3.43 MIL/uL — ABNORMAL LOW (ref 3.87–5.11)
RDW: 12.9 % (ref 11.5–15.5)
WBC: 5.7 K/uL (ref 4.0–10.5)
nRBC: 0 % (ref 0.0–0.2)

## 2024-05-10 MED ORDER — ACETAMINOPHEN 325 MG PO TABS: 650.0000 mg | ORAL_TABLET | Freq: Once | ORAL | Status: DC

## 2024-05-10 MED ORDER — ZOLEDRONIC ACID 4 MG/5ML IV CONC
3.0000 mg | Freq: Once | INTRAVENOUS | Status: AC
  Administered 2024-05-10: 3 mg via INTRAVENOUS
  Filled 2024-05-10: qty 3.75

## 2024-05-10 MED ORDER — DARATUMUMAB-HYALURONIDASE-FIHJ 1800-30000 MG-UT/15ML ~~LOC~~ SOLN
1800.0000 mg | Freq: Once | SUBCUTANEOUS | Status: AC
  Administered 2024-05-10: 1800 mg via SUBCUTANEOUS
  Filled 2024-05-10: qty 15

## 2024-05-10 MED ORDER — DIPHENHYDRAMINE HCL 25 MG PO CAPS: 25.0000 mg | ORAL_CAPSULE | Freq: Once | ORAL | Status: DC

## 2024-05-10 MED ORDER — SODIUM CHLORIDE 0.9 % IV SOLN
Freq: Once | INTRAVENOUS | Status: AC
  Filled 2024-05-10: qty 250

## 2024-05-10 NOTE — Assessment & Plan Note (Signed)
 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  Lab Results  Component Value Date   HGB 11.0 (L) 05/10/2024   TIBC 427 03/15/2024   IRONPCTSAT 16 03/15/2024   FERRITIN 52 03/15/2024

## 2024-05-10 NOTE — Assessment & Plan Note (Addendum)
 Multiple myeloma, IgA kappa, complex hyperploid gain of 1q, S/p Daratumumab  Rd.   Revlimid  D1-21,  07/23/22 bone marrow biopsy  3% plasma cell, no M protein.   Labs are reviewed and discussed with patient.  Lab Results  Component Value Date   MPROTEIN Comment (A) 04/12/2024   KPAFRELGTCHN 10.7 04/12/2024   LAMBDASER 4.5 (L) 04/12/2024   KAPLAMBRATIO 2.38 (H) 04/12/2024     M protein is stable. Stable light chain ratio.  Proceed with  Daratumumab  monotherapy maintenance - she takes home supply of Dexamethasone  prior to her Daratumumab .  On asprin 81mg  daily and Acyclovir  400mg  BID.

## 2024-05-10 NOTE — Assessment & Plan Note (Signed)
recommend patient to avoid nephrotoxin.  Encourage oral hydration. 

## 2024-05-10 NOTE — Assessment & Plan Note (Signed)
 04/20/23 PET scan whole body,  Patchy hypermetabolism throughout the spine with focal hypermetabolism in the right third rib and left sacrum, compatible with residual metabolically active multiple myeloma.  Continue Q3 months Zometa - today and next due Jan 2026  Recommend calcium 1200 mg daily.

## 2024-05-10 NOTE — Assessment & Plan Note (Signed)
Chemotherapy plan as listed as above. 

## 2024-05-10 NOTE — Progress Notes (Signed)
 Hematology/Oncology Progress note [phone] Fax:([phone]) [phone]     CHIEF COMPLAINTS/REASON FOR VISIT:  Follow-up for multiple myeloma  ASSESSMENT & PLAN:   Cancer Staging  Multiple myeloma (HCC) Staging form: Plasma Cell Myeloma and Plasma Cell Disorders, AJCC 8th Edition - Clinical stage from 08/22/2021: RISS Stage II (Beta-2 -microglobulin (mg/L): 4.1, Albumin (g/dL): 2.8, ISS: Stage II, High-risk cytogenetics: Absent, LDH: Normal) - Signed by Babara Call, MD on 09/06/2021   Multiple myeloma (HCC) Multiple myeloma, IgA kappa, complex hyperploid gain of 1q, S/p Daratumumab  Rd.   Revlimid  D1-21,  07/23/22 bone marrow biopsy  3% plasma cell, no M protein.   Labs are reviewed and discussed with patient.  Lab Results  Component Value Date   MPROTEIN Comment (A) 04/12/2024   KPAFRELGTCHN 10.7 04/12/2024   LAMBDASER 4.5 (L) 04/12/2024   KAPLAMBRATIO 2.38 (H) 04/12/2024     M protein is stable. Stable light chain ratio.  Proceed with  Daratumumab  monotherapy maintenance - she takes home supply of Dexamethasone  prior to her Daratumumab .  On asprin 81mg  daily and Acyclovir  400mg  BID.   Bone lesion 04/20/23 PET scan whole body,  Patchy hypermetabolism throughout the spine with focal hypermetabolism in the right third rib and left sacrum, compatible with residual metabolically active multiple myeloma.  Continue Q3 months Zometa - today and next due Jan 2026  Recommend calcium 1200 mg daily.  Encounter for antineoplastic chemotherapy Chemotherapy plan as listed as above.  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  Lab Results  Component Value Date   HGB 11.0 (L) 05/10/2024   TIBC 427 03/15/2024   IRONPCTSAT 16 03/15/2024   FERRITIN 52 03/15/2024       Stage 3a chronic kidney disease (HCC) recommend patient to avoid nephrotoxin.  Encourage oral hydration.  Orders Placed This Encounter  Procedures   Kappa/lambda light chains    Standing  Status:   Future    Expected Date:   07/05/2024    Expiration Date:   07/05/2025   Multiple Myeloma Panel (SPEP&IFE w/QIG)    Standing Status:   Future    Expected Date:   07/05/2024    Expiration Date:   07/05/2025   Comprehensive metabolic panel    Standing Status:   Future    Expected Date:   07/05/2024    Expiration Date:   07/05/2025   CBC with Differential    Standing Status:   Future    Expected Date:   07/05/2024    Expiration Date:   07/05/2025     Follow up  Lab/MD/Dara in 4 weeks  All questions were answered. The patient knows to call the clinic with any problems, questions or concerns.  Call Babara, MD, PhD [facility] Hematology Oncology 05/10/2024     HISTORY OF PRESENTING ILLNESS:   Stephanie Delacruz is a  89 y.o.  female presents for follow up of multiple myeloma.  Oncology History  Multiple myeloma (HCC)  08/22/2021 Initial Diagnosis   Multiple myeloma (HCC) IgA kappa Initial M protein 5.2, free kappa light chain ratio 89.2, Kappa/lambda ratio 13.31   08/22/2021 Cancer Staging   Staging form: Plasma Cell Myeloma and Plasma Cell Disorders, AJCC 8th Edition - Clinical stage from 08/22/2021: RISS Stage II (Beta-2 -microglobulin (mg/L): 4.1, Albumin (g/dL): 2.8, ISS: Stage II, High-risk cytogenetics: Absent, LDH: Normal) - Signed by Babara Call, MD on 09/06/2021 Stage prefix: Initial diagnosis Beta 2 microglobulin range (mg/L): 3.5 to 5.49 Albumin range (g/dL): Less than 3.5 Cytogenetics: 1q addition  08/26/2021 - 09/01/2022 Chemotherapy   08/26/21 MYELOMA Daratumumab  SQ q28d  09/06/2021 added Revlimid  10mg  x 14 days.  09/27/2021 starting cycle 2 Revlimid  10 mg 21 days on, 7 days off. Weekly Dexamethasone .    08/26/2021 Bone Marrow Biopsy   Bone marrow biopsy showed hypercellular marrow with plasma cell neoplasm. 67% plasma cells in the aspirate. Cytogenetics showed complex hyperplasia, gain of 1 q.    07/23/2022 Bone Marrow Biopsy   Bone marrow biopsy showed Hypercellular bone  marrow for age with trilineage hematopoiesis and 3% plasma cells  Cytogenetics are normal.   04/20/2023 Imaging   PET scan showed  1. Patchy hypermetabolism throughout the spine with focal hypermetabolism in the right third rib and left sacrum, compatible with residual metabolically active multiple myeloma. 2. Hypermetabolic 5 mm right cervical lymph node, indeterminate.Recommend attention on follow-up. 3. 3.6 cm thyroid  nodule, previously evaluated by ultrasound 08/27/2022 and biopsy 10/07/2022. Please refer to pathology report for further evaluation. No follow-up recommended unless clinically warranted. (Ref: J Am Coll Radiol. 2015 Feb;12(2): 143-50). 4. Aortic atherosclerosis (ICD10-I70.0). Coronary artery calcification.    S/p thyroid  lesion biopsy, obtained by endocrinologist. + AUS  INTERVAL HISTORY Stephanie Delacruz is a 89 y.o. female who has above history reviewed by me today presents for follow up visit for management of multiple myeloma She was accompanied by her daughter today.  She denies any nausea vomiting diarrhea.   No new complaints.   Review of Systems  Constitutional:  Positive for fatigue. Negative for appetite change, chills and fever.  HENT:   Negative for hearing loss and voice change.   Eyes:  Negative for eye problems.  Respiratory:  Negative for chest tightness and cough.   Cardiovascular:  Negative for chest pain.  Gastrointestinal:  Negative for abdominal distention, abdominal pain and blood in stool.  Endocrine: Negative for hot flashes.  Genitourinary:  Negative for difficulty urinating and frequency.   Musculoskeletal:  Positive for back pain. Negative for arthralgias.  Skin:  Negative for itching and rash.  Neurological:  Negative for extremity weakness.  Hematological:  Negative for adenopathy.  Psychiatric/Behavioral:  Negative for confusion.     MEDICAL HISTORY:  Past Medical History:  Diagnosis Date   Anemia    GERD (gastroesophageal reflux  disease)    Hypercholesterolemia    Hypertension    Renal insufficiency     SURGICAL HISTORY: Past Surgical History:  Procedure Laterality Date   TONSILLECTOMY     TUBAL LIGATION      SOCIAL HISTORY: Social History   Socioeconomic History   Marital status: Married    Spouse name: Not on file   Number of children: Not on file   Years of education: Not on file   Highest education level: Not on file  Occupational History   Not on file  Tobacco Use   Smoking status: Never   Smokeless tobacco: Never  Vaping Use   Vaping status: Never Used  Substance and Sexual Activity   Alcohol use: No   Drug use: No   Sexual activity: Not on file  Other Topics Concern   Not on file  Social History Narrative   Lives with husband Lynwood and daughter Jon. No pets.    Social Drivers of Corporate investment banker Strain: Not on file  Food Insecurity: No Food Insecurity (01/11/2024)   Hunger Vital Sign    Worried About Running Out of Food in the Last Year: Never true    Ran Out of Food  in the Last Year: Never true  Transportation Needs: No Transportation Needs (01/11/2024)   PRAPARE - Administrator, Civil Service (Medical): No    Lack of Transportation (Non-Medical): No  Physical Activity: Not on file  Stress: Not on file  Social Connections: Not on file  Intimate Partner Violence: Not At Risk (01/11/2024)   Humiliation, Afraid, Rape, and Kick questionnaire    Fear of Current or Ex-Partner: No    Emotionally Abused: No    Physically Abused: No    Sexually Abused: No    FAMILY HISTORY: Family History  Problem Relation Age of Onset   Hypertension Mother    Arthritis Mother    Hypertension Father    Stroke Father    Breast cancer Niece     ALLERGIES:  has no known allergies.  MEDICATIONS:  Current Outpatient Medications  Medication Sig Dispense Refill   acyclovir  (ZOVIRAX ) 400 MG tablet Take 1 tablet (400 mg total) by mouth 2 (two) times daily. 60 tablet 11    amLODipine  (NORVASC ) 2.5 MG tablet Take 1 tablet (2.5 mg total) by mouth daily.     ascorbic acid (VITAMIN C) 250 MG tablet daily Take 2 tablets every day by oral route.     aspirin 81 MG EC tablet Take 81 mg by mouth daily.     Calcium Carb-Cholecalciferol (CALCIUM 600+D) 600-20 MG-MCG TABS Take 1 tablet by mouth 3 (three) times daily.     dexamethasone  (DECADRON ) 4 MG tablet Take 5 tablets (20 mg total) by mouth See admin instructions. Take 20mg  prior to each Daratumumab  treatment. 30 tablet 1   donepezil  (ARICEPT ) 5 MG tablet Take 10 mg by mouth daily.     fenofibrate  (TRICOR ) 48 MG tablet TAKE 1 TABLET(48 MG) BY MOUTH DAILY 90 tablet 2   fexofenadine (ALLEGRA) 180 MG tablet Take by mouth.     hydrochlorothiazide  (HYDRODIURIL ) 25 MG tablet TAKE 1/2 TABLET BY MOUTH EVERY DAY 45 tablet 3   memantine (NAMENDA) 5 MG tablet Take 5 mg by mouth 2 (two) times daily.     methimazole  (TAPAZOLE ) 5 MG tablet Take 1 tablet (5 mg total) by mouth daily. 90 tablet 3   metoprolol  succinate (TOPROL -XL) 25 MG 24 hr tablet Take 1 tablet (25 mg total) by mouth daily. 90 tablet 3   montelukast  (SINGULAIR ) 10 MG tablet Take 1 tablet (10 mg total) by mouth See admin instructions. Take 1 tablet one day before Daratumumab  injection. 60 tablet 0   Multiple Vitamins-Minerals (WOMENS MULTIVITAMIN PO) Take 1 tablet by mouth daily.     omeprazole  (PRILOSEC) 20 MG capsule TAKE 1 CAPSULE(20 MG) BY MOUTH DAILY 90 capsule 3   prochlorperazine  (COMPAZINE ) 10 MG tablet Take 10 mg by mouth every 6 (six) hours as needed for nausea or vomiting.     valsartan  (DIOVAN ) 160 MG tablet TAKE 1 TABLET(160 MG) BY MOUTH DAILY (Patient not taking: Reported on 05/10/2024) 90 tablet 1   No current facility-administered medications for this visit.   Facility-Administered Medications Ordered in Other Visits  Medication Dose Route Frequency Provider Last Rate Last Admin   acetaminophen  (TYLENOL ) tablet 650 mg  650 mg Oral Once Babara Call, MD        diphenhydrAMINE  (BENADRYL ) capsule 25 mg  25 mg Oral Once Birgitta Uhlir, MD       zoledronic  acid (ZOMETA ) 3 mg in sodium chloride  0.9 % 100 mL IVPB  3 mg Intravenous Once Babara Call, MD 415 mL/hr at 05/10/24 1101 3  mg at 05/10/24 1101     PHYSICAL EXAMINATION: ECOG PERFORMANCE STATUS: 1 - Symptomatic but completely ambulatory Vitals:   05/10/24 0919 05/10/24 0925  BP: (!) 155/67 (!) 155/69  Pulse: 65   Resp: 18   Temp: 97.6 F (36.4 C)   SpO2: 100%    Filed Weights   05/10/24 0919  Weight: 155 lb 11.2 oz (70.6 kg)    Physical Exam Constitutional:      General: She is not in acute distress. HENT:     Head: Normocephalic and atraumatic.  Eyes:     General: No scleral icterus. Cardiovascular:     Rate and Rhythm: Normal rate and regular rhythm.     Heart sounds: Normal heart sounds.  Pulmonary:     Effort: Pulmonary effort is normal. No respiratory distress.     Breath sounds: No wheezing.  Abdominal:     General: Bowel sounds are normal. There is no distension.     Palpations: Abdomen is soft.  Musculoskeletal:        General: No deformity. Normal range of motion.     Cervical back: Normal range of motion and neck supple.  Skin:    General: Skin is warm and dry.     Findings: No erythema or rash.  Neurological:     Mental Status: She is alert and oriented to person, place, and time. Mental status is at baseline.     Cranial Nerves: No cranial nerve deficit.     Coordination: Coordination normal.  Psychiatric:        Mood and Affect: Mood normal.     LABORATORY DATA:  I have reviewed the data as listed    Latest Ref Rng & Units 05/10/2024    8:53 AM 04/12/2024   10:12 AM 03/15/2024    9:03 AM  CBC  WBC 4.0 - 10.5 K/uL 5.7  6.3  5.1   Hemoglobin 12.0 - 15.0 g/dL 88.9  89.1  9.9   Hematocrit 36.0 - 46.0 % 33.2  33.1  30.5   Platelets 150 - 400 K/uL 220  231  200       Latest Ref Rng & Units 05/10/2024    8:53 AM 04/12/2024   10:12 AM 04/04/2024   10:48 AM   CMP  Glucose 70 - 99 mg/dL 84  97  86   BUN 8 - 23 mg/dL 22  22  17    Creatinine 0.44 - 1.00 mg/dL 8.83  8.80  8.93   Sodium 135 - 145 mmol/L 141  140  141   Potassium 3.5 - 5.1 mmol/L 3.6  3.7  4.3   Chloride 98 - 111 mmol/L 107  107  106   CO2 22 - 32 mmol/L 24  25  27    Calcium 8.9 - 10.3 mg/dL 9.6  9.5  9.9   Total Protein 6.5 - 8.1 g/dL 6.9  6.7  6.4   Total Bilirubin 0.0 - 1.2 mg/dL 0.5  0.5  0.4   Alkaline Phos 38 - 126 U/L 44  47  43   AST 15 - 41 U/L 28  26  21    ALT 0 - 44 U/L 16  14  13       Iron/TIBC/Ferritin/ %Sat    Component Value Date/Time   IRON 67 03/15/2024 0903   TIBC 427 03/15/2024 0903   FERRITIN 52 03/15/2024 0903   IRONPCTSAT 16 03/15/2024 0903       RADIOGRAPHIC STUDIES: I have personally reviewed the  radiological images as listed and agreed with the findings in the report. No results found.

## 2024-05-11 LAB — KAPPA/LAMBDA LIGHT CHAINS
Kappa free light chain: 10.7 mg/L (ref 3.3–19.4)
Kappa, lambda light chain ratio: 1.78 — ABNORMAL HIGH (ref 0.26–1.65)
Lambda free light chains: 6 mg/L (ref 5.7–26.3)

## 2024-05-12 LAB — MULTIPLE MYELOMA PANEL, SERUM
Albumin SerPl Elph-Mcnc: 3.4 g/dL (ref 2.9–4.4)
Albumin/Glob SerPl: 1.2 (ref 0.7–1.7)
Alpha 1: 0.3 g/dL (ref 0.0–0.4)
Alpha2 Glob SerPl Elph-Mcnc: 0.8 g/dL (ref 0.4–1.0)
B-Globulin SerPl Elph-Mcnc: 1.3 g/dL (ref 0.7–1.3)
Gamma Glob SerPl Elph-Mcnc: 0.5 g/dL (ref 0.4–1.8)
Globulin, Total: 2.9 g/dL (ref 2.2–3.9)
IgA: 210 mg/dL (ref 64–422)
IgG (Immunoglobin G), Serum: 565 mg/dL — ABNORMAL LOW (ref 586–1602)
IgM (Immunoglobulin M), Srm: 27 mg/dL (ref 26–217)
Total Protein ELP: 6.3 g/dL (ref 6.0–8.5)

## 2024-05-18 ENCOUNTER — Encounter: Payer: Self-pay | Admitting: Oncology

## 2024-05-20 ENCOUNTER — Other Ambulatory Visit: Payer: Self-pay | Admitting: Internal Medicine

## 2024-05-27 ENCOUNTER — Encounter: Payer: Self-pay | Admitting: Cardiology

## 2024-05-27 ENCOUNTER — Ambulatory Visit: Attending: Cardiology | Admitting: Cardiology

## 2024-05-27 VITALS — BP 142/62 | HR 72 | Ht 64.0 in | Wt 158.0 lb

## 2024-05-27 DIAGNOSIS — R55 Syncope and collapse: Secondary | ICD-10-CM

## 2024-05-27 DIAGNOSIS — R001 Bradycardia, unspecified: Secondary | ICD-10-CM

## 2024-05-27 DIAGNOSIS — I10 Essential (primary) hypertension: Secondary | ICD-10-CM

## 2024-05-27 NOTE — Progress Notes (Signed)
 Cardiology Office Note:    Date:  05/27/2024   ID:  Stephanie Delacruz, DOB 20-Nov-1933, MRN [id number]  PCP:  Glendia Shad, MD   [facility] Providers Cardiologist:  Redell Cave, MD     Referring MD: Glendia Shad, MD   Chief Complaint  Patient presents with   Follow-up    6 month follow up pt has been doing well with no complaints of chest pain, chest pressure or SOB, medciation reviewed verbally with patient    History of Present Illness:    Stephanie Delacruz is a 89 y.o. female with a hx of hypertension, multiple myeloma, CKD, presenting for follow-up.   Patient feels well, denies any episodes of syncope.  Heart rates have been better ranging in the high 50s to 70s.  Metoprolol  was previously reduced due to episode of low BP, low heart rates and presyncopal symptoms.  Feels well, has no concerns at this time.  Systolic BP at home ranges in the 130s to 140s.  Prior notes/testing Cardiac monitor 06/2023 average heart rate 67, range 52-113.  No significant arrhythmias. Echo 06/2023 EF 60 to 65%  Past Medical History:  Diagnosis Date   Anemia    GERD (gastroesophageal reflux disease)    Hypercholesterolemia    Hypertension    Renal insufficiency     Past Surgical History:  Procedure Laterality Date   TONSILLECTOMY     TUBAL LIGATION      Current Medications: Current Meds  Medication Sig   acyclovir  (ZOVIRAX ) 400 MG tablet Take 1 tablet (400 mg total) by mouth 2 (two) times daily.   amLODipine  (NORVASC ) 2.5 MG tablet Take 1 tablet (2.5 mg total) by mouth daily.   ascorbic acid (VITAMIN C) 250 MG tablet daily Take 2 tablets every day by oral route.   aspirin 81 MG EC tablet Take 81 mg by mouth daily.   Calcium Carb-Cholecalciferol (CALCIUM 600+D) 600-20 MG-MCG TABS Take 1 tablet by mouth 3 (three) times daily.   dexamethasone  (DECADRON ) 4 MG tablet Take 5 tablets (20 mg total) by mouth See admin instructions. Take 20mg  prior to each Daratumumab  treatment.    donepezil  (ARICEPT ) 5 MG tablet Take 10 mg by mouth daily.   fenofibrate  (TRICOR ) 48 MG tablet TAKE 1 TABLET(48 MG) BY MOUTH DAILY   fexofenadine (ALLEGRA) 180 MG tablet Take by mouth.   hydrochlorothiazide  (HYDRODIURIL ) 25 MG tablet TAKE 1/2 TABLET BY MOUTH EVERY DAY   memantine (NAMENDA) 5 MG tablet Take 5 mg by mouth 2 (two) times daily.   methimazole  (TAPAZOLE ) 5 MG tablet Take 1 tablet (5 mg total) by mouth daily.   metoprolol  succinate (TOPROL -XL) 25 MG 24 hr tablet Take 1 tablet (25 mg total) by mouth daily.   montelukast  (SINGULAIR ) 10 MG tablet Take 1 tablet (10 mg total) by mouth See admin instructions. Take 1 tablet one day before Daratumumab  injection.   Multiple Vitamins-Minerals (WOMENS MULTIVITAMIN PO) Take 1 tablet by mouth daily.   omeprazole  (PRILOSEC) 20 MG capsule TAKE 1 CAPSULE(20 MG) BY MOUTH DAILY   prochlorperazine  (COMPAZINE ) 10 MG tablet Take 10 mg by mouth every 6 (six) hours as needed for nausea or vomiting.   valsartan  (DIOVAN ) 160 MG tablet TAKE 1 TABLET(160 MG) BY MOUTH DAILY     Allergies:   Patient has no known allergies.   Social History   Socioeconomic History   Marital status: Married    Spouse name: Not on file   Number of children: Not on file  Years of education: Not on file   Highest education level: Not on file  Occupational History   Not on file  Tobacco Use   Smoking status: Never   Smokeless tobacco: Never  Vaping Use   Vaping status: Never Used  Substance and Sexual Activity   Alcohol use: No   Drug use: No   Sexual activity: Not on file  Other Topics Concern   Not on file  Social History Narrative   Lives with husband Lynwood and daughter Jon. No pets.    Social Drivers of Corporate Investment Banker Strain: Not on file  Food Insecurity: No Food Insecurity (01/11/2024)   Hunger Vital Sign    Worried About Running Out of Food in the Last Year: Never true    Ran Out of Food in the Last Year: Never true  Transportation Needs:  No Transportation Needs (01/11/2024)   PRAPARE - Administrator, Civil Service (Medical): No    Lack of Transportation (Non-Medical): No  Physical Activity: Not on file  Stress: Not on file  Social Connections: Not on file     Family History: The patient's family history includes Arthritis in her mother; Breast cancer in her niece; Hypertension in her father and mother; Stroke in her father.  ROS:   Please see the history of present illness.     All other systems reviewed and are negative.  EKGs/Labs/Other Studies Reviewed:    The following studies were reviewed today:  EKG Interpretation Date/Time:  Friday May 27 2024 11:34:24 EDT Ventricular Rate:  72 PR Interval:  244 QRS Duration:  84 QT Interval:  386 QTC Calculation: 422 R Axis:   -15  Text Interpretation: Sinus rhythm with 1st degree A-V block Minimal voltage criteria for LVH, may be normal variant ( R in aVL ) Confirmed by Darliss Rogue ([id number]) on 05/27/2024 11:55:25 AM    Recent Labs: 04/04/2024: TSH 1.90 05/10/2024: ALT 16; BUN 22; Creatinine, Ser 1.16; Hemoglobin 11.0; Platelets 220; Potassium 3.6; Sodium 141  Recent Lipid Panel    Component Value Date/Time   CHOL 230 (H) 04/04/2024 1048   TRIG 64.0 04/04/2024 1048   HDL 88.90 04/04/2024 1048   CHOLHDL 3 04/04/2024 1048   VLDL 12.8 04/04/2024 1048   LDLCALC 128 (H) 04/04/2024 1048   LDLCALC 134 (H) 07/31/2023 1412     Risk Assessment/Calculations:              Physical Exam:    VS:  BP (!) 142/62 (BP Location: Left Arm, Patient Position: Sitting, Cuff Size: Normal)   Pulse 72   Ht 5' 4 (1.626 m)   Wt 158 lb (71.7 kg)   SpO2 99%   BMI 27.12 kg/m     Wt Readings from Last 3 Encounters:  05/27/24 158 lb (71.7 kg)  05/10/24 155 lb 11.2 oz (70.6 kg)  04/12/24 155 lb 12.8 oz (70.7 kg)     GEN:  Well nourished, well developed in no acute distress HEENT: Normal NECK: No JVD; No carotid bruits CARDIAC: RRR, no murmurs, rubs,  gallops RESPIRATORY:  Clear to auscultation without rales, wheezing or rhonchi  ABDOMEN: Soft, non-tender, non-distended MUSCULOSKELETAL:  No edema; No deformity  SKIN: Warm and dry NEUROLOGIC:  Alert and oriented x 3 PSYCHIATRIC:  Normal affect   ASSESSMENT:    1. Syncope and collapse   2. Bradycardia   3. Primary hypertension    PLAN:    In order of problems listed above:  Presyncope syncope, history of bradycardia, symptoms overall improved/resolved with reducing Toprol -XL and Norvasc .  Cardiac monitor and echocardiogram were unrevealing.   History of sinus bradycardia, resolved with reducing Toprol -XL.  Continue Toprol -XL 25 mg daily.  Hypertension, BP controlled/reasonable for age with systolic in 130-140ss.   Continue Diovan  160 mg daily, Norvasc  2.5 mg daily, HCTZ, metoprolol .  Okay to let BP run high up to 150s okay given history of syncope and patient's age.  Follow-up in 12 months.    Medication Adjustments/Labs and Tests Ordered: Current medicines are reviewed at length with the patient today.  Concerns regarding medicines are outlined above.  Orders Placed This Encounter  Procedures   EKG 12-Lead   No orders of the defined types were placed in this encounter.   Patient Instructions  Medication Instructions:  Your physician recommends that you continue on your current medications as directed. Please refer to the Current Medication list given to you today.   *If you need a refill on your cardiac medications before your next appointment, please call your pharmacy*  Lab Work: No labs ordered today  If you have labs (blood work) drawn today and your tests are completely normal, you will receive your results only by: MyChart Message (if you have MyChart) OR A paper copy in the mail If you have any lab test that is abnormal or we need to change your treatment, we will call you to review the results.  Testing/Procedures: No test ordered today   Follow-Up: At [facility], you and your health needs are our priority.  As part of our continuing mission to provide you with exceptional heart care, our providers are all part of one team.  This team includes your primary Cardiologist (physician) and Advanced Practice Providers or APPs (Physician Assistants and Nurse Practitioners) who all work together to provide you with the care you need, when you need it.  Your next appointment:   1 year(s)  Provider:   You may see Redell Cave, MD or one of the following Advanced Practice Providers on your designated Care Team:   Lonni Meager, NP Lesley Maffucci, PA-C Bernardino Bring, PA-C Cadence Claypool, PA-C Tylene Lunch, NP Barnie Hila, NP    We recommend signing up for the patient portal called MyChart.  Sign up information is provided on this After Visit Summary.  MyChart is used to connect with patients for Virtual Visits (Telemedicine).  Patients are able to view lab/test results, encounter notes, upcoming appointments, etc.  Non-urgent messages can be sent to your provider as well.   To learn more about what you can do with MyChart, go to [email or web address].              Signed, Redell Cave, MD  05/27/2024 12:28 PM    [facility]

## 2024-05-27 NOTE — Patient Instructions (Signed)

## 2024-05-31 ENCOUNTER — Other Ambulatory Visit: Payer: Self-pay | Admitting: Internal Medicine

## 2024-06-07 ENCOUNTER — Inpatient Hospital Stay: Attending: Oncology

## 2024-06-07 ENCOUNTER — Encounter: Payer: Self-pay | Admitting: Oncology

## 2024-06-07 ENCOUNTER — Inpatient Hospital Stay

## 2024-06-07 ENCOUNTER — Other Ambulatory Visit: Payer: Self-pay

## 2024-06-07 ENCOUNTER — Inpatient Hospital Stay (HOSPITAL_BASED_OUTPATIENT_CLINIC_OR_DEPARTMENT_OTHER): Admitting: Oncology

## 2024-06-07 VITALS — BP 152/67 | HR 74 | Temp 97.6°F

## 2024-06-07 VITALS — BP 142/67 | HR 72 | Temp 97.9°F | Resp 18 | Wt 156.7 lb

## 2024-06-07 DIAGNOSIS — Z9089 Acquired absence of other organs: Secondary | ICD-10-CM | POA: Insufficient documentation

## 2024-06-07 DIAGNOSIS — Z7982 Long term (current) use of aspirin: Secondary | ICD-10-CM | POA: Diagnosis not present

## 2024-06-07 DIAGNOSIS — C9000 Multiple myeloma not having achieved remission: Secondary | ICD-10-CM | POA: Diagnosis not present

## 2024-06-07 DIAGNOSIS — Z823 Family history of stroke: Secondary | ICD-10-CM | POA: Insufficient documentation

## 2024-06-07 DIAGNOSIS — I2510 Atherosclerotic heart disease of native coronary artery without angina pectoris: Secondary | ICD-10-CM | POA: Diagnosis not present

## 2024-06-07 DIAGNOSIS — Z5111 Encounter for antineoplastic chemotherapy: Secondary | ICD-10-CM | POA: Diagnosis not present

## 2024-06-07 DIAGNOSIS — C9001 Multiple myeloma in remission: Secondary | ICD-10-CM

## 2024-06-07 DIAGNOSIS — R5383 Other fatigue: Secondary | ICD-10-CM | POA: Diagnosis not present

## 2024-06-07 DIAGNOSIS — N1831 Chronic kidney disease, stage 3a: Secondary | ICD-10-CM | POA: Insufficient documentation

## 2024-06-07 DIAGNOSIS — Z79624 Long term (current) use of inhibitors of nucleotide synthesis: Secondary | ICD-10-CM | POA: Diagnosis not present

## 2024-06-07 DIAGNOSIS — Z79620 Long term (current) use of immunosuppressive biologic: Secondary | ICD-10-CM | POA: Diagnosis not present

## 2024-06-07 DIAGNOSIS — Z803 Family history of malignant neoplasm of breast: Secondary | ICD-10-CM | POA: Insufficient documentation

## 2024-06-07 DIAGNOSIS — Z8261 Family history of arthritis: Secondary | ICD-10-CM | POA: Insufficient documentation

## 2024-06-07 DIAGNOSIS — I700 Atherosclerosis of aorta: Secondary | ICD-10-CM | POA: Insufficient documentation

## 2024-06-07 DIAGNOSIS — I10 Essential (primary) hypertension: Secondary | ICD-10-CM | POA: Diagnosis not present

## 2024-06-07 DIAGNOSIS — D6481 Anemia due to antineoplastic chemotherapy: Secondary | ICD-10-CM | POA: Diagnosis not present

## 2024-06-07 DIAGNOSIS — E7800 Pure hypercholesterolemia, unspecified: Secondary | ICD-10-CM | POA: Insufficient documentation

## 2024-06-07 DIAGNOSIS — Z5112 Encounter for antineoplastic immunotherapy: Secondary | ICD-10-CM | POA: Diagnosis not present

## 2024-06-07 DIAGNOSIS — T451X5A Adverse effect of antineoplastic and immunosuppressive drugs, initial encounter: Secondary | ICD-10-CM | POA: Diagnosis not present

## 2024-06-07 DIAGNOSIS — Z79899 Other long term (current) drug therapy: Secondary | ICD-10-CM | POA: Insufficient documentation

## 2024-06-07 DIAGNOSIS — D631 Anemia in chronic kidney disease: Secondary | ICD-10-CM | POA: Diagnosis not present

## 2024-06-07 DIAGNOSIS — Z8249 Family history of ischemic heart disease and other diseases of the circulatory system: Secondary | ICD-10-CM | POA: Diagnosis not present

## 2024-06-07 DIAGNOSIS — D649 Anemia, unspecified: Secondary | ICD-10-CM | POA: Diagnosis not present

## 2024-06-07 DIAGNOSIS — M549 Dorsalgia, unspecified: Secondary | ICD-10-CM | POA: Diagnosis not present

## 2024-06-07 LAB — COMPREHENSIVE METABOLIC PANEL WITH GFR
ALT: 14 U/L (ref 0–44)
AST: 29 U/L (ref 15–41)
Albumin: 3.6 g/dL (ref 3.5–5.0)
Alkaline Phosphatase: 47 U/L (ref 38–126)
Anion gap: 11 (ref 5–15)
BUN: 19 mg/dL (ref 8–23)
CO2: 22 mmol/L (ref 22–32)
Calcium: 8.9 mg/dL (ref 8.9–10.3)
Chloride: 106 mmol/L (ref 98–111)
Creatinine, Ser: 1.2 mg/dL — ABNORMAL HIGH (ref 0.44–1.00)
GFR, Estimated: 44 mL/min — ABNORMAL LOW (ref >60)
Glucose, Bld: 100 mg/dL — ABNORMAL HIGH (ref 70–99)
Potassium: 3.4 mmol/L — ABNORMAL LOW (ref 3.5–5.1)
Sodium: 139 mmol/L (ref 135–145)
Total Bilirubin: 0.4 mg/dL (ref 0.0–1.2)
Total Protein: 6.8 g/dL (ref 6.5–8.1)

## 2024-06-07 LAB — CBC WITH DIFFERENTIAL/PLATELET
Abs Immature Granulocytes: 0.02 K/uL (ref 0.00–0.07)
Basophils Absolute: 0 K/uL (ref 0.0–0.1)
Basophils Relative: 1 %
Eosinophils Absolute: 0.1 K/uL (ref 0.0–0.5)
Eosinophils Relative: 1 %
HCT: 32.9 % — ABNORMAL LOW (ref 36.0–46.0)
Hemoglobin: 10.6 g/dL — ABNORMAL LOW (ref 12.0–15.0)
Immature Granulocytes: 0 %
Lymphocytes Relative: 31 %
Lymphs Abs: 2 K/uL (ref 0.7–4.0)
MCH: 31.5 pg (ref 26.0–34.0)
MCHC: 32.2 g/dL (ref 30.0–36.0)
MCV: 97.6 fL (ref 80.0–100.0)
Monocytes Absolute: 0.2 K/uL (ref 0.1–1.0)
Monocytes Relative: 3 %
Neutro Abs: 4.1 K/uL (ref 1.7–7.7)
Neutrophils Relative %: 64 %
Platelets: 249 K/uL (ref 150–400)
RBC: 3.37 MIL/uL — ABNORMAL LOW (ref 3.87–5.11)
RDW: 12.7 % (ref 11.5–15.5)
WBC: 6.4 K/uL (ref 4.0–10.5)
nRBC: 0 % (ref 0.0–0.2)

## 2024-06-07 MED ORDER — DIPHENHYDRAMINE HCL 25 MG PO CAPS: 25.0000 mg | ORAL_CAPSULE | Freq: Once | ORAL | Status: DC

## 2024-06-07 MED ORDER — ACETAMINOPHEN 325 MG PO TABS: 650.0000 mg | ORAL_TABLET | Freq: Once | ORAL | Status: DC

## 2024-06-07 MED ORDER — DARATUMUMAB-HYALURONIDASE-FIHJ 1800-30000 MG-UT/15ML ~~LOC~~ SOLN
1800.0000 mg | Freq: Once | SUBCUTANEOUS | Status: AC
  Administered 2024-06-07: 1800 mg via SUBCUTANEOUS
  Filled 2024-06-07: qty 15

## 2024-06-07 NOTE — Assessment & Plan Note (Signed)
 Multiple myeloma, IgA kappa, complex hyperploid gain of 1q, S/p Daratumumab  Rd.   Revlimid  D1-21,  07/23/22 bone marrow biopsy  3% plasma cell, no M protein.   Labs are reviewed and discussed with patient.  Lab Results  Component Value Date   MPROTEIN Not Observed 05/10/2024   KPAFRELGTCHN 10.7 05/10/2024   LAMBDASER 6.0 05/10/2024   KAPLAMBRATIO 1.78 (H) 05/10/2024     M protein is stable. Stable light chain ratio.  Proceed with  Daratumumab  monotherapy maintenance - she takes home supply of Dexamethasone  prior to her Daratumumab .  On asprin 81mg  daily and Acyclovir  400mg  BID.

## 2024-06-07 NOTE — Patient Instructions (Signed)

## 2024-06-07 NOTE — Progress Notes (Signed)
 Hematology/Oncology Progress note [phone] [phone]     CHIEF COMPLAINTS/REASON FOR VISIT:  Follow-up for multiple myeloma  ASSESSMENT & PLAN:   Cancer Staging  Multiple myeloma (HCC) Staging form: Plasma Cell Myeloma and Plasma Cell Disorders, AJCC 8th Edition - Clinical stage from 08/22/2021: RISS Stage II (Beta-2 -microglobulin (mg/L): 4.1, Albumin (g/dL): 2.8, ISS: Stage II, High-risk cytogenetics: Absent, LDH: Normal) - Signed by Babara Call, MD on 09/06/2021   Multiple myeloma (HCC) Multiple myeloma, IgA kappa, complex hyperploid gain of 1q, S/p Daratumumab  Rd.   Revlimid  D1-21,  07/23/22 bone marrow biopsy  3% plasma cell, no M protein.   Labs are reviewed and discussed with patient.  Lab Results  Component Value Date   MPROTEIN Not Observed 05/10/2024   KPAFRELGTCHN 10.7 05/10/2024   LAMBDASER 6.0 05/10/2024   KAPLAMBRATIO 1.78 (H) 05/10/2024     M protein is stable. Stable light chain ratio.  Proceed with  Daratumumab  monotherapy maintenance - she takes home supply of Dexamethasone  prior to her Daratumumab .  On asprin 81mg  daily and Acyclovir  400mg  BID.   Encounter for antineoplastic chemotherapy Chemotherapy plan as listed as above.  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  Lab Results  Component Value Date   HGB 10.6 (L) 06/07/2024   TIBC 427 03/15/2024   IRONPCTSAT 16 03/15/2024   FERRITIN 52 03/15/2024       Stage 3a chronic kidney disease (HCC) recommend patient to avoid nephrotoxin.  Encourage oral hydration.  No orders of the defined types were placed in this encounter.    Follow up  Lab/MD/Dara in 4 weeks  All questions were answered. The patient knows to call the clinic with any problems, questions or concerns.  Call Babara, MD, PhD [facility] Hematology Oncology 06/07/2024     HISTORY OF PRESENTING ILLNESS:   Stephanie Delacruz is a  89 y.o.  female presents for follow up of multiple myeloma.   Oncology History  Multiple myeloma (HCC)  08/22/2021 Initial Diagnosis   Multiple myeloma (HCC) IgA kappa Initial M protein 5.2, free kappa light chain ratio 89.2, Kappa/lambda ratio 13.31   08/22/2021 Cancer Staging   Staging form: Plasma Cell Myeloma and Plasma Cell Disorders, AJCC 8th Edition - Clinical stage from 08/22/2021: RISS Stage II (Beta-2 -microglobulin (mg/L): 4.1, Albumin (g/dL): 2.8, ISS: Stage II, High-risk cytogenetics: Absent, LDH: Normal) - Signed by Babara Call, MD on 09/06/2021 Stage prefix: Initial diagnosis Beta 2 microglobulin range (mg/L): 3.5 to 5.49 Albumin range (g/dL): Less than 3.5 Cytogenetics: 1q addition   08/26/2021 - 09/01/2022 Chemotherapy   08/26/21 MYELOMA Daratumumab  SQ q28d  09/06/2021 added Revlimid  10mg  x 14 days.  09/27/2021 starting cycle 2 Revlimid  10 mg 21 days on, 7 days off. Weekly Dexamethasone .    08/26/2021 Bone Marrow Biopsy   Bone marrow biopsy showed hypercellular marrow with plasma cell neoplasm. 67% plasma cells in the aspirate. Cytogenetics showed complex hyperplasia, gain of 1 q.    07/23/2022 Bone Marrow Biopsy   Bone marrow biopsy showed Hypercellular bone marrow for age with trilineage hematopoiesis and 3% plasma cells  Cytogenetics are normal.   04/20/2023 Imaging   PET scan showed  1. Patchy hypermetabolism throughout the spine with focal hypermetabolism in the right third rib and left sacrum, compatible with residual metabolically active multiple myeloma. 2. Hypermetabolic 5 mm right cervical lymph node, indeterminate.Recommend attention on follow-up. 3. 3.6 cm thyroid  nodule, previously evaluated by ultrasound 08/27/2022 and biopsy 10/07/2022. Please refer to pathology report for  further evaluation. No follow-up recommended unless clinically warranted. (Ref: J Am Coll Radiol. 2015 Feb;12(2): 143-50). 4. Aortic atherosclerosis (ICD10-I70.0). Coronary artery calcification.    S/p thyroid  lesion biopsy, obtained by endocrinologist.  + AUS  INTERVAL HISTORY Stephanie Delacruz is a 89 y.o. female who has above history reviewed by me today presents for follow up visit for management of multiple myeloma She was accompanied by her daughter today.  She denies any nausea vomiting diarrhea.   No new complaints.   Review of Systems  Constitutional:  Positive for fatigue. Negative for appetite change, chills and fever.  HENT:   Negative for hearing loss and voice change.   Eyes:  Negative for eye problems.  Respiratory:  Negative for chest tightness and cough.   Cardiovascular:  Negative for chest pain.  Gastrointestinal:  Negative for abdominal distention, abdominal pain and blood in stool.  Endocrine: Negative for hot flashes.  Genitourinary:  Negative for difficulty urinating and frequency.   Musculoskeletal:  Positive for back pain. Negative for arthralgias.  Skin:  Negative for itching and rash.  Neurological:  Negative for extremity weakness.  Hematological:  Negative for adenopathy.  Psychiatric/Behavioral:  Negative for confusion.     MEDICAL HISTORY:  Past Medical History:  Diagnosis Date   Anemia    GERD (gastroesophageal reflux disease)    Hypercholesterolemia    Hypertension    Renal insufficiency     SURGICAL HISTORY: Past Surgical History:  Procedure Laterality Date   TONSILLECTOMY     TUBAL LIGATION      SOCIAL HISTORY: Social History   Socioeconomic History   Marital status: Married    Spouse name: Not on file   Number of children: Not on file   Years of education: Not on file   Highest education level: Not on file  Occupational History   Not on file  Tobacco Use   Smoking status: Never   Smokeless tobacco: Never  Vaping Use   Vaping status: Never Used  Substance and Sexual Activity   Alcohol use: No   Drug use: No   Sexual activity: Not on file  Other Topics Concern   Not on file  Social History Narrative   Lives with husband Lynwood and daughter Jon. No pets.    Social Drivers  of Corporate Investment Banker Strain: Not on file  Food Insecurity: No Food Insecurity (01/11/2024)   Hunger Vital Sign    Worried About Running Out of Food in the Last Year: Never true    Ran Out of Food in the Last Year: Never true  Transportation Needs: No Transportation Needs (01/11/2024)   PRAPARE - Administrator, Civil Service (Medical): No    Lack of Transportation (Non-Medical): No  Physical Activity: Not on file  Stress: Not on file  Social Connections: Not on file  Intimate Partner Violence: Not At Risk (01/11/2024)   Humiliation, Afraid, Rape, and Kick questionnaire    Fear of Current or Ex-Partner: No    Emotionally Abused: No    Physically Abused: No    Sexually Abused: No    FAMILY HISTORY: Family History  Problem Relation Age of Onset   Hypertension Mother    Arthritis Mother    Hypertension Father    Stroke Father    Breast cancer Niece     ALLERGIES:  has no known allergies.  MEDICATIONS:  Current Outpatient Medications  Medication Sig Dispense Refill   acyclovir  (ZOVIRAX ) 400 MG tablet Take 1  tablet (400 mg total) by mouth 2 (two) times daily. 60 tablet 11   amLODipine  (NORVASC ) 2.5 MG tablet Take 1 tablet (2.5 mg total) by mouth daily.     ascorbic acid (VITAMIN C) 250 MG tablet daily Take 2 tablets every day by oral route.     aspirin 81 MG EC tablet Take 81 mg by mouth daily.     Calcium Carb-Cholecalciferol (CALCIUM 600+D) 600-20 MG-MCG TABS Take 1 tablet by mouth 3 (three) times daily.     dexamethasone  (DECADRON ) 4 MG tablet Take 5 tablets (20 mg total) by mouth See admin instructions. Take 20mg  prior to each Daratumumab  treatment. 30 tablet 1   donepezil  (ARICEPT ) 5 MG tablet Take 10 mg by mouth daily.     fenofibrate  (TRICOR ) 48 MG tablet TAKE 1 TABLET(48 MG) BY MOUTH DAILY 90 tablet 2   fexofenadine (ALLEGRA) 180 MG tablet Take by mouth.     hydrochlorothiazide  (HYDRODIURIL ) 25 MG tablet TAKE 1/2 TABLET BY MOUTH EVERY DAY 45 tablet 3    memantine (NAMENDA) 5 MG tablet Take 5 mg by mouth 2 (two) times daily.     methimazole  (TAPAZOLE ) 5 MG tablet TAKE 1 TABLET(5 MG) BY MOUTH DAILY 90 tablet 3   metoprolol  succinate (TOPROL -XL) 25 MG 24 hr tablet Take 1 tablet (25 mg total) by mouth daily. 90 tablet 3   montelukast  (SINGULAIR ) 10 MG tablet Take 1 tablet (10 mg total) by mouth See admin instructions. Take 1 tablet one day before Daratumumab  injection. 60 tablet 0   Multiple Vitamins-Minerals (WOMENS MULTIVITAMIN PO) Take 1 tablet by mouth daily.     omeprazole  (PRILOSEC) 20 MG capsule TAKE 1 CAPSULE(20 MG) BY MOUTH DAILY 90 capsule 3   prochlorperazine  (COMPAZINE ) 10 MG tablet Take 10 mg by mouth every 6 (six) hours as needed for nausea or vomiting.     valsartan  (DIOVAN ) 160 MG tablet TAKE 1 TABLET(160 MG) BY MOUTH DAILY 90 tablet 1   No current facility-administered medications for this visit.   Facility-Administered Medications Ordered in Other Visits  Medication Dose Route Frequency Provider Last Rate Last Admin   acetaminophen  (TYLENOL ) tablet 650 mg  650 mg Oral Once Breylon Sherrow, MD       diphenhydrAMINE  (BENADRYL ) capsule 25 mg  25 mg Oral Once Babara Call, MD         PHYSICAL EXAMINATION: ECOG PERFORMANCE STATUS: 1 - Symptomatic but completely ambulatory Vitals:   06/07/24 1037  BP: (!) 142/67  Pulse: 72  Resp: 18  Temp: 97.9 F (36.6 C)  SpO2: 100%   Filed Weights   06/07/24 1037  Weight: 156 lb 11.2 oz (71.1 kg)    Physical Exam Constitutional:      General: She is not in acute distress. HENT:     Head: Normocephalic and atraumatic.  Eyes:     General: No scleral icterus. Cardiovascular:     Rate and Rhythm: Normal rate and regular rhythm.     Heart sounds: Normal heart sounds.  Pulmonary:     Effort: Pulmonary effort is normal. No respiratory distress.     Breath sounds: No wheezing.  Abdominal:     General: Bowel sounds are normal. There is no distension.     Palpations: Abdomen is soft.   Musculoskeletal:        General: No deformity. Normal range of motion.     Cervical back: Normal range of motion and neck supple.  Skin:    General: Skin is warm and dry.  Findings: No erythema or rash.  Neurological:     Mental Status: She is alert and oriented to person, place, and time. Mental status is at baseline.     Cranial Nerves: No cranial nerve deficit.     Coordination: Coordination normal.  Psychiatric:        Mood and Affect: Mood normal.     LABORATORY DATA:  I have reviewed the data as listed    Latest Ref Rng & Units 06/07/2024   10:28 AM 05/10/2024    8:53 AM 04/12/2024   10:12 AM  CBC  WBC 4.0 - 10.5 K/uL 6.4  5.7  6.3   Hemoglobin 12.0 - 15.0 g/dL 89.3  88.9  89.1   Hematocrit 36.0 - 46.0 % 32.9  33.2  33.1   Platelets 150 - 400 K/uL 249  220  231       Latest Ref Rng & Units 06/07/2024   10:28 AM 05/10/2024    8:53 AM 04/12/2024   10:12 AM  CMP  Glucose 70 - 99 mg/dL 899  84  97   BUN 8 - 23 mg/dL 19  22  22    Creatinine 0.44 - 1.00 mg/dL 8.79  8.83  8.80   Sodium 135 - 145 mmol/L 139  141  140   Potassium 3.5 - 5.1 mmol/L 3.4  3.6  3.7   Chloride 98 - 111 mmol/L 106  107  107   CO2 22 - 32 mmol/L 22  24  25    Calcium 8.9 - 10.3 mg/dL 8.9  9.6  9.5   Total Protein 6.5 - 8.1 g/dL 6.8  6.9  6.7   Total Bilirubin 0.0 - 1.2 mg/dL 0.4  0.5  0.5   Alkaline Phos 38 - 126 U/L 47  44  47   AST 15 - 41 U/L 29  28  26    ALT 0 - 44 U/L 14  16  14       Iron/TIBC/Ferritin/ %Sat    Component Value Date/Time   IRON 67 03/15/2024 0903   TIBC 427 03/15/2024 0903   FERRITIN 52 03/15/2024 0903   IRONPCTSAT 16 03/15/2024 0903       RADIOGRAPHIC STUDIES: I have personally reviewed the radiological images as listed and agreed with the findings in the report. No results found.

## 2024-06-07 NOTE — Assessment & Plan Note (Signed)
recommend patient to avoid nephrotoxin.  Encourage oral hydration. 

## 2024-06-07 NOTE — Assessment & Plan Note (Signed)
Chemotherapy plan as listed as above. 

## 2024-06-07 NOTE — Assessment & Plan Note (Signed)
 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  Lab Results  Component Value Date   HGB 10.6 (L) 06/07/2024   TIBC 427 03/15/2024   IRONPCTSAT 16 03/15/2024   FERRITIN 52 03/15/2024

## 2024-06-07 NOTE — Progress Notes (Signed)
 Pt took home benadryl , dexamethasone , and tylenol  prior to appointment.

## 2024-06-08 LAB — KAPPA/LAMBDA LIGHT CHAINS
Kappa free light chain: 11.8 mg/L (ref 3.3–19.4)
Kappa, lambda light chain ratio: 2 — ABNORMAL HIGH (ref 0.26–1.65)
Lambda free light chains: 5.9 mg/L (ref 5.7–26.3)

## 2024-06-09 MED ORDER — METOPROLOL SUCCINATE ER 25 MG PO TB24: 25.0000 mg | ORAL_TABLET | Freq: Every day | ORAL | 3 refills | Status: AC

## 2024-06-10 LAB — MULTIPLE MYELOMA PANEL, SERUM
Albumin SerPl Elph-Mcnc: 3.4 g/dL (ref 2.9–4.4)
Albumin/Glob SerPl: 1.3 (ref 0.7–1.7)
Alpha 1: 0.2 g/dL (ref 0.0–0.4)
Alpha2 Glob SerPl Elph-Mcnc: 0.7 g/dL (ref 0.4–1.0)
B-Globulin SerPl Elph-Mcnc: 1.3 g/dL (ref 0.7–1.3)
Gamma Glob SerPl Elph-Mcnc: 0.4 g/dL (ref 0.4–1.8)
Globulin, Total: 2.7 g/dL (ref 2.2–3.9)
IgA: 257 mg/dL (ref 64–422)
IgG (Immunoglobin G), Serum: 583 mg/dL — ABNORMAL LOW (ref 586–1602)
IgM (Immunoglobulin M), Srm: 25 mg/dL — ABNORMAL LOW (ref 26–217)
M Protein SerPl Elph-Mcnc: 0.5 g/dL — ABNORMAL HIGH
Total Protein ELP: 6.1 g/dL (ref 6.0–8.5)

## 2024-07-04 ENCOUNTER — Encounter: Payer: Self-pay | Admitting: Internal Medicine

## 2024-07-04 ENCOUNTER — Other Ambulatory Visit

## 2024-07-04 ENCOUNTER — Ambulatory Visit: Admitting: Internal Medicine

## 2024-07-04 VITALS — BP 130/84 | Ht 64.0 in | Wt 157.0 lb

## 2024-07-04 DIAGNOSIS — E0590 Thyrotoxicosis, unspecified without thyrotoxic crisis or storm: Secondary | ICD-10-CM | POA: Diagnosis not present

## 2024-07-04 DIAGNOSIS — E042 Nontoxic multinodular goiter: Secondary | ICD-10-CM | POA: Diagnosis not present

## 2024-07-04 NOTE — Progress Notes (Unsigned)
 Name: Stephanie Delacruz  MRN/ DOB: [id number], [birth year]    Age/ Sex: 89 y.o., female    PCP: Glendia Shad, MD   Reason for Endocrinology Evaluation: Multinodular goiter     Date of Initial Endocrinology Evaluation: 09/16/2022    HPI: Ms. Stephanie Delacruz is a 89 y.o. female with a past medical history of multiple myeloma, HTN, and dyslipidemia. The patient presented for initial endocrinology clinic visit on 09/16/2022 for consultative assistance with her multinodular goiter  Pt has been noted with multinodular goiter on ultrasound 07/2022 with two nodules meeting FNA criteria.  Patient on chemotherapy   TRAb undetectable  She is s/p FNA of the isthmic nodule with cytology report consistent with atypia of uncertain significance (Bethesda category III) on 10/07/2022, ThyroSeq was reported as negative but limited  She is also s/p benign FNA of left mid thyroid  nodule, Bethesda category II  No family history of thyroid  disease  SUBJECTIVE:    Today (07/04/24): Ms. Nierman is here for follow-up on multinodular goiter   Patient is accompanied by [name] She continues to follow-up with oncology for the treatment of multiple myeloma, she is on maintenance therapy with daratumumab   Recent PET scan showed residual metabolic active multiple myeloma at the right third rib and left sacrum, as well as a right cervical lymph node. No local neck swelling  No palpitations  No tremors  No constipation or diarrhea   No biotin intake , except for MVI   Methimazole  5 mg daily     HISTORY:  Past Medical History:  Past Medical History:  Diagnosis Date   Anemia    GERD (gastroesophageal reflux disease)    Hypercholesterolemia    Hypertension    Renal insufficiency    Past Surgical History:  Past Surgical History:  Procedure Laterality Date   TONSILLECTOMY     TUBAL LIGATION      Social History:  reports that she has never smoked. She has never used smokeless tobacco. She  reports that she does not drink alcohol and does not use drugs. Family History: family history includes Arthritis in her mother; Breast cancer in her niece; Hypertension in her father and mother; Stroke in her father.   HOME MEDICATIONS: Allergies as of 07/04/2024   No Known Allergies      Medication List        Accurate as of July 04, 2024 10:44 AM. If you have any questions, ask your nurse or doctor.          acyclovir  400 MG tablet Commonly known as: ZOVIRAX  Take 1 tablet (400 mg total) by mouth 2 (two) times daily.   amLODipine  2.5 MG tablet Commonly known as: NORVASC  Take 1 tablet (2.5 mg total) by mouth daily.   ascorbic acid 250 MG tablet Commonly known as: VITAMIN C  daily Take 2 tablets every day by oral route.   aspirin EC 81 MG tablet Take 81 mg by mouth daily.   Calcium 600+D 600-20 MG-MCG Tabs Generic drug: Calcium Carb-Cholecalciferol Take 1 tablet by mouth 3 (three) times daily.   dexamethasone  4 MG tablet Commonly known as: DECADRON  Take 5 tablets (20 mg total) by mouth See admin instructions. Take 20mg  prior to each Daratumumab  treatment.   donepezil  5 MG tablet Commonly known as: ARICEPT  Take 10 mg by mouth daily.   fenofibrate  48 MG tablet Commonly known as: TRICOR  TAKE 1 TABLET(48 MG) BY MOUTH DAILY   fexofenadine 180 MG tablet Commonly known as: ALLEGRA  Take by mouth.   hydrochlorothiazide  25 MG tablet Commonly known as: HYDRODIURIL  TAKE 1/2 TABLET BY MOUTH EVERY DAY   memantine 5 MG tablet Commonly known as: NAMENDA Take 5 mg by mouth 2 (two) times daily.   methimazole  5 MG tablet Commonly known as: TAPAZOLE  TAKE 1 TABLET(5 MG) BY MOUTH DAILY   metoprolol  succinate 25 MG 24 hr tablet Commonly known as: TOPROL -XL Take 1 tablet (25 mg total) by mouth daily.   montelukast  10 MG tablet Commonly known as: Singulair  Take 1 tablet (10 mg total) by mouth See admin instructions. Take 1 tablet one day before Daratumumab   injection.   omeprazole  20 MG capsule Commonly known as: PRILOSEC TAKE 1 CAPSULE(20 MG) BY MOUTH DAILY   prochlorperazine  10 MG tablet Commonly known as: COMPAZINE  Take 10 mg by mouth every 6 (six) hours as needed for nausea or vomiting.   valsartan  160 MG tablet Commonly known as: DIOVAN  TAKE 1 TABLET(160 MG) BY MOUTH DAILY   WOMENS MULTIVITAMIN PO Take 1 tablet by mouth daily.          REVIEW OF SYSTEMS: A comprehensive ROS was conducted with the patient and is negative except as per HPI   OBJECTIVE:  VS: BP (!) 160/70   Ht 5' 4 (1.626 m)   Wt 157 lb (71.2 kg)   BMI 26.95 kg/m    Wt Readings from Last 3 Encounters:  07/04/24 157 lb (71.2 kg)  06/07/24 156 lb 11.2 oz (71.1 kg)  05/27/24 158 lb (71.7 kg)     EXAM: General: Pt appears well and is in NAD  Neck: General: Supple without adenopathy. Thyroid : Isthmic nodule appreciated   Lungs: Clear with good BS bilat   Heart: Auscultation: RRR.  Extremities:  BL LE: No pretibial edema , swelling noted around the ankles  Mental Status: Judgment, insight: Intact Orientation: Oriented to time, place, and person Mood and affect: No depression, anxiety, or agitation     DATA REVIEWED:   Latest Reference Range & Units 11/30/23 13:37  TSH 0.35 - 5.50 uIU/mL 1.47     Thyroid  ultrasound 08/27/2022  Estimated total number of nodules >/= 1 cm: 3   Number of spongiform nodules >/=  2 cm not described below (TR1): 0   Number of mixed cystic and solid nodules >/= 1.5 cm not described below (TR2): 0   _________________________________________________________   Nodule # 1: Approximately 1.2 cm spongiform nodule in the thyroid  isthmus is considered sonographically low risk/benign. No imaging follow-up recommended.   Nodule # 2:   Location: Isthmus; Inferior   Maximum size: 3.2 cm; Other 2 dimensions: 2.4 x 3.1 cm   Composition: solid/almost completely solid (2)   Echogenicity: isoechoic (1)   Shape: not  taller-than-wide (0)   Margins: smooth (0)   Echogenic foci: none (0)   ACR TI-RADS total points: 3.   ACR TI-RADS risk category: TR3 (3 points).   ACR TI-RADS recommendations:   **Given size (>/= 2.5 cm) and appearance, fine needle aspiration of this mildly suspicious nodule should be considered based on TI-RADS criteria.   _________________________________________________________   Nodule # 3: Anechoic cyst with peripheral colloid artifact in the right mid gland measures up to 1.7 cm. Findings are consistent with a benign colloid nodule.   Nodule # 4:   Location: Left; Mid   Maximum size: 2.7 cm; Other 2 dimensions: 1.5 x 2.1 cm   Composition: solid/almost completely solid (2)   Echogenicity: isoechoic (1)   Shape: not taller-than-wide (0)  Margins: ill-defined (0)   Echogenic foci: none (0)   ACR TI-RADS total points: 3.   ACR TI-RADS risk category: TR3 (3 points).   ACR TI-RADS recommendations:   **Given size (>/= 2.5 cm) and appearance, fine needle aspiration of this mildly suspicious nodule should be considered based on TI-RADS criteria.   _________________________________________________________   IMPRESSION: 1. Enlarged, heterogeneous and multinodular thyroid  gland most consistent with multinodular goiter. 2. Large TI-RADS category 3 nodules in the thyroid  isthmus (labeled # 2) and the left mid gland (labeled # 4) both meet criteria to consider fine-needle aspiration biopsy.  FNA  left mid nodule 10/07/2022    DIAGNOSIS:  A. THYROID  NODULE, LEFT MID; FNA:  - BENIGN (BETHESDA 2)  - FOLLICULAR EPITHELIAL CELLS, ABUNDANT COLLOID AND LYMPHOCYTES.     FNA left inferior isthmic  10/07/2022 Specimen Submitted:  A. Isthmus, inferior; FNA   DIAGNOSIS:  A. THYROID , ISTHMUS (INFERIOR); FNA  - ATYPICAL FOLLICULAR CELLS OF UNCERTAIN SIGNIFICANCE (AUS) BETHESDA 3.  - BLOOD, HEMOSIDERIN LADEN HISTIOCYTES AND A FEW LYMPHOCYTES.   Thyroseq : negative     ASSESSMENT/PLAN/RECOMMENDATIONS:   Multinodular goiter  -No local neck symptoms -Most likely reason for hyperthyroid -She is s/p benign FNA of the left mid thyroid  nodule as well as atypia of undetermined significance of the isthmic nodule with benign ThyroSeq 09/2022 -We will repeat thyroid  ultrasound by next visit   2. Hyperthyroidism:  -Most likely due to autonomous thyroid  nodule - Repeat TFTs through oncology was reviewed from May, 2025 with normal TSH, no change   Medication Continue methimazole  5 mg daily   Follow-up in 6 months  Signed electronically by: Stefano Redgie Butts, MD  [facility] Endocrinology  [facility] [address] [place], [place] [address] Phone: [phone] FAX: [phone]   CC: Glendia Shad, MD [address] [place] [address] Phone: [phone] Fax: [phone]   Return to Endocrinology clinic as below: Future Appointments  Date Time Provider Department Center  07/04/2024 10:50 AM Liesl Simons, Donell Redgie, MD [facility] None  07/05/2024  8:45 AM [facility] None  07/05/2024  9:00 AM Babara Call, MD [facility] None  07/05/2024  9:15 AM [facility] INFUSION CHAIR 1 [facility] None  08/10/2024 10:30 AM Glendia Shad, MD [facility] [id number]  12/09/2024 11:00 AM Delores Orvin BRAVO, NP [facility] None

## 2024-07-05 ENCOUNTER — Inpatient Hospital Stay: Admitting: Oncology

## 2024-07-05 ENCOUNTER — Inpatient Hospital Stay

## 2024-07-05 ENCOUNTER — Ambulatory Visit: Payer: Self-pay | Admitting: Internal Medicine

## 2024-07-05 ENCOUNTER — Encounter: Payer: Self-pay | Admitting: Oncology

## 2024-07-05 ENCOUNTER — Inpatient Hospital Stay: Attending: Oncology

## 2024-07-05 ENCOUNTER — Other Ambulatory Visit: Payer: Self-pay

## 2024-07-05 VITALS — BP 142/67 | HR 67 | Temp 96.9°F | Resp 18 | Wt 157.8 lb

## 2024-07-05 VITALS — BP 130/57 | HR 67

## 2024-07-05 DIAGNOSIS — I700 Atherosclerosis of aorta: Secondary | ICD-10-CM | POA: Diagnosis not present

## 2024-07-05 DIAGNOSIS — Z7982 Long term (current) use of aspirin: Secondary | ICD-10-CM | POA: Diagnosis not present

## 2024-07-05 DIAGNOSIS — Z9089 Acquired absence of other organs: Secondary | ICD-10-CM | POA: Diagnosis not present

## 2024-07-05 DIAGNOSIS — C9001 Multiple myeloma in remission: Secondary | ICD-10-CM

## 2024-07-05 DIAGNOSIS — Z79899 Other long term (current) drug therapy: Secondary | ICD-10-CM | POA: Diagnosis not present

## 2024-07-05 DIAGNOSIS — Z803 Family history of malignant neoplasm of breast: Secondary | ICD-10-CM | POA: Diagnosis not present

## 2024-07-05 DIAGNOSIS — Z8261 Family history of arthritis: Secondary | ICD-10-CM | POA: Insufficient documentation

## 2024-07-05 DIAGNOSIS — M549 Dorsalgia, unspecified: Secondary | ICD-10-CM | POA: Insufficient documentation

## 2024-07-05 DIAGNOSIS — D649 Anemia, unspecified: Secondary | ICD-10-CM | POA: Diagnosis not present

## 2024-07-05 DIAGNOSIS — E7800 Pure hypercholesterolemia, unspecified: Secondary | ICD-10-CM | POA: Insufficient documentation

## 2024-07-05 DIAGNOSIS — Z5111 Encounter for antineoplastic chemotherapy: Secondary | ICD-10-CM

## 2024-07-05 DIAGNOSIS — T451X5A Adverse effect of antineoplastic and immunosuppressive drugs, initial encounter: Secondary | ICD-10-CM | POA: Insufficient documentation

## 2024-07-05 DIAGNOSIS — Z5112 Encounter for antineoplastic immunotherapy: Secondary | ICD-10-CM | POA: Diagnosis present

## 2024-07-05 DIAGNOSIS — R5383 Other fatigue: Secondary | ICD-10-CM | POA: Insufficient documentation

## 2024-07-05 DIAGNOSIS — C9000 Multiple myeloma not having achieved remission: Secondary | ICD-10-CM | POA: Diagnosis not present

## 2024-07-05 DIAGNOSIS — D631 Anemia in chronic kidney disease: Secondary | ICD-10-CM | POA: Insufficient documentation

## 2024-07-05 DIAGNOSIS — Z823 Family history of stroke: Secondary | ICD-10-CM | POA: Diagnosis not present

## 2024-07-05 DIAGNOSIS — G8929 Other chronic pain: Secondary | ICD-10-CM | POA: Insufficient documentation

## 2024-07-05 DIAGNOSIS — Z8249 Family history of ischemic heart disease and other diseases of the circulatory system: Secondary | ICD-10-CM | POA: Diagnosis not present

## 2024-07-05 DIAGNOSIS — N1831 Chronic kidney disease, stage 3a: Secondary | ICD-10-CM

## 2024-07-05 DIAGNOSIS — D6481 Anemia due to antineoplastic chemotherapy: Secondary | ICD-10-CM | POA: Diagnosis not present

## 2024-07-05 DIAGNOSIS — I2510 Atherosclerotic heart disease of native coronary artery without angina pectoris: Secondary | ICD-10-CM | POA: Diagnosis not present

## 2024-07-05 DIAGNOSIS — I10 Essential (primary) hypertension: Secondary | ICD-10-CM | POA: Diagnosis not present

## 2024-07-05 LAB — COMPREHENSIVE METABOLIC PANEL WITH GFR
ALT: 15 U/L (ref 0–44)
AST: 22 U/L (ref 15–41)
Albumin: 3.9 g/dL (ref 3.5–5.0)
Alkaline Phosphatase: 54 U/L (ref 38–126)
Anion gap: 14 (ref 5–15)
BUN: 20 mg/dL (ref 8–23)
CO2: 22 mmol/L (ref 22–32)
Calcium: 9.7 mg/dL (ref 8.9–10.3)
Chloride: 110 mmol/L (ref 98–111)
Creatinine, Ser: 1.17 mg/dL — ABNORMAL HIGH (ref 0.44–1.00)
GFR, Estimated: 45 mL/min — ABNORMAL LOW (ref >60)
Glucose, Bld: 95 mg/dL (ref 70–99)
Potassium: 3.7 mmol/L (ref 3.5–5.1)
Sodium: 145 mmol/L (ref 135–145)
Total Bilirubin: 0.2 mg/dL (ref 0.0–1.2)
Total Protein: 6.4 g/dL — ABNORMAL LOW (ref 6.5–8.1)

## 2024-07-05 LAB — CBC WITH DIFFERENTIAL/PLATELET
Abs Immature Granulocytes: 0.02 K/uL (ref 0.00–0.07)
Basophils Absolute: 0.1 K/uL (ref 0.0–0.1)
Basophils Relative: 1 %
Eosinophils Absolute: 0.2 K/uL (ref 0.0–0.5)
Eosinophils Relative: 3 %
HCT: 31.1 % — ABNORMAL LOW (ref 36.0–46.0)
Hemoglobin: 10.1 g/dL — ABNORMAL LOW (ref 12.0–15.0)
Immature Granulocytes: 0 %
Lymphocytes Relative: 37 %
Lymphs Abs: 2.1 K/uL (ref 0.7–4.0)
MCH: 32 pg (ref 26.0–34.0)
MCHC: 32.5 g/dL (ref 30.0–36.0)
MCV: 98.4 fL (ref 80.0–100.0)
Monocytes Absolute: 0.4 K/uL (ref 0.1–1.0)
Monocytes Relative: 6 %
Neutro Abs: 2.9 K/uL (ref 1.7–7.7)
Neutrophils Relative %: 53 %
Platelets: 217 K/uL (ref 150–400)
RBC: 3.16 MIL/uL — ABNORMAL LOW (ref 3.87–5.11)
RDW: 12.8 % (ref 11.5–15.5)
WBC: 5.6 K/uL (ref 4.0–10.5)
nRBC: 0 % (ref 0.0–0.2)

## 2024-07-05 LAB — T4, FREE: Free T4: 1.3 ng/dL (ref 0.8–1.8)

## 2024-07-05 LAB — TSH: TSH: 2.32 m[IU]/L (ref 0.40–4.50)

## 2024-07-05 MED ORDER — DARATUMUMAB-HYALURONIDASE-FIHJ 1800-30000 MG-UT/15ML ~~LOC~~ SOLN
1800.0000 mg | Freq: Once | SUBCUTANEOUS | Status: AC
  Administered 2024-07-05: 1800 mg via SUBCUTANEOUS
  Filled 2024-07-05: qty 15

## 2024-07-05 MED ORDER — METHIMAZOLE 5 MG PO TABS: 5.0000 mg | ORAL_TABLET | Freq: Every day | ORAL | 3 refills | Status: AC

## 2024-07-05 MED ORDER — ACETAMINOPHEN 325 MG PO TABS: 650.0000 mg | ORAL_TABLET | Freq: Once | ORAL | Status: DC

## 2024-07-05 MED ORDER — IRON-VITAMIN C 65-125 MG PO TABS: 1.0000 | ORAL_TABLET | Freq: Every day | ORAL | 2 refills | Status: AC

## 2024-07-05 MED ORDER — DIPHENHYDRAMINE HCL 25 MG PO TABS: 25.0000 mg | ORAL_TABLET | Freq: Once | ORAL | Status: DC

## 2024-07-05 NOTE — Assessment & Plan Note (Addendum)
 Multiple myeloma, IgA kappa, complex hyperploid gain of 1q, S/p Daratumumab  Rd.   Revlimid  D1-21,  07/23/22 bone marrow biopsy  3% plasma cell, no M protein.   Labs are reviewed and discussed with patient.  Lab Results  Component Value Date   MPROTEIN 0.5 (H) 06/07/2024   KPAFRELGTCHN 11.8 06/07/2024   LAMBDASER 5.9 06/07/2024   KAPLAMBRATIO 2.00 (H) 06/07/2024     M protein has increased 0.5.  Today's level is pending.  Stable light chain ratio.  Proceed with  Daratumumab  monotherapy maintenance - she takes home supply of Dexamethasone  prior to her Daratumumab .  If M protein progressively increased, consider repeat bone marrow biopsy for further evaluation. On asprin 81mg  daily and Acyclovir  400mg  BID.

## 2024-07-05 NOTE — Patient Instructions (Signed)
 [facility] - A DEPT OF [facility]  Discharge Instructions: Thank you for choosing [facility] to provide your oncology and hematology care.  If you have a lab appointment with the Cancer Center, please go directly to the Cancer Center and check in at the registration area.  Wear comfortable clothing and clothing appropriate for easy access to any Portacath or PICC line.   We strive to give you quality time with your provider. You may need to reschedule your appointment if you arrive late (15 or more minutes).  Arriving late affects you and other patients whose appointments are after yours.  Also, if you miss three or more appointments without notifying the office, you may be dismissed from the clinic at the provider's discretion.      For prescription refill requests, have your pharmacy contact our office and allow 72 hours for refills to be completed.    Today you received the following chemotherapy and/or immunotherapy agents :   daratumumab     To help prevent nausea and vomiting after your treatment, we encourage you to take your nausea medication as directed.  BELOW ARE SYMPTOMS THAT SHOULD BE REPORTED IMMEDIATELY: *FEVER GREATER THAN 100.4 F (38 C) OR HIGHER *CHILLS OR SWEATING *NAUSEA AND VOMITING THAT IS NOT CONTROLLED WITH YOUR NAUSEA MEDICATION *UNUSUAL SHORTNESS OF BREATH *UNUSUAL BRUISING OR BLEEDING *URINARY PROBLEMS (pain or burning when urinating, or frequent urination) *BOWEL PROBLEMS (unusual diarrhea, constipation, pain near the anus) TENDERNESS IN MOUTH AND THROAT WITH OR WITHOUT PRESENCE OF ULCERS (sore throat, sores in mouth, or a toothache) UNUSUAL RASH, SWELLING OR PAIN  UNUSUAL VAGINAL DISCHARGE OR ITCHING   Items with * indicate a potential emergency and should be followed up as soon as possible or go to the Emergency Department if any problems should occur.  Please show the CHEMOTHERAPY ALERT CARD or  IMMUNOTHERAPY ALERT CARD at check-in to the Emergency Department and triage nurse.  Should you have questions after your visit or need to cancel or reschedule your appointment, please contact [facility] - A DEPT OF [facility]  [phone] and follow the prompts.  Office hours are 8:00 a.m. to 4:30 p.m. Monday - Friday. Please note that voicemails left after 4:00 p.m. may not be returned until the following business day.  We are closed weekends and major holidays. You have access to a nurse at all times for urgent questions. Please call the main number to the clinic [phone] and follow the prompts.  For any non-urgent questions, you may also contact your provider using MyChart. We now offer e-Visits for anyone 25 and older to request care online for non-urgent symptoms. For details visit [email or web address].   Also download the MyChart app! Go to the app store, search MyChart, open the app, select [place], and log in with your MyChart username and password.

## 2024-07-05 NOTE — Progress Notes (Signed)
 Follow-up with primary Hematology/Oncology Progress note [phone] [phone]     CHIEF COMPLAINTS/REASON FOR VISIT:  Follow-up for multiple myeloma  ASSESSMENT & PLAN:   Cancer Staging  Multiple myeloma (HCC) Staging form: Plasma Cell Myeloma and Plasma Cell Disorders, AJCC 8th Edition - Clinical stage from 08/22/2021: RISS Stage II (Beta-2 -microglobulin (mg/L): 4.1, Albumin (g/dL): 2.8, ISS: Stage II, High-risk cytogenetics: Absent, LDH: Normal) - Signed by Babara Call, MD on 09/06/2021   Multiple myeloma (HCC) Multiple myeloma, IgA kappa, complex hyperploid gain of 1q, S/p Daratumumab  Rd.   Revlimid  D1-21,  07/23/22 bone marrow biopsy  3% plasma cell, no M protein.   Labs are reviewed and discussed with patient.  Lab Results  Component Value Date   MPROTEIN 0.5 (H) 06/07/2024   KPAFRELGTCHN 11.8 06/07/2024   LAMBDASER 5.9 06/07/2024   KAPLAMBRATIO 2.00 (H) 06/07/2024     M protein has increased 0.5.  Today's level is pending.  Stable light chain ratio.  Proceed with  Daratumumab  monotherapy maintenance - she takes home supply of Dexamethasone  prior to her Daratumumab .  If M protein progressively increased, consider repeat bone marrow biopsy for further evaluation. On asprin 81mg  daily and Acyclovir  400mg  BID.   Encounter for antineoplastic chemotherapy Chemotherapy plan as listed as above.  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  Lab Results  Component Value Date   HGB 10.1 (L) 07/05/2024   TIBC 427 03/15/2024   IRONPCTSAT 16 03/15/2024   FERRITIN 52 03/15/2024    Recommend patient to take oral iron  supplementation Vitron-C 1 tab daily.   Stage 3a chronic kidney disease (HCC) recommend patient to avoid nephrotoxin.  Encourage oral hydration.  Orders Placed This Encounter  Procedures   Kappa/lambda light chains    Standing Status:   Future    Expected Date:   08/02/2024    Expiration Date:   08/02/2025   Multiple  Myeloma Panel (SPEP&IFE w/QIG)    Standing Status:   Future    Expected Date:   08/02/2024    Expiration Date:   08/02/2025   Comprehensive metabolic panel    Standing Status:   Future    Expected Date:   08/02/2024    Expiration Date:   08/02/2025   CBC with Differential    Standing Status:   Future    Expected Date:   08/02/2024    Expiration Date:   08/02/2025   Kappa/lambda light chains    Standing Status:   Future    Expected Date:   08/30/2024    Expiration Date:   08/30/2025   Multiple Myeloma Panel (SPEP&IFE w/QIG)    Standing Status:   Future    Expected Date:   08/30/2024    Expiration Date:   08/30/2025   Comprehensive metabolic panel    Standing Status:   Future    Expected Date:   08/30/2024    Expiration Date:   08/30/2025   CBC with Differential    Standing Status:   Future    Expected Date:   08/30/2024    Expiration Date:   08/30/2025   Iron  and TIBC    Standing Status:   Future    Expected Date:   08/02/2024    Expiration Date:   08/02/2025   Ferritin    Standing Status:   Future    Expected Date:   08/02/2024    Expiration Date:   08/02/2025     Follow up  Lab/MD/Dara in 4  weeks  All questions were answered. The patient knows to call the clinic with any problems, questions or concerns.  Zelphia Cap, MD, PhD [facility] 07/05/2024     HISTORY OF PRESENTING ILLNESS:   Stephanie Delacruz is a  89 y.o.  female presents for follow up of multiple myeloma.  Oncology History  Multiple myeloma (HCC)  08/22/2021 Initial Diagnosis   Multiple myeloma (HCC) IgA kappa Initial M protein 5.2, free kappa light chain ratio 89.2, Kappa/lambda ratio 13.31   08/22/2021 Cancer Staging   Staging form: Plasma Cell Myeloma and Plasma Cell Disorders, AJCC 8th Edition - Clinical stage from 08/22/2021: RISS Stage II (Beta-2 -microglobulin (mg/L): 4.1, Albumin (g/dL): 2.8, ISS: Stage II, High-risk cytogenetics: Absent, LDH: Normal) - Signed by Cap Zelphia, MD on 09/06/2021 Stage prefix: Initial  diagnosis Beta 2 microglobulin range (mg/L): 3.5 to 5.49 Albumin range (g/dL): Less than 3.5 Cytogenetics: 1q addition   08/26/2021 - 09/01/2022 Chemotherapy   08/26/21 MYELOMA Daratumumab  SQ q28d  09/06/2021 added Revlimid  10mg  x 14 days.  09/27/2021 starting cycle 2 Revlimid  10 mg 21 days on, 7 days off. Weekly Dexamethasone .    08/26/2021 Bone Marrow Biopsy   Bone marrow biopsy showed hypercellular marrow with plasma cell neoplasm. 67% plasma cells in the aspirate. Cytogenetics showed complex hyperplasia, gain of 1 q.    07/23/2022 Bone Marrow Biopsy   Bone marrow biopsy showed Hypercellular bone marrow for age with trilineage hematopoiesis and 3% plasma cells  Cytogenetics are normal.   04/20/2023 Imaging   PET scan showed  1. Patchy hypermetabolism throughout the spine with focal hypermetabolism in the right third rib and left sacrum, compatible with residual metabolically active multiple myeloma. 2. Hypermetabolic 5 mm right cervical lymph node, indeterminate.Recommend attention on follow-up. 3. 3.6 cm thyroid  nodule, previously evaluated by ultrasound 08/27/2022 and biopsy 10/07/2022. Please refer to pathology report for further evaluation. No follow-up recommended unless clinically warranted. (Ref: J Am Coll Radiol. 2015 Feb;12(2): 143-50). 4. Aortic atherosclerosis (ICD10-I70.0). Coronary artery calcification.    S/p thyroid  lesion biopsy, obtained by endocrinologist. + AUS  INTERVAL HISTORY Stephanie Delacruz is a 89 y.o. female who has above history reviewed by me today presents for follow up visit for management of multiple myeloma She was accompanied by her daughter today.  She denies any nausea vomiting diarrhea.  Chronic back pain, stable.  No new complaints.   Review of Systems  Constitutional:  Positive for fatigue. Negative for appetite change, chills and fever.  HENT:   Negative for hearing loss and voice change.   Eyes:  Negative for eye problems.  Respiratory:  Negative  for chest tightness and cough.   Cardiovascular:  Negative for chest pain.  Gastrointestinal:  Negative for abdominal distention, abdominal pain and blood in stool.  Endocrine: Negative for hot flashes.  Genitourinary:  Negative for difficulty urinating and frequency.   Musculoskeletal:  Positive for back pain. Negative for arthralgias.  Skin:  Negative for itching and rash.  Neurological:  Negative for extremity weakness.  Hematological:  Negative for adenopathy.  Psychiatric/Behavioral:  Negative for confusion.     MEDICAL HISTORY:  Past Medical History:  Diagnosis Date   Anemia    GERD (gastroesophageal reflux disease)    Hypercholesterolemia    Hypertension    Renal insufficiency     SURGICAL HISTORY: Past Surgical History:  Procedure Laterality Date   TONSILLECTOMY     TUBAL LIGATION      SOCIAL HISTORY: Social History   Socioeconomic  History   Marital status: Married    Spouse name: Not on file   Number of children: Not on file   Years of education: Not on file   Highest education level: Not on file  Occupational History   Not on file  Tobacco Use   Smoking status: Never   Smokeless tobacco: Never  Vaping Use   Vaping status: Never Used  Substance and Sexual Activity   Alcohol use: No   Drug use: No   Sexual activity: Not on file  Other Topics Concern   Not on file  Social History Narrative   Lives with husband Lynwood and daughter Jon. No pets.    Social Drivers of Corporate Investment Banker Strain: Not on file  Food Insecurity: No Food Insecurity (01/11/2024)   Hunger Vital Sign    Worried About Running Out of Food in the Last Year: Never true    Ran Out of Food in the Last Year: Never true  Transportation Needs: No Transportation Needs (01/11/2024)   PRAPARE - Administrator, Civil Service (Medical): No    Lack of Transportation (Non-Medical): No  Physical Activity: Not on file  Stress: Not on file  Social Connections: Not on file   Intimate Partner Violence: Not At Risk (01/11/2024)   Humiliation, Afraid, Rape, and Kick questionnaire    Fear of Current or Ex-Partner: No    Emotionally Abused: No    Physically Abused: No    Sexually Abused: No    FAMILY HISTORY: Family History  Problem Relation Age of Onset   Hypertension Mother    Arthritis Mother    Hypertension Father    Stroke Father    Breast cancer Niece     ALLERGIES:  has no known allergies.  MEDICATIONS:  Current Outpatient Medications  Medication Sig Dispense Refill   acyclovir  (ZOVIRAX ) 400 MG tablet Take 1 tablet (400 mg total) by mouth 2 (two) times daily. 60 tablet 11   amLODipine  (NORVASC ) 2.5 MG tablet Take 1 tablet (2.5 mg total) by mouth daily.     aspirin 81 MG EC tablet Take 81 mg by mouth daily.     Calcium Carb-Cholecalciferol (CALCIUM 600+D) 600-20 MG-MCG TABS Take 1 tablet by mouth 3 (three) times daily.     dexamethasone  (DECADRON ) 4 MG tablet Take 5 tablets (20 mg total) by mouth See admin instructions. Take 20mg  prior to each Daratumumab  treatment. 30 tablet 1   donepezil  (ARICEPT ) 5 MG tablet Take 10 mg by mouth daily.     fenofibrate  (TRICOR ) 48 MG tablet TAKE 1 TABLET(48 MG) BY MOUTH DAILY 90 tablet 2   fexofenadine (ALLEGRA) 180 MG tablet Take by mouth.     hydrochlorothiazide  (HYDRODIURIL ) 25 MG tablet TAKE 1/2 TABLET BY MOUTH EVERY DAY 45 tablet 3   Iron -Vitamin C  65-125 MG TABS Take 1 tablet by mouth daily. 30 tablet 2   memantine (NAMENDA) 5 MG tablet Take 5 mg by mouth 2 (two) times daily.     metoprolol  succinate (TOPROL -XL) 25 MG 24 hr tablet Take 1 tablet (25 mg total) by mouth daily. 90 tablet 3   montelukast  (SINGULAIR ) 10 MG tablet Take 1 tablet (10 mg total) by mouth See admin instructions. Take 1 tablet one day before Daratumumab  injection. 60 tablet 0   Multiple Vitamins-Minerals (WOMENS MULTIVITAMIN PO) Take 1 tablet by mouth daily.     omeprazole  (PRILOSEC) 20 MG capsule TAKE 1 CAPSULE(20 MG) BY MOUTH DAILY 90  capsule 3  prochlorperazine  (COMPAZINE ) 10 MG tablet Take 10 mg by mouth every 6 (six) hours as needed for nausea or vomiting.     valsartan  (DIOVAN ) 160 MG tablet TAKE 1 TABLET(160 MG) BY MOUTH DAILY 90 tablet 1   methimazole  (TAPAZOLE ) 5 MG tablet Take 1 tablet (5 mg total) by mouth daily. 90 tablet 3   No current facility-administered medications for this visit.     PHYSICAL EXAMINATION: ECOG PERFORMANCE STATUS: 1 - Symptomatic but completely ambulatory Vitals:   07/05/24 0902 07/05/24 0907  BP: (!) 144/66 (!) 142/67  Pulse: 67   Resp: 18   Temp: (!) 96.9 F (36.1 C)   SpO2: 100%    Filed Weights   07/05/24 0902  Weight: 157 lb 12.8 oz (71.6 kg)    Physical Exam Constitutional:      General: She is not in acute distress. HENT:     Head: Normocephalic and atraumatic.  Eyes:     General: No scleral icterus. Cardiovascular:     Rate and Rhythm: Normal rate and regular rhythm.     Heart sounds: Normal heart sounds.  Pulmonary:     Effort: Pulmonary effort is normal. No respiratory distress.     Breath sounds: No wheezing.  Abdominal:     General: Bowel sounds are normal. There is no distension.     Palpations: Abdomen is soft.  Musculoskeletal:        General: No deformity. Normal range of motion.     Cervical back: Normal range of motion and neck supple.  Skin:    General: Skin is warm and dry.     Findings: No erythema or rash.  Neurological:     Mental Status: She is alert and oriented to person, place, and time. Mental status is at baseline.     Cranial Nerves: No cranial nerve deficit.     Coordination: Coordination normal.  Psychiatric:        Mood and Affect: Mood normal.     LABORATORY DATA:  I have reviewed the data as listed    Latest Ref Rng & Units 07/05/2024    8:50 AM 06/07/2024   10:28 AM 05/10/2024    8:53 AM  CBC  WBC 4.0 - 10.5 K/uL 5.6  6.4  5.7   Hemoglobin 12.0 - 15.0 g/dL 89.8  89.3  88.9   Hematocrit 36.0 - 46.0 % 31.1  32.9   33.2   Platelets 150 - 400 K/uL 217  249  220       Latest Ref Rng & Units 07/05/2024    8:50 AM 06/07/2024   10:28 AM 05/10/2024    8:53 AM  CMP  Glucose 70 - 99 mg/dL 95  899  84   BUN 8 - 23 mg/dL 20  19  22    Creatinine 0.44 - 1.00 mg/dL 8.82  8.79  8.83   Sodium 135 - 145 mmol/L 145  139  141   Potassium 3.5 - 5.1 mmol/L 3.7  3.4  3.6   Chloride 98 - 111 mmol/L 110  106  107   CO2 22 - 32 mmol/L 22  22  24    Calcium 8.9 - 10.3 mg/dL 9.7  8.9  9.6   Total Protein 6.5 - 8.1 g/dL 6.4  6.8  6.9   Total Bilirubin 0.0 - 1.2 mg/dL 0.2  0.4  0.5   Alkaline Phos 38 - 126 U/L 54  47  44   AST 15 - 41 U/L 22  29  28   ALT 0 - 44 U/L 15  14  16       Iron /TIBC/Ferritin/ %Sat    Component Value Date/Time   IRON  67 03/15/2024 0903   TIBC 427 03/15/2024 0903   FERRITIN 52 03/15/2024 0903   IRONPCTSAT 16 03/15/2024 0903       RADIOGRAPHIC STUDIES: I have personally reviewed the radiological images as listed and agreed with the findings in the report. No results found.

## 2024-07-05 NOTE — Assessment & Plan Note (Signed)
recommend patient to avoid nephrotoxin.  Encourage oral hydration. 

## 2024-07-05 NOTE — Assessment & Plan Note (Signed)
Chemotherapy plan as listed as above. 

## 2024-07-05 NOTE — Assessment & Plan Note (Addendum)
 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  Lab Results  Component Value Date   HGB 10.1 (L) 07/05/2024   TIBC 427 03/15/2024   IRONPCTSAT 16 03/15/2024   FERRITIN 52 03/15/2024    Recommend patient to take oral iron  supplementation Vitron-C 1 tab daily.

## 2024-07-06 LAB — KAPPA/LAMBDA LIGHT CHAINS
Kappa free light chain: 11.7 mg/L (ref 3.3–19.4)
Kappa, lambda light chain ratio: 2.44 — ABNORMAL HIGH (ref 0.26–1.65)
Lambda free light chains: 4.8 mg/L — ABNORMAL LOW (ref 5.7–26.3)

## 2024-07-07 LAB — MULTIPLE MYELOMA PANEL, SERUM
Albumin SerPl Elph-Mcnc: 3.3 g/dL (ref 2.9–4.4)
Albumin/Glob SerPl: 1.3 (ref 0.7–1.7)
Alpha 1: 0.2 g/dL (ref 0.0–0.4)
Alpha2 Glob SerPl Elph-Mcnc: 0.7 g/dL (ref 0.4–1.0)
B-Globulin SerPl Elph-Mcnc: 1.3 g/dL (ref 0.7–1.3)
Gamma Glob SerPl Elph-Mcnc: 0.4 g/dL (ref 0.4–1.8)
Globulin, Total: 2.6 g/dL (ref 2.2–3.9)
IgA: 260 mg/dL (ref 64–422)
IgG (Immunoglobin G), Serum: 551 mg/dL — ABNORMAL LOW (ref 586–1602)
IgM (Immunoglobulin M), Srm: 22 mg/dL — ABNORMAL LOW (ref 26–217)
M Protein SerPl Elph-Mcnc: 0.3 g/dL — ABNORMAL HIGH
Total Protein ELP: 5.9 g/dL — ABNORMAL LOW (ref 6.0–8.5)

## 2024-07-13 ENCOUNTER — Ambulatory Visit: Admission: RE | Admit: 2024-07-13 | Discharge: 2024-07-13 | Attending: Internal Medicine | Admitting: Internal Medicine

## 2024-07-13 ENCOUNTER — Other Ambulatory Visit: Payer: Self-pay | Admitting: Internal Medicine

## 2024-07-13 DIAGNOSIS — E042 Nontoxic multinodular goiter: Secondary | ICD-10-CM | POA: Insufficient documentation

## 2024-07-23 ENCOUNTER — Other Ambulatory Visit: Payer: Self-pay | Admitting: Internal Medicine

## 2024-08-02 ENCOUNTER — Inpatient Hospital Stay

## 2024-08-02 ENCOUNTER — Inpatient Hospital Stay: Admitting: Oncology

## 2024-08-02 ENCOUNTER — Encounter: Payer: Self-pay | Admitting: Oncology

## 2024-08-02 ENCOUNTER — Inpatient Hospital Stay: Attending: Oncology

## 2024-08-02 VITALS — BP 135/69 | HR 68 | Temp 98.5°F | Resp 18 | Wt 159.7 lb

## 2024-08-02 DIAGNOSIS — C9000 Multiple myeloma not having achieved remission: Secondary | ICD-10-CM | POA: Diagnosis present

## 2024-08-02 DIAGNOSIS — G8929 Other chronic pain: Secondary | ICD-10-CM | POA: Insufficient documentation

## 2024-08-02 DIAGNOSIS — Z8249 Family history of ischemic heart disease and other diseases of the circulatory system: Secondary | ICD-10-CM | POA: Diagnosis not present

## 2024-08-02 DIAGNOSIS — Z79899 Other long term (current) drug therapy: Secondary | ICD-10-CM | POA: Insufficient documentation

## 2024-08-02 DIAGNOSIS — Z5112 Encounter for antineoplastic immunotherapy: Secondary | ICD-10-CM | POA: Insufficient documentation

## 2024-08-02 DIAGNOSIS — N1831 Chronic kidney disease, stage 3a: Secondary | ICD-10-CM

## 2024-08-02 DIAGNOSIS — C9001 Multiple myeloma in remission: Secondary | ICD-10-CM | POA: Diagnosis not present

## 2024-08-02 DIAGNOSIS — Z8261 Family history of arthritis: Secondary | ICD-10-CM | POA: Diagnosis not present

## 2024-08-02 DIAGNOSIS — R5383 Other fatigue: Secondary | ICD-10-CM | POA: Insufficient documentation

## 2024-08-02 DIAGNOSIS — M899 Disorder of bone, unspecified: Secondary | ICD-10-CM | POA: Diagnosis not present

## 2024-08-02 DIAGNOSIS — Z823 Family history of stroke: Secondary | ICD-10-CM | POA: Diagnosis not present

## 2024-08-02 DIAGNOSIS — T451X5A Adverse effect of antineoplastic and immunosuppressive drugs, initial encounter: Secondary | ICD-10-CM | POA: Diagnosis not present

## 2024-08-02 DIAGNOSIS — I10 Essential (primary) hypertension: Secondary | ICD-10-CM | POA: Insufficient documentation

## 2024-08-02 DIAGNOSIS — Z79620 Long term (current) use of immunosuppressive biologic: Secondary | ICD-10-CM | POA: Insufficient documentation

## 2024-08-02 DIAGNOSIS — Z7982 Long term (current) use of aspirin: Secondary | ICD-10-CM | POA: Insufficient documentation

## 2024-08-02 DIAGNOSIS — M549 Dorsalgia, unspecified: Secondary | ICD-10-CM | POA: Diagnosis not present

## 2024-08-02 DIAGNOSIS — Z9089 Acquired absence of other organs: Secondary | ICD-10-CM | POA: Diagnosis not present

## 2024-08-02 DIAGNOSIS — D6481 Anemia due to antineoplastic chemotherapy: Secondary | ICD-10-CM | POA: Diagnosis not present

## 2024-08-02 DIAGNOSIS — I2510 Atherosclerotic heart disease of native coronary artery without angina pectoris: Secondary | ICD-10-CM | POA: Diagnosis not present

## 2024-08-02 DIAGNOSIS — E7800 Pure hypercholesterolemia, unspecified: Secondary | ICD-10-CM | POA: Diagnosis not present

## 2024-08-02 DIAGNOSIS — Z803 Family history of malignant neoplasm of breast: Secondary | ICD-10-CM | POA: Diagnosis not present

## 2024-08-02 DIAGNOSIS — I700 Atherosclerosis of aorta: Secondary | ICD-10-CM | POA: Insufficient documentation

## 2024-08-02 DIAGNOSIS — D649 Anemia, unspecified: Secondary | ICD-10-CM | POA: Diagnosis not present

## 2024-08-02 DIAGNOSIS — Z5111 Encounter for antineoplastic chemotherapy: Secondary | ICD-10-CM

## 2024-08-02 LAB — CBC WITH DIFFERENTIAL/PLATELET
Abs Immature Granulocytes: 0.02 K/uL (ref 0.00–0.07)
Basophils Absolute: 0.1 K/uL (ref 0.0–0.1)
Basophils Relative: 1 %
Eosinophils Absolute: 0.1 K/uL (ref 0.0–0.5)
Eosinophils Relative: 3 %
HCT: 32 % — ABNORMAL LOW (ref 36.0–46.0)
Hemoglobin: 10.5 g/dL — ABNORMAL LOW (ref 12.0–15.0)
Immature Granulocytes: 0 %
Lymphocytes Relative: 35 %
Lymphs Abs: 1.9 K/uL (ref 0.7–4.0)
MCH: 32.1 pg (ref 26.0–34.0)
MCHC: 32.8 g/dL (ref 30.0–36.0)
MCV: 97.9 fL (ref 80.0–100.0)
Monocytes Absolute: 0.3 K/uL (ref 0.1–1.0)
Monocytes Relative: 5 %
Neutro Abs: 3.2 K/uL (ref 1.7–7.7)
Neutrophils Relative %: 56 %
Platelets: 217 K/uL (ref 150–400)
RBC: 3.27 MIL/uL — ABNORMAL LOW (ref 3.87–5.11)
RDW: 12.8 % (ref 11.5–15.5)
WBC: 5.6 K/uL (ref 4.0–10.5)
nRBC: 0 % (ref 0.0–0.2)

## 2024-08-02 LAB — COMPREHENSIVE METABOLIC PANEL WITH GFR
ALT: 15 U/L (ref 0–44)
AST: 23 U/L (ref 15–41)
Albumin: 4.1 g/dL (ref 3.5–5.0)
Alkaline Phosphatase: 50 U/L (ref 38–126)
Anion gap: 11 (ref 5–15)
BUN: 17 mg/dL (ref 8–23)
CO2: 25 mmol/L (ref 22–32)
Calcium: 9.6 mg/dL (ref 8.9–10.3)
Chloride: 107 mmol/L (ref 98–111)
Creatinine, Ser: 1.13 mg/dL — ABNORMAL HIGH (ref 0.44–1.00)
GFR, Estimated: 47 mL/min — ABNORMAL LOW
Glucose, Bld: 89 mg/dL (ref 70–99)
Potassium: 3.9 mmol/L (ref 3.5–5.1)
Sodium: 142 mmol/L (ref 135–145)
Total Bilirubin: 0.3 mg/dL (ref 0.0–1.2)
Total Protein: 6.7 g/dL (ref 6.5–8.1)

## 2024-08-02 LAB — IRON AND TIBC
Iron: 80 ug/dL (ref 28–170)
Saturation Ratios: 18 % (ref 10.4–31.8)
TIBC: 448 ug/dL (ref 250–450)
UIBC: 368 ug/dL

## 2024-08-02 LAB — FERRITIN: Ferritin: 98 ng/mL (ref 11–307)

## 2024-08-02 MED ORDER — DARATUMUMAB-HYALURONIDASE-FIHJ 1800-30000 MG-UT/15ML ~~LOC~~ SOLN
1800.0000 mg | Freq: Once | SUBCUTANEOUS | Status: AC
  Administered 2024-08-02: 1800 mg via SUBCUTANEOUS
  Filled 2024-08-02: qty 15

## 2024-08-02 MED ORDER — DIPHENHYDRAMINE HCL 25 MG PO TABS: 25.0000 mg | ORAL_TABLET | Freq: Once | ORAL | Status: DC

## 2024-08-02 MED ORDER — ACETAMINOPHEN 325 MG PO TABS: 650.0000 mg | ORAL_TABLET | Freq: Once | ORAL | Status: DC

## 2024-08-02 MED ORDER — ZOLEDRONIC ACID 4 MG/5ML IV CONC
3.0000 mg | Freq: Once | INTRAVENOUS | Status: AC
  Administered 2024-08-02: 3 mg via INTRAVENOUS
  Filled 2024-08-02: qty 3.75

## 2024-08-02 NOTE — Assessment & Plan Note (Signed)
recommend patient to avoid nephrotoxin.  Encourage oral hydration. 

## 2024-08-02 NOTE — Assessment & Plan Note (Signed)
 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  Lab Results  Component Value Date   HGB 10.5 (L) 08/02/2024   TIBC 427 03/15/2024   IRONPCTSAT 16 03/15/2024   FERRITIN 52 03/15/2024    Recommend patient to take oral iron  supplementation Vitron-C 1 tab daily.

## 2024-08-02 NOTE — Progress Notes (Signed)
 Patient verbalizes taking Tylenol  and Bendaryl prior to appointment

## 2024-08-02 NOTE — Assessment & Plan Note (Signed)
Chemotherapy plan as listed as above. 

## 2024-08-02 NOTE — Assessment & Plan Note (Signed)
 04/20/23 PET scan whole body,  Patchy hypermetabolism throughout the spine with focal hypermetabolism in the right third rib and left sacrum, compatible with residual metabolically active multiple myeloma.  Continue Q3 months Zometa - today and next due April 2026  Recommend calcium 1200 mg daily.

## 2024-08-02 NOTE — Assessment & Plan Note (Signed)
 Multiple myeloma, IgA kappa, complex hyperploid gain of 1q, S/p Daratumumab  Rd.   Revlimid  D1-21,  07/23/22 bone marrow biopsy  3% plasma cell, no M protein.   Labs are reviewed and discussed with patient.  Lab Results  Component Value Date   MPROTEIN 0.3 (H) 07/05/2024   KPAFRELGTCHN 11.7 07/05/2024   LAMBDASER 4.8 (L) 07/05/2024   KAPLAMBRATIO 2.44 (H) 07/05/2024     M protein decreased from 0.5 to 0.3  Stable light chain ratio.  Proceed with  Daratumumab  monotherapy maintenance - she takes home supply of Dexamethasone  prior to her Daratumumab .  On asprin 81mg  daily and Acyclovir  400mg  BID.

## 2024-08-02 NOTE — Patient Instructions (Addendum)
 Zoledronic  Acid Injection (Cancer) What is this medication? ZOLEDRONIC  ACID (ZOE le dron ik AS id) treats high calcium levels in the blood caused by cancer. It may also be used with chemotherapy to treat weakened bones caused by cancer. It works by slowing down the release of calcium from bones. This lowers calcium levels in your blood. It also makes your bones stronger and less likely to break (fracture). It belongs to a group of medications called bisphosphonates. This medicine may be used for other purposes; ask your health care provider or pharmacist if you have questions. COMMON BRAND NAME(S): Zometa , Zometa  Powder What should I tell my care team before I take this medication? They need to know if you have any of these conditions: Dehydration Dental disease Kidney disease Liver disease Low levels of calcium in the blood Lung or breathing disease, such as asthma Receiving steroids, such as dexamethasone  or prednisone An unusual or allergic reaction to zoledronic  acid, other medications, foods, dyes, or preservatives Pregnant or trying to get pregnant Breast-feeding How should I use this medication? This medication is injected into a vein. It is given by your care team in a hospital or clinic setting. Talk to your care team about the use of this medication in children. Special care may be needed. Overdosage: If you think you have taken too much of this medicine contact a poison control center or emergency room at once. NOTE: This medicine is only for you. Do not share this medicine with others. What if I miss a dose? Keep appointments for follow-up doses. It is important not to miss your dose. Call your care team if you are unable to keep an appointment. What may interact with this medication? Certain antibiotics given by injection Diuretics, such as bumetanide, furosemide NSAIDs, medications for pain and inflammation, such as ibuprofen or naproxen Teriparatide Thalidomide This list  may not describe all possible interactions. Give your health care provider a list of all the medicines, herbs, non-prescription drugs, or dietary supplements you use. Also tell them if you smoke, drink alcohol, or use illegal drugs. Some items may interact with your medicine. What should I watch for while using this medication? Visit your care team for regular checks on your progress. It may be some time before you see the benefit from this medication. Some people who take this medication have severe bone, joint, or muscle pain. This medication may also increase your risk for jaw problems or a broken thigh bone. Tell your care team right away if you have severe pain in your jaw, bones, joints, or muscles. Tell you care team if you have any pain that does not go away or that gets worse. Tell your dentist and dental surgeon that you are taking this medication. You should not have major dental surgery while on this medication. See your dentist to have a dental exam and fix any dental problems before starting this medication. Take good care of your teeth while on this medication. Make sure you see your dentist for regular follow-up appointments. You should make sure you get enough calcium and vitamin D while you are taking this medication. Discuss the foods you eat and the vitamins you take with your care team. Check with your care team if you have severe diarrhea, nausea, and vomiting, or if you sweat a lot. The loss of too much body fluid may make it dangerous for you to take this medication. You may need bloodwork while taking this medication. Talk to your care team if  you wish to become pregnant or think you might be pregnant. This medication can cause serious birth defects. What side effects may I notice from receiving this medication? Side effects that you should report to your care team as soon as possible: Allergic reactions--skin rash, itching, hives, swelling of the face, lips, tongue, or  throat Kidney injury--decrease in the amount of urine, swelling of the ankles, hands, or feet Low calcium level--muscle pain or cramps, confusion, tingling, or numbness in the hands or feet Osteonecrosis of the jaw--pain, swelling, or redness in the mouth, numbness of the jaw, poor healing after dental work, unusual discharge from the mouth, visible bones in the mouth Severe bone, joint, or muscle pain Side effects that usually do not require medical attention (report to your care team if they continue or are bothersome): Constipation Fatigue Fever Loss of appetite Nausea Stomach pain This list may not describe all possible side effects. Call your doctor for medical advice about side effects. You may report side effects to FDA at 1-800-FDA-1088. Where should I keep my medication? This medication is given in a hospital or clinic. It will not be stored at home. NOTE: This sheet is a summary. It may not cover all possible information. If you have questions about this medicine, talk to your doctor, pharmacist, or health care provider.  2024 Elsevier/Gold Standard (2021-09-06 00:00:00)  [facility] - A DEPT OF [place]  Discharge Instructions: Thank you for choosing [facility] to provide your oncology and hematology care.  If you have a lab appointment with the Cancer Center, please go directly to the Cancer Center and check in at the registration area.  Wear comfortable clothing and clothing appropriate for easy access to any Portacath or PICC line.   We strive to give you quality time with your provider. You may need to reschedule your appointment if you arrive late (15 or more minutes).  Arriving late affects you and other patients whose appointments are after yours.  Also, if you miss three or more appointments without notifying the office, you may be dismissed from the clinic at the providers discretion.      For prescription refill  requests, have your pharmacy contact our office and allow 72 hours for refills to be completed.    Today you received the following chemotherapy and/or immunotherapy agents: daratumumab     To help prevent nausea and vomiting after your treatment, we encourage you to take your nausea medication as directed.  BELOW ARE SYMPTOMS THAT SHOULD BE REPORTED IMMEDIATELY: *FEVER GREATER THAN 100.4 F (38 C) OR HIGHER *CHILLS OR SWEATING *NAUSEA AND VOMITING THAT IS NOT CONTROLLED WITH YOUR NAUSEA MEDICATION *UNUSUAL SHORTNESS OF BREATH *UNUSUAL BRUISING OR BLEEDING *URINARY PROBLEMS (pain or burning when urinating, or frequent urination) *BOWEL PROBLEMS (unusual diarrhea, constipation, pain near the anus) TENDERNESS IN MOUTH AND THROAT WITH OR WITHOUT PRESENCE OF ULCERS (sore throat, sores in mouth, or a toothache) UNUSUAL RASH, SWELLING OR PAIN  UNUSUAL VAGINAL DISCHARGE OR ITCHING   Items with * indicate a potential emergency and should be followed up as soon as possible or go to the Emergency Department if any problems should occur.  Please show the CHEMOTHERAPY ALERT CARD or IMMUNOTHERAPY ALERT CARD at check-in to the Emergency Department and triage nurse.  Should you have questions after your visit or need to cancel or reschedule your appointment, please contact [facility] - A DEPT OF [place]  [phone] and follow the prompts.  Office hours are 8:00 a.m. to 4:30 p.m. Monday - Friday. Please note that voicemails left after 4:00 p.m. may not be returned until the following business day.  We are closed weekends and major holidays. You have access to a nurse at all times for urgent questions. Please call the main number to the clinic [phone] and follow the prompts.  For any non-urgent questions, you may also contact your provider using MyChart. We now offer e-Visits for anyone 66 and older to request care online for non-urgent symptoms. For details visit  [email or web address].   Also download the MyChart app! Go to the app store, search MyChart, open the app, select [place], and log in with your MyChart username and password.

## 2024-08-02 NOTE — Progress Notes (Signed)
 " Hematology/Oncology Progress note [phone] [phone]     CHIEF COMPLAINTS/REASON FOR VISIT:  Follow-up for multiple myeloma  ASSESSMENT & PLAN:   Cancer Staging  Multiple myeloma (HCC) Staging form: Plasma Cell Myeloma and Plasma Cell Disorders, AJCC 8th Edition - Clinical stage from 08/22/2021: RISS Stage II (Beta-2 -microglobulin (mg/L): 4.1, Albumin (g/dL): 2.8, ISS: Stage II, High-risk cytogenetics: Absent, LDH: Normal) - Signed by Babara Call, MD on 09/06/2021   Multiple myeloma (HCC) Multiple myeloma, IgA kappa, complex hyperploid gain of 1q, S/p Daratumumab  Rd.   Revlimid  D1-21,  07/23/22 bone marrow biopsy  3% plasma cell, no M protein.   Labs are reviewed and discussed with patient.  Lab Results  Component Value Date   MPROTEIN 0.3 (H) 07/05/2024   KPAFRELGTCHN 11.7 07/05/2024   LAMBDASER 4.8 (L) 07/05/2024   KAPLAMBRATIO 2.44 (H) 07/05/2024     M protein decreased from 0.5 to 0.3  Stable light chain ratio.  Proceed with  Daratumumab  monotherapy maintenance - she takes home supply of Dexamethasone  prior to her Daratumumab .  On asprin 81mg  daily and Acyclovir  400mg  BID.   Encounter for antineoplastic chemotherapy Chemotherapy plan as listed as above.  Bone lesion 04/20/23 PET scan whole body,  Patchy hypermetabolism throughout the spine with focal hypermetabolism in the right third rib and left sacrum, compatible with residual metabolically active multiple myeloma.  Continue Q3 months Zometa - today and next due April 2026  Recommend calcium 1200 mg daily.  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  Lab Results  Component Value Date   HGB 10.5 (L) 08/02/2024   TIBC 427 03/15/2024   IRONPCTSAT 16 03/15/2024   FERRITIN 52 03/15/2024    Recommend patient to take oral iron  supplementation Vitron-C 1 tab daily.   Stage 3a chronic kidney disease (HCC) recommend patient to avoid nephrotoxin.  Encourage oral  hydration.  Orders Placed This Encounter  Procedures   Kappa/lambda light chains    Standing Status:   Future    Expected Date:   09/27/2024    Expiration Date:   09/27/2025   Multiple Myeloma Panel (SPEP&IFE w/QIG)    Standing Status:   Future    Expected Date:   09/27/2024    Expiration Date:   09/27/2025   Comprehensive metabolic panel    Standing Status:   Future    Expected Date:   09/27/2024    Expiration Date:   09/27/2025   CBC with Differential    Standing Status:   Future    Expected Date:   09/27/2024    Expiration Date:   09/27/2025     Follow up  Lab/MD/Dara in 4 weeks  All questions were answered. The patient knows to call the clinic with any problems, questions or concerns.  Call Babara, MD, PhD [facility] Hematology Oncology 08/02/2024     HISTORY OF PRESENTING ILLNESS:   Briyah Wheelwright Seyller is a  89 y.o.  female presents for follow up of multiple myeloma.  Oncology History  Multiple myeloma (HCC)  08/22/2021 Initial Diagnosis   Multiple myeloma (HCC) IgA kappa Initial M protein 5.2, free kappa light chain ratio 89.2, Kappa/lambda ratio 13.31   08/22/2021 Cancer Staging   Staging form: Plasma Cell Myeloma and Plasma Cell Disorders, AJCC 8th Edition - Clinical stage from 08/22/2021: RISS Stage II (Beta-2 -microglobulin (mg/L): 4.1, Albumin (g/dL): 2.8, ISS: Stage II, High-risk cytogenetics: Absent, LDH: Normal) - Signed by Babara Call, MD on 09/06/2021 Stage prefix: Initial diagnosis Beta 2  microglobulin range (mg/L): 3.5 to 5.49 Albumin range (g/dL): Less than 3.5 Cytogenetics: 1q addition   08/26/2021 - 09/01/2022 Chemotherapy   08/26/21 MYELOMA Daratumumab  SQ q28d  09/06/2021 added Revlimid  10mg  x 14 days.  09/27/2021 starting cycle 2 Revlimid  10 mg 21 days on, 7 days off. Weekly Dexamethasone .    08/26/2021 Bone Marrow Biopsy   Bone marrow biopsy showed hypercellular marrow with plasma cell neoplasm. 67% plasma cells in the aspirate. Cytogenetics showed complex hyperplasia, gain of  1 q.    07/23/2022 Bone Marrow Biopsy   Bone marrow biopsy showed Hypercellular bone marrow for age with trilineage hematopoiesis and 3% plasma cells  Cytogenetics are normal.   04/20/2023 Imaging   PET scan showed  1. Patchy hypermetabolism throughout the spine with focal hypermetabolism in the right third rib and left sacrum, compatible with residual metabolically active multiple myeloma. 2. Hypermetabolic 5 mm right cervical lymph node, indeterminate.Recommend attention on follow-up. 3. 3.6 cm thyroid  nodule, previously evaluated by ultrasound 08/27/2022 and biopsy 10/07/2022. Please refer to pathology report for further evaluation. No follow-up recommended unless clinically warranted. (Ref: J Am Coll Radiol. 2015 Feb;12(2): 143-50). 4. Aortic atherosclerosis (ICD10-I70.0). Coronary artery calcification.    S/p thyroid  lesion biopsy, obtained by endocrinologist. + AUS  INTERVAL HISTORY Shalika L Dunkerson is a 89 y.o. female who has above history reviewed by me today presents for follow up visit for management of multiple myeloma She was accompanied by her daughter today.  She denies any nausea vomiting diarrhea.  Chronic back pain, stable.  No new complaints.   Review of Systems  Constitutional:  Positive for fatigue. Negative for appetite change, chills and fever.  HENT:   Negative for hearing loss and voice change.   Eyes:  Negative for eye problems.  Respiratory:  Negative for chest tightness and cough.   Cardiovascular:  Negative for chest pain.  Gastrointestinal:  Negative for abdominal distention, abdominal pain and blood in stool.  Endocrine: Negative for hot flashes.  Genitourinary:  Negative for difficulty urinating and frequency.   Musculoskeletal:  Positive for back pain. Negative for arthralgias.  Skin:  Negative for itching and rash.  Neurological:  Negative for extremity weakness.  Hematological:  Negative for adenopathy.  Psychiatric/Behavioral:  Negative for  confusion.     MEDICAL HISTORY:  Past Medical History:  Diagnosis Date   Anemia    GERD (gastroesophageal reflux disease)    Hypercholesterolemia    Hypertension    Renal insufficiency     SURGICAL HISTORY: Past Surgical History:  Procedure Laterality Date   TONSILLECTOMY     TUBAL LIGATION      SOCIAL HISTORY: Social History   Socioeconomic History   Marital status: Married    Spouse name: Not on file   Number of children: Not on file   Years of education: Not on file   Highest education level: Not on file  Occupational History   Not on file  Tobacco Use   Smoking status: Never   Smokeless tobacco: Never  Vaping Use   Vaping status: Never Used  Substance and Sexual Activity   Alcohol use: No   Drug use: No   Sexual activity: Not on file  Other Topics Concern   Not on file  Social History Narrative   Lives with husband Lynwood and daughter Jon. No pets.    Social Drivers of Health   Tobacco Use: Low Risk (08/02/2024)   Patient History    Smoking Tobacco Use: Never  Smokeless Tobacco Use: Never    Passive Exposure: Not on file  Financial Resource Strain: Not on file  Food Insecurity: No Food Insecurity (01/11/2024)   Epic    Worried About Programme Researcher, Broadcasting/film/video in the Last Year: Never true    Ran Out of Food in the Last Year: Never true  Transportation Needs: No Transportation Needs (01/11/2024)   Epic    Lack of Transportation (Medical): No    Lack of Transportation (Non-Medical): No  Physical Activity: Not on file  Stress: Not on file  Social Connections: Not on file  Intimate Partner Violence: Not At Risk (01/11/2024)   Epic    Fear of Current or Ex-Partner: No    Emotionally Abused: No    Physically Abused: No    Sexually Abused: No  Depression (PHQ2-9): Low Risk (04/12/2024)   Depression (PHQ2-9)    PHQ-2 Score: 0  Alcohol Screen: Not on file  Housing: Unknown (01/11/2024)   Epic    Unable to Pay for Housing in the Last Year: No    Number of  Times Moved in the Last Year: Not on file    Homeless in the Last Year: No  Utilities: Not At Risk (01/11/2024)   Epic    Threatened with loss of utilities: No  Health Literacy: Not on file    FAMILY HISTORY: Family History  Problem Relation Age of Onset   Hypertension Mother    Arthritis Mother    Hypertension Father    Stroke Father    Breast cancer Niece     ALLERGIES:  has no known allergies.  MEDICATIONS:  Current Outpatient Medications  Medication Sig Dispense Refill   acetaminophen  (TYLENOL ) 325 MG tablet Take 650 mg by mouth every 30 (thirty) days. 650 mg monthly on the day of treatment     acyclovir  (ZOVIRAX ) 400 MG tablet Take 1 tablet (400 mg total) by mouth 2 (two) times daily. 60 tablet 11   amLODipine  (NORVASC ) 2.5 MG tablet TAKE 2 TABLETS(5 MG) BY MOUTH DAILY 180 tablet 0   aspirin 81 MG EC tablet Take 81 mg by mouth daily.     Calcium Carb-Cholecalciferol (CALCIUM 600+D) 600-20 MG-MCG TABS Take 1 tablet by mouth 2 (two) times daily. 2 tabs in AM,  1 tab PM     dexamethasone  (DECADRON ) 4 MG tablet Take 5 tablets (20 mg total) by mouth See admin instructions. Take 20mg  prior to each Daratumumab  treatment. 30 tablet 1   diphenhydrAMINE  (BENADRYL ) 25 MG tablet Take 25 mg by mouth every 30 (thirty) days. Monthly on the day of treatment     donepezil  (ARICEPT ) 5 MG tablet Take 10 mg by mouth daily. (Patient taking differently: Take 5 mg by mouth daily.)     fenofibrate  (TRICOR ) 48 MG tablet TAKE 1 TABLET(48 MG) BY MOUTH DAILY 90 tablet 3   fexofenadine (ALLEGRA) 180 MG tablet Take 180 mg by mouth daily as needed.     hydrochlorothiazide  (HYDRODIURIL ) 25 MG tablet TAKE 1/2 TABLET BY MOUTH EVERY DAY 45 tablet 3   Iron -Vitamin C  65-125 MG TABS Take 1 tablet by mouth daily. 30 tablet 2   memantine (NAMENDA) 5 MG tablet Take 5 mg by mouth 2 (two) times daily.     methimazole  (TAPAZOLE ) 5 MG tablet Take 1 tablet (5 mg total) by mouth daily. 90 tablet 3   metoprolol  succinate  (TOPROL -XL) 25 MG 24 hr tablet Take 1 tablet (25 mg total) by mouth daily. 90 tablet 3  montelukast  (SINGULAIR ) 10 MG tablet Take 1 tablet (10 mg total) by mouth See admin instructions. Take 1 tablet one day before Daratumumab  injection. 60 tablet 0   Multiple Vitamins-Minerals (WOMENS MULTIVITAMIN PO) Take 1 tablet by mouth daily.     omeprazole  (PRILOSEC) 20 MG capsule TAKE 1 CAPSULE(20 MG) BY MOUTH DAILY 90 capsule 3   prochlorperazine  (COMPAZINE ) 10 MG tablet Take 10 mg by mouth every 6 (six) hours as needed for nausea or vomiting.     valsartan  (DIOVAN ) 160 MG tablet TAKE 1 TABLET(160 MG) BY MOUTH DAILY 90 tablet 1   Wheat Dextrin (BENEFIBER PO) Take by mouth daily.     No current facility-administered medications for this visit.   Facility-Administered Medications Ordered in Other Visits  Medication Dose Route Frequency Provider Last Rate Last Admin   acetaminophen  (TYLENOL ) tablet 650 mg  650 mg Oral Once Babara Call, MD       daratumumab -hyaluronidase -fihj (DARZALEX  FASPRO) 1800-30000 MG-UT/15ML chemo SQ injection 1,800 mg  1,800 mg Subcutaneous Once Teralyn Mullins, MD       diphenhydrAMINE  (BENADRYL ) tablet 25 mg  25 mg Oral Once Emmaleigh Longo, MD       zoledronic  acid (ZOMETA ) 3 mg in sodium chloride  0.9 % 100 mL IVPB  3 mg Intravenous Once Babara Call, MD         PHYSICAL EXAMINATION: ECOG PERFORMANCE STATUS: 1 - Symptomatic but completely ambulatory Vitals:   08/02/24 0905  BP: 135/69  Pulse: 68  Resp: 18  Temp: 98.5 F (36.9 C)   Filed Weights   08/02/24 0905  Weight: 159 lb 11.2 oz (72.4 kg)    Physical Exam Constitutional:      General: She is not in acute distress. HENT:     Head: Normocephalic and atraumatic.  Eyes:     General: No scleral icterus. Cardiovascular:     Rate and Rhythm: Normal rate and regular rhythm.     Heart sounds: Normal heart sounds.  Pulmonary:     Effort: Pulmonary effort is normal. No respiratory distress.     Breath sounds: No wheezing.   Abdominal:     General: Bowel sounds are normal. There is no distension.     Palpations: Abdomen is soft.  Musculoskeletal:        General: No deformity. Normal range of motion.     Cervical back: Normal range of motion and neck supple.  Skin:    General: Skin is warm and dry.     Findings: No erythema or rash.  Neurological:     Mental Status: She is alert and oriented to person, place, and time. Mental status is at baseline.     Cranial Nerves: No cranial nerve deficit.     Coordination: Coordination normal.  Psychiatric:        Mood and Affect: Mood normal.     LABORATORY DATA:  I have reviewed the data as listed    Latest Ref Rng & Units 08/02/2024    8:51 AM 07/05/2024    8:50 AM 06/07/2024   10:28 AM  CBC  WBC 4.0 - 10.5 K/uL 5.6  5.6  6.4   Hemoglobin 12.0 - 15.0 g/dL 89.4  89.8  89.3   Hematocrit 36.0 - 46.0 % 32.0  31.1  32.9   Platelets 150 - 400 K/uL 217  217  249       Latest Ref Rng & Units 08/02/2024    8:51 AM 07/05/2024    8:50 AM 06/07/2024  10:28 AM  CMP  Glucose 70 - 99 mg/dL 89  95  899   BUN 8 - 23 mg/dL 17  20  19    Creatinine 0.44 - 1.00 mg/dL 8.86  8.82  8.79   Sodium 135 - 145 mmol/L 142  145  139   Potassium 3.5 - 5.1 mmol/L 3.9  3.7  3.4   Chloride 98 - 111 mmol/L 107  110  106   CO2 22 - 32 mmol/L 25  22  22    Calcium 8.9 - 10.3 mg/dL 9.6  9.7  8.9   Total Protein 6.5 - 8.1 g/dL 6.7  6.4  6.8   Total Bilirubin 0.0 - 1.2 mg/dL 0.3  0.2  0.4   Alkaline Phos 38 - 126 U/L 50  54  47   AST 15 - 41 U/L 23  22  29    ALT 0 - 44 U/L 15  15  14       Iron /TIBC/Ferritin/ %Sat    Component Value Date/Time   IRON  80 08/02/2024 0851   TIBC 448 08/02/2024 0851   FERRITIN 98 08/02/2024 0851   IRONPCTSAT 18 08/02/2024 0851       RADIOGRAPHIC STUDIES: I have personally reviewed the radiological images as listed and agreed with the findings in the report. US  THYROID  Result Date: 07/13/2024 CLINICAL DATA:  Goiter. History of multinodular thyroid   gland. Patient previously underwent biopsy of isthmic and left mid thyroid  nodules in March of 2024 EXAM: THYROID  ULTRASOUND TECHNIQUE: Ultrasound examination of the thyroid  gland and adjacent soft tissues was performed. COMPARISON:  Prior thyroid  ultrasound 08/27/2022 FINDINGS: Parenchymal Echotexture: Mildly heterogenous Isthmus: 1.2 cm Right lobe: 3.9 x 2.4 x 2.1 cm Left lobe: 4.6 x 1.6 x 1.9 cm _________________________________________________________ Estimated total number of nodules >/= 1 cm: 2 Number of spongiform nodules >/=  2 cm not described below (TR1): 0 Number of mixed cystic and solid nodules >/= 1.5 cm not described below (TR2): 0 _________________________________________________________ Nodule # 2: Large predominantly solid isoechoic nodule in the thyroid  isthmus measures approximately 4.0 x 3.2 x 4.0 cm. Slightly increased internal cystic degeneration compared to prior. Nodule # 4: No significant interval change in the size or appearance of the solid isoechoic nodule in the left mid gland at 2.8 x 1.7 x 2.3 cm. Heterogeneous background thyroid  gland containing multiple subcentimeter nodules. None of these meets criteria to recommend biopsy or imaging surveillance. IMPRESSION: 1. Similar appearance of previously biopsied isthmic thyroid  nodule. The lesion has increased in size slightly due to an increase in internal cystic degeneration. No significant increase in the solid component. 2. Stable previously biopsied nodule in the left mid gland. 3. No new nodules or suspicious features. The above is in keeping with the ACR TI-RADS recommendations - J Am Coll Radiol 2017;14:587-595. Electronically Signed   By: Wilkie Lent M.D.   On: 07/13/2024 11:58     "

## 2024-08-02 NOTE — Progress Notes (Signed)
 Pt here for follow up and Tx. MAR update with patient list

## 2024-08-03 LAB — KAPPA/LAMBDA LIGHT CHAINS
Kappa free light chain: 11.4 mg/L (ref 3.3–19.4)
Kappa, lambda light chain ratio: 1.68 — ABNORMAL HIGH (ref 0.26–1.65)
Lambda free light chains: 6.8 mg/L (ref 5.7–26.3)

## 2024-08-04 LAB — MULTIPLE MYELOMA PANEL, SERUM
Albumin SerPl Elph-Mcnc: 3.3 g/dL (ref 2.9–4.4)
Albumin/Glob SerPl: 1.2 (ref 0.7–1.7)
Alpha 1: 0.3 g/dL (ref 0.0–0.4)
Alpha2 Glob SerPl Elph-Mcnc: 0.8 g/dL (ref 0.4–1.0)
B-Globulin SerPl Elph-Mcnc: 1.5 g/dL — ABNORMAL HIGH (ref 0.7–1.3)
Gamma Glob SerPl Elph-Mcnc: 0.5 g/dL (ref 0.4–1.8)
Globulin, Total: 3 g/dL (ref 2.2–3.9)
IgA: 307 mg/dL (ref 64–422)
IgG (Immunoglobin G), Serum: 534 mg/dL — ABNORMAL LOW (ref 586–1602)
IgM (Immunoglobulin M), Srm: 23 mg/dL — ABNORMAL LOW (ref 26–217)
M Protein SerPl Elph-Mcnc: 0.6 g/dL — ABNORMAL HIGH
Total Protein ELP: 6.3 g/dL (ref 6.0–8.5)

## 2024-08-10 ENCOUNTER — Encounter: Admitting: Internal Medicine

## 2024-08-10 DIAGNOSIS — E7800 Pure hypercholesterolemia, unspecified: Secondary | ICD-10-CM

## 2024-08-10 DIAGNOSIS — E042 Nontoxic multinodular goiter: Secondary | ICD-10-CM

## 2024-08-10 DIAGNOSIS — R739 Hyperglycemia, unspecified: Secondary | ICD-10-CM

## 2024-08-10 DIAGNOSIS — N1831 Chronic kidney disease, stage 3a: Secondary | ICD-10-CM

## 2024-08-17 ENCOUNTER — Ambulatory Visit: Admitting: Internal Medicine

## 2024-08-17 ENCOUNTER — Encounter: Payer: Self-pay | Admitting: Internal Medicine

## 2024-08-17 VITALS — BP 126/74 | HR 65 | Temp 97.6°F | Ht 64.0 in | Wt 158.2 lb

## 2024-08-17 DIAGNOSIS — C9001 Multiple myeloma in remission: Secondary | ICD-10-CM

## 2024-08-17 DIAGNOSIS — R739 Hyperglycemia, unspecified: Secondary | ICD-10-CM

## 2024-08-17 DIAGNOSIS — Z0000 Encounter for general adult medical examination without abnormal findings: Secondary | ICD-10-CM

## 2024-08-17 DIAGNOSIS — E042 Nontoxic multinodular goiter: Secondary | ICD-10-CM | POA: Diagnosis not present

## 2024-08-17 DIAGNOSIS — R55 Syncope and collapse: Secondary | ICD-10-CM

## 2024-08-17 DIAGNOSIS — F0390 Unspecified dementia without behavioral disturbance: Secondary | ICD-10-CM | POA: Diagnosis not present

## 2024-08-17 DIAGNOSIS — N1831 Chronic kidney disease, stage 3a: Secondary | ICD-10-CM

## 2024-08-17 DIAGNOSIS — I10 Essential (primary) hypertension: Secondary | ICD-10-CM | POA: Diagnosis not present

## 2024-08-17 DIAGNOSIS — Z862 Personal history of diseases of the blood and blood-forming organs and certain disorders involving the immune mechanism: Secondary | ICD-10-CM | POA: Diagnosis not present

## 2024-08-17 DIAGNOSIS — E7800 Pure hypercholesterolemia, unspecified: Secondary | ICD-10-CM | POA: Diagnosis not present

## 2024-08-17 LAB — HEPATIC FUNCTION PANEL
ALT: 14 U/L (ref 3–35)
AST: 19 U/L (ref 5–37)
Albumin: 4 g/dL (ref 3.5–5.2)
Alkaline Phosphatase: 41 U/L (ref 39–117)
Bilirubin, Direct: 0.1 mg/dL (ref 0.1–0.3)
Total Bilirubin: 0.3 mg/dL (ref 0.2–1.2)
Total Protein: 6.5 g/dL (ref 6.0–8.3)

## 2024-08-17 LAB — LIPID PANEL
Cholesterol: 222 mg/dL — ABNORMAL HIGH (ref 28–200)
HDL: 76.7 mg/dL
LDL Cholesterol: 132 mg/dL — ABNORMAL HIGH (ref 10–99)
NonHDL: 145.68
Total CHOL/HDL Ratio: 3
Triglycerides: 66 mg/dL (ref 10.0–149.0)
VLDL: 13.2 mg/dL (ref 0.0–40.0)

## 2024-08-17 LAB — BASIC METABOLIC PANEL WITH GFR
BUN: 17 mg/dL (ref 6–23)
CO2: 29 meq/L (ref 19–32)
Calcium: 9.9 mg/dL (ref 8.4–10.5)
Chloride: 109 meq/L (ref 96–112)
Creatinine, Ser: 1.12 mg/dL (ref 0.40–1.20)
GFR: 43.87 mL/min — ABNORMAL LOW
Glucose, Bld: 70 mg/dL (ref 70–99)
Potassium: 4.2 meq/L (ref 3.5–5.1)
Sodium: 143 meq/L (ref 135–145)

## 2024-08-17 LAB — HEMOGLOBIN A1C: Hgb A1c MFr Bld: 5.9 % (ref 4.6–6.5)

## 2024-08-17 MED ORDER — HYDROCHLOROTHIAZIDE 25 MG PO TABS: 12.5000 mg | ORAL_TABLET | Freq: Every day | ORAL | 1 refills | Status: AC

## 2024-08-17 MED ORDER — AMLODIPINE BESYLATE 2.5 MG PO TABS: 2.5000 mg | ORAL_TABLET | Freq: Every day | ORAL | 0 refills | Status: AC

## 2024-08-17 MED ORDER — VALSARTAN 160 MG PO TABS: 160.0000 mg | ORAL_TABLET | Freq: Every day | ORAL | 1 refills | Status: AC

## 2024-08-17 NOTE — Assessment & Plan Note (Signed)
 Physical today 08/17/24.  PET - 03/31/23

## 2024-08-17 NOTE — Progress Notes (Signed)
 "  Subjective:    Patient ID: Stephanie Delacruz, female    DOB: March 18, 1934, 89 y.o.   MRN: [id number]  Patient here for  Chief Complaint  Patient presents with   Medical Management of Chronic Issues   Annual Exam    HPI Here for physical exam. Accompanied by her daughter. History obtained from both of them. Saw neurology 08/05/24 - cognitive impairment due to alzheimers disease. Continue donepezil  and memantine. Overall feels stable. Consider hearing aids. Seeing Dr Babara 08/02/24 - f/u multiple myeloma - continues daratumumab  monotherapy. Saw endocrinology 07/04/24 - f/u multinodular goiter. Repeat thyroid  ultrasound - ok - no new nodules or worrisome features. Had f/u with cardiology 05/27/14 - f/u presyncope/syncope - improved/resolved with reducing toprol  XL and amlodipine . Cardiac monitor and echo unrevealing. Discussed hearing. Has discussed hearing aids.    Past Medical History:  Diagnosis Date   Anemia    GERD (gastroesophageal reflux disease)    Hypercholesterolemia    Hypertension    Renal insufficiency    Past Surgical History:  Procedure Laterality Date   TONSILLECTOMY     TUBAL LIGATION     Family History  Problem Relation Age of Onset   Hypertension Mother    Arthritis Mother    Hypertension Father    Stroke Father    Breast cancer Niece    Social History   Socioeconomic History   Marital status: Married    Spouse name: Not on file   Number of children: Not on file   Years of education: Not on file   Highest education level: Not on file  Occupational History   Not on file  Tobacco Use   Smoking status: Never   Smokeless tobacco: Never  Vaping Use   Vaping status: Never Used  Substance and Sexual Activity   Alcohol use: No   Drug use: No   Sexual activity: Not on file  Other Topics Concern   Not on file  Social History Narrative   Lives with husband Lynwood and daughter Jon. No pets.    Social Drivers of Health   Tobacco Use: Low Risk (08/17/2024)    Patient History    Smoking Tobacco Use: Never    Smokeless Tobacco Use: Never    Passive Exposure: Not on file  Financial Resource Strain: Not on file  Food Insecurity: No Food Insecurity (01/11/2024)   Epic    Worried About Programme Researcher, Broadcasting/film/video in the Last Year: Never true    Ran Out of Food in the Last Year: Never true  Transportation Needs: No Transportation Needs (01/11/2024)   Epic    Lack of Transportation (Medical): No    Lack of Transportation (Non-Medical): No  Physical Activity: Not on file  Stress: Not on file  Social Connections: Not on file  Depression (PHQ2-9): Low Risk (04/12/2024)   Depression (PHQ2-9)    PHQ-2 Score: 0  Alcohol Screen: Not on file  Housing: Unknown (01/11/2024)   Epic    Unable to Pay for Housing in the Last Year: No    Number of Times Moved in the Last Year: Not on file    Homeless in the Last Year: No  Utilities: Not At Risk (01/11/2024)   Epic    Threatened with loss of utilities: No  Health Literacy: Not on file     Review of Systems  Constitutional:  Negative for appetite change and unexpected weight change.  HENT:  Negative for congestion, sinus pressure and sore throat.  Eyes:  Negative for pain and visual disturbance.  Respiratory:  Negative for cough, chest tightness and shortness of breath.   Cardiovascular:  Negative for chest pain, palpitations and leg swelling.  Gastrointestinal:  Negative for abdominal pain, diarrhea, nausea and vomiting.  Genitourinary:  Negative for difficulty urinating and dysuria.  Musculoskeletal:  Negative for joint swelling and myalgias.  Skin:  Negative for color change and rash.  Neurological:  Negative for dizziness and headaches.  Hematological:  Negative for adenopathy. Does not bruise/bleed easily.  Psychiatric/Behavioral:  Negative for agitation and dysphoric mood.        Objective:     BP 126/74   Pulse 65   Temp 97.6 F (36.4 C) (Oral)   Ht 5' 4 (1.626 m)   Wt 158 lb 3.2 oz (71.8 kg)    SpO2 99%   BMI 27.15 kg/m  Wt Readings from Last 3 Encounters:  08/17/24 158 lb 3.2 oz (71.8 kg)  08/02/24 159 lb 11.2 oz (72.4 kg)  07/05/24 157 lb 12.8 oz (71.6 kg)    Physical Exam Vitals reviewed.  Constitutional:      General: She is not in acute distress.    Appearance: Normal appearance.  HENT:     Head: Normocephalic and atraumatic.     Right Ear: External ear normal.     Left Ear: External ear normal.     Mouth/Throat:     Pharynx: No oropharyngeal exudate or posterior oropharyngeal erythema.  Eyes:     General: No scleral icterus.       Right eye: No discharge.        Left eye: No discharge.     Conjunctiva/sclera: Conjunctivae normal.  Neck:     Thyroid : No thyromegaly.  Cardiovascular:     Rate and Rhythm: Normal rate and regular rhythm.  Pulmonary:     Effort: No respiratory distress.     Breath sounds: Normal breath sounds. No wheezing.  Abdominal:     General: Bowel sounds are normal.     Palpations: Abdomen is soft.     Tenderness: There is no abdominal tenderness.  Musculoskeletal:        General: No swelling or tenderness.     Cervical back: Neck supple. No tenderness.  Lymphadenopathy:     Cervical: No cervical adenopathy.  Skin:    Findings: No erythema or rash.  Neurological:     Mental Status: She is alert.  Psychiatric:        Mood and Affect: Mood normal.        Behavior: Behavior normal.         Outpatient Encounter Medications as of 08/17/2024  Medication Sig   acetaminophen  (TYLENOL ) 325 MG tablet Take 650 mg by mouth every 30 (thirty) days. 650 mg monthly on the day of treatment   acyclovir  (ZOVIRAX ) 400 MG tablet Take 1 tablet (400 mg total) by mouth 2 (two) times daily.   amLODipine  (NORVASC ) 2.5 MG tablet Take 1 tablet (2.5 mg total) by mouth daily.   aspirin 81 MG EC tablet Take 81 mg by mouth daily.   Calcium  Carb-Cholecalciferol (CALCIUM  600+D) 600-20 MG-MCG TABS Take 1 tablet by mouth 2 (two) times daily. 2 tabs in AM,  1  tab PM   dexamethasone  (DECADRON ) 4 MG tablet Take 5 tablets (20 mg total) by mouth See admin instructions. Take 20mg  prior to each Daratumumab  treatment.   diphenhydrAMINE  (BENADRYL ) 25 MG tablet Take 25 mg by mouth every 30 (thirty) days. Monthly  on the day of treatment   donepezil  (ARICEPT ) 5 MG tablet Take 10 mg by mouth daily. (Patient taking differently: Take 5 mg by mouth daily.)   fenofibrate  (TRICOR ) 48 MG tablet TAKE 1 TABLET(48 MG) BY MOUTH DAILY   fexofenadine (ALLEGRA) 180 MG tablet Take 180 mg by mouth daily as needed.   hydrochlorothiazide  (HYDRODIURIL ) 25 MG tablet Take 0.5 tablets (12.5 mg total) by mouth daily.   Iron -Vitamin C  65-125 MG TABS Take 1 tablet by mouth daily.   memantine (NAMENDA) 5 MG tablet Take 5 mg by mouth 2 (two) times daily.   methimazole  (TAPAZOLE ) 5 MG tablet Take 1 tablet (5 mg total) by mouth daily.   metoprolol  succinate (TOPROL -XL) 25 MG 24 hr tablet Take 1 tablet (25 mg total) by mouth daily.   montelukast  (SINGULAIR ) 10 MG tablet Take 1 tablet (10 mg total) by mouth See admin instructions. Take 1 tablet one day before Daratumumab  injection.   Multiple Vitamins-Minerals (WOMENS MULTIVITAMIN PO) Take 1 tablet by mouth daily.   omeprazole  (PRILOSEC) 20 MG capsule TAKE 1 CAPSULE(20 MG) BY MOUTH DAILY   prochlorperazine  (COMPAZINE ) 10 MG tablet Take 10 mg by mouth every 6 (six) hours as needed for nausea or vomiting.   valsartan  (DIOVAN ) 160 MG tablet Take 1 tablet (160 mg total) by mouth daily.   Wheat Dextrin (BENEFIBER PO) Take by mouth daily.   [DISCONTINUED] amLODipine  (NORVASC ) 2.5 MG tablet TAKE 2 TABLETS(5 MG) BY MOUTH DAILY   [DISCONTINUED] hydrochlorothiazide  (HYDRODIURIL ) 25 MG tablet TAKE 1/2 TABLET BY MOUTH EVERY DAY   [DISCONTINUED] valsartan  (DIOVAN ) 160 MG tablet TAKE 1 TABLET(160 MG) BY MOUTH DAILY   No facility-administered encounter medications on file as of 08/17/2024.     Lab Results  Component Value Date   WBC 5.6 08/02/2024    HGB 10.5 (L) 08/02/2024   HCT 32.0 (L) 08/02/2024   PLT 217 08/02/2024   GLUCOSE 70 08/17/2024   CHOL 222 (H) 08/17/2024   TRIG 66.0 08/17/2024   HDL 76.70 08/17/2024   LDLCALC 132 (H) 08/17/2024   ALT 14 08/17/2024   AST 19 08/17/2024   NA 143 08/17/2024   K 4.2 08/17/2024   CL 109 08/17/2024   CREATININE 1.12 08/17/2024   BUN 17 08/17/2024   CO2 29 08/17/2024   TSH 2.32 07/04/2024   HGBA1C 5.9 08/17/2024    US  THYROID  Result Date: 07/13/2024 CLINICAL DATA:  Goiter. History of multinodular thyroid  gland. Patient previously underwent biopsy of isthmic and left mid thyroid  nodules in March of 2024 EXAM: THYROID  ULTRASOUND TECHNIQUE: Ultrasound examination of the thyroid  gland and adjacent soft tissues was performed. COMPARISON:  Prior thyroid  ultrasound 08/27/2022 FINDINGS: Parenchymal Echotexture: Mildly heterogenous Isthmus: 1.2 cm Right lobe: 3.9 x 2.4 x 2.1 cm Left lobe: 4.6 x 1.6 x 1.9 cm _________________________________________________________ Estimated total number of nodules >/= 1 cm: 2 Number of spongiform nodules >/=  2 cm not described below (TR1): 0 Number of mixed cystic and solid nodules >/= 1.5 cm not described below (TR2): 0 _________________________________________________________ Nodule # 2: Large predominantly solid isoechoic nodule in the thyroid  isthmus measures approximately 4.0 x 3.2 x 4.0 cm. Slightly increased internal cystic degeneration compared to prior. Nodule # 4: No significant interval change in the size or appearance of the solid isoechoic nodule in the left mid gland at 2.8 x 1.7 x 2.3 cm. Heterogeneous background thyroid  gland containing multiple subcentimeter nodules. None of these meets criteria to recommend biopsy or imaging surveillance. IMPRESSION: 1. Similar appearance  of previously biopsied isthmic thyroid  nodule. The lesion has increased in size slightly due to an increase in internal cystic degeneration. No significant increase in the solid component.  2. Stable previously biopsied nodule in the left mid gland. 3. No new nodules or suspicious features. The above is in keeping with the ACR TI-RADS recommendations - J Am Coll Radiol 2017;14:587-595. Electronically Signed   By: Wilkie Lent M.D.   On: 07/13/2024 11:58       Assessment & Plan:  Routine general medical examination at a health care facility  Stage 3a chronic kidney disease [facility]) Assessment & Plan: Avoid antiinflammatories.  Stay hydrated.  Follow metabolic panel.   Orders: -     Basic metabolic panel with GFR  Multinodular goiter Assessment & Plan: Saw endocrinology 07/04/24 - f/u multinodular goiter. Repeat thyroid  ultrasound - ok - no new nodules or worrisome features.    Pure hypercholesterolemia Assessment & Plan: Continue tricor . Follow lipid panel.   Orders: -     Lipid panel -     Hepatic function panel  Hyperglycemia Assessment & Plan: Follow met b and A1c.   Orders: -     Hemoglobin A1c  Healthcare maintenance Assessment & Plan: Physical today 08/17/24.  PET - 03/31/23   Dementia without behavioral disturbance, psychotic disturbance, mood disturbance, or anxiety, unspecified dementia severity, unspecified dementia type (HCC) Assessment & Plan: Continues aricept  and namenda. Continues f/u with neurology. Overall stable.    Essential (primary) hypertension Assessment & Plan: Blood pressure as outlined.  Continue amlodipine , metoprolol , hydrochlorothiazide .  Follow pressures. Follow metabolic panel. No change in medication.    History of anemia Assessment & Plan: Followed by hematology.    Multiple myeloma in remission [facility]) Assessment & Plan: Seeing Dr Babara 08/02/24 - f/u multiple myeloma - continues daratumumab  monotherapy.   Near syncope Assessment & Plan:  Had f/u with cardiology 06/05/23 - f/u regarding syncope. Recommended monitor and echo. Toprol  dose reduced to 25mg  q day. Cardiac monitor showed no arrhythmias. Minimal heart rate 52,  max heart rate 113, average heart rate 67. Overall benign cardiac monitor ECHO 06/29/23 - EF 60-65% with grade I DD, moderate elevated PAP, mild to moderate MR and mild to moderate AR. Discussed - per cardiology - no findings to suggest etiology of syncope. Had the brief episode as outlined - Thanksgiving.  Instructed to stay hydrated. Compression hose. Toprol  decreased. Had f/u with cardiology 05/27/14 - f/u presyncope/syncope - improved/resolved with reducing toprol  XL and amlodipine . Cardiac monitor and echo unrevealing.    Other orders -     amLODIPine  Besylate; Take 1 tablet (2.5 mg total) by mouth daily.  Dispense: 180 tablet; Refill: 0 -     hydroCHLOROthiazide ; Take 0.5 tablets (12.5 mg total) by mouth daily.  Dispense: 45 tablet; Refill: 1 -     Valsartan ; Take 1 tablet (160 mg total) by mouth daily.  Dispense: 90 tablet; Refill: 1     Allena Hamilton, MD "

## 2024-08-18 ENCOUNTER — Ambulatory Visit: Payer: Self-pay | Admitting: Internal Medicine

## 2024-08-18 DIAGNOSIS — E7800 Pure hypercholesterolemia, unspecified: Secondary | ICD-10-CM

## 2024-08-18 MED ORDER — ROSUVASTATIN CALCIUM 5 MG PO TABS: 5.0000 mg | ORAL_TABLET | Freq: Every day | ORAL | 2 refills | Status: AC

## 2024-08-18 NOTE — Telephone Encounter (Signed)
 Rx for crestor  5mg  sent to pharmacy. Liver panel ordered. Needs a non fasting lab appt scheduled - make note on lab schedule - liver panel only.

## 2024-08-20 ENCOUNTER — Encounter: Payer: Self-pay | Admitting: Oncology

## 2024-08-21 NOTE — Assessment & Plan Note (Signed)
 Continue tricor . Follow lipid panel.

## 2024-08-21 NOTE — Assessment & Plan Note (Signed)
Avoid antiinflammatories.  Stay hydrated.  Follow metabolic panel.   

## 2024-08-21 NOTE — Assessment & Plan Note (Signed)
 Continues aricept  and namenda. Continues f/u with neurology. Overall stable.

## 2024-08-21 NOTE — Assessment & Plan Note (Signed)
"   Had f/u with cardiology 06/05/23 - f/u regarding syncope. Recommended monitor and echo. Toprol  dose reduced to 25mg  q day. Cardiac monitor showed no arrhythmias. Minimal heart rate 52, max heart rate 113, average heart rate 67. Overall benign cardiac monitor ECHO 06/29/23 - EF 60-65% with grade I DD, moderate elevated PAP, mild to moderate MR and mild to moderate AR. Discussed - per cardiology - no findings to suggest etiology of syncope. Had the brief episode as outlined - Thanksgiving.  Instructed to stay hydrated. Compression hose. Toprol  decreased. Had f/u with cardiology 05/27/14 - f/u presyncope/syncope - improved/resolved with reducing toprol  XL and amlodipine . Cardiac monitor and echo unrevealing.  "

## 2024-08-21 NOTE — Assessment & Plan Note (Signed)
 Followed by hematology

## 2024-08-21 NOTE — Assessment & Plan Note (Signed)
 Seeing Dr Babara 08/02/24 - f/u multiple myeloma - continues daratumumab  monotherapy.

## 2024-08-21 NOTE — Assessment & Plan Note (Signed)
 Saw endocrinology 07/04/24 - f/u multinodular goiter. Repeat thyroid  ultrasound - ok - no new nodules or worrisome features.

## 2024-08-21 NOTE — Assessment & Plan Note (Signed)
 Follow met b and A1c.

## 2024-08-21 NOTE — Assessment & Plan Note (Signed)
 Blood pressure as outlined.  Continue amlodipine , metoprolol , hydrochlorothiazide .  Follow pressures. Follow metabolic panel. No change in medication.

## 2024-08-23 ENCOUNTER — Other Ambulatory Visit: Payer: Self-pay | Admitting: *Deleted

## 2024-08-23 DIAGNOSIS — C9000 Multiple myeloma not having achieved remission: Secondary | ICD-10-CM

## 2024-08-23 MED ORDER — ACYCLOVIR 400 MG PO TABS: 400.0000 mg | ORAL_TABLET | Freq: Two times a day (BID) | ORAL | 11 refills | Status: AC

## 2024-08-23 NOTE — Telephone Encounter (Signed)
 Pt sent mychart msg to request RF

## 2024-08-28 ENCOUNTER — Telehealth: Payer: Self-pay | Admitting: Oncology

## 2024-08-28 NOTE — Telephone Encounter (Signed)
 Due to weather, 2/3 clinic on delayed start time. I called and spoke with pt daughter to confirm new appt times.

## 2024-08-30 ENCOUNTER — Inpatient Hospital Stay: Attending: Oncology

## 2024-08-30 ENCOUNTER — Encounter: Payer: Self-pay | Admitting: Oncology

## 2024-08-30 ENCOUNTER — Inpatient Hospital Stay: Admitting: Oncology

## 2024-08-30 ENCOUNTER — Inpatient Hospital Stay

## 2024-08-30 VITALS — BP 153/66 | HR 63

## 2024-08-30 VITALS — BP 149/73 | HR 66 | Temp 96.8°F | Resp 18 | Wt 159.9 lb

## 2024-08-30 DIAGNOSIS — C9000 Multiple myeloma not having achieved remission: Secondary | ICD-10-CM

## 2024-08-30 DIAGNOSIS — M899 Disorder of bone, unspecified: Secondary | ICD-10-CM

## 2024-08-30 DIAGNOSIS — N1831 Chronic kidney disease, stage 3a: Secondary | ICD-10-CM | POA: Diagnosis not present

## 2024-08-30 DIAGNOSIS — Z5111 Encounter for antineoplastic chemotherapy: Secondary | ICD-10-CM

## 2024-08-30 DIAGNOSIS — D649 Anemia, unspecified: Secondary | ICD-10-CM

## 2024-08-30 DIAGNOSIS — C9001 Multiple myeloma in remission: Secondary | ICD-10-CM

## 2024-08-30 LAB — COMPREHENSIVE METABOLIC PANEL WITH GFR
ALT: 13 U/L (ref 0–44)
AST: 22 U/L (ref 15–41)
Albumin: 3.9 g/dL (ref 3.5–5.0)
Alkaline Phosphatase: 48 U/L (ref 38–126)
Anion gap: 11 (ref 5–15)
BUN: 18 mg/dL (ref 8–23)
CO2: 24 mmol/L (ref 22–32)
Calcium: 9.4 mg/dL (ref 8.9–10.3)
Chloride: 106 mmol/L (ref 98–111)
Creatinine, Ser: 1.05 mg/dL — ABNORMAL HIGH (ref 0.44–1.00)
GFR, Estimated: 51 mL/min — ABNORMAL LOW
Glucose, Bld: 101 mg/dL — ABNORMAL HIGH (ref 70–99)
Potassium: 3.7 mmol/L (ref 3.5–5.1)
Sodium: 141 mmol/L (ref 135–145)
Total Bilirubin: 0.2 mg/dL (ref 0.0–1.2)
Total Protein: 6.5 g/dL (ref 6.5–8.1)

## 2024-08-30 LAB — CBC WITH DIFFERENTIAL/PLATELET
Abs Immature Granulocytes: 0.02 10*3/uL (ref 0.00–0.07)
Basophils Absolute: 0 10*3/uL (ref 0.0–0.1)
Basophils Relative: 1 %
Eosinophils Absolute: 0.1 10*3/uL (ref 0.0–0.5)
Eosinophils Relative: 2 %
HCT: 31.6 % — ABNORMAL LOW (ref 36.0–46.0)
Hemoglobin: 10 g/dL — ABNORMAL LOW (ref 12.0–15.0)
Immature Granulocytes: 0 %
Lymphocytes Relative: 29 %
Lymphs Abs: 1.6 10*3/uL (ref 0.7–4.0)
MCH: 31.2 pg (ref 26.0–34.0)
MCHC: 31.6 g/dL (ref 30.0–36.0)
MCV: 98.4 fL (ref 80.0–100.0)
Monocytes Absolute: 0.2 10*3/uL (ref 0.1–1.0)
Monocytes Relative: 4 %
Neutro Abs: 3.5 10*3/uL (ref 1.7–7.7)
Neutrophils Relative %: 64 %
Platelets: 211 10*3/uL (ref 150–400)
RBC: 3.21 MIL/uL — ABNORMAL LOW (ref 3.87–5.11)
RDW: 12.8 % (ref 11.5–15.5)
WBC: 5.4 10*3/uL (ref 4.0–10.5)
nRBC: 0 % (ref 0.0–0.2)

## 2024-08-30 MED ORDER — DARATUMUMAB-HYALURONIDASE-FIHJ 1800-30000 MG-UT/15ML ~~LOC~~ SOLN
1800.0000 mg | Freq: Once | SUBCUTANEOUS | Status: AC
  Administered 2024-08-30: 1800 mg via SUBCUTANEOUS
  Filled 2024-08-30: qty 15

## 2024-08-30 MED ORDER — ACETAMINOPHEN 325 MG PO TABS: 650.0000 mg | ORAL_TABLET | Freq: Once | ORAL | Status: DC

## 2024-08-30 MED ORDER — DIPHENHYDRAMINE HCL 25 MG PO TABS: 25.0000 mg | ORAL_TABLET | Freq: Once | ORAL | Status: DC

## 2024-08-30 NOTE — Assessment & Plan Note (Signed)
 04/20/23 PET scan whole body,  Patchy hypermetabolism throughout the spine with focal hypermetabolism in the right third rib and left sacrum, compatible with residual metabolically active multiple myeloma.  Continue Q3 months Zometa - today and next due April 2026  Recommend calcium 1200 mg daily.

## 2024-08-30 NOTE — Assessment & Plan Note (Signed)
recommend patient to avoid nephrotoxin.  Encourage oral hydration. 

## 2024-08-30 NOTE — Patient Instructions (Signed)
 [facility] - A DEPT OF [facility]  Discharge Instructions: Thank you for choosing [facility] to provide your oncology and hematology care.  If you have a lab appointment with the Cancer Center, please go directly to the Cancer Center and check in at the registration area.  Wear comfortable clothing and clothing appropriate for easy access to any Portacath or PICC line.   We strive to give you quality time with your provider. You may need to reschedule your appointment if you arrive late (15 or more minutes).  Arriving late affects you and other patients whose appointments are after yours.  Also, if you miss three or more appointments without notifying the office, you may be dismissed from the clinic at the provider's discretion.      For prescription refill requests, have your pharmacy contact our office and allow 72 hours for refills to be completed.    Today you received the following chemotherapy and/or immunotherapy agents :   daratumumab     To help prevent nausea and vomiting after your treatment, we encourage you to take your nausea medication as directed.  BELOW ARE SYMPTOMS THAT SHOULD BE REPORTED IMMEDIATELY: *FEVER GREATER THAN 100.4 F (38 C) OR HIGHER *CHILLS OR SWEATING *NAUSEA AND VOMITING THAT IS NOT CONTROLLED WITH YOUR NAUSEA MEDICATION *UNUSUAL SHORTNESS OF BREATH *UNUSUAL BRUISING OR BLEEDING *URINARY PROBLEMS (pain or burning when urinating, or frequent urination) *BOWEL PROBLEMS (unusual diarrhea, constipation, pain near the anus) TENDERNESS IN MOUTH AND THROAT WITH OR WITHOUT PRESENCE OF ULCERS (sore throat, sores in mouth, or a toothache) UNUSUAL RASH, SWELLING OR PAIN  UNUSUAL VAGINAL DISCHARGE OR ITCHING   Items with * indicate a potential emergency and should be followed up as soon as possible or go to the Emergency Department if any problems should occur.  Please show the CHEMOTHERAPY ALERT CARD or  IMMUNOTHERAPY ALERT CARD at check-in to the Emergency Department and triage nurse.  Should you have questions after your visit or need to cancel or reschedule your appointment, please contact [facility] - A DEPT OF [facility]  [phone] and follow the prompts.  Office hours are 8:00 a.m. to 4:30 p.m. Monday - Friday. Please note that voicemails left after 4:00 p.m. may not be returned until the following business day.  We are closed weekends and major holidays. You have access to a nurse at all times for urgent questions. Please call the main number to the clinic [phone] and follow the prompts.  For any non-urgent questions, you may also contact your provider using MyChart. We now offer e-Visits for anyone 25 and older to request care online for non-urgent symptoms. For details visit [email or web address].   Also download the MyChart app! Go to the app store, search MyChart, open the app, select [place], and log in with your MyChart username and password.

## 2024-08-30 NOTE — Assessment & Plan Note (Signed)
Chemotherapy plan as listed as above. 

## 2024-08-30 NOTE — Assessment & Plan Note (Signed)
 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  Lab Results  Component Value Date   HGB 10.0 (L) 08/30/2024   TIBC 448 08/02/2024   IRONPCTSAT 18 08/02/2024   FERRITIN 98 08/02/2024    Recommend patient to take oral iron  supplementation Vitron-C 1 tab daily.

## 2024-08-30 NOTE — Assessment & Plan Note (Addendum)
 Multiple myeloma, IgA kappa, complex hyperploid gain of 1q, S/p Daratumumab  Rd.   Revlimid  D1-21,  07/23/22 bone marrow biopsy  3% plasma cell, no M protein.   Labs are reviewed and discussed with patient.  Lab Results  Component Value Date   MPROTEIN 0.6 (H) 08/02/2024   KPAFRELGTCHN 11.4 08/02/2024   LAMBDASER 6.8 08/02/2024   KAPLAMBRATIO 1.68 (H) 08/02/2024     M protein increased to  0.6  Stable light chain ratio.  Proceed with  Daratumumab  monotherapy maintenance - she takes home supply of Dexamethasone  prior to her Daratumumab .  On asprin 81mg  daily and Acyclovir  400mg  BID.

## 2024-08-31 LAB — KAPPA/LAMBDA LIGHT CHAINS
Kappa free light chain: 11.8 mg/L (ref 3.3–19.4)
Kappa, lambda light chain ratio: 1.97 — ABNORMAL HIGH (ref 0.26–1.65)
Lambda free light chains: 6 mg/L (ref 5.7–26.3)

## 2024-09-01 LAB — MULTIPLE MYELOMA PANEL, SERUM
Albumin SerPl Elph-Mcnc: 3.3 g/dL (ref 2.9–4.4)
Albumin/Glob SerPl: 1.3 (ref 0.7–1.7)
Alpha 1: 0.3 g/dL (ref 0.0–0.4)
Alpha2 Glob SerPl Elph-Mcnc: 0.7 g/dL (ref 0.4–1.0)
B-Globulin SerPl Elph-Mcnc: 1.4 g/dL — ABNORMAL HIGH (ref 0.7–1.3)
Gamma Glob SerPl Elph-Mcnc: 0.4 g/dL (ref 0.4–1.8)
Globulin, Total: 2.7 g/dL (ref 2.2–3.9)
IgA: 345 mg/dL (ref 64–422)
IgG (Immunoglobin G), Serum: 537 mg/dL — ABNORMAL LOW (ref 586–1602)
IgM (Immunoglobulin M), Srm: 23 mg/dL — ABNORMAL LOW (ref 26–217)
M Protein SerPl Elph-Mcnc: 0.3 g/dL — ABNORMAL HIGH
Total Protein ELP: 6 g/dL (ref 6.0–8.5)

## 2024-09-27 ENCOUNTER — Inpatient Hospital Stay: Admitting: Oncology

## 2024-09-27 ENCOUNTER — Inpatient Hospital Stay

## 2024-10-03 ENCOUNTER — Other Ambulatory Visit

## 2024-12-09 ENCOUNTER — Ambulatory Visit (INDEPENDENT_AMBULATORY_CARE_PROVIDER_SITE_OTHER): Admitting: Nurse Practitioner

## 2024-12-16 ENCOUNTER — Ambulatory Visit: Admitting: Internal Medicine

## 2025-01-02 ENCOUNTER — Ambulatory Visit: Admitting: Internal Medicine
